# Patient Record
Sex: Female | Born: 1945 | Race: White | Hispanic: No | Marital: Married | Smoking: Current every day smoker
Health system: Southern US, Community
[De-identification: ages and names within clinical notes are randomized; demographics above are authoritative.]

## PROBLEM LIST (undated history)

## (undated) DIAGNOSIS — R188 Other ascites: Secondary | ICD-10-CM

## (undated) DIAGNOSIS — Z8579 Personal history of other malignant neoplasms of lymphoid, hematopoietic and related tissues: Secondary | ICD-10-CM

## (undated) DIAGNOSIS — M1990 Unspecified osteoarthritis, unspecified site: Secondary | ICD-10-CM

## (undated) DIAGNOSIS — J189 Pneumonia, unspecified organism: Secondary | ICD-10-CM

## (undated) DIAGNOSIS — K7460 Unspecified cirrhosis of liver: Secondary | ICD-10-CM

## (undated) DIAGNOSIS — T4145XA Adverse effect of unspecified anesthetic, initial encounter: Secondary | ICD-10-CM

## (undated) DIAGNOSIS — S32050K Wedge compression fracture of fifth lumbar vertebra, subsequent encounter for fracture with nonunion: Secondary | ICD-10-CM

## (undated) DIAGNOSIS — K8020 Calculus of gallbladder without cholecystitis without obstruction: Secondary | ICD-10-CM

## (undated) DIAGNOSIS — Z86010 Personal history of colonic polyps: Secondary | ICD-10-CM

## (undated) DIAGNOSIS — D369 Benign neoplasm, unspecified site: Secondary | ICD-10-CM

## (undated) DIAGNOSIS — S32000A Wedge compression fracture of unspecified lumbar vertebra, initial encounter for closed fracture: Secondary | ICD-10-CM

## (undated) DIAGNOSIS — D539 Nutritional anemia, unspecified: Secondary | ICD-10-CM

## (undated) DIAGNOSIS — I700 Atherosclerosis of aorta: Secondary | ICD-10-CM

## (undated) DIAGNOSIS — M810 Age-related osteoporosis without current pathological fracture: Secondary | ICD-10-CM

## (undated) DIAGNOSIS — Z720 Tobacco use: Secondary | ICD-10-CM

## (undated) DIAGNOSIS — M4850XA Collapsed vertebra, not elsewhere classified, site unspecified, initial encounter for fracture: Secondary | ICD-10-CM

## (undated) DIAGNOSIS — F1010 Alcohol abuse, uncomplicated: Secondary | ICD-10-CM

## (undated) DIAGNOSIS — T8859XA Other complications of anesthesia, initial encounter: Secondary | ICD-10-CM

## (undated) DIAGNOSIS — E871 Hypo-osmolality and hyponatremia: Secondary | ICD-10-CM

## (undated) DIAGNOSIS — J439 Emphysema, unspecified: Secondary | ICD-10-CM

## (undated) DIAGNOSIS — J982 Interstitial emphysema: Secondary | ICD-10-CM

## (undated) HISTORY — DX: Personal history of colonic polyps: Z86.010

## (undated) HISTORY — DX: Personal history of other malignant neoplasms of lymphoid, hematopoietic and related tissues: Z85.79

## (undated) HISTORY — PX: TONSILLECTOMY: SUR1361

## (undated) HISTORY — PX: MANDIBLE FRACTURE SURGERY: SHX706

## (undated) HISTORY — DX: Tobacco use: Z72.0

## (undated) HISTORY — DX: Hypo-osmolality and hyponatremia: E87.1

## (undated) HISTORY — DX: Wedge compression fracture of fifth lumbar vertebra, subsequent encounter for fracture with nonunion: S32.050K

## (undated) HISTORY — DX: Benign neoplasm, unspecified site: D36.9

## (undated) HISTORY — DX: Collapsed vertebra, not elsewhere classified, site unspecified, initial encounter for fracture: M48.50XA

## (undated) HISTORY — DX: Interstitial emphysema: J98.2

---

## 2000-01-16 ENCOUNTER — Encounter: Payer: Self-pay | Admitting: Internal Medicine

## 2000-01-16 ENCOUNTER — Ambulatory Visit (HOSPITAL_COMMUNITY): Admission: RE | Admit: 2000-01-16 | Discharge: 2000-01-16 | Payer: Self-pay | Admitting: Internal Medicine

## 2001-02-28 ENCOUNTER — Other Ambulatory Visit: Admission: RE | Admit: 2001-02-28 | Discharge: 2001-03-19 | Payer: Self-pay

## 2003-09-18 ENCOUNTER — Other Ambulatory Visit: Admission: RE | Admit: 2003-09-18 | Discharge: 2003-09-18 | Payer: Self-pay | Admitting: Obstetrics and Gynecology

## 2003-09-21 ENCOUNTER — Encounter: Admission: RE | Admit: 2003-09-21 | Discharge: 2003-09-21 | Payer: Self-pay | Admitting: Obstetrics and Gynecology

## 2003-10-18 ENCOUNTER — Other Ambulatory Visit: Admission: RE | Admit: 2003-10-18 | Discharge: 2003-10-18 | Payer: Self-pay | Admitting: Obstetrics and Gynecology

## 2004-09-23 ENCOUNTER — Other Ambulatory Visit: Admission: RE | Admit: 2004-09-23 | Discharge: 2004-09-23 | Payer: Self-pay | Admitting: Obstetrics and Gynecology

## 2004-10-02 ENCOUNTER — Encounter: Admission: RE | Admit: 2004-10-02 | Discharge: 2004-10-02 | Payer: Self-pay | Admitting: Obstetrics and Gynecology

## 2005-04-29 ENCOUNTER — Other Ambulatory Visit: Admission: RE | Admit: 2005-04-29 | Discharge: 2005-04-29 | Payer: Self-pay | Admitting: Obstetrics and Gynecology

## 2006-04-27 ENCOUNTER — Other Ambulatory Visit: Admission: RE | Admit: 2006-04-27 | Discharge: 2006-04-27 | Payer: Self-pay | Admitting: Obstetrics and Gynecology

## 2006-12-08 ENCOUNTER — Other Ambulatory Visit: Admission: RE | Admit: 2006-12-08 | Discharge: 2006-12-08 | Payer: Self-pay | Admitting: Obstetrics and Gynecology

## 2007-09-14 ENCOUNTER — Other Ambulatory Visit: Admission: RE | Admit: 2007-09-14 | Discharge: 2007-09-14 | Payer: Self-pay | Admitting: Obstetrics and Gynecology

## 2009-05-22 ENCOUNTER — Encounter: Admission: RE | Admit: 2009-05-22 | Discharge: 2009-05-22 | Payer: Self-pay | Admitting: Obstetrics and Gynecology

## 2014-04-26 DIAGNOSIS — J189 Pneumonia, unspecified organism: Secondary | ICD-10-CM

## 2014-04-26 HISTORY — DX: Pneumonia, unspecified organism: J18.9

## 2014-05-09 DIAGNOSIS — Z1329 Encounter for screening for other suspected endocrine disorder: Secondary | ICD-10-CM | POA: Diagnosis not present

## 2014-05-09 DIAGNOSIS — E559 Vitamin D deficiency, unspecified: Secondary | ICD-10-CM | POA: Diagnosis not present

## 2014-05-09 DIAGNOSIS — J209 Acute bronchitis, unspecified: Secondary | ICD-10-CM | POA: Diagnosis not present

## 2014-05-09 DIAGNOSIS — K7689 Other specified diseases of liver: Secondary | ICD-10-CM | POA: Diagnosis not present

## 2014-05-09 DIAGNOSIS — Z136 Encounter for screening for cardiovascular disorders: Secondary | ICD-10-CM | POA: Diagnosis not present

## 2014-05-09 DIAGNOSIS — Z780 Asymptomatic menopausal state: Secondary | ICD-10-CM | POA: Diagnosis not present

## 2014-05-09 DIAGNOSIS — Z0001 Encounter for general adult medical examination with abnormal findings: Secondary | ICD-10-CM | POA: Diagnosis not present

## 2014-05-09 DIAGNOSIS — Z23 Encounter for immunization: Secondary | ICD-10-CM | POA: Diagnosis not present

## 2014-05-14 ENCOUNTER — Inpatient Hospital Stay (HOSPITAL_COMMUNITY): Payer: Medicare Other

## 2014-05-14 ENCOUNTER — Other Ambulatory Visit: Payer: Self-pay | Admitting: *Deleted

## 2014-05-14 ENCOUNTER — Emergency Department (HOSPITAL_COMMUNITY): Payer: Medicare Other

## 2014-05-14 ENCOUNTER — Encounter (HOSPITAL_COMMUNITY): Payer: Self-pay | Admitting: Emergency Medicine

## 2014-05-14 ENCOUNTER — Inpatient Hospital Stay (HOSPITAL_COMMUNITY)
Admission: EM | Admit: 2014-05-14 | Discharge: 2014-05-25 | DRG: 987 | Disposition: A | Discharge status: Home / Self Care | Payer: Medicare Other | Attending: Internal Medicine | Admitting: Internal Medicine

## 2014-05-14 DIAGNOSIS — E43 Unspecified severe protein-calorie malnutrition: Secondary | ICD-10-CM | POA: Diagnosis not present

## 2014-05-14 DIAGNOSIS — K766 Portal hypertension: Secondary | ICD-10-CM | POA: Diagnosis present

## 2014-05-14 DIAGNOSIS — R933 Abnormal findings on diagnostic imaging of other parts of digestive tract: Secondary | ICD-10-CM | POA: Diagnosis not present

## 2014-05-14 DIAGNOSIS — F1010 Alcohol abuse, uncomplicated: Secondary | ICD-10-CM | POA: Diagnosis present

## 2014-05-14 DIAGNOSIS — J439 Emphysema, unspecified: Secondary | ICD-10-CM | POA: Diagnosis not present

## 2014-05-14 DIAGNOSIS — Z720 Tobacco use: Secondary | ICD-10-CM | POA: Diagnosis present

## 2014-05-14 DIAGNOSIS — K7011 Alcoholic hepatitis with ascites: Secondary | ICD-10-CM | POA: Diagnosis present

## 2014-05-14 DIAGNOSIS — Z780 Asymptomatic menopausal state: Secondary | ICD-10-CM

## 2014-05-14 DIAGNOSIS — R7989 Other specified abnormal findings of blood chemistry: Secondary | ICD-10-CM | POA: Diagnosis not present

## 2014-05-14 DIAGNOSIS — K6389 Other specified diseases of intestine: Secondary | ICD-10-CM | POA: Diagnosis not present

## 2014-05-14 DIAGNOSIS — K621 Rectal polyp: Secondary | ICD-10-CM | POA: Diagnosis not present

## 2014-05-14 DIAGNOSIS — J189 Pneumonia, unspecified organism: Principal | ICD-10-CM | POA: Diagnosis present

## 2014-05-14 DIAGNOSIS — E860 Dehydration: Secondary | ICD-10-CM | POA: Diagnosis present

## 2014-05-14 DIAGNOSIS — J449 Chronic obstructive pulmonary disease, unspecified: Secondary | ICD-10-CM | POA: Diagnosis present

## 2014-05-14 DIAGNOSIS — M810 Age-related osteoporosis without current pathological fracture: Secondary | ICD-10-CM | POA: Diagnosis present

## 2014-05-14 DIAGNOSIS — K5730 Diverticulosis of large intestine without perforation or abscess without bleeding: Secondary | ICD-10-CM | POA: Diagnosis present

## 2014-05-14 DIAGNOSIS — J438 Other emphysema: Secondary | ICD-10-CM

## 2014-05-14 DIAGNOSIS — K7031 Alcoholic cirrhosis of liver with ascites: Secondary | ICD-10-CM | POA: Diagnosis not present

## 2014-05-14 DIAGNOSIS — D500 Iron deficiency anemia secondary to blood loss (chronic): Secondary | ICD-10-CM | POA: Diagnosis present

## 2014-05-14 DIAGNOSIS — D638 Anemia in other chronic diseases classified elsewhere: Secondary | ICD-10-CM | POA: Diagnosis present

## 2014-05-14 DIAGNOSIS — K7030 Alcoholic cirrhosis of liver without ascites: Secondary | ICD-10-CM | POA: Diagnosis present

## 2014-05-14 DIAGNOSIS — I2510 Atherosclerotic heart disease of native coronary artery without angina pectoris: Secondary | ICD-10-CM | POA: Diagnosis not present

## 2014-05-14 DIAGNOSIS — R05 Cough: Secondary | ICD-10-CM | POA: Diagnosis not present

## 2014-05-14 DIAGNOSIS — J440 Chronic obstructive pulmonary disease with acute lower respiratory infection: Secondary | ICD-10-CM | POA: Diagnosis not present

## 2014-05-14 DIAGNOSIS — R0602 Shortness of breath: Secondary | ICD-10-CM | POA: Diagnosis not present

## 2014-05-14 DIAGNOSIS — E876 Hypokalemia: Secondary | ICD-10-CM | POA: Diagnosis present

## 2014-05-14 DIAGNOSIS — J441 Chronic obstructive pulmonary disease with (acute) exacerbation: Secondary | ICD-10-CM | POA: Diagnosis present

## 2014-05-14 DIAGNOSIS — J984 Other disorders of lung: Secondary | ICD-10-CM | POA: Diagnosis not present

## 2014-05-14 DIAGNOSIS — R601 Generalized edema: Secondary | ICD-10-CM | POA: Diagnosis present

## 2014-05-14 DIAGNOSIS — E8770 Fluid overload, unspecified: Secondary | ICD-10-CM | POA: Diagnosis present

## 2014-05-14 DIAGNOSIS — E871 Hypo-osmolality and hyponatremia: Secondary | ICD-10-CM | POA: Diagnosis present

## 2014-05-14 DIAGNOSIS — R188 Other ascites: Secondary | ICD-10-CM

## 2014-05-14 DIAGNOSIS — R748 Abnormal levels of other serum enzymes: Secondary | ICD-10-CM | POA: Diagnosis not present

## 2014-05-14 DIAGNOSIS — D128 Benign neoplasm of rectum: Secondary | ICD-10-CM | POA: Diagnosis not present

## 2014-05-14 DIAGNOSIS — D62 Acute posthemorrhagic anemia: Secondary | ICD-10-CM | POA: Diagnosis present

## 2014-05-14 DIAGNOSIS — N3289 Other specified disorders of bladder: Secondary | ICD-10-CM | POA: Diagnosis not present

## 2014-05-14 DIAGNOSIS — J90 Pleural effusion, not elsewhere classified: Secondary | ICD-10-CM | POA: Diagnosis present

## 2014-05-14 DIAGNOSIS — R609 Edema, unspecified: Secondary | ICD-10-CM | POA: Diagnosis not present

## 2014-05-14 DIAGNOSIS — I519 Heart disease, unspecified: Secondary | ICD-10-CM | POA: Diagnosis not present

## 2014-05-14 DIAGNOSIS — I8510 Secondary esophageal varices without bleeding: Secondary | ICD-10-CM | POA: Diagnosis present

## 2014-05-14 DIAGNOSIS — F17210 Nicotine dependence, cigarettes, uncomplicated: Secondary | ICD-10-CM | POA: Diagnosis present

## 2014-05-14 DIAGNOSIS — R945 Abnormal results of liver function studies: Secondary | ICD-10-CM

## 2014-05-14 DIAGNOSIS — Z452 Encounter for adjustment and management of vascular access device: Secondary | ICD-10-CM | POA: Diagnosis not present

## 2014-05-14 DIAGNOSIS — K8020 Calculus of gallbladder without cholecystitis without obstruction: Secondary | ICD-10-CM | POA: Diagnosis not present

## 2014-05-14 DIAGNOSIS — J9811 Atelectasis: Secondary | ICD-10-CM | POA: Diagnosis not present

## 2014-05-14 DIAGNOSIS — K5790 Diverticulosis of intestine, part unspecified, without perforation or abscess without bleeding: Secondary | ICD-10-CM | POA: Diagnosis not present

## 2014-05-14 DIAGNOSIS — K740 Hepatic fibrosis: Secondary | ICD-10-CM | POA: Diagnosis not present

## 2014-05-14 DIAGNOSIS — D125 Benign neoplasm of sigmoid colon: Secondary | ICD-10-CM | POA: Diagnosis present

## 2014-05-14 DIAGNOSIS — J9601 Acute respiratory failure with hypoxia: Secondary | ICD-10-CM | POA: Diagnosis not present

## 2014-05-14 DIAGNOSIS — D72829 Elevated white blood cell count, unspecified: Secondary | ICD-10-CM | POA: Diagnosis not present

## 2014-05-14 DIAGNOSIS — R062 Wheezing: Secondary | ICD-10-CM | POA: Diagnosis not present

## 2014-05-14 DIAGNOSIS — R404 Transient alteration of awareness: Secondary | ICD-10-CM | POA: Diagnosis not present

## 2014-05-14 HISTORY — DX: Emphysema, unspecified: J43.9

## 2014-05-14 HISTORY — DX: Calculus of gallbladder without cholecystitis without obstruction: K80.20

## 2014-05-14 HISTORY — DX: Other ascites: R18.8

## 2014-05-14 HISTORY — DX: Age-related osteoporosis without current pathological fracture: M81.0

## 2014-05-14 HISTORY — DX: Generalized edema: R60.1

## 2014-05-14 HISTORY — DX: Atherosclerosis of aorta: I70.0

## 2014-05-14 HISTORY — DX: Wedge compression fracture of unspecified lumbar vertebra, initial encounter for closed fracture: S32.000A

## 2014-05-14 HISTORY — DX: Alcohol abuse, uncomplicated: F10.10

## 2014-05-14 HISTORY — DX: Nutritional anemia, unspecified: D53.9

## 2014-05-14 HISTORY — DX: Unspecified cirrhosis of liver: K74.60

## 2014-05-14 LAB — BASIC METABOLIC PANEL
Anion gap: 8 (ref 5–15)
Anion gap: 9 (ref 5–15)
BUN: 7 mg/dL (ref 6–23)
BUN: 7 mg/dL (ref 6–23)
CALCIUM: 7.5 mg/dL — AB (ref 8.4–10.5)
CO2: 38 mEq/L — ABNORMAL HIGH (ref 19–32)
CO2: 37 mEq/L — ABNORMAL HIGH (ref 19–32)
CREATININE: 0.39 mg/dL — AB (ref 0.50–1.10)
CREATININE: 0.34 mg/dL — AB (ref 0.50–1.10)
Calcium: 7.7 mg/dL — ABNORMAL LOW (ref 8.4–10.5)
Chloride: 65 mEq/L — ABNORMAL LOW (ref 96–112)
Chloride: 65 mEq/L — ABNORMAL LOW (ref 96–112)
GFR calc Af Amer: >90 mL/min (ref >90)
GLUCOSE: 132 mg/dL — AB (ref 70–99)
Glucose, Bld: 119 mg/dL — ABNORMAL HIGH (ref 70–99)
Potassium: 2.5 mEq/L — CL (ref 3.7–5.3)
Potassium: 2.4 mEq/L — CL (ref 3.7–5.3)
Sodium: 111 mEq/L — CL (ref 137–147)
Sodium: 111 mEq/L — CL (ref 137–147)

## 2014-05-14 LAB — CBC WITH DIFFERENTIAL/PLATELET
Basophils Absolute: 0 10*3/uL (ref 0.0–0.1)
Basophils Relative: 0 % (ref 0–1)
EOS ABS: 0 10*3/uL (ref 0.0–0.7)
Eosinophils Relative: 0 % (ref 0–5)
HEMATOCRIT: 30.5 % — AB (ref 36.0–46.0)
HEMOGLOBIN: 11 g/dL — AB (ref 12.0–15.0)
LYMPHS ABS: 0.6 10*3/uL — AB (ref 0.7–4.0)
Lymphocytes Relative: 3 % — ABNORMAL LOW (ref 12–46)
MCH: 37 pg — AB (ref 26.0–34.0)
MCHC: 36.1 g/dL — AB (ref 30.0–36.0)
MCV: 102.7 fL — AB (ref 78.0–100.0)
MONO ABS: 0.7 10*3/uL (ref 0.1–1.0)
MONOS PCT: 3 % (ref 3–12)
NEUTROS PCT: 94 % — AB (ref 43–77)
Neutro Abs: 20.4 10*3/uL — ABNORMAL HIGH (ref 1.7–7.7)
Platelets: 452 10*3/uL — ABNORMAL HIGH (ref 150–400)
RBC: 2.97 MIL/uL — AB (ref 3.87–5.11)
RDW: 13 % (ref 11.5–15.5)
WBC: 21.7 10*3/uL — ABNORMAL HIGH (ref 4.0–10.5)

## 2014-05-14 LAB — URINALYSIS, ROUTINE W REFLEX MICROSCOPIC
BILIRUBIN URINE: NEGATIVE
Glucose, UA: NEGATIVE mg/dL
HGB URINE DIPSTICK: NEGATIVE
Ketones, ur: NEGATIVE mg/dL
Leukocytes, UA: NEGATIVE
Nitrite: NEGATIVE
PROTEIN: NEGATIVE mg/dL
Specific Gravity, Urine: 1.026 (ref 1.005–1.030)
Urobilinogen, UA: 4 mg/dL — ABNORMAL HIGH (ref 0.0–1.0)
pH: 8 (ref 5.0–8.0)

## 2014-05-14 LAB — PRO B NATRIURETIC PEPTIDE: Pro B Natriuretic peptide (BNP): 297.9 pg/mL — ABNORMAL HIGH (ref 0–125)

## 2014-05-14 LAB — HEPATIC FUNCTION PANEL
ALBUMIN: 1.7 g/dL — AB (ref 3.5–5.2)
ALT: 27 U/L (ref 0–35)
AST: 117 U/L — ABNORMAL HIGH (ref 0–37)
Alkaline Phosphatase: 225 U/L — ABNORMAL HIGH (ref 39–117)
BILIRUBIN TOTAL: 0.5 mg/dL (ref 0.3–1.2)
Bilirubin, Direct: 0.2 mg/dL (ref 0.0–0.3)
Indirect Bilirubin: 0.3 mg/dL (ref 0.3–0.9)
Total Protein: 5.9 g/dL — ABNORMAL LOW (ref 6.0–8.3)

## 2014-05-14 LAB — MRSA PCR SCREENING: MRSA by PCR: NEGATIVE

## 2014-05-14 LAB — I-STAT CG4 LACTIC ACID, ED: Lactic Acid, Venous: 2.05 mmol/L (ref 0.5–2.2)

## 2014-05-14 LAB — PROTIME-INR
INR: 1.12 (ref 0.00–1.49)
PROTHROMBIN TIME: 14.5 s (ref 11.6–15.2)

## 2014-05-14 MED ORDER — LORAZEPAM 2 MG/ML IJ SOLN: 1.0000 mg | Freq: Four times a day (QID) | INTRAMUSCULAR | Status: AC | PRN

## 2014-05-14 MED ORDER — ENOXAPARIN SODIUM 30 MG/0.3ML ~~LOC~~ SOLN
30.0000 mg | SUBCUTANEOUS | Status: DC
  Administered 2014-05-14 – 2014-05-15 (×2): 30 mg via SUBCUTANEOUS
  Filled 2014-05-14 (×2): qty 0.3

## 2014-05-14 MED ORDER — THIAMINE HCL 100 MG/ML IJ SOLN
100.0000 mg | Freq: Every day | INTRAMUSCULAR | Status: DC
  Filled 2014-05-14 (×2): qty 1
  Filled 2014-05-14: qty 2
  Filled 2014-05-14 (×4): qty 1

## 2014-05-14 MED ORDER — ACETAMINOPHEN 325 MG PO TABS
650.0000 mg | ORAL_TABLET | Freq: Four times a day (QID) | ORAL | Status: DC | PRN
  Administered 2014-05-15 – 2014-05-24 (×7): 650 mg via ORAL
  Filled 2014-05-14 (×7): qty 2

## 2014-05-14 MED ORDER — SODIUM CHLORIDE 0.9 % IJ SOLN: 3.0000 mL | INTRAMUSCULAR | Status: DC | PRN

## 2014-05-14 MED ORDER — ADULT MULTIVITAMIN W/MINERALS CH
1.0000 | ORAL_TABLET | Freq: Every day | ORAL | Status: DC
  Administered 2014-05-14 – 2014-05-25 (×12): 1 via ORAL
  Filled 2014-05-14 (×12): qty 1

## 2014-05-14 MED ORDER — ACETAMINOPHEN 650 MG RE SUPP: 650.0000 mg | Freq: Four times a day (QID) | RECTAL | Status: DC | PRN

## 2014-05-14 MED ORDER — DEXTROSE 5 % IV SOLN
500.0000 mg | Freq: Once | INTRAVENOUS | Status: DC
  Filled 2014-05-14: qty 500

## 2014-05-14 MED ORDER — FOLIC ACID 1 MG PO TABS
1.0000 mg | ORAL_TABLET | Freq: Every day | ORAL | Status: DC
  Administered 2014-05-14 – 2014-05-25 (×12): 1 mg via ORAL
  Filled 2014-05-14 (×12): qty 1

## 2014-05-14 MED ORDER — IPRATROPIUM-ALBUTEROL 0.5-2.5 (3) MG/3ML IN SOLN
3.0000 mL | Freq: Four times a day (QID) | RESPIRATORY_TRACT | Status: DC
  Administered 2014-05-14 – 2014-05-15 (×3): 3 mL via RESPIRATORY_TRACT
  Filled 2014-05-14 (×3): qty 3

## 2014-05-14 MED ORDER — POTASSIUM CHLORIDE 10 MEQ/100ML IV SOLN
10.0000 meq | INTRAVENOUS | Status: AC
  Administered 2014-05-14 – 2014-05-15 (×4): 10 meq via INTRAVENOUS
  Filled 2014-05-14: qty 100

## 2014-05-14 MED ORDER — DEXTROSE 5 % IV SOLN
1.0000 g | Freq: Once | INTRAVENOUS | Status: AC
  Administered 2014-05-14: 1 g via INTRAVENOUS
  Filled 2014-05-14: qty 10

## 2014-05-14 MED ORDER — FUROSEMIDE 10 MG/ML IJ SOLN
20.0000 mg | Freq: Three times a day (TID) | INTRAMUSCULAR | Status: DC
Start: 2014-05-14 — End: 2014-05-16
  Administered 2014-05-14 – 2014-05-16 (×5): 20 mg via INTRAVENOUS
  Filled 2014-05-14 (×5): qty 2

## 2014-05-14 MED ORDER — OXYCODONE HCL 5 MG PO TABS
5.0000 mg | ORAL_TABLET | ORAL | Status: DC | PRN
Start: 2014-05-14 — End: 2014-05-25

## 2014-05-14 MED ORDER — ONDANSETRON HCL 4 MG/2ML IJ SOLN: 4.0000 mg | Freq: Four times a day (QID) | INTRAMUSCULAR | Status: DC | PRN

## 2014-05-14 MED ORDER — SODIUM CHLORIDE 0.9 % IV SOLN: 250.0000 mL | INTRAVENOUS | Status: DC | PRN

## 2014-05-14 MED ORDER — IOHEXOL 300 MG/ML  SOLN
100.0000 mL | Freq: Once | INTRAMUSCULAR | Status: AC | PRN
  Administered 2014-05-14: 100 mL via INTRAVENOUS

## 2014-05-14 MED ORDER — SODIUM CHLORIDE 0.9 % IV SOLN
INTRAVENOUS | Status: DC
  Administered 2014-05-14: 18:00:00 via INTRAVENOUS

## 2014-05-14 MED ORDER — LORAZEPAM 1 MG PO TABS: 1.0000 mg | ORAL_TABLET | Freq: Four times a day (QID) | ORAL | Status: AC | PRN

## 2014-05-14 MED ORDER — GUAIFENESIN-DM 100-10 MG/5ML PO SYRP: 5.0000 mL | ORAL_SOLUTION | ORAL | Status: DC | PRN

## 2014-05-14 MED ORDER — ONDANSETRON HCL 4 MG PO TABS: 4.0000 mg | ORAL_TABLET | Freq: Four times a day (QID) | ORAL | Status: DC | PRN

## 2014-05-14 MED ORDER — VITAMIN B-1 100 MG PO TABS
100.0000 mg | ORAL_TABLET | Freq: Every day | ORAL | Status: DC
  Administered 2014-05-14 – 2014-05-25 (×12): 100 mg via ORAL
  Filled 2014-05-14 (×12): qty 1

## 2014-05-14 MED ORDER — SODIUM CHLORIDE 0.9 % IJ SOLN
3.0000 mL | Freq: Two times a day (BID) | INTRAMUSCULAR | Status: DC
  Administered 2014-05-14 – 2014-05-17 (×3): 3 mL via INTRAVENOUS

## 2014-05-14 MED ORDER — ALBUTEROL SULFATE (2.5 MG/3ML) 0.083% IN NEBU: 2.5000 mg | INHALATION_SOLUTION | RESPIRATORY_TRACT | Status: DC | PRN

## 2014-05-14 MED ORDER — MORPHINE SULFATE 2 MG/ML IJ SOLN: 1.0000 mg | INTRAMUSCULAR | Status: DC | PRN

## 2014-05-14 NOTE — ED Notes (Signed)
Pt placed on 2L of oxygen. Sat around 90% before O2 applied. After O2, 96%.

## 2014-05-14 NOTE — ED Notes (Signed)
Attempted to called report, RN unable to take report at this time.

## 2014-05-14 NOTE — H&P (Addendum)
PATIENT DETAILS Name: Cassidy Barrett Age: 68 y.o. Sex: female Date of Birth: [birth year] Admit Date: 05/14/2014 MWN:[id number], Luane School, NP   CHIEF COMPLAINT:  Sent from PCPs office for evaluation of possible pneumonia, electrolyte abnormalities   HPI: Cassidy Barrett is a 68 y.o. female with a Past Medical History of ongoing tobacco abuse, EtOH use who presents today with the above noted complaint. Approximately 3 weeks back, patient had cough/cold like symptoms, following which she had loose stool (but not diarrhea- as only once a day). Also started getting weak, with significantly decreased appetite. Patient has been trying to keep her appetite up with Gatorade, gingerale and water- approximate intake of around 2.5-3 L a day (plus beer).There is no history of nausea vomiting. She went to her primary care practitioner's office on 10/14, and was found to be wheezing, and was started on Zithromax and prednisone taper. A chest x-ray done on 10/14 also showed right-sided pleural effusion, and a questionable right-sided mass. Electrolytes done on 10/14 showed a sodium level of 125. She continued to have weakness, cough, she was referred to the emergency room for further evaluation and treatment. X-ray of the chest done on 10/19 shows right-sided pleural effusion, last seen on 10/19 in the emergency room showed a sodium of 111, potassium of 2.5. I was subsequently asked to admit this patient for further evaluation and treatment. Patient denies any history of fever at home. She does have chronic cough, but her current cough is worse than her usual cough. Family has noted, some mild leg edema, and some sacral edema/lower back edema as well. Per ED RN, patient has had approximately 200 cc of IV fluids, and a bag of IV Rocephin while in the emergency room.   ALLERGIES:  No Known Allergies  PAST MEDICAL HISTORY: Past Medical History  Diagnosis Date  . Osteoporosis     PAST SURGICAL  HISTORY: Past Surgical History  Procedure Laterality Date  . Tonsillectomy      MEDICATIONS AT HOME: Prior to Admission medications   Medication Sig Start Date End Date Taking? Authorizing Provider  albuterol (PROVENTIL HFA;VENTOLIN HFA) 108 (90 BASE) MCG/ACT inhaler Inhale 2 puffs into the lungs every 4 (four) hours as needed for wheezing or shortness of breath.   Yes Historical Provider, MD  ibuprofen (ADVIL,MOTRIN) 200 MG tablet Take 400 mg by mouth every 6 (six) hours as needed for headache or moderate pain.   Yes Historical Provider, MD  predniSONE (DELTASONE) 20 MG tablet Take 60 mg by mouth daily. For 6 days, 05-14-14 is day 5   Yes Historical Provider, MD  Vitamin D, Ergocalciferol, (DRISDOL) 50000 UNITS CAPS capsule Take 50,000 Units by mouth every 7 (seven) days. Takes on wednesdays   Yes Historical Provider, MD    FAMILY HISTORY: History reviewed. No pertinent family history.  SOCIAL HISTORY:  reports that she has been smoking.  She does not have any smokeless tobacco history on file. She reports that she drinks alcohol (normally approximately 5 bottles of beer/day, along with a small drink of liquor at night). She reports that she does not use illicit drugs.  REVIEW OF SYSTEMS:  Constitutional:   No  weight loss, night sweats,  Fevers, chills, fatigue.  HEENT:    No headaches, Difficulty swallowing,Tooth/dental problems.  Cardio-vascular: No chest pain,  Orthopnea, PND,  dizziness, palpitations  GI:  No heartburn, indigestion, abdominal pain, nausea, vomiting, diarrhea, change in  bowel habits.  Resp: No shortness  of breath with exertion or at rest.   No coughing up of blood.No chest wall deformity  Skin:  no rash or lesions.  GU:  no dysuria, change in color of urine, no urgency or frequency.  No flank pain.  Musculoskeletal: No joint pain or swelling.  No decreased range of motion.  No back pain.  Psych: No change in mood or affect. No depression or  anxiety.  No memory loss.   PHYSICAL EXAM: Blood pressure 137/76, pulse 90, temperature 98.3 F (36.8 C), temperature source Oral, resp. rate 18, SpO2 100.00%.  General appearance :Awake, alert, not in any distress. Speech Clear. Not toxic Looking. HEENT: Atraumatic and Normocephalic, pupils equally reactive to light and accomodation. Moist oral mucosa Neck: supple, no JVD. No cervical lymphadenopathy.  Chest:Good air entry bilaterally, scattered rhonchi all over CVS: S1 S2 regular, no murmurs.  Abdomen: Bowel sounds present, Non tender and not distended (but dullness in flanks) with no gaurding, rigidity or rebound. + Pitting sacral/lower back edema Extremities: B/L Lower Ext shows 1-2+ edema, both legs are warm to touch Neurology: Awake alert, and oriented X 3, CN II-XII intact, Non focal Skin:No Rash Wounds:N/A  LABS ON ADMISSION:   Recent Labs  05/14/14 1732  NA 111*  K 2.5*  CL 65*  CO2 38*  GLUCOSE 132*  BUN 7  CREATININE 0.39*  CALCIUM 7.7*   No results found for this basename: AST, ALT, ALKPHOS, BILITOT, PROT, ALBUMIN,  in the last 72 hours No results found for this basename: LIPASE, AMYLASE,  in the last 72 hours  Recent Labs  05/14/14 1732  WBC 21.7*  NEUTROABS 20.4*  HGB 11.0*  HCT 30.5*  MCV 102.7*  PLT 452*   No results found for this basename: CKTOTAL, CKMB, CKMBINDEX, TROPONINI,  in the last 72 hours No results found for this basename: DDIMER,  in the last 72 hours No components found with this basename: POCBNP,    RADIOLOGIC STUDIES ON ADMISSION: Dg Chest 2 View  05/14/2014   CLINICAL DATA:  Initial encounter for pneumonia and productive cough.  EXAM: CHEST  2 VIEW  COMPARISON:  No comparison studies available.  FINDINGS: Two views study shows hyperexpansion suggesting emphysema. There is a small right pleural effusion with associated tiny left pleural effusion. Minimal dependent atelectasis is noted bilaterally and airspace infection cannot be  excluded at the right base. The cardiopericardial silhouette is within normal limits for size. Bones are diffusely demineralized.  IMPRESSION: Emphysema with small, right greater than left pleural effusions.  Bibasilar airspace disease is likely atelectatic although superimposed infection at the right base would be difficult to exclude.   Electronically Signed   By: Misty Stanley M.D.   On: 05/14/2014 18:08    ASSESSMENT AND PLAN: Present on Admission:  . Hyponatremia: Suspected hypervolemic hyponatremia.Etiology not evident at this time, however given history of long-standing alcohol use, possible that she could have Cirrhoses. Given pleural effusion, and history of smoking-malignancy definitely in the differential as well. Suspect hyponatremia worsened by excessive fluid/beer intake. Will check LFTs, UA, and Echo. Serum/urine osmolality pending. Stop all IVF, and place on 1200 cc fluid restriction. Await further work up, including repeat BMET-will then start IV Lasix.Currently completely asymptomatic-is awake/alert X 4!  Addendum: Case d/w Renal-Dr Marcy Siren advised Lasix 20 mg IV Q8hrs, he will evaluate in am  . Right sided Pleural Effusion:?recent hx of PNA-could be parapneumonic. However with history of smoking, malignancy definitely possible (could explain hyponatremia). However could have pleural effusion  related to liver disease as well. Check CT chest/abdomen, await UA and echocardiogram. May need diagnostic thoracocentesis at some point. No fever, monitor off antibiotics.  . Hypokalemia: Replete and recheck. ?secondary to poor oral intake.  . Tobacco abuse:counseled  . Alcohol abuse: Normally drinks around 5 -12 oz bottles of beer/daily, along with liquor.For the past 2-3 weeks,she has cut down to just 2 bottles a day.No signs of withdrawal. Ativan per CIWA protocol.   Marland Kitchen COPD (chronic obstructive pulmonary disease) with mild exacerbation:just completed her steroid taper. Minimal  wheezing, place on scheduled meds for now.  Further plan will depend as patient's clinical course evolves and further radiologic and laboratory data become available. Patient will be monitored closely.  Above noted plan was discussed with patient/spouse, they were in agreement.   DVT Prophylaxis: Prophylactic Lovenox   Code Status: Full Code  Disposition Plan: Home when stable.   Total time spent for admission equals 45 minutes.  [facility] Pager [phone]  If 7PM-7AM, please contact night-coverage [email or web address] Password [email or web address] 05/14/2014, 7:56 PM

## 2014-05-14 NOTE — ED Notes (Signed)
Primary nurse Morey Hummingbird RN and Junie Panning EDPA notified re pt's critical Na+ of 111 and K+ of 2.5

## 2014-05-14 NOTE — ED Notes (Signed)
Hospitalist requested antibiotics be stopped and zithromax not be given.  Reports that patient does not have PNA and is going to get a CT.

## 2014-05-14 NOTE — Progress Notes (Signed)
Report received from ED nurse pt not ready to come will get CT scan prior to transfer

## 2014-05-14 NOTE — ED Notes (Signed)
Attempted to call report to the floor; RN unable to receive report due to shift change.

## 2014-05-14 NOTE — ED Provider Notes (Signed)
CSN: [id number]     Arrival date & time 05/14/14  1527 History   First MD Initiated Contact with Patient 05/14/14 [place]     Chief Complaint  Patient presents with  . Pneumonia     (Consider location/radiation/quality/duration/timing/severity/associated sxs/prior Treatment) Patient is a 68 y.o. female presenting with pneumonia. The history is provided by the patient and the spouse. No language interpreter was used.  Pneumonia This is a new problem. The current episode started 1 to 4 weeks ago. The problem occurs constantly. The problem has been unchanged. Associated symptoms include coughing, fatigue and nausea. Pertinent negatives include no abdominal pain, chest pain, chills, fever or vomiting. The symptoms are aggravated by coughing and exertion. She has tried rest (azithromycin and prednisone) for the symptoms. The treatment provided no relief.   Pt is a 68yo female sent to ED by PCP, Everardo Beals, NP for further evaluation and treatment of CAP.  Pt has been tx outpatient for CAP with azithromycin and prednisone, finished medications yesterday, however, pt found to have "abnomal labs" NP advised pt to be admitted for a PICC line as pt does not appear to be improving, required O2 upon arrival to ED, still has productive cough, SOB, and decreased appetite.  Pt also has had nausea and diarrhea. Last episode of diarrhea on Saturday, 10/17.  No hx of COPD or asthma.   Past Medical History  Diagnosis Date  . Osteoporosis    Past Surgical History  Procedure Laterality Date  . Tonsillectomy     History reviewed. No pertinent family history. History  Substance Use Topics  . Smoking status: Current Every Day Smoker  . Smokeless tobacco: Not on file  . Alcohol Use: Yes   OB History   Grav Para Term Preterm Abortions TAB SAB Ect Mult Living                 Review of Systems  Constitutional: Positive for appetite change and fatigue. Negative for fever and chills.  Respiratory:  Positive for cough. Negative for shortness of breath.   Cardiovascular: Negative for chest pain and palpitations.  Gastrointestinal: Positive for nausea and diarrhea. Negative for vomiting and abdominal pain.  All other systems reviewed and are negative.     Allergies  Review of patient's allergies indicates no known allergies.  Home Medications   Prior to Admission medications   Medication Sig Start Date End Date Taking? Authorizing Provider  albuterol (PROVENTIL HFA;VENTOLIN HFA) 108 (90 BASE) MCG/ACT inhaler Inhale 2 puffs into the lungs every 4 (four) hours as needed for wheezing or shortness of breath.   Yes Historical Provider, MD  ibuprofen (ADVIL,MOTRIN) 200 MG tablet Take 400 mg by mouth every 6 (six) hours as needed for headache or moderate pain.   Yes Historical Provider, MD  predniSONE (DELTASONE) 20 MG tablet Take 60 mg by mouth daily. For 6 days, 05-14-14 is day 5   Yes Historical Provider, MD  Vitamin D, Ergocalciferol, (DRISDOL) 50000 UNITS CAPS capsule Take 50,000 Units by mouth every 7 (seven) days. Takes on wednesdays   Yes Historical Provider, MD   BP 132/73  Pulse 86  Temp(Src) 97.6 F (36.4 C) (Oral)  Resp 18  SpO2 100% Physical Exam  Nursing note and vitals reviewed. Constitutional: She is oriented to person, place, and time. No distress.  Thin female lying in exam bed, NAD  HENT:  Head: Normocephalic and atraumatic.  Eyes: Conjunctivae are normal. No scleral icterus.  Neck: Normal range of motion.  Cardiovascular:  Normal rate, regular rhythm and normal heart sounds.   Pulmonary/Chest: Effort normal. No respiratory distress. She has decreased breath sounds in the right lower field. She has no wheezes. She has no rales. She exhibits no tenderness.  Abdominal: Soft. Bowel sounds are normal. She exhibits no distension and no mass. There is no tenderness. There is no rebound and no guarding.  Soft, non-distended, non-tender.  Musculoskeletal: Normal range of  motion.  Neurological: She is alert and oriented to person, place, and time.  Skin: Skin is warm and dry. She is not diaphoretic.    ED Course  Procedures (including critical care time) Labs Review Labs Reviewed  CBC WITH DIFFERENTIAL - Abnormal; Notable for the following:    WBC 21.7 (*)    RBC 2.97 (*)    Hemoglobin 11.0 (*)    HCT 30.5 (*)    MCV 102.7 (*)    MCH 37.0 (*)    MCHC 36.1 (*)    Platelets 452 (*)    Neutrophils Relative % 94 (*)    Neutro Abs 20.4 (*)    Lymphocytes Relative 3 (*)    Lymphs Abs 0.6 (*)    All other components within normal limits  BASIC METABOLIC PANEL - Abnormal; Notable for the following:    Sodium 111 (*)    Potassium 2.5 (*)    Chloride 65 (*)    CO2 38 (*)    Glucose, Bld 132 (*)    Creatinine, Ser 0.39 (*)    Calcium 7.7 (*)    All other components within normal limits  URINE CULTURE  CULTURE, BLOOD (ROUTINE X 2)  CULTURE, BLOOD (ROUTINE X 2)  URINALYSIS, ROUTINE W REFLEX MICROSCOPIC  BASIC METABOLIC PANEL  OSMOLALITY  OSMOLALITY, URINE  SODIUM, URINE, RANDOM  TSH  I-STAT CG4 LACTIC ACID, ED    Imaging Review Dg Chest 2 View  05/14/2014   CLINICAL DATA:  Initial encounter for pneumonia and productive cough.  EXAM: CHEST  2 VIEW  COMPARISON:  No comparison studies available.  FINDINGS: Two views study shows hyperexpansion suggesting emphysema. There is a small right pleural effusion with associated tiny left pleural effusion. Minimal dependent atelectasis is noted bilaterally and airspace infection cannot be excluded at the right base. The cardiopericardial silhouette is within normal limits for size. Bones are diffusely demineralized.  IMPRESSION: Emphysema with small, right greater than left pleural effusions.  Bibasilar airspace disease is likely atelectatic although superimposed infection at the right base would be difficult to exclude.   Electronically Signed   By: Misty Stanley M.D.   On: 05/14/2014 18:08     EKG  Interpretation None      MDM   Final diagnoses:  Community acquired pneumonia  Hyponatremia  Hypokalemia    pt sent to ED by Everardo Beals, NP, for admission for PICC line and pneumonia. Pt does not currently have PICC line, IV was able to be placed by RN in ED.  Pt placed on [place] 2L of O2, pt not normally on O2 at home.  Pt failed outpatient tx with azithromycin and prednisone.   Labs: CBC-leukocytosis-WBC:21.7, BMP- severe hyponatremia at 111, K+: 2.5, pt is alert and oriented but states she feels fatigued.  CXR: emphysema with small, right greater than left pleural effusions.  Bibasilar airspace disease is likely atelectatic although superimposed infection at right base would be difficult to exclude.   Discussed with with Dr. Mingo Amber who consulted with Dr. Sloan Leiter, agreed to admit pt to step-down unit  for further tx of hyponatremia, hypokalemia, and CAP.  6:46 PM pt is stable at this time.     Noland Fordyce, PA-C 05/14/14 [id number]

## 2014-05-14 NOTE — ED Notes (Signed)
Pt sent here from PCP for hospitalization for PICC line and pneumonia. Pt had to be place on oxygen upon arrival. No emesis, last episode diarrhea Saturday. Productive cough in triage.

## 2014-05-15 DIAGNOSIS — E871 Hypo-osmolality and hyponatremia: Secondary | ICD-10-CM

## 2014-05-15 DIAGNOSIS — J189 Pneumonia, unspecified organism: Principal | ICD-10-CM

## 2014-05-15 DIAGNOSIS — R933 Abnormal findings on diagnostic imaging of other parts of digestive tract: Secondary | ICD-10-CM

## 2014-05-15 DIAGNOSIS — I519 Heart disease, unspecified: Secondary | ICD-10-CM

## 2014-05-15 DIAGNOSIS — R062 Wheezing: Secondary | ICD-10-CM

## 2014-05-15 DIAGNOSIS — J439 Emphysema, unspecified: Secondary | ICD-10-CM

## 2014-05-15 LAB — HEPATITIS B SURFACE ANTIGEN: Hepatitis B Surface Ag: NEGATIVE

## 2014-05-15 LAB — BLOOD GAS, ARTERIAL
Acid-Base Excess: 12.5 mmol/L — ABNORMAL HIGH (ref 0.0–2.0)
BICARBONATE: 36.4 meq/L — AB (ref 20.0–24.0)
DRAWN BY: 276051
FIO2: 0.21 %
O2 Saturation: 93 %
PATIENT TEMPERATURE: 98.6
PH ART: 7.544 — AB (ref 7.350–7.450)
TCO2: 32.6 mmol/L (ref 0–100)
pCO2 arterial: 42.3 mmHg (ref 35.0–45.0)
pO2, Arterial: 60.5 mmHg — ABNORMAL LOW (ref 80.0–100.0)

## 2014-05-15 LAB — BASIC METABOLIC PANEL
ANION GAP: 11 (ref 5–15)
ANION GAP: 10 (ref 5–15)
Anion gap: 9 (ref 5–15)
Anion gap: 10 (ref 5–15)
BUN: 6 mg/dL (ref 6–23)
BUN: 5 mg/dL — AB (ref 6–23)
BUN: 5 mg/dL — ABNORMAL LOW (ref 6–23)
BUN: 5 mg/dL — ABNORMAL LOW (ref 6–23)
CALCIUM: 7.6 mg/dL — AB (ref 8.4–10.5)
CHLORIDE: 67 meq/L — AB (ref 96–112)
CHLORIDE: 68 meq/L — AB (ref 96–112)
CO2: 38 meq/L — AB (ref 19–32)
CO2: 36 mEq/L — ABNORMAL HIGH (ref 19–32)
CO2: 35 meq/L — AB (ref 19–32)
CO2: 35 mEq/L — ABNORMAL HIGH (ref 19–32)
CREATININE: 0.39 mg/dL — AB (ref 0.50–1.10)
CREATININE: 0.39 mg/dL — AB (ref 0.50–1.10)
Calcium: 7.7 mg/dL — ABNORMAL LOW (ref 8.4–10.5)
Calcium: 7.9 mg/dL — ABNORMAL LOW (ref 8.4–10.5)
Calcium: 7.7 mg/dL — ABNORMAL LOW (ref 8.4–10.5)
Chloride: 71 mEq/L — ABNORMAL LOW (ref 96–112)
Chloride: 71 mEq/L — ABNORMAL LOW (ref 96–112)
Creatinine, Ser: 0.42 mg/dL — ABNORMAL LOW (ref 0.50–1.10)
Creatinine, Ser: 0.48 mg/dL — ABNORMAL LOW (ref 0.50–1.10)
GFR calc Af Amer: >90 mL/min (ref >90)
GFR calc Af Amer: >90 mL/min (ref >90)
GFR calc Af Amer: >90 mL/min (ref >90)
GFR calc non Af Amer: >90 mL/min (ref >90)
GFR calc non Af Amer: >90 mL/min (ref >90)
GFR calc non Af Amer: >90 mL/min (ref >90)
GFR calc non Af Amer: >90 mL/min (ref >90)
GLUCOSE: 122 mg/dL — AB (ref 70–99)
Glucose, Bld: 73 mg/dL (ref 70–99)
Glucose, Bld: 117 mg/dL — ABNORMAL HIGH (ref 70–99)
Glucose, Bld: 109 mg/dL — ABNORMAL HIGH (ref 70–99)
POTASSIUM: 3.2 meq/L — AB (ref 3.7–5.3)
Potassium: 2.8 mEq/L — CL (ref 3.7–5.3)
Potassium: 3.3 mEq/L — ABNORMAL LOW (ref 3.7–5.3)
Potassium: 3.2 mEq/L — ABNORMAL LOW (ref 3.7–5.3)
SODIUM: 113 meq/L — AB (ref 137–147)
Sodium: 118 mEq/L — CL (ref 137–147)
Sodium: 117 mEq/L — CL (ref 137–147)
Sodium: 113 mEq/L — CL (ref 137–147)

## 2014-05-15 LAB — ETHANOL

## 2014-05-15 LAB — CBC
HCT: 28.7 % — ABNORMAL LOW (ref 36.0–46.0)
Hemoglobin: 10.4 g/dL — ABNORMAL LOW (ref 12.0–15.0)
MCH: 37 pg — ABNORMAL HIGH (ref 26.0–34.0)
MCHC: 36.2 g/dL — ABNORMAL HIGH (ref 30.0–36.0)
MCV: 102.1 fL — AB (ref 78.0–100.0)
Platelets: 356 10*3/uL (ref 150–400)
RBC: 2.81 MIL/uL — ABNORMAL LOW (ref 3.87–5.11)
RDW: 12.9 % (ref 11.5–15.5)
WBC: 20.7 10*3/uL — ABNORMAL HIGH (ref 4.0–10.5)

## 2014-05-15 LAB — CORTISOL: Cortisol, Plasma: 7.2 ug/dL

## 2014-05-15 LAB — TSH: TSH: 2.3 u[IU]/mL (ref 0.350–4.500)

## 2014-05-15 LAB — MAGNESIUM: MAGNESIUM: 1.5 mg/dL (ref 1.5–2.5)

## 2014-05-15 LAB — URINE CULTURE
Colony Count: NO GROWTH
Culture: NO GROWTH

## 2014-05-15 LAB — HEPATITIS C ANTIBODY: HCV AB: NEGATIVE

## 2014-05-15 LAB — OSMOLALITY, URINE: Osmolality, Ur: 269 mOsm/kg — ABNORMAL LOW (ref 390–1090)

## 2014-05-15 LAB — OSMOLALITY: Osmolality: 231 mOsm/kg — CL (ref 275–300)

## 2014-05-15 LAB — CREATININE, URINE, RANDOM: Creatinine, Urine: 44.7 mg/dL

## 2014-05-15 LAB — SODIUM, URINE, RANDOM: Sodium, Ur: 41 mEq/L

## 2014-05-15 MED ORDER — POTASSIUM CHLORIDE 20 MEQ/15ML (10%) PO LIQD: 40.0000 meq | Freq: Once | ORAL | Status: DC

## 2014-05-15 MED ORDER — POTASSIUM CHLORIDE CRYS ER 20 MEQ PO TBCR
40.0000 meq | EXTENDED_RELEASE_TABLET | Freq: Once | ORAL | Status: AC
  Administered 2014-05-15: 40 meq via ORAL
  Filled 2014-05-15: qty 2

## 2014-05-15 MED ORDER — POTASSIUM CHLORIDE 20 MEQ/15ML (10%) PO LIQD
40.0000 meq | Freq: Once | ORAL | Status: AC
  Administered 2014-05-15: 40 meq via ORAL
  Filled 2014-05-15: qty 30

## 2014-05-15 MED ORDER — POTASSIUM CHLORIDE 10 MEQ/100ML IV SOLN
10.0000 meq | INTRAVENOUS | Status: AC
  Administered 2014-05-15 – 2014-05-16 (×3): 10 meq via INTRAVENOUS
  Filled 2014-05-15 (×3): qty 100

## 2014-05-15 MED ORDER — IPRATROPIUM-ALBUTEROL 0.5-2.5 (3) MG/3ML IN SOLN
3.0000 mL | Freq: Three times a day (TID) | RESPIRATORY_TRACT | Status: DC
  Administered 2014-05-15 – 2014-05-17 (×5): 3 mL via RESPIRATORY_TRACT
  Filled 2014-05-15 (×5): qty 3

## 2014-05-15 MED ORDER — LEVOFLOXACIN IN D5W 750 MG/150ML IV SOLN
750.0000 mg | INTRAVENOUS | Status: AC
  Administered 2014-05-15 – 2014-05-21 (×7): 750 mg via INTRAVENOUS
  Filled 2014-05-15 (×7): qty 150

## 2014-05-15 NOTE — ED Provider Notes (Signed)
Medical screening examination/treatment/procedure(s) were conducted as a shared visit with non-physician practitioner(s) and myself.  I personally evaluated the patient during the encounter.   EKG Interpretation None      Patient here from PCP's office for pneumonia. Well-appearing, relaxing comfortably, no respiratory distress. R pleural effusion on CXR. Labs with critical sodium 111, unclear etiology for hyponatremia. Patient admitted.  Evelina Bucy, MD 05/15/14 [phone]

## 2014-05-15 NOTE — Progress Notes (Signed)
  Echocardiogram 2D Echocardiogram has been performed.  Cassidy Barrett 05/15/2014, 12:01 PM

## 2014-05-15 NOTE — Consult Note (Signed)
Referring Provider: Dr. Leisa Lenz Primary Care Physician:  Imelda Pillow, NP Primary Gastroenterologist:  None, unassigned  Reason for Consultation:  Rectal mass on CT scan; cirrhosis  HPI: Cassidy Barrett is a 68 y.o. female with a Past Medical History of ongoing tobacco abuse and ETOH use who presented to [facility] ED on 10/19 for possible PNA and electrolyte abnormalities.  Approximately 3 weeks ago patient had cough/cold like symptoms. Also started getting weak, with significantly decreased appetite. Patient has been trying to keep her appetite up with Gatorade, gingerale, and water- approximate intake of around 2.5-3 L a day (plus beer).There is no history of nausea or vomiting. She went to her primary care practitioner's office on 10/14, and was found to be wheezing, and was started on Zithromax and prednisone taper. A chest x-ray done on 10/14 also showed right-sided pleural effusion, and a questionable right-sided mass. Electrolytes done on 10/14 showed a sodium level of 125. She continued to have weakness, cough, she was referred to the emergency room for further evaluation and treatment. X-ray of the chest done on 10/19 shows right-sided pleural effusion, sodium of 111, potassium of 2.5, leukocytosis of 21.7.  Patient denies any history of fever at home. She does have chronic cough, but her current cough is worse than her usual cough. Family has noted some mild leg edema, and some sacral edema/lower back edema as well.   Patient denies abdominal pain and rectal bleeding.  Says that BM's are regular for the most part, no significant diarrhea or constipation.  Never had colonoscopy in the past.  Appetite has been poor, but denies any recent weight loss; had loss some weight last year after her sister passed away, but weight stabilized after that.  Drinks 2 beers per day for several years; sometimes has a shot of liquor as well but not every day.  Did drink more in the past, particularly last  year when her sister passed away.  CT scan of the abdomen and pelvis with contrast was performed and showed the following:  "IMPRESSION:  1. Moderate layering right pleural effusion with compression of the right lung, but no pneumonia. Small layering left pleural effusion.  2. Ascites, and nodular liver suggesting cirrhosis. No liver mass.  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 Mild porta hepatis lymphadenopathy likely reactive due to chronic liver disease.  3. Suggestion of a 3-4 cm soft tissue mass of the rectum partially narrowing the lumen about 6 cm upstream of the anal verge. Recommend colonoscopy. No surrounding lymphadenopathy. No distant metastatic disease.  4. Distended bladder.  5. Cholelithiasis.  6. Chronic appearing L1 and L2 compression fractures.  7. Calcified aortic atherosclerosis in the chest and abdomen."  Feels much better today compared to how she felt on admission.  Still has leukocytosis (is on Levaquin).  Sodium still very low at 117.  Potassium improved at 3.3.   Past Medical History  Diagnosis Date  . Osteoporosis     Past Surgical History  Procedure Laterality Date  . Tonsillectomy      Prior to Admission medications   Medication Sig Start Date End Date Taking? Authorizing Provider  albuterol (PROVENTIL HFA;VENTOLIN HFA) 108 (90 BASE) MCG/ACT inhaler Inhale 2 puffs into the lungs every 4 (four) hours as needed for wheezing or shortness of breath.   Yes Historical Provider, MD  ibuprofen (ADVIL,MOTRIN) 200 MG tablet Take 400 mg by mouth every 6 (six) hours as needed for headache or  moderate pain.   Yes Historical Provider, MD  predniSONE (DELTASONE) 20 MG tablet Take 60 mg by mouth daily. For 6 days, 05-14-14 is day 5   Yes Historical Provider, MD  Vitamin D, Ergocalciferol, (DRISDOL) 50000 UNITS CAPS capsule Take 50,000 Units by mouth every 7 (seven) days. Takes on wednesdays   Yes Historical Provider,  MD    Current Facility-Administered Medications  Medication Dose Route Frequency Provider Last Rate Last Dose  . 0.9 %  sodium chloride infusion  250 mL Intravenous PRN Jonetta Osgood, MD      . acetaminophen (TYLENOL) tablet 650 mg  650 mg Oral Q6H PRN Shanker Kristeen Mans, MD       Or  . acetaminophen (TYLENOL) suppository 650 mg  650 mg Rectal Q6H PRN Shanker Kristeen Mans, MD      . albuterol (PROVENTIL) (2.5 MG/3ML) 0.083% nebulizer solution 2.5 mg  2.5 mg Nebulization Q2H PRN Shanker Kristeen Mans, MD      . enoxaparin (LOVENOX) injection 30 mg  30 mg Subcutaneous Q24H Jonetta Osgood, MD   30 mg at 05/14/14 2248  . folic acid (FOLVITE) tablet 1 mg  1 mg Oral Daily Jonetta Osgood, MD   1 mg at 05/14/14 2248  . furosemide (LASIX) injection 20 mg  20 mg Intravenous Q8H Jonetta Osgood, MD   20 mg at 05/15/14 0603  . guaiFENesin-dextromethorphan (ROBITUSSIN DM) 100-10 MG/5ML syrup 5 mL  5 mL Oral Q4H PRN Shanker Kristeen Mans, MD      . ipratropium-albuterol (DUONEB) 0.5-2.5 (3) MG/3ML nebulizer solution 3 mL  3 mL Nebulization Q6H Shanker Kristeen Mans, MD   3 mL at 05/15/14 0230  . levofloxacin (LEVAQUIN) IVPB 750 mg  750 mg Intravenous Q24H Robbie Lis, MD   750 mg at 05/15/14 0603  . LORazepam (ATIVAN) tablet 1 mg  1 mg Oral Q6H PRN Jonetta Osgood, MD       Or  . LORazepam (ATIVAN) injection 1 mg  1 mg Intravenous Q6H PRN Shanker Kristeen Mans, MD      . morphine 2 MG/ML injection 1 mg  1 mg Intravenous Q4H PRN Jonetta Osgood, MD      . multivitamin with minerals tablet 1 tablet  1 tablet Oral Daily Jonetta Osgood, MD   1 tablet at 05/14/14 2248  . ondansetron (ZOFRAN) tablet 4 mg  4 mg Oral Q6H PRN Shanker Kristeen Mans, MD       Or  . ondansetron (ZOFRAN) injection 4 mg  4 mg Intravenous Q6H PRN Shanker Kristeen Mans, MD      . oxyCODONE (Oxy IR/ROXICODONE) immediate release tablet 5 mg  5 mg Oral Q4H PRN Shanker Kristeen Mans, MD      . potassium chloride 20 MEQ/15ML (10%) liquid 40 mEq  40 mEq  Oral Once Jeryl Columbia, NP      . potassium chloride SA (K-DUR,KLOR-CON) CR tablet 40 mEq  40 mEq Oral Once [employer], NP      . sodium chloride 0.9 % injection 3 mL  3 mL Intravenous Q12H Jonetta Osgood, MD   3 mL at 05/14/14 2253  . sodium chloride 0.9 % injection 3 mL  3 mL Intravenous PRN Jonetta Osgood, MD      . thiamine (VITAMIN B-1) tablet 100 mg  100 mg Oral Daily Jonetta Osgood, MD   100 mg at 05/14/14 2248   Or  . thiamine (B-1) injection 100 mg  100 mg Intravenous Daily Jonetta Osgood, MD        Allergies as of 05/14/2014  . (No Known Allergies)    History reviewed. No pertinent family history.  History   Social History  . Marital Status: Divorced    Spouse Name: N/A    Number of Children: N/A  . Years of Education: N/A   Occupational History  . Not on file.   Social History Main Topics  . Smoking status: Current Every Day Smoker  . Smokeless tobacco: Not on file  . Alcohol Use: Yes  . Drug Use: No  . Sexual Activity: Not on file   Other Topics Concern  . Not on file   Social History Narrative  . No narrative on file    Review of Systems: Ten point ROS is O/W negative except as mentioned in HPI.  Physical Exam: Vital signs in last 24 hours: Temp:  [97.6 F (36.4 C)-98.3 F (36.8 C)] 97.9 F (36.6 C) (10/20 0400) Pulse Rate:  [82-92] 92 (10/20 0405) Resp:  [14-18] 18 (10/20 0405) BP: (107-137)/(57-76) 107/57 mmHg (10/20 0405) SpO2:  [92 %-100 %] 100 % (10/20 0405) Weight:  [101 lb 10.1 oz (46.1 kg)-104 lb 4.4 oz (47.3 kg)] 104 lb 4.4 oz (47.3 kg) (10/20 0400)   General:  Alert, thin, pleasant and cooperative in NAD Head:  Normocephalic and atraumatic. Eyes:  Sclera clear, no icterus.  Conjunctiva pink. Ears:  Normal auditory acuity. Mouth:  No deformity or lesions.   Lungs:  Clear throughout to auscultation, but decreased BS on right. Heart:  Regular rate and rhythm; no murmurs, clicks, rubs, or gallops. Abdomen:   Soft, non-distended.  BS present.  Non-tender. Rectal:  Deferred  Msk:  Symmetrical without gross deformities. Pulses:  Normal pulses noted. Extremities:  Mild pre-tibial edema noted. Neurologic:  Alert and  oriented x4;  grossly normal neurologically. Skin:  Intact without significant lesions or rashes. Psych:  Alert and cooperative. Normal mood and affect.  Intake/Output from previous day: 10/19 0701 - 10/20 0700 In: 790 [P.O.:240; IV Piggyback:550] Out: 8250 [Urine:1625]  Lab Results:  Recent Labs  05/14/14 1732 05/15/14 0510  WBC 21.7* 20.7*  HGB 11.0* 10.4*  HCT 30.5* 28.7*  PLT 452* 356   BMET  Recent Labs  05/14/14 1732 05/14/14 2008 05/15/14 0510  NA 111* 111* 118*  K 2.5* 2.4* 2.8*  CL 65* 65* 71*  CO2 38* 37* 38*  GLUCOSE 132* 119* 122*  BUN 7 7 6   CREATININE 0.39* 0.34* 0.39*  CALCIUM 7.7* 7.5* 7.6*   LFT  Recent Labs  05/14/14 2009  PROT 5.9*  ALBUMIN 1.7*  AST 117*  ALT 27  ALKPHOS 225*  BILITOT 0.5  BILIDIR 0.2  IBILI 0.3   PT/INR  Recent Labs  05/14/14 2009  LABPROT 14.5  INR 1.12   Studies/Results: Dg Chest 2 View  05/14/2014   CLINICAL DATA:  Initial encounter for pneumonia and productive cough.  EXAM: CHEST  2 VIEW  COMPARISON:  No comparison studies available.  FINDINGS: Two views study shows hyperexpansion suggesting emphysema. There is a small right pleural effusion with associated tiny left pleural effusion. Minimal dependent atelectasis is noted bilaterally and airspace infection cannot be excluded at the right base. The cardiopericardial silhouette is within normal limits for size. Bones are diffusely demineralized.  IMPRESSION: Emphysema with small, right greater than left pleural effusions.  Bibasilar airspace disease is likely atelectatic although superimposed infection at the right base would be difficult  to exclude.   Electronically Signed   By: Misty Stanley M.D.   On: 05/14/2014 18:08   Ct Chest W  Contrast  05/14/2014   CLINICAL DATA:  68 year old female with productive cough, shortness of breath, nausea, diarrhea. Abnormal LFTs. Initial encounter.  EXAM: CT CHEST, ABDOMEN, AND PELVIS WITH CONTRAST  TECHNIQUE: Multidetector CT imaging of the chest, abdomen and pelvis was performed following the standard protocol during bolus administration of intravenous contrast.  CONTRAST:  132mL OMNIPAQUE IOHEXOL 300 MG/ML  SOLN  COMPARISON:  Chest radiographs 1752 hr today.  FINDINGS: CT CHEST FINDINGS  Moderate size layering right pleural effusion. Associated compressive atelectasis at the right lung base, affecting both the lower and middle lobes. No superimposed pulmonary consolidation. Small layering left pleural effusion with mild left lung compressive atelectasis. Major airways are patent. There is underlying centrilobular emphysema most apparent in the upper lobes.  No pericardial effusion. No mediastinal or hilar lymphadenopathy. Negative thoracic inlet. Major mediastinal vascular structures are normal except for atherosclerosis. No axillary lymphadenopathy.  Osteopenia.   No acute osseous abnormality identified.  CT ABDOMEN AND PELVIS FINDINGS  Chronic appearing moderate L1 and mild L2 compression fractures. Mild retrolisthesis at those levels. Other lumbar levels intact. No acute osseous abnormality identified.  Moderate volume of ascites. Superimposed distended bladder. Negative uterus and adnexa.  Rounded 38 mm diameter intermediate density soft tissue in the rectum about 6 cm proximal to the anal verge (series 6, image 104 and sagittal image 66). This appears E centric to the lumen. Upstream proximal rectum is decompressed. No mesenteric lymphadenopathy identified.  Decompressed sigmoid colon with diverticulosis. Decompressed left colon. Redundant splenic flexure and transverse colon containing gas. Negative right colon and appendix. Distal small bowel within normal limits. No dilated small bowel.  Decompressed stomach and duodenum.  Nodular liver contour (anterior right lobe series 6, image 57). Fluid in the gallbladder wall with Hyperenhancing gallbladder wall with dependent cholelithiasis (series 6, image 71), but the gallbladder is not distended. Occasional small calcified granulomas in the liver, no other discrete liver lesion. Portal venous system is patent. Negative spleen.  Pancreas, adrenal glands, and kidneys are within normal limits.  Major arterial structures in the abdomen and pelvis are patent with moderate Aortoiliac calcified atherosclerosis noted.  Porta hepatis lymph nodes up to 11 mm short axis. Small ventral mesenteric varices. Small distal paraesophageal varices. No other lymphadenopathy.  IMPRESSION: 1. Moderate layering right pleural effusion with compression of the right lung, but no pneumonia. Small layering left pleural effusion. 2. Ascites, and nodular liver suggesting cirrhosis. No liver mass. 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 Mild porta hepatis lymphadenopathy likely reactive due to chronic liver disease. 3. Suggestion of a 3-4 cm soft tissue mass of the rectum partially narrowing the lumen about 6 cm upstream of the anal verge. Recommend colonoscopy. No surrounding lymphadenopathy. No distant metastatic disease. 4. Distended bladder. 5. Cholelithiasis. 6. Chronic appearing L1 and L2 compression fractures. 7. Calcified aortic atherosclerosis in the chest and abdomen.   Electronically Signed   By: Lars Pinks M.D.   On: 05/14/2014 20:32   Ct Abdomen Pelvis W Contrast  05/14/2014   CLINICAL DATA:  68 year old female with productive cough, shortness of breath, nausea, diarrhea. Abnormal LFTs. Initial encounter.  EXAM: CT CHEST, ABDOMEN, AND PELVIS WITH CONTRAST  TECHNIQUE: Multidetector CT imaging of the chest, abdomen and pelvis was performed following the standard protocol during bolus administration of intravenous  contrast.  CONTRAST:  121mL OMNIPAQUE IOHEXOL 300 MG/ML  SOLN  COMPARISON:  Chest radiographs 1752 hr today.  FINDINGS: CT CHEST FINDINGS  Moderate size layering right pleural effusion. Associated compressive atelectasis at the right lung base, affecting both the lower and middle lobes. No superimposed pulmonary consolidation. Small layering left pleural effusion with mild left lung compressive atelectasis. Major airways are patent. There is underlying centrilobular emphysema most apparent in the upper lobes.  No pericardial effusion. No mediastinal or hilar lymphadenopathy. Negative thoracic inlet. Major mediastinal vascular structures are normal except for atherosclerosis. No axillary lymphadenopathy.  Osteopenia.   No acute osseous abnormality identified.  CT ABDOMEN AND PELVIS FINDINGS  Chronic appearing moderate L1 and mild L2 compression fractures. Mild retrolisthesis at those levels. Other lumbar levels intact. No acute osseous abnormality identified.  Moderate volume of ascites. Superimposed distended bladder. Negative uterus and adnexa.  Rounded 38 mm diameter intermediate density soft tissue in the rectum about 6 cm proximal to the anal verge (series 6, image 104 and sagittal image 66). This appears E centric to the lumen. Upstream proximal rectum is decompressed. No mesenteric lymphadenopathy identified.  Decompressed sigmoid colon with diverticulosis. Decompressed left colon. Redundant splenic flexure and transverse colon containing gas. Negative right colon and appendix. Distal small bowel within normal limits. No dilated small bowel. Decompressed stomach and duodenum.  Nodular liver contour (anterior right lobe series 6, image 57). Fluid in the gallbladder wall with Hyperenhancing gallbladder wall with dependent cholelithiasis (series 6, image 71), but the gallbladder is not distended. Occasional small calcified granulomas in the liver, no other discrete liver lesion. Portal venous system is patent.  Negative spleen.  Pancreas, adrenal glands, and kidneys are within normal limits.  Major arterial structures in the abdomen and pelvis are patent with moderate Aortoiliac calcified atherosclerosis noted.  Porta hepatis lymph nodes up to 11 mm short axis. Small ventral mesenteric varices. Small distal paraesophageal varices. No other lymphadenopathy.  IMPRESSION: 1. Moderate layering right pleural effusion with compression of the right lung, but no pneumonia. Small layering left pleural effusion. 2. Ascites, and nodular liver suggesting cirrhosis. No liver mass. 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 Mild porta hepatis lymphadenopathy likely reactive due to chronic liver disease. 3. Suggestion of a 3-4 cm soft tissue mass of the rectum partially narrowing the lumen about 6 cm upstream of the anal verge. Recommend colonoscopy. No surrounding lymphadenopathy. No distant metastatic disease. 4. Distended bladder. 5. Cholelithiasis. 6. Chronic appearing L1 and L2 compression fractures. 7. Calcified aortic atherosclerosis in the chest and abdomen.   Electronically Signed   By: Lars Pinks M.D.   On: 05/14/2014 20:32    IMPRESSION:  -Soft tissue mass (3-4 cm) of the rectum partially narrowing the lumen for which colonoscopy is recommended. -Cirrhosis:  LFT elevatation c/w ETOH hepatitis.  Hep C Ab and Hep BsAg negative.  Ascites and evidence of small distal paraesophageal and ventral mesenteric varices noted on CT scan. -Right sided pleural effusion:  ? Hepatic hydrothorax. -Hyponatremia/hypokalemia:  Renal consulted and recommended low dose IV lasix and fluid restriction.  -Leukocytosis:  Afebrile.  ? Of some PNA as well.  On levaquin.  Had been on prednisone as outpatient so this could be some reactive elevation from that as well.  PLAN: -Will need eventual colonoscopy (or at least flexible sigmoidoscopy).  Will discuss with Dr. Henrene Pastor regarding timing. -Will  need eventual EGD for variceal screening as well, which can be performed as an outpatient. -? Need for  thoracentesis to rule out infection in fluid. -ETOH abstinence.   ZEHR, JESSICA D.  05/15/2014, 9:18 AM  Pager number [phone]  GI ATTENDING  History, laboratories, x-rays reviewed. Patient personally seen and examined. Friend in room. Agree with H&P as outlined above. Very complicated case. Patient has multiple medical problems. First, probable pneumonia for which she is on antibiotics. Next, significant electrolyte disturbance with profound hyponatremia. Being corrected. 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 Finally, possible proximal rectal mass by CT. Mentating well. Not acutely ill. Physical exam does not reveal overwhelming ascites. Recommend correction of electrolytes, discontinuation of all alcohol, watch for alcohol withdrawal, treat pneumonia, diagnostic paracentesis (if enough fluid available for analysis) to rule out SBP. Finally, colonoscopy prior to discharge to clarify possible mass in the proximal rectum by CT. Discussed with patient in detail. We will follow  Docia Chuck. Geri Seminole., M.D. [facility] Division of Gastroenterology

## 2014-05-15 NOTE — Progress Notes (Signed)
ANTIBIOTIC CONSULT NOTE - INITIAL  Pharmacy Consult for levofloxacin Indication: pneumonia  No Known Allergies  Patient Measurements: Height: 5\' 3"  (160 cm) Weight: 104 lb 4.4 oz (47.3 kg) IBW/kg (Calculated) : 52.4 Adjusted Body Weight:   Vital Signs: Temp: 97.9 F (36.6 C) (10/20 0400) Temp Source: Oral (10/20 0400) BP: 107/57 mmHg (10/20 0405) Pulse Rate: 92 (10/20 0405) Intake/Output from previous day: 10/19 0701 - 10/20 0700 In: 640 [P.O.:240; IV Piggyback:400] Out: 1375 [Urine:1375] Intake/Output from this shift: Total I/O In: 640 [P.O.:240; IV Piggyback:400] Out: 1375 [Urine:1375]  Labs:  Recent Labs  05/14/14 1732 05/14/14 2008 05/15/14 0510  WBC 21.7*  --  20.7*  HGB 11.0*  --  10.4*  PLT 452*  --  356  CREATININE 0.39* 0.34*  --    Estimated Creatinine Clearance: 51.7 ml/min (by C-G formula based on Cr of 0.34). No results found for this basename: VANCOTROUGH, Corlis Leak, VANCORANDOM, GENTTROUGH, GENTPEAK, GENTRANDOM, TOBRATROUGH, TOBRAPEAK, TOBRARND, AMIKACINPEAK, AMIKACINTROU, AMIKACIN,  in the last 72 hours   Microbiology: Recent Results (from the past 720 hour(s))  MRSA PCR SCREENING     Status: None   Collection Time    05/14/14  8:54 PM      Result Value Ref Range Status   MRSA by PCR NEGATIVE  NEGATIVE Final   Comment:            The GeneXpert MRSA Assay (FDA     approved for NASAL specimens     only), is one component of a     comprehensive MRSA colonization     surveillance program. It is not     intended to diagnose MRSA     infection nor to guide or     monitor treatment for     MRSA infections.    Medical History: Past Medical History  Diagnosis Date  . Osteoporosis     Medications:  Anti-infectives   Start     Dose/Rate Route Frequency Ordered Stop   05/15/14 0600  levofloxacin (LEVAQUIN) IVPB 750 mg     750 mg 100 mL/hr over 90 Minutes Intravenous Every 24 hours 05/15/14 0552     05/14/14 1815  cefTRIAXone (ROCEPHIN) 1 g  in dextrose 5 % 50 mL IVPB     1 g 100 mL/hr over 30 Minutes Intravenous  Once 05/14/14 1811 05/14/14 1930   05/14/14 1815  azithromycin (ZITHROMAX) 500 mg in dextrose 5 % 250 mL IVPB  Status:  Discontinued     500 mg 250 mL/hr over 60 Minutes Intravenous  Once 05/14/14 1811 05/14/14 1931     Assessment: Patient with CAP.  Patient given rocephin in ED.   Goal of Therapy:  Levofloxacin dosed based on patient weight and renal function     Plan: Levofloxacin 750mg  iv q24hr Follow up culture results   Nani Skillern Crowford 05/15/2014,5:53 AM

## 2014-05-15 NOTE — Progress Notes (Addendum)
Patient ID: Cassidy Barrett, female   DOB: 05/08/1946, 68 y.o.   MRN: [id number] [facility] PROGRESS NOTE  Cassidy Barrett WPY:[id number] DOB: [birth year] DOA: 05/14/2014 PCP: Imelda Pillow, NP  Brief narrative: 68 y.o. female with a past medical history of ongoing tobacco abuse, EtOH abuse who presented to [facility] ED 05/14/2014 with what initially started as cold / flu like symptoms 3 weeks prior to this admission, decreased appetite, weakness. She was seen in primary care office and was given azithromycina and prednisone taper and CXR done 05/09/2014 apparently showed ride sided pleural effusion, questionable mass. She continue to experience weakness, cough (which is chronic but worse than usual). No fevers, chills. No chest pain. No complaints of shortness of breath. No abdominal pain, nausea or vomiting. On admission, vitals were stable with T max 98.3 F and oxygen saturation in low 90's with [place] oxygen support. CXR showed emphysema with small right greater than left pleural effusion, bibasilar airspace disease with possible superimposed infection at the right base. Patient received IV Rocephin in ED. Further imaging study included CT chest and abdomen which revealed moderate layering right pleural effusion with compression of the right lung, no pneumonia; ascites and possible liver cirrhosis, no liver mass, suggestion of a 3-4 cm soft tissue mass of the rectum partially narrowing the lumen, recommending colonoscopy. Blood work revealed leukocytosis of 21.7, hemoglobin of 11, sodium 111, potassium 2.5, chloride 65, CO2 38. Patient admitted to SDU for further management and evaluation.  Assessment/Plan:    Principal Problem: Acute respiratory failure with hypoxia / Community acquired pneumonia / COPD exacerbation / right pleural effusion / leukocytosis   Possible pneumonia considering patient had cold and flu like symptoms and continues to have cough and she has a history of emphysema / COPD.  Also, questionable / probable pneumonia seen on admission CXR in addition to leukocytosis. She did not have a fever on admission but please note that the patient has already received azithromycin outpatient. We will initiate antibiotic coverage with Levaquin for this admission.  Follow up blood culture results.  Moderate layering pleural effusion on the right see on CT chest. May need to have thoracentesis if respiratory status does not improve.  Patient already received prednisone outpatient. Her leukocytosis is probably reflective of it although cant completely exclude pneumonia for which reason we will treat empirically for pneumonia.  May continue duoneb every 6 hours scheduled and albuterol every every 2 hours as needed for wheezing and shortness of breath.  Oxygen support via [place] to keep O2 saturation above 90%.  Please note other blood owrk included BNP which was only mildly elevated at 297 Active Problems: Alcohol abuse / possible acute alcohol intoxication  Alcohol level not obtained at the time of the admission so will order this. Counseled on cessation especially in light of new findings of possible liver cirrhosis.  Patient is on multivitamin, folic acid and thiamine.   No reports of withdrawals.  Hyponatremia (hypervolemic hyponatremia)  Unclear etiology, alcohol abuse, prerenal / dehydration  Appreciate  renal consult and recommendations.   TSH is WNL. Urine osmolarity is 269, serum osmolality 231; urine creatinine and sodium are pending. Cortisol level is pending.  Patient was started on lasix 20 mg IV Q 8 hours  Soft tissue rectal mass  Noted on CT scan. Per radiology, may need colonoscopy for further evaluation.  Will ask for GI consult.  Hypokalemia  Likely from history of alcohol abuse, electrolyte depletion.   Obtain magnesium level.  Potassium repleted.   DVT Prophylaxis   Lovenox subQ  Code Status: Full.  Family Communication:  plan of care  discussed with the patient Disposition Plan: remains inpatient    IV Access:   Peripheral IV  Procedures and diagnostic studies:   Dg Chest 2 View 05/14/2014  Emphysema with small, right greater than left pleural effusions.  Bibasilar airspace disease is likely atelectatic although superimposed infection at the right base would be difficult to exclude.    Ct Chest W Contrast 05/14/2014   1. Moderate layering right pleural effusion with compression of the right lung, but no pneumonia. Small layering left pleural effusion. 2. Ascites, and nodular liver suggesting cirrhosis. No liver mass. 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 Mild porta hepatis lymphadenopathy likely reactive due to chronic liver disease. 3. Suggestion of a 3-4 cm soft tissue mass of the rectum partially narrowing the lumen about 6 cm upstream of the anal verge. Recommend colonoscopy. No surrounding lymphadenopathy. No distant metastatic disease. 4. Distended bladder. 5. Cholelithiasis. 6. Chronic appearing L1 and L2 compression fractures. 7. Calcified aortic atherosclerosis in the chest and abdomen.     Ct Abdomen Pelvis W Contrast 05/14/2014  1. Moderate layering right pleural effusion with compression of the right lung, but no pneumonia. Small layering left pleural effusion. 2. Ascites, and nodular liver suggesting cirrhosis. No liver mass. 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 Mild porta hepatis lymphadenopathy likely reactive due to chronic liver disease. 3. Suggestion of a 3-4 cm soft tissue mass of the rectum partially narrowing the lumen about 6 cm upstream of the anal verge. Recommend colonoscopy. No surrounding lymphadenopathy. No distant metastatic disease. 4. Distended bladder. 5. Cholelithiasis. 6. Chronic appearing L1 and L2 compression fractures. 7. Calcified aortic atherosclerosis in the chest and abdomen.     Medical Consultants:   Nephrology (Dr. Roney Jaffe) Gastroenterology   Other Consultants:   None     Cassidy Lenz, MD  [facility] Pager [phone]  If 7PM-7AM, please contact night-coverage [email or web address] Password [email or web address] 05/15/2014, 5:38 AM   LOS: 1 day    HPI/Subjective: No acute overnight events.  Objective: Filed Vitals:   05/15/14 0000 05/15/14 0231 05/15/14 0400 05/15/14 0405  BP:    107/57  Pulse:    92  Temp: 97.7 F (36.5 C)  97.9 F (36.6 C)   TempSrc: Oral  Oral   Resp:    18  Height:      Weight:   47.3 kg (104 lb 4.4 oz)   SpO2:  100%  100%    Intake/Output Summary (Last 24 hours) at 05/15/14 0538 Last data filed at 05/15/14 0247  Gross per 24 hour  Intake    640 ml  Output   1375 ml  Net   -735 ml    Exam:   General:  Pt is alert, not in acute distress  Cardiovascular: Regular rate and rhythm, S1/S2, no murmurs  Respiratory: no wheezing, no crackles, no rhonchi  Abdomen: Soft, non tender, non distended, bowel sounds present  Extremities: +1-2 LE pitting edema, pulses DP and PT palpable bilaterally  Neuro: Grossly nonfocal  Data Reviewed: Basic Metabolic Panel:  Recent Labs Lab 05/14/14 1732 05/14/14 2008  NA 111* 111*  K 2.5* 2.4*  CL 65* 65*  CO2 38* 37*  GLUCOSE 132* 119*  BUN 7 7  CREATININE 0.39* 0.34*  CALCIUM 7.7* 7.5*   Liver Function Tests:  Recent Labs  Lab 05/14/14 2009  AST 117*  ALT 27  ALKPHOS 225*  BILITOT 0.5  PROT 5.9*  ALBUMIN 1.7*   No results found for this basename: LIPASE, AMYLASE,  in the last 168 hours No results found for this basename: AMMONIA,  in the last 168 hours CBC:  Recent Labs Lab 05/14/14 1732 05/15/14 0510  WBC 21.7* 20.7*  NEUTROABS 20.4*  --   HGB 11.0* 10.4*  HCT 30.5* 28.7*  MCV 102.7* 102.1*  PLT 452* 356   Cardiac Enzymes: No results found for this basename: CKTOTAL, CKMB, CKMBINDEX, TROPONINI,  in the last 168 hours BNP: No components found with  this basename: POCBNP,  CBG: No results found for this basename: GLUCAP,  in the last 168 hours  MRSA PCR SCREENING     Status: None   Collection Time    05/14/14  8:54 PM      Result Value Ref Range Status   MRSA by PCR NEGATIVE  NEGATIVE Final     Scheduled Meds: . enoxaparin (LOVENOX) injection  30 mg Subcutaneous Q24H  . folic acid  1 mg Oral Daily  . furosemide  20 mg Intravenous Q8H  . ipratropium-albuterol  3 mL Nebulization Q6H  . multivitamin with minerals  1 tablet Oral Daily  . thiamine  100 mg Oral Daily

## 2014-05-15 NOTE — Consult Note (Signed)
Renal Service Consult Note [place] 05/15/2014 [place] Requesting Physician:  Dr Loren Racer  Reason for Consult:  Hyponatremia HPI: The patient is a 68 y.o. year-old with long hx of Etoh abuse presented with cough x 3 weeks, not responding to oral steroids and abx started 5 days ago.  Also pt developed swelling of abd and legs after starting those medications.   Denies prior hx of any chronic medical condition, does not take medication. No HTN/DM. She was told that if she didn't stop drinking that she would have liver damage, or cirrhosis.  LIves with daughter.   Reportedly the pt was having cough and flulike symptoms for about 3 weeks. They went to PCP last week 6 days ago and were given Rx for pred and oral AB.  Then she started "swelling" with fluid in her legs, flanks and abdomen.  She was "groggy " also per daughter and intermittently confused. She was admitted with serum Na 111 yesterday and started on IV lasix. Had good UOP overnight with dec'd edema and today is more alert and back to baseline MS acc to daughter.  Na is 118 today.  UNa was 41 and UCr 47, Uosm around 250.    CXR R effusion   ROS  etoh abuse is primary issue  Past Medical History  Past Medical History  Diagnosis Date  . Osteoporosis    Past Surgical History  Past Surgical History  Procedure Laterality Date  . Tonsillectomy     Family History History reviewed. No pertinent family history. Social History  reports that she has been smoking.  She does not have any smokeless tobacco history on file. She reports that she drinks alcohol. She reports that she does not use illicit drugs. Allergies No Known Allergies Home medications Prior to Admission medications   Medication Sig Start Date End Date Taking? Authorizing Provider  albuterol (PROVENTIL HFA;VENTOLIN HFA) 108 (90 BASE) MCG/ACT inhaler Inhale 2 puffs into the lungs every 4 (four) hours as needed for wheezing or  shortness of breath.   Yes Historical Provider, MD  ibuprofen (ADVIL,MOTRIN) 200 MG tablet Take 400 mg by mouth every 6 (six) hours as needed for headache or moderate pain.   Yes Historical Provider, MD  predniSONE (DELTASONE) 20 MG tablet Take 60 mg by mouth daily. For 6 days, 05-14-14 is day 5   Yes Historical Provider, MD  Vitamin D, Ergocalciferol, (DRISDOL) 50000 UNITS CAPS capsule Take 50,000 Units by mouth every 7 (seven) days. Takes on wednesdays   Yes Historical Provider, MD   Liver Function Tests  Recent Labs Lab 05/14/14 2009  AST 117*  ALT 27  ALKPHOS 225*  BILITOT 0.5  PROT 5.9*  ALBUMIN 1.7*   No results found for this basename: LIPASE, AMYLASE,  in the last 168 hours CBC  Recent Labs Lab 05/14/14 1732 05/15/14 0510  WBC 21.7* 20.7*  NEUTROABS 20.4*  --   HGB 11.0* 10.4*  HCT 30.5* 28.7*  MCV 102.7* 102.1*  PLT 452* 620   Basic Metabolic Panel  Recent Labs Lab 05/14/14 1732 05/14/14 2008 05/15/14 0510  NA 111* 111* 118*  K 2.5* 2.4* 2.8*  CL 65* 65* 71*  CO2 38* 37* 38*  GLUCOSE 132* 119* 122*  BUN 7 7 6   CREATININE 0.39* 0.34* 0.39*  CALCIUM 7.7* 7.5* 7.6*    Filed Vitals:   05/15/14 0000 05/15/14 0231 05/15/14 0400 05/15/14 0405  BP:    107/57  Pulse:  92  Temp: 97.7 F (36.5 C)  97.9 F (36.6 C)   TempSrc: Oral  Oral   Resp:    18  Height:      Weight:   47.3 kg (104 lb 4.4 oz)   SpO2:  100%  100%   Exam Thin, somewhat frail adult female No rash, cyanosis or gangrene Skin is dark tan color Sclera anicteric, throat clear No jvd Chest clear bilat RRR no MRG Abd soft, no ascites noted, no HSM 1+ bilat pretib and flank edema Neuro is alert, Ox 3, nf  UNa - 41 UCr - 44.7 UA - 1.026, negative  CXR moderate R effusion Chest/abd CT scan > + mod ascites, nodular liver, R effusion, emphysema Na 111 yest > 118 today  Assessment: 1 Hyponatremia , hypervolemic - in setting of underlying cirrhosis and etoh abuse , with Na retention  aggravated by steroid prescription. Agree with your management, continue low dose IV lasix and fluid restriction.   2 Etoh abuse / ascites / cirrhosis / hypoalbuminemia 3 Edema due to combination of cirrhosis, hypoalbuminemia, steroid Rx   Plan- continue IV lasix and fluid restriction  Kelly Splinter MD (pgr) [phone]    [phone] 05/15/2014, 8:36 AM

## 2014-05-15 NOTE — Progress Notes (Signed)
CARE MANAGEMENT NOTE 05/15/2014  Patient:  Cassidy Barrett, Cassidy Barrett   Account Number:  [id number]  Date Initiated:  05/15/2014  Documentation initiated by:  DAVIS,RHONDA  Subjective/Objective Assessment:   nausea and vomiting for several day with electrolyte imbalancesSinus rhythm  Probable left atrial enlargement  Anterior infarct, age indeterminat     Action/Plan:   home when stable   Anticipated DC Date:  05/18/2014   Anticipated DC Plan:  HOME/SELF CARE  In-house referral  NA      DC Planning Services  CM consult      PAC Choice  NA   Choice offered to / List presented to:  NA   DME arranged  NA      DME agency  NA     [place] arranged  NA      [place] agency  NA   Status of service:  In process, will continue to follow Medicare Important Message given?   (If response is "NO", the following Medicare IM given date fields will be blank) Date Medicare IM given:   Medicare IM given by:   Date Additional Medicare IM given:   Additional Medicare IM given by:    Discharge Disposition:    Per UR Regulation:  Reviewed for med. necessity/level of care/duration of stay  If discussed at [place] of Stay Meetings, dates discussed:    Comments:  05/15/2014/Rhonda L. Rosana Hoes, RN, BSN, CCM: Chart review for medical necessity and patient discharge needs. Case Manager will follow for patient condition changes.

## 2014-05-16 ENCOUNTER — Inpatient Hospital Stay (HOSPITAL_COMMUNITY): Payer: Medicare Other

## 2014-05-16 DIAGNOSIS — E43 Unspecified severe protein-calorie malnutrition: Secondary | ICD-10-CM | POA: Diagnosis present

## 2014-05-16 LAB — BODY FLUID CELL COUNT WITH DIFFERENTIAL
Lymphs, Fluid: 18 %
Monocyte-Macrophage-Serous Fluid: 78 % (ref 50–90)
Neutrophil Count, Fluid: 4 % (ref 0–25)
Total Nucleated Cell Count, Fluid: 143 cu mm (ref 0–1000)

## 2014-05-16 LAB — SODIUM, URINE, RANDOM: SODIUM UR: 63 meq/L

## 2014-05-16 LAB — BASIC METABOLIC PANEL
ANION GAP: 10 (ref 5–15)
ANION GAP: 9 (ref 5–15)
Anion gap: 8 (ref 5–15)
BUN: 5 mg/dL — AB (ref 6–23)
BUN: 5 mg/dL — ABNORMAL LOW (ref 6–23)
BUN: 4 mg/dL — ABNORMAL LOW (ref 6–23)
CALCIUM: 7.7 mg/dL — AB (ref 8.4–10.5)
CALCIUM: 7.4 mg/dL — AB (ref 8.4–10.5)
CHLORIDE: 66 meq/L — AB (ref 96–112)
CHLORIDE: 67 meq/L — AB (ref 96–112)
CO2: 35 meq/L — AB (ref 19–32)
CO2: 32 mEq/L (ref 19–32)
CO2: 31 mEq/L (ref 19–32)
CREATININE: 0.46 mg/dL — AB (ref 0.50–1.10)
Calcium: 7.7 mg/dL — ABNORMAL LOW (ref 8.4–10.5)
Chloride: 68 mEq/L — ABNORMAL LOW (ref 96–112)
Creatinine, Ser: 0.41 mg/dL — ABNORMAL LOW (ref 0.50–1.10)
Creatinine, Ser: 0.43 mg/dL — ABNORMAL LOW (ref 0.50–1.10)
GFR calc Af Amer: >90 mL/min (ref >90)
GFR calc Af Amer: >90 mL/min (ref >90)
GFR calc Af Amer: >90 mL/min (ref >90)
GFR calc non Af Amer: >90 mL/min (ref >90)
GFR calc non Af Amer: >90 mL/min (ref >90)
GFR calc non Af Amer: >90 mL/min (ref >90)
GLUCOSE: 94 mg/dL (ref 70–99)
GLUCOSE: 102 mg/dL — AB (ref 70–99)
Glucose, Bld: 108 mg/dL — ABNORMAL HIGH (ref 70–99)
POTASSIUM: 3.2 meq/L — AB (ref 3.7–5.3)
Potassium: 3.2 mEq/L — ABNORMAL LOW (ref 3.7–5.3)
Potassium: 3 mEq/L — ABNORMAL LOW (ref 3.7–5.3)
SODIUM: 109 meq/L — AB (ref 137–147)
SODIUM: 108 meq/L — AB (ref 137–147)
Sodium: 109 mEq/L — CL (ref 137–147)

## 2014-05-16 LAB — OSMOLALITY, URINE: Osmolality, Ur: 310 mOsm/kg — ABNORMAL LOW (ref 390–1090)

## 2014-05-16 LAB — ALBUMIN, FLUID (OTHER): ALBUMIN FL: 0.4 g/dL

## 2014-05-16 MED ORDER — POTASSIUM CHLORIDE 10 MEQ/100ML IV SOLN
10.0000 meq | INTRAVENOUS | Status: AC
  Administered 2014-05-16 – 2014-05-17 (×3): 10 meq via INTRAVENOUS
  Filled 2014-05-16: qty 100

## 2014-05-16 MED ORDER — POTASSIUM CHLORIDE 10 MEQ/100ML IV SOLN
10.0000 meq | INTRAVENOUS | Status: AC
  Administered 2014-05-16: 10 meq via INTRAVENOUS
  Filled 2014-05-16: qty 100

## 2014-05-16 MED ORDER — SODIUM CHLORIDE 0.9 % IJ SOLN: 10.0000 mL | INTRAMUSCULAR | Status: DC | PRN

## 2014-05-16 MED ORDER — ENSURE PUDDING PO PUDG
1.0000 | Freq: Two times a day (BID) | ORAL | Status: DC
  Administered 2014-05-16 – 2014-05-19 (×3): 1 via ORAL
  Filled 2014-05-16 (×10): qty 1

## 2014-05-16 MED ORDER — SODIUM CHLORIDE 3 % IV SOLN
INTRAVENOUS | Status: DC
  Administered 2014-05-16: 35 mL/h via INTRAVENOUS
  Filled 2014-05-16 (×2): qty 500

## 2014-05-16 MED ORDER — SODIUM CHLORIDE 0.9 % IJ SOLN
10.0000 mL | Freq: Two times a day (BID) | INTRAMUSCULAR | Status: DC
  Administered 2014-05-17: 10 mL

## 2014-05-16 MED ORDER — ENOXAPARIN SODIUM 40 MG/0.4ML ~~LOC~~ SOLN
40.0000 mg | SUBCUTANEOUS | Status: DC
  Administered 2014-05-16 – 2014-05-22 (×7): 40 mg via SUBCUTANEOUS
  Filled 2014-05-16 (×9): qty 0.4

## 2014-05-16 MED ORDER — POTASSIUM CHLORIDE 10 MEQ/100ML IV SOLN
INTRAVENOUS | Status: AC
  Administered 2014-05-16: 10 meq via INTRAVENOUS
  Filled 2014-05-16: qty 100

## 2014-05-16 MED ORDER — MAGNESIUM SULFATE 40 MG/ML IJ SOLN
2.0000 g | Freq: Once | INTRAMUSCULAR | Status: AC
  Administered 2014-05-16: 2 g via INTRAVENOUS
  Filled 2014-05-16: qty 50

## 2014-05-16 NOTE — Progress Notes (Signed)
[facility] Progress Note  Subjective:  Feels ok since she got some sleep last night.  No abdominal pain.  Some nausea after receiving meds, but no vomiting.  Objective:  Vital signs in last 24 hours: Temp:  [97.6 F (36.4 C)-98.7 F (37.1 C)] 97.6 F (36.4 C) (10/21 0800) Pulse Rate:  [87-97] 92 (10/21 0455) Resp:  [14-22] 16 (10/21 0455) BP: (113-120)/(60-69) 120/68 mmHg (10/21 0455) SpO2:  [96 %-100 %] 96 % (10/21 0455) Weight:  [104 lb 11.5 oz (47.5 kg)] 104 lb 11.5 oz (47.5 kg) (10/21 0400)   General:  Alert, thin and appears older than stated age, in NAD Heart:  Regular rate and rhythm; no murmurs Pulm:  CTAB, but decreased BS on the right. Abdomen:  Soft, non-distended. Normal bowel sounds.  Non-tender. Extremities:  Without edema. Neurologic:  Alert and  oriented x4;  grossly normal neurologically.  Intake/Output from previous day: 10/20 0701 - 10/21 0700 In: 339.7 [I.V.:39.7; IV Piggyback:300] Out: 2400 [Urine:2400]  Lab Results:  Recent Labs  05/14/14 1732 05/15/14 0510  WBC 21.7* 20.7*  HGB 11.0* 10.4*  HCT 30.5* 28.7*  PLT 452* 356   BMET  Recent Labs  05/15/14 1314 05/15/14 1700 05/16/14 0750  NA 113* 113* 109*  K 3.2* 3.2* 3.2*  CL 67* 68* 66*  CO2 35* 35* 35*  GLUCOSE 117* 109* 94  BUN 5* 5* 5*  CREATININE 0.42* 0.48* 0.46*  CALCIUM 7.9* 7.7* 7.7*   LFT  Recent Labs  05/14/14 2009  PROT 5.9*  ALBUMIN 1.7*  AST 117*  ALT 27  ALKPHOS 225*  BILITOT 0.5  BILIDIR 0.2  IBILI 0.3   PT/INR  Recent Labs  05/14/14 2009  LABPROT 14.5  INR 1.12   Hepatitis Panel  Recent Labs  05/15/14 0510  HEPBSAG NEGATIVE  HCVAB NEGATIVE    Dg Chest 2 View  05/14/2014   CLINICAL DATA:  Initial encounter for pneumonia and productive cough.  EXAM: CHEST  2 VIEW  COMPARISON:  No comparison studies available.  FINDINGS: Two views study shows hyperexpansion suggesting emphysema. There is a small right pleural effusion with  associated tiny left pleural effusion. Minimal dependent atelectasis is noted bilaterally and airspace infection cannot be excluded at the right base. The cardiopericardial silhouette is within normal limits for size. Bones are diffusely demineralized.  IMPRESSION: Emphysema with small, right greater than left pleural effusions.  Bibasilar airspace disease is likely atelectatic although superimposed infection at the right base would be difficult to exclude.   Electronically Signed   By: Misty Stanley M.D.   On: 05/14/2014 18:08   Ct Chest W Contrast  05/14/2014   CLINICAL DATA:  68 year old female with productive cough, shortness of breath, nausea, diarrhea. Abnormal LFTs. Initial encounter.  EXAM: CT CHEST, ABDOMEN, AND PELVIS WITH CONTRAST  TECHNIQUE: Multidetector CT imaging of the chest, abdomen and pelvis was performed following the standard protocol during bolus administration of intravenous contrast.  CONTRAST:  16mL OMNIPAQUE IOHEXOL 300 MG/ML  SOLN  COMPARISON:  Chest radiographs 1752 hr today.  FINDINGS: CT CHEST FINDINGS  Moderate size layering right pleural effusion. Associated compressive atelectasis at the right lung base, affecting both the lower and middle lobes. No superimposed pulmonary consolidation. Small layering left pleural effusion with mild left lung compressive atelectasis. Major airways are patent. There is underlying centrilobular emphysema most apparent in the upper lobes.  No pericardial effusion. No mediastinal or hilar lymphadenopathy. Negative thoracic inlet. Major mediastinal vascular structures are  normal except for atherosclerosis. No axillary lymphadenopathy.  Osteopenia.   No acute osseous abnormality identified.  CT ABDOMEN AND PELVIS FINDINGS  Chronic appearing moderate L1 and mild L2 compression fractures. Mild retrolisthesis at those levels. Other lumbar levels intact. No acute osseous abnormality identified.  Moderate volume of ascites. Superimposed distended bladder.  Negative uterus and adnexa.  Rounded 38 mm diameter intermediate density soft tissue in the rectum about 6 cm proximal to the anal verge (series 6, image 104 and sagittal image 66). This appears E centric to the lumen. Upstream proximal rectum is decompressed. No mesenteric lymphadenopathy identified.  Decompressed sigmoid colon with diverticulosis. Decompressed left colon. Redundant splenic flexure and transverse colon containing gas. Negative right colon and appendix. Distal small bowel within normal limits. No dilated small bowel. Decompressed stomach and duodenum.  Nodular liver contour (anterior right lobe series 6, image 57). Fluid in the gallbladder wall with Hyperenhancing gallbladder wall with dependent cholelithiasis (series 6, image 71), but the gallbladder is not distended. Occasional small calcified granulomas in the liver, no other discrete liver lesion. Portal venous system is patent. Negative spleen.  Pancreas, adrenal glands, and kidneys are within normal limits.  Major arterial structures in the abdomen and pelvis are patent with moderate Aortoiliac calcified atherosclerosis noted.  Porta hepatis lymph nodes up to 11 mm short axis. Small ventral mesenteric varices. Small distal paraesophageal varices. No other lymphadenopathy.  IMPRESSION: 1. Moderate layering right pleural effusion with compression of the right lung, but no pneumonia. Small layering left pleural effusion. 2. Ascites, and nodular liver suggesting cirrhosis. No liver mass. 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 Mild porta hepatis lymphadenopathy likely reactive due to chronic liver disease. 3. Suggestion of a 3-4 cm soft tissue mass of the rectum partially narrowing the lumen about 6 cm upstream of the anal verge. Recommend colonoscopy. No surrounding lymphadenopathy. No distant metastatic disease. 4. Distended bladder. 5. Cholelithiasis. 6. Chronic appearing L1 and L2  compression fractures. 7. Calcified aortic atherosclerosis in the chest and abdomen.   Electronically Signed   By: Lars Pinks M.D.   On: 05/14/2014 20:32   Ct Abdomen Pelvis W Contrast  05/14/2014   CLINICAL DATA:  68 year old female with productive cough, shortness of breath, nausea, diarrhea. Abnormal LFTs. Initial encounter.  EXAM: CT CHEST, ABDOMEN, AND PELVIS WITH CONTRAST  TECHNIQUE: Multidetector CT imaging of the chest, abdomen and pelvis was performed following the standard protocol during bolus administration of intravenous contrast.  CONTRAST:  187mL OMNIPAQUE IOHEXOL 300 MG/ML  SOLN  COMPARISON:  Chest radiographs 1752 hr today.  FINDINGS: CT CHEST FINDINGS  Moderate size layering right pleural effusion. Associated compressive atelectasis at the right lung base, affecting both the lower and middle lobes. No superimposed pulmonary consolidation. Small layering left pleural effusion with mild left lung compressive atelectasis. Major airways are patent. There is underlying centrilobular emphysema most apparent in the upper lobes.  No pericardial effusion. No mediastinal or hilar lymphadenopathy. Negative thoracic inlet. Major mediastinal vascular structures are normal except for atherosclerosis. No axillary lymphadenopathy.  Osteopenia.   No acute osseous abnormality identified.  CT ABDOMEN AND PELVIS FINDINGS  Chronic appearing moderate L1 and mild L2 compression fractures. Mild retrolisthesis at those levels. Other lumbar levels intact. No acute osseous abnormality identified.  Moderate volume of ascites. Superimposed distended bladder. Negative uterus and adnexa.  Rounded 38 mm diameter intermediate density soft tissue in the rectum about 6 cm proximal to the anal verge (series 6, image  104 and sagittal image 66). This appears E centric to the lumen. Upstream proximal rectum is decompressed. No mesenteric lymphadenopathy identified.  Decompressed sigmoid colon with diverticulosis. Decompressed left  colon. Redundant splenic flexure and transverse colon containing gas. Negative right colon and appendix. Distal small bowel within normal limits. No dilated small bowel. Decompressed stomach and duodenum.  Nodular liver contour (anterior right lobe series 6, image 57). Fluid in the gallbladder wall with Hyperenhancing gallbladder wall with dependent cholelithiasis (series 6, image 71), but the gallbladder is not distended. Occasional small calcified granulomas in the liver, no other discrete liver lesion. Portal venous system is patent. Negative spleen.  Pancreas, adrenal glands, and kidneys are within normal limits.  Major arterial structures in the abdomen and pelvis are patent with moderate Aortoiliac calcified atherosclerosis noted.  Porta hepatis lymph nodes up to 11 mm short axis. Small ventral mesenteric varices. Small distal paraesophageal varices. No other lymphadenopathy.  IMPRESSION: 1. Moderate layering right pleural effusion with compression of the right lung, but no pneumonia. Small layering left pleural effusion. 2. Ascites, and nodular liver suggesting cirrhosis. No liver mass. 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 Mild porta hepatis lymphadenopathy likely reactive due to chronic liver disease. 3. Suggestion of a 3-4 cm soft tissue mass of the rectum partially narrowing the lumen about 6 cm upstream of the anal verge. Recommend colonoscopy. No surrounding lymphadenopathy. No distant metastatic disease. 4. Distended bladder. 5. Cholelithiasis. 6. Chronic appearing L1 and L2 compression fractures. 7. Calcified aortic atherosclerosis in the chest and abdomen.   Electronically Signed   By: Lars Pinks M.D.   On: 05/14/2014 20:32   Assessment / Plan: -Soft tissue mass (3-4 cm) of the rectum partially narrowing the lumen for which colonoscopy is recommended.  -Cirrhosis: LFT elevatation c/w ETOH hepatitis. Hep C Ab and Hep BsAg negative. Ascites and  evidence of small distal paraesophageal and ventral mesenteric varices noted on CT scan.  -Right sided pleural effusion: ? Hepatic hydrothorax.  With some possible PNA as well.  -Hyponatremia/hypokalemia: Renal consulted and recommended low dose IV lasix and fluid restriction.  -Leukocytosis: Afebrile. ? Of some PNA as well. On levaquin. Had been on prednisone as outpatient so this could be some reactive elevation from that as well.  Will rule out SBP/infection in ascites fluid.  -Will need eventual colonoscopy (or at least flexible sigmoidoscopy). Will discuss with Dr. Henrene Pastor regarding timing, but will not be performed until electrolytes much improved.  -Correct electrolytes. -Will need eventual EGD for variceal screening as well, which can be performed as an outpatient (or concurrent with colonoscopy).  -Diagnostic paracentesis ordered with appropriate fluid studies to rule out SBP.  -ETOH abstinence.   LOS: 2 days   ZEHR, JESSICA D.  05/16/2014, 10:26 AM  Pager number [phone]  GI ATTENDING  Interval data and history reviewed. Patient seen and examined. No significant clinical change. Still with leukocytosis and worsening hyponatremia. Ultrasound today to see if fluid available for paracentesis to rule out SBP. Otherwise, continue multiple supportive measures. No immediate plans for colonoscopy. We'll follow with you  Docia Chuck. Geri [place]., M.D. [facility] Division of Gastroenterology

## 2014-05-16 NOTE — Progress Notes (Addendum)
Patient ID: Cassidy Barrett, female   DOB: 08/11/1945, 67 y.o.   MRN: [id number] [facility] PROGRESS NOTE  Cassidy Barrett WUJ:[id number] DOB: Oct 27, 1945 DOA: 05/14/2014 PCP: Imelda Pillow, NP  Brief narrative:  68 y.o. female with a past medical history of ongoing tobacco abuse, EtOH abuse who presented to [facility] ED 05/14/2014 with what initially started as cold / flu like symptoms 3 weeks prior to this admission, decreased appetite, weakness. She was seen in primary care office and was given azithromycina and prednisone taper and CXR done 05/09/2014 apparently showed ride sided pleural effusion, questionable mass. She continue to experience weakness, cough (which is chronic but worse than usual). No fevers, chills. No chest pain. No complaints of shortness of breath. No abdominal pain, nausea or vomiting.   On admission, vitals were stable with T max 98.3 F and oxygen saturation in low 90's with [place] oxygen support. CXR showed emphysema with small right greater than left pleural effusion, bibasilar airspace disease with possible superimposed infection at the right base. Patient received IV Rocephin in ED. Further imaging study included CT chest and abdomen which revealed moderate layering right pleural effusion with compression of the right lung, no pneumonia; ascites and possible liver cirrhosis, no liver mass, suggestion of a 3-4 cm soft tissue mass of the rectum partially narrowing the lumen, recommending colonoscopy. Blood work revealed leukocytosis of 21.7, hemoglobin of 11, sodium 111, potassium 2.5, chloride 65, CO2 38. Hospital course is complicated with ongoing worsening hyponatremia and per renal 3% NaCl infusion is planned.   Assessment/Plan:   Principal Problem:  Acute respiratory failure with hypoxia / Community acquired pneumonia / COPD exacerbation / right pleural effusion / leukocytosis  Possible pneumonia considering patient had cold and flu like symptoms and continues to have  cough and has a history of emphysema / COPD. Also, questionable / probable pneumonia seen on admission CXR in addition to leukocytosis. She did not have a fever on admission but please note that the patient has already received azithromycin outpatient. Continue Levaquin day #2 Follow up blood culture results.  Moderate layering pleural effusion on the right see on CT chest. May need to have thoracentesis if respiratory status does not improve. Currently clinically stable and saturations at target range.  Patient already received prednisone outpatient. Her leukocytosis is probably reflective of it. WBC is trending down. May continue duoneb every 6 hours scheduled and albuterol every every 2 hours as needed for wheezing and shortness of breath.  Oxygen support via [place] to keep O2 saturation above 90%.  Please note other blood work included BNP which was only mildly elevated at 297 Active Problems:  Alcohol abuse / possible acute alcohol intoxication  Alcohol level not obtained at the time of the admission but ordered the following day. It was WNL. Patient is on multivitamin, folic acid and thiamine.  No reports of withdrawals.  Abdominal ascites in th esetting of liver cirrhosis   Plan for diagnostic paracentesis today. Hyponatremia (hypervolemic hyponatremia)   In the setting of alcohol abuse and ascites.  Due to ongoing hyponatremia per renal lasix will be stopped and patient will receive 3% NaCl infusion.  Appreciate renal following.  TSH is WNL. Urine osmolarity is 269, serum osmolality 231; urine creatinine is 45 and sodium 41.  Weight remains stable at 104 lbs, continue to monitor  Soft tissue rectal mass  Noted on CT scan. Per radiology, may need colonoscopy for further evaluation.  Appreciate GI input, plan for colonoscopy on Friday  05/18/2014. Will continue to follow up on GI recommendations  Hypokalemia  Likely from history of alcohol abuse, electrolyte depletion.  Mg is 1.5 (low  end of normal, will supplement and repeat Mg level in AM Potassium is 3.3 this morning so we will supplemented today as well.  Acute on chronic blood loss anemia  Drop in Hg since admission: 11 --> 10.4  No signs of active bleeding, repeat CBC in AM Transaminitis  From alcohol use, alcoholic hepatitis. AST >> ALT  Repeat CMET in AM Severe protein calorie malnutrition  With underlying hypoalbuminemia in the setting of liver cirrhosis, alcohol abuse  Currently tolerating diet well, nutrition consultation requested DVT Prophylaxis  Lovenox subQ  Code Status: Full.  Family Communication: plan of care discussed with the patient and her family at the bedside  Disposition Plan: remains inpatient   IV Access:   Peripheral IV  Procedures and diagnostic studies:   Dg Chest 2 View 05/14/2014 Emphysema with small, right greater than left pleural effusions. Bibasilar airspace disease is likely atelectatic although superimposed infection at the right base would be difficult to exclude.  Ct Chest W Contrast 05/14/2014 1. Moderate layering right pleural effusion with compression of the right lung, but no pneumonia. Small layering left pleural effusion. 2. Ascites, and nodular liver suggesting cirrhosis. No liver mass. 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 Mild porta hepatis lymphadenopathy likely reactive due to chronic liver disease. 3. Suggestion of a 3-4 cm soft tissue mass of the rectum partially narrowing the lumen about 6 cm upstream of the anal verge. Recommend colonoscopy. No surrounding lymphadenopathy. No distant metastatic disease. 4. Distended bladder. 5. Cholelithiasis. 6. Chronic appearing L1 and L2 compression fractures. 7. Calcified aortic atherosclerosis in the chest and abdomen.  Ct Abdomen Pelvis W Contrast 05/14/2014 1. Moderate layering right pleural effusion with compression of the right lung, but no pneumonia. Small layering  left pleural effusion. 2. Ascites, and nodular liver suggesting cirrhosis. No liver mass. 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 Mild porta hepatis lymphadenopathy likely reactive due to chronic liver disease. 3. Suggestion of a 3-4 cm soft tissue mass of the rectum partially narrowing the lumen about 6 cm upstream of the anal verge. Recommend colonoscopy. No surrounding lymphadenopathy. No distant metastatic disease. 4. Distended bladder. 5. Cholelithiasis. 6. Chronic appearing L1 and L2 compression fractures. 7. Calcified aortic atherosclerosis in the chest and abdomen.   Medical Consultants:   Nephrology (Dr. Roney Jaffe)  Gastroenterology   Other Consultants:   None   Anti-Infectives:   Levaquin 10/20 -->  Leisa Lenz, MD  [facility] Pager [phone]  If 7PM-7AM, please contact night-coverage [email or web address] Password [email or web address] 05/16/2014, 8:26 AM   LOS: 2 days    HPI/Subjective: No acute overnight events.  Objective: Filed Vitals:   05/15/14 2000 05/16/14 0000 05/16/14 0400 05/16/14 0455  BP: 113/65 114/69  120/68  Pulse: 95 93  92  Temp: 98.4 F (36.9 C) 98.7 F (37.1 C) 98.3 F (36.8 C)   TempSrc: Oral Oral Oral   Resp: 15 14  16   Height:      Weight:   47.5 kg (104 lb 11.5 oz)   SpO2: 100% 96%  96%    Intake/Output Summary (Last 24 hours) at 05/16/14 0826 Last data filed at 05/16/14 0200  Gross per 24 hour  Intake 339.67 ml  Output   2000 ml  Net -1660.33 ml    Exam:  General:  Pt is alert, follows commands appropriately, not in acute distress  Cardiovascular: Regular rate and rhythm, S1/S2 appreciated   Respiratory: bilateral air entry, no wheezing, no crackles, no rhonchi  Abdomen: non tender, slightly distended, bowel sounds present  Extremities: +2 bilateral LE edema, pulses DP and PT palpable bilaterally  Neuro: Grossly nonfocal  Data Reviewed: Basic Metabolic Panel:  Recent  Labs Lab 05/14/14 2008 05/15/14 0510 05/15/14 0515 05/15/14 0843 05/15/14 1314 05/15/14 1700  NA 111* 118*  --  117* 113* 113*  K 2.4* 2.8*  --  3.3* 3.2* 3.2*  CL 65* 71*  --  71* 67* 68*  CO2 37* 38*  --  36* 35* 35*  GLUCOSE 119* 122*  --  73 117* 109*  BUN 7 6  --  5* 5* 5*  CREATININE 0.34* 0.39*  --  0.39* 0.42* 0.48*  CALCIUM 7.5* 7.6*  --  7.7* 7.9* 7.7*  MG  --   --  1.5  --   --   --    Liver Function Tests:  Recent Labs Lab 05/14/14 2009  AST 117*  ALT 27  ALKPHOS 225*  BILITOT 0.5  PROT 5.9*  ALBUMIN 1.7*   CBC:  Recent Labs Lab 05/14/14 1732 05/15/14 0510  WBC 21.7* 20.7*  NEUTROABS 20.4*  --   HGB 11.0* 10.4*  HCT 30.5* 28.7*  MCV 102.7* 102.1*  PLT 452* 356     MRSA PCR SCREENING     Status: None   Collection Time    05/14/14  8:54 PM      Result Value Ref Range Status   MRSA by PCR NEGATIVE  NEGATIVE Final  URINE CULTURE     Status: None   Collection Time    05/14/14  9:15 PM      Result Value Ref Range Status   Specimen Description URINE, CLEAN CATCH   Final   Value: NO GROWTH     Performed at [facility]   Report Status 05/15/2014 FINAL   Final     Scheduled Meds: . enoxaparin (LOVENOX) injection  30 mg Subcutaneous Q24H  . folic acid  1 mg Oral Daily  . furosemide  20 mg Intravenous Q8H  . ipratropium-albuterol  3 mL Nebulization TID  . levofloxacin (LEVAQUIN)   750 mg Intravenous Q24H  . multivitamin w  1 tablet Oral Daily  . potassium chloride  40 mEq Oral Once  . thiamine  100 mg Oral Daily   Or  . thiamine  100 mg Intravenous Daily

## 2014-05-16 NOTE — Progress Notes (Addendum)
INITIAL NUTRITION ASSESSMENT  DOCUMENTATION CODES Per approved criteria  -Severe malnutrition in the context of chronic illness -Underweight  Pt meets criteria for severe MALNUTRITION in the context of chronic illness as evidenced by PO intake < 75% for > one month, moderate fluid accumulation, and severe muscle wasting .   INTERVENTION: -Recommend Ensure Pudding po BID, each supplement provides 170 kcal and 4 grams of protein -Recommend MagicCup supplement BID w/meals, each supplement provides 290 kcal, 9 gram protein -Encouraged compliance with fluid restriction and diet to assist in normalizing labs -RD to continue to monitor  NUTRITION DIAGNOSIS: Inadequate oral intake related to ETOH abuse/decreased appetite/weakness as evidenced by PO intake < 75% for > one month.   Goal: Pt to meet >/= 90% of their estimated nutrition needs    Monitor:  Total protein/energy intake, labs, weights, supplement acceptance  Reason for Assessment: Consult/Underweight BMI  68 y.o. female  Admitting Dx: Hyponatremia  ASSESSMENT: Cassidy Barrett is a 68 y.o. female with a Past Medical History of ongoing tobacco abuse, EtOH use who presents today with the above noted complaint-possible pneumonia, electrolyte abnormalities   -Pt endorsed ongoing poor appetite for past 3 months, with significant decreased in past one month. Diet recall indicates  pt consuming yogurt for breakfast, half sandwich for lunch, and traditional [place] food for dinner (chicken and dumplings/roast beef/pot roast). Has been drinking gingerale and Gatorade to assist with improving appetite/maintaining hydration -MD noted pt with daily ETOH consumption of 5 beers daily w/one liquor drink at night -Denied unintentional wt loss; noted to have always been small framed; however current weight may be affected by RLE and LLE edema -Pt hyponatremic (Na 109)- currently on 1000 ml fluid restriction. MD also noted pt with ascites r/t  liver cirrohosis  -Current PO intake minimal, < 50% as pt does not enjoy HH diet restrictions; however understood rationale -Was willing to incorporate Ensure pudding and MagicCup supplements to assist in PO intake -Pt with evident muscle wasting in upper arm, acromion, and clavicle regions  Height: Ht Readings from Last 1 Encounters:  05/14/14 5\' 3"  (1.6 m)    Weight: Wt Readings from Last 1 Encounters:  05/16/14 104 lb 11.5 oz (47.5 kg)    Ideal Body Weight: 115 lb  % Ideal Body Weight: 90%  Wt Readings from Last 10 Encounters:  05/16/14 104 lb 11.5 oz (47.5 kg)    Usual Body Weight: pt unsure  % Usual Body Weight: pt unsure  BMI:  Body mass index is 18.55 kg/(m^2).  Estimated Nutritional Needs: Kcal: 1450-1650  Protein: 60-75 gram Fluid: 1000 ml per MD  Skin: nonpitting RLE and LLE edema  Diet Order: Cardiac  EDUCATION NEEDS: -No education needs identified at this time   Intake/Output Summary (Last 24 hours) at 05/16/14 1122 Last data filed at 05/16/14 1000  Gross per 24 hour  Intake 429.67 ml  Output   1700 ml  Net -1270.33 ml    Last BM: PTA   Labs:   Recent Labs Lab 05/15/14 0510 05/15/14 0515  05/15/14 1314 05/15/14 1700 05/16/14 0750  NA 118*  --   < > 113* 113* 109*  K 2.8*  --   < > 3.2* 3.2* 3.2*  CL 71*  --   < > 67* 68* 66*  CO2 38*  --   < > 35* 35* 35*  BUN 6  --   < > 5* 5* 5*  CREATININE 0.39*  --   < > 0.42* 0.48*  0.46*  CALCIUM 7.6*  --   < > 7.9* 7.7* 7.7*  MG  --  1.5  --   --   --   --   GLUCOSE 122*  --   < > 117* 109* 94  < > = values in this interval not displayed.  CBG (last 3)  No results found for this basename: GLUCAP,  in the last 72 hours  Scheduled Meds: . enoxaparin (LOVENOX) injection  40 mg Subcutaneous Q24H  . feeding supplement (ENSURE)  1 Container Oral BID BM  . folic acid  1 mg Oral Daily  . furosemide  20 mg Intravenous Q8H  . ipratropium-albuterol  3 mL Nebulization TID  . levofloxacin  (LEVAQUIN) IV  750 mg Intravenous Q24H  . multivitamin with minerals  1 tablet Oral Daily  . potassium chloride  40 mEq Oral Once  . sodium chloride  3 mL Intravenous Q12H  . thiamine  100 mg Oral Daily   Or  . thiamine  100 mg Intravenous Daily    Continuous Infusions:   Past Medical History  Diagnosis Date  . Osteoporosis     Past Surgical History  Procedure Laterality Date  . Tonsillectomy      [place] Clinical Dietitian [phone]

## 2014-05-16 NOTE — Progress Notes (Signed)
CRITICAL VALUE ALERT  Critical value received:  Sodium 109  Date of notification:  05/16/2014  Time of notification:  5:49 pm  Critical value read back:Yes.    Nurse who received alert:  Clydene Fake  MD notified (1st page):  Value same as previous critical value, awaiting PICC line placement to begin hypertonic solution as ordered. PICC line currently being placed.  Time of first page:    MD notified (2nd page):  Time of second page:  Responding MD:    Time MD responded:

## 2014-05-16 NOTE — Progress Notes (Addendum)
CRITICAL VALUE ALERT  Critical value received:  Na+ 109   Date of notification:  05/16/2014  Time of notification:  09:00  Critical value read back:Yes.    Nurse who received alert:  Clydene Fake  MD notified (1st page):  Charlies Silvers, A  Time of first page:  09:02  MD notified (2nd page): Charlies Silvers, A   Time of second page:10:51  Responding MD: Neil Crouch  Time MD responded: 10:55

## 2014-05-16 NOTE — Progress Notes (Signed)
  [facility] Progress Note   Subjective: feeling much better. Na levels dropping though, down to 113 and then 109 this am.  Has been under 1000 cc / day intake.  Repeat Uosm 310, UNa 63 on lasix. Lasix now stopped.   Filed Vitals:   05/16/14 1330 05/16/14 1400 05/16/14 1427 05/16/14 1452  BP: 116/62 113/65 113/65 110/60  Pulse: 97 99    Temp:      TempSrc:      Resp: 14 16    Height:      Weight:      SpO2: 99% 97%     Exam: Thin, somewhat frail adult female Skin is dark tan color No jvd  Chest clear bilat  RRR no MRG  Abd soft, no ascites noted, no HSM  1+ pretib edema bilat, better Neuro is alert, Ox 3, nf    Assessment:  1 Hyponatremia - in setting of underlying cirrhosis and etoh abuse. Improved with lasix initially now worsening, needs 3% Na now. Have stopped IV lasix. Continue fluid restriction. Vol excess is questionable now and may be simply 3rd spaced volume that is not accessible with lasix.   2 Etoh abuse / ascites / cirrhosis / hypoalbuminemia 3 Leukocytosis - w/u in progress, urine and blood cx's negative so far  Plan- starting hypertonic saline 3% at 35 cc/hr, q 4 hour Bmets, rate of serum Na change should be around 0.5 mEq/ L / hr    Kelly Splinter MD  pager [phone]    cell [phone]  05/16/2014, 4:51 PM     Recent Labs Lab 05/15/14 1314 05/15/14 1700 05/16/14 0750  NA 113* 113* 109*  K 3.2* 3.2* 3.2*  CL 67* 68* 66*  CO2 35* 35* 35*  GLUCOSE 117* 109* 94  BUN 5* 5* 5*  CREATININE 0.42* 0.48* 0.46*  CALCIUM 7.9* 7.7* 7.7*    Recent Labs Lab 05/14/14 2009  AST 117*  ALT 27  ALKPHOS 225*  BILITOT 0.5  PROT 5.9*  ALBUMIN 1.7*    Recent Labs Lab 05/14/14 1732 05/15/14 0510  WBC 21.7* 20.7*  NEUTROABS 20.4*  --   HGB 11.0* 10.4*  HCT 30.5* 28.7*  MCV 102.7* 102.1*  PLT 452* 356   . enoxaparin (LOVENOX) injection  40 mg Subcutaneous Q24H  . feeding supplement (ENSURE)  1 Container Oral BID BM  . folic acid  1 mg  Oral Daily  . ipratropium-albuterol  3 mL Nebulization TID  . levofloxacin (LEVAQUIN) IV  750 mg Intravenous Q24H  . multivitamin with minerals  1 tablet Oral Daily  . potassium chloride  10 mEq Intravenous Q1 Hr x 3  . sodium chloride  3 mL Intravenous Q12H  . thiamine  100 mg Oral Daily   Or  . thiamine  100 mg Intravenous Daily   . sodium chloride (hypertonic)     sodium chloride, acetaminophen, acetaminophen, albuterol, guaiFENesin-dextromethorphan, LORazepam, LORazepam, morphine injection, ondansetron (ZOFRAN) IV, ondansetron, oxyCODONE, sodium chloride

## 2014-05-16 NOTE — Progress Notes (Signed)
MD Charlies Silvers requesting PICC line urgent and needs done this evening. Spoke with IV team and they are aware of urgency.

## 2014-05-16 NOTE — Progress Notes (Addendum)
IV team called and confirmed received order for PICC line. Stated they will try to put it in later this evening if not in the morning. -- Text paged MD Charlies Silvers to make aware.

## 2014-05-16 NOTE — Progress Notes (Signed)
PICC IV Team RN at bedside

## 2014-05-16 NOTE — Progress Notes (Signed)
Patient noncompliant with patient safety plan in regards to bathroom privileges.  Patient assisted to bedside commode by family member at bedside.  Patient and family reeducated on safety plan and agreed to call either RN or CNA in future but stated that if assistance is not provided fast enough then patient would get out of bed on her own.  Patient needs reinforcement on safety measures.  Will continue to monitor and reinforce fall prevention plan.

## 2014-05-16 NOTE — Plan of Care (Signed)
Problem: Phase I Progression Outcomes Goal: OOB as tolerated unless otherwise ordered Outcome: Progressing Orders now to get out of bed with assistance. Does well with BSC and will progress activity throughout the day.  Problem: Phase II Progression Outcomes Goal: IV changed to normal saline lock Outcome: Completed/Met Date Met:  05/16/14 Running at [facility]

## 2014-05-16 NOTE — Progress Notes (Signed)
Peripherally Inserted Central Catheter/Midline Placement  The IV Nurse has discussed with the patient and/or persons authorized to consent for the patient, the purpose of this procedure and the potential benefits and risks involved with this procedure.  The benefits include less needle sticks, lab draws from the catheter and patient may be discharged home with the catheter.  Risks include, but not limited to, infection, bleeding, blood clot (thrombus formation), and puncture of an artery; nerve damage and irregular heat beat.  Alternatives to this procedure were also discussed.  PICC/Midline Placement Documentation        Darlyn Read 05/16/2014, 5:43 PM

## 2014-05-17 ENCOUNTER — Inpatient Hospital Stay (HOSPITAL_COMMUNITY): Payer: Medicare Other

## 2014-05-17 DIAGNOSIS — K7031 Alcoholic cirrhosis of liver with ascites: Secondary | ICD-10-CM

## 2014-05-17 DIAGNOSIS — R7989 Other specified abnormal findings of blood chemistry: Secondary | ICD-10-CM

## 2014-05-17 DIAGNOSIS — K7011 Alcoholic hepatitis with ascites: Secondary | ICD-10-CM

## 2014-05-17 LAB — MAGNESIUM: MAGNESIUM: 1.8 mg/dL (ref 1.5–2.5)

## 2014-05-17 LAB — CBC
HCT: 24.4 % — ABNORMAL LOW (ref 36.0–46.0)
HEMOGLOBIN: 9 g/dL — AB (ref 12.0–15.0)
MCH: 36.6 pg — AB (ref 26.0–34.0)
MCHC: 36.9 g/dL — ABNORMAL HIGH (ref 30.0–36.0)
MCV: 99.2 fL (ref 78.0–100.0)
Platelets: 295 10*3/uL (ref 150–400)
RBC: 2.46 MIL/uL — AB (ref 3.87–5.11)
RDW: 12.6 % (ref 11.5–15.5)
WBC: 11.4 10*3/uL — AB (ref 4.0–10.5)

## 2014-05-17 LAB — BASIC METABOLIC PANEL
ANION GAP: 9 (ref 5–15)
Anion gap: 7 (ref 5–15)
Anion gap: 9 (ref 5–15)
BUN: 5 mg/dL — ABNORMAL LOW (ref 6–23)
BUN: 4 mg/dL — ABNORMAL LOW (ref 6–23)
BUN: 4 mg/dL — ABNORMAL LOW (ref 6–23)
CALCIUM: 7.4 mg/dL — AB (ref 8.4–10.5)
CHLORIDE: 78 meq/L — AB (ref 96–112)
CHLORIDE: 82 meq/L — AB (ref 96–112)
CO2: 31 mEq/L (ref 19–32)
CO2: 31 meq/L (ref 19–32)
CO2: 29 meq/L (ref 19–32)
CREATININE: 0.35 mg/dL — AB (ref 0.50–1.10)
Calcium: 7.3 mg/dL — ABNORMAL LOW (ref 8.4–10.5)
Calcium: 7.6 mg/dL — ABNORMAL LOW (ref 8.4–10.5)
Chloride: 70 mEq/L — ABNORMAL LOW (ref 96–112)
Creatinine, Ser: 0.4 mg/dL — ABNORMAL LOW (ref 0.50–1.10)
Creatinine, Ser: 0.38 mg/dL — ABNORMAL LOW (ref 0.50–1.10)
GFR calc Af Amer: >90 mL/min (ref >90)
GFR calc Af Amer: >90 mL/min (ref >90)
GFR calc Af Amer: >90 mL/min (ref >90)
GFR calc non Af Amer: >90 mL/min (ref >90)
GFR calc non Af Amer: >90 mL/min (ref >90)
GLUCOSE: 117 mg/dL — AB (ref 70–99)
Glucose, Bld: 116 mg/dL — ABNORMAL HIGH (ref 70–99)
Glucose, Bld: 97 mg/dL (ref 70–99)
POTASSIUM: 3 meq/L — AB (ref 3.7–5.3)
Potassium: 3.1 mEq/L — ABNORMAL LOW (ref 3.7–5.3)
Potassium: 3.9 mEq/L (ref 3.7–5.3)
Sodium: 110 mEq/L — CL (ref 137–147)
Sodium: 116 mEq/L — CL (ref 137–147)
Sodium: 120 mEq/L — CL (ref 137–147)

## 2014-05-17 LAB — COMPREHENSIVE METABOLIC PANEL
ALT: 21 U/L (ref 0–35)
AST: 84 U/L — AB (ref 0–37)
Albumin: 1.5 g/dL — ABNORMAL LOW (ref 3.5–5.2)
Alkaline Phosphatase: 175 U/L — ABNORMAL HIGH (ref 39–117)
Anion gap: 8 (ref 5–15)
BUN: 4 mg/dL — ABNORMAL LOW (ref 6–23)
CHLORIDE: 77 meq/L — AB (ref 96–112)
CO2: 32 meq/L (ref 19–32)
CREATININE: 0.39 mg/dL — AB (ref 0.50–1.10)
Calcium: 7.3 mg/dL — ABNORMAL LOW (ref 8.4–10.5)
GLUCOSE: 97 mg/dL (ref 70–99)
Potassium: 3.1 mEq/L — ABNORMAL LOW (ref 3.7–5.3)
Sodium: 117 mEq/L — CL (ref 137–147)
Total Bilirubin: 0.8 mg/dL (ref 0.3–1.2)
Total Protein: 5.1 g/dL — ABNORMAL LOW (ref 6.0–8.3)

## 2014-05-17 MED ORDER — POTASSIUM CHLORIDE 10 MEQ/100ML IV SOLN
INTRAVENOUS | Status: AC
  Filled 2014-05-17: qty 100

## 2014-05-17 MED ORDER — IPRATROPIUM-ALBUTEROL 0.5-2.5 (3) MG/3ML IN SOLN
3.0000 mL | RESPIRATORY_TRACT | Status: DC | PRN
  Administered 2014-05-18 – 2014-05-24 (×6): 3 mL via RESPIRATORY_TRACT
  Filled 2014-05-17 (×6): qty 3

## 2014-05-17 MED ORDER — POTASSIUM CHLORIDE 10 MEQ/100ML IV SOLN
10.0000 meq | INTRAVENOUS | Status: AC
  Administered 2014-05-17 (×3): 10 meq via INTRAVENOUS
  Filled 2014-05-17 (×3): qty 100

## 2014-05-17 MED ORDER — SODIUM CHLORIDE 3 % IV SOLN
INTRAVENOUS | Status: DC
  Administered 2014-05-17: 20 mL/h via INTRAVENOUS

## 2014-05-17 NOTE — Plan of Care (Signed)
Problem: Phase I Progression Outcomes Goal: OOB as tolerated unless otherwise ordered Outcome: Completed/Met Date Met:  05/17/14 BSC

## 2014-05-17 NOTE — Progress Notes (Signed)
CRITICAL VALUE ALERT  Critical value received:  Na+ 110  Date of notification:  05/17/2014  Time of notification:  0108  Critical value read back:Yes.    Nurse who received alert:  Charmayne Sheer  MD notified (1st page):  Justin Mend  Time of first page:  0110  MD notified (2nd page):  Time of second page:  Responding MD:  Justin Mend  Time MD responded:  [phone]

## 2014-05-17 NOTE — Progress Notes (Addendum)
Patient ID: Cassidy Barrett, female   DOB: 1946/04/14, 68 y.o.   MRN: [id number] [facility] PROGRESS NOTE  Cassidy Barrett [id number] DOB: 1945/12/03 DOA: 05/14/2014 PCP: Imelda Pillow, NP  Brief narrative: 68 y.o. female with a past medical history of ongoing tobacco abuse, EtOH abuse who presented to [facility] ED 05/14/2014 with what initially started as cold / flu like symptoms 3 weeks prior to this admission, decreased appetite, weakness. She was seen in primary care office and was given azithromycina and prednisone taper and CXR done 05/09/2014 apparently showed ride sided pleural effusion, questionable mass. She continued to experience weakness, cough (which is chronic but worse than usual). No fevers, chills. On admission, vitals were stable. CXR showed emphysema with small right greater than left pleural effusion, bibasilar airspace disease with possible superimposed infection at the right base. Patient received IV Rocephin in ED and we started Levaquin for treatment of possible pneumonia. Further imaging study included CT chest and abdomen which revealed moderate layering right pleural effusion with compression of the right lung, no pneumonia; ascites and possible liver cirrhosis, no liver mass, suggestion of a 3-4 cm soft tissue mass of the rectum partially narrowing the lumen, recommending colonoscopy. Blood work was significant for leukocytosis of 21.7 and sodium of 111, potassium 2.5, chloride 65. Hospital course iss complicated with ongoing hyponatremia and need for 3% NaCl infusion. Renal is following.    Assessment/Plan:   Principal Problem:  Acute respiratory failure with hypoxia / Community acquired pneumonia / COPD exacerbation / right pleural effusion / leukocytosis   Respiratory failure with hypoxia likely due to combination of pneumonia, COPD and right pleural effusion. Possible pneumonia seen on CXR but not on CT chest. Patient however did have cold/flu like symptoms prior  to admission, cough. She has history of COPD, emphysema and her WBC count was 21.7 on admission so treatment for possible pneumonia was started with Levaquin. Outpatient, patient has received Azithromycin.   Continue Levaquin day #3. Blood culture so far show no growth.  Moderate layering pleural effusion on the right see on CT chest. May need to have thoracentesis if respiratory status worsens but as of now respiratory status is stable. Will repeat CXR today to evaluate if pleural effusion is resolving.    Patient already received prednisone outpatient. Her leukocytosis is probably reflective of prednisone and possible pneumonia. WBC count is however trending down.  May continue duoneb TID scheduled and albuterol every every 2 hours as needed for wheezing and / or shortness of breath.   Oxygen support via [place] to keep O2 saturation above 90%.   Please note other blood work included BNP which was only mildly elevated at 297  Active Problems:  Alcohol abuse / possible acute alcohol intoxication   Alcohol level not obtained at the time of the admission but ordered the following day. It was WNL.   Patient is on multivitamin, folic acid and thiamine.   No reports of withdrawals.  Abdominal ascites in th esetting of liver cirrhosis   Patient is status post paracentesis 05/16/2014 with 140 cc fluid drained and sent for analysis. Will follow up on results. . Hyponatremia (hypervolemic hyponatremia)   In the setting of alcohol abuse and ascites. Patient was initially on lasix 20 mg IV every 8 hours but due to ongoing hyponatremia lasix was stopped 05/16/2014 and 3% NaCl infusion started. Patient needed to have PICC line placed for this infusion.   Appreciate renal following.  TSH is WNL. Urine osmolarity is  269, serum osmolality 231; urine creatinine is 45 and sodium 41.  Soft tissue rectal mass   Noted on CT scan. Appreciate GI input, plan for colonoscopy early next week. Initial plan was  for Friday but due to ongoing hyponatremia colonoscopy planned for next week.. Hypokalemia   Likely from history of alcohol abuse, electrolyte depletion.   Potassium being supplemented.  Mg was 1.5 (low end of normal); supplemented and this morning WNL.  Continue to monitor BMP. Acute on chronic blood loss anemia   Drop in Hg since admission: 11 --> 10.4 --> 9.0  No signs of active bleeding. As mentioned above, plan is for colonoscopy on Friday.  Transaminitis   From alcohol use, alcoholic hepatitis. AST >> ALT.  AST trended down from 117 to 84; ALT is WNL. ALP trended down from 225 to 175. Severe protein calorie malnutrition   With underlying hypoalbuminemia in the setting of liver cirrhosis, alcohol abuse   Currently tolerating diet well, nutrition consultation requested DVT Prophylaxis   Lovenox subQ. Platelet count is WNL.   Code Status: Full.  Family Communication: plan of care discussed with the patient and her family at the bedside  Disposition Plan: remains inpatient    IV Access:   PICC line placed 05/16/2014  Procedures and diagnostic studies:   Dg Chest 2 View 05/14/2014 Emphysema with small, right greater than left pleural effusions. Bibasilar airspace disease is likely atelectatic although superimposed infection at the right base would be difficult to exclude.  Ct Chest W Contrast 05/14/2014 1. Moderate layering right pleural effusion with compression of the right lung, but no pneumonia. Small layering left pleural effusion. 2. Ascites, and nodular liver suggesting cirrhosis. No liver mass. 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 Mild porta hepatis lymphadenopathy likely reactive due to chronic liver disease. 3. Suggestion of a 3-4 cm soft tissue mass of the rectum partially narrowing the lumen about 6 cm upstream of the anal verge. Recommend colonoscopy. No surrounding lymphadenopathy. No distant metastatic  disease. 4. Distended bladder. 5. Cholelithiasis. 6. Chronic appearing L1 and L2 compression fractures. 7. Calcified aortic atherosclerosis in the chest and abdomen.  Ct Abdomen Pelvis W Contrast 05/14/2014 1. Moderate layering right pleural effusion with compression of the right lung, but no pneumonia. Small layering left pleural effusion. 2. Ascites, and nodular liver suggesting cirrhosis. No liver mass. 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 Mild porta hepatis lymphadenopathy likely reactive due to chronic liver disease. 3. Suggestion of a 3-4 cm soft tissue mass of the rectum partially narrowing the lumen about 6 cm upstream of the anal verge. Recommend colonoscopy. No surrounding lymphadenopathy. No distant metastatic disease. 4. Distended bladder. 5. Cholelithiasis. 6. Chronic appearing L1 and L2 compression fractures. 7. Calcified aortic atherosclerosis in the chest and abdomen.  US Paracentesis 05/16/2014   Successful ultrasound guided paracentesis yielding 140 mL of ascites.   Medical Consultants:   Nephrology (Dr. Roney Jaffe)  Gastroenterology (Dr. Scarlette Shorts)  Other Consultants:   None   Anti-Infectives:   Levaquin 10/20 -->   Leisa Lenz, MD  [facility] Pager [phone]  If 7PM-7AM, please contact night-coverage [email or web address] Password [email or web address] 05/17/2014, 7:00 AM   LOS: 3 days    HPI/Subjective: No acute overnight events.   Objective: Filed Vitals:   05/17/14 0300 05/17/14 0400 05/17/14 0500 05/17/14 0600  BP:  97/45    Pulse: 86 86 82 82  Temp:  98.4 F (36.9 C)  TempSrc:  Oral    Resp: 14 15 14 12   Height:      Weight:  48 kg (105 lb 13.1 oz)    SpO2: 96% 95% 97% 95%    Intake/Output Summary (Last 24 hours) at 05/17/14 0700 Last data filed at 05/17/14 0600  Gross per 24 hour  Intake 1414.75 ml  Output   1050 ml  Net 364.75 ml    Exam:   General:  Pt is alert, follows commands  appropriately, not in acute distress  Cardiovascular: Regular rate and rhythm, S1/S2, no murmurs  Respiratory: bilateral air entry, no wheezing   Abdomen: Soft, non tender, non distended, bowel sounds present  Extremities: +1 LE pitting edema, pulses DP and PT palpable bilaterally  Neuro: Grossly nonfocal  Data Reviewed: Basic Metabolic Panel:  Recent Labs Lab 05/15/14 0510 05/15/14 0515  05/16/14 0750 05/16/14 1611 05/16/14 2020 05/17/14 05/17/14 0400  NA 118*  --   < > 109* 109* 108* 110* 117*  K 2.8*  --   < > 3.2* 3.2* 3.0* 3.0* 3.1*  CL 71*  --   < > 66* 67* 68* 70* 77*  CO2 38*  --   < > 35* 32 31 31 32  GLUCOSE 122*  --   < > 94 102* 108* 117* 97  BUN 6  --   < > 5* 5* 4* 5* 4*  CREATININE 0.39*  --   < > 0.46* 0.41* 0.43* 0.40* 0.39*  CALCIUM 7.6*  --   < > 7.7* 7.7* 7.4* 7.4* 7.3*  MG  --  1.5  --   --   --   --   --  1.8  < > = values in this interval not displayed. Liver Function Tests:  Recent Labs Lab 05/14/14 2009 05/17/14 0400  AST 117* 84*  ALT 27 21  ALKPHOS 225* 175*  BILITOT 0.5 0.8  PROT 5.9* 5.1*  ALBUMIN 1.7* 1.5*   No results found for this basename: LIPASE, AMYLASE,  in the last 168 hours No results found for this basename: AMMONIA,  in the last 168 hours CBC:  Recent Labs Lab 05/14/14 1732 05/15/14 0510 05/17/14 0400  WBC 21.7* 20.7* 11.4*  NEUTROABS 20.4*  --   --   HGB 11.0* 10.4* 9.0*  HCT 30.5* 28.7* 24.4*  MCV 102.7* 102.1* 99.2  PLT 452* 356 295   Cardiac Enzymes: No results found for this basename: CKTOTAL, CKMB, CKMBINDEX, TROPONINI,  in the last 168 hours BNP: No components found with this basename: POCBNP,  CBG: No results found for this basename: GLUCAP,  in the last 168 hours  Recent Results (from the past 240 hour(s))  CULTURE, BLOOD (ROUTINE X 2)     Status: None   Collection Time    05/14/14  6:35 PM      Result Value Ref Range Status   Specimen Description BLOOD RIGHT ANTECUBITAL   Final   Special  Requests BOTTLES DRAWN AEROBIC AND ANAEROBIC 3ML   Final   Culture  Setup Time     Final   Value: 05/15/2014 01:18     Performed at [facility]   Culture     Final   Value:        BLOOD CULTURE RECEIVED NO GROWTH TO DATE CULTURE WILL BE HELD FOR 5 DAYS BEFORE ISSUING A FINAL NEGATIVE REPORT     Performed at [facility]   Report Status PENDING   Incomplete  CULTURE, BLOOD (  ROUTINE X 2)     Status: None   Collection Time    05/14/14  6:49 PM      Result Value Ref Range Status   Specimen Description BLOOD RIGHT HAND   Final   Special Requests BOTTLES DRAWN AEROBIC ONLY 3ML   Final   Culture  Setup Time     Final   Value: 05/15/2014 01:18     Performed at [facility]   Culture     Final   Value:        BLOOD CULTURE RECEIVED NO GROWTH TO DATE CULTURE WILL BE HELD FOR 5 DAYS BEFORE ISSUING A FINAL NEGATIVE REPORT     Performed at [facility]   Report Status PENDING   Incomplete  MRSA PCR SCREENING     Status: None   Collection Time    05/14/14  8:54 PM      Result Value Ref Range Status   MRSA by PCR NEGATIVE  NEGATIVE Final   Comment:            The GeneXpert MRSA Assay (FDA     approved for NASAL specimens     only), is one component of a     comprehensive MRSA colonization     surveillance program. It is not     intended to diagnose MRSA     infection nor to guide or     monitor treatment for     MRSA infections.  URINE CULTURE     Status: None   Collection Time    05/14/14  9:15 PM      Result Value Ref Range Status   Specimen Description URINE, CLEAN CATCH   Final   Special Requests NONE   Final   Culture  Setup Time     Final   Value: 05/15/2014 01:52     Performed at [place]     Final   Value: NO GROWTH     Performed at [facility]   Culture     Final   Value: NO GROWTH     Performed at [facility]   Report Status 05/15/2014 FINAL   Final     Scheduled Meds: . enoxaparin  (LOVENOX)   40 mg Subcutaneous Q24H  . feeding supplement (ENSURE)  1 Container Oral BID BM  . folic acid  1 mg Oral Daily  . ipratropium-albuterol  3 mL Nebulization TID  . levofloxacin (LEVAQUIN)   750 mg Intravenous Q24H  . multivitamin   1 tablet Oral Daily  . potassium chloride  10 mEq Intravenous Q1 Hr x 4  . thiamine  100 mg Oral Daily

## 2014-05-17 NOTE — Progress Notes (Addendum)
CRITICAL VALUE ALERT  Critical value received:  Sodium 116  Date of notification:  05/17/2014  Time of notification:  09:15  Critical value read back:Yes.    Nurse who received alert:  Clydene Fake  MD notified (1st page):  Charlies Silvers, A  Time of first page:  09:17  MD notified (2nd page): Charlies Silvers, A  Time of second page: 10:40  Responding MD:  Neil Crouch  Time MD responded:  10:44

## 2014-05-17 NOTE — Progress Notes (Signed)
Progress Note   Subjective  feels fine. Having some loose stools   Objective   Vital signs in last 24 hours: Temp:  [97.4 F (36.3 C)-98.5 F (36.9 C)] 98.4 F (36.9 C) (10/22 0400) Pulse Rate:  [82-107] 82 (10/22 0600) Resp:  [12-20] 12 (10/22 0600) BP: (97-116)/(45-79) 97/45 mmHg (10/22 0400) SpO2:  [94 %-100 %] 98 % (10/22 0911) Weight:  [105 lb 13.1 oz (48 kg)] 105 lb 13.1 oz (48 kg) (10/22 0400) Last BM Date: 05/16/14 General:    white female in NAD Heart:  Regular rate and rhythm Lungs: Respirations even and unlabored, Breath sounds diminished bilaterally Abdomen:  Soft, nontender and nondistended. Normal bowel sounds. Extremities:  No significant edema. Neurologic:  Alert and oriented,  grossly normal neurologically. Psych:  Cooperative. Normal mood and affect.  Lab Results:  Recent Labs  05/14/14 1732 05/15/14 0510 05/17/14 0400  WBC 21.7* 20.7* 11.4*  HGB 11.0* 10.4* 9.0*  HCT 30.5* 28.7* 24.4*  PLT 452* 356 295   BMET  Recent Labs  05/17/14 05/17/14 0400 05/17/14 0745  NA 110* 117* 116*  K 3.0* 3.1* 3.1*  CL 70* 77* 78*  CO2 31 32 31  GLUCOSE 117* 97 116*  BUN 5* 4* 4*  CREATININE 0.40* 0.39* 0.38*  CALCIUM 7.4* 7.3* 7.3*   LFT  Recent Labs  05/14/14 2009 05/17/14 0400  PROT 5.9* 5.1*  ALBUMIN 1.7* 1.5*  AST 117* 84*  ALT 27 21  ALKPHOS 225* 175*  BILITOT 0.5 0.8  BILIDIR 0.2  --   IBILI 0.3  --     Studies/Results: US Paracentesis  05/16/2014   CLINICAL DATA:  Abdominal pain, leukocytosis, ascites. Request diagnostic paracentesis.  EXAM: ULTRASOUND GUIDED PARACENTESIS  COMPARISON:  CT scan of the abdomen from 05/14/2014.  PROCEDURE: An ultrasound guided paracentesis was thoroughly discussed with the patient and questions answered. The benefits, risks, alternatives and complications were also discussed. The patient understands and wishes to proceed with the procedure. Written consent was obtained.  Small volume ascites over the  posterior lateral aspect of the liver. Small volume ascites in the left lower quadrant. The volume of ascites has overall significantly reduced in the interim from her CT scan done 10/21.  Ultrasound was performed to localize and mark an adequate pocket of fluid in the left lower quadrant of the abdomen. The area was then prepped and draped in the normal sterile fashion. 1% Lidocaine was used for local anesthesia. Under ultrasound guidance a 19 gauge Yueh catheter was introduced. Paracentesis was performed. The catheter was removed and a dressing applied.  COMPLICATIONS: None immediate  FINDINGS: A total of approximately 140 mL of clear yellow fluid was removed. A fluid sample was sent for laboratory analysis.  IMPRESSION: Successful ultrasound guided paracentesis yielding 140 mL of ascites.  Read by: Ascencion Dike PA-C   Electronically Signed   By: Markus Daft M.D.   On: 05/16/2014 15:20     Assessment / Plan:   7. 68 year old female with rectal mass. Will need sigmoidoscopy when electrolytes improve. No bleeding or bowel changes at home  2. 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 She has a right pleural effusion which could be a hepatic hydrothorax but unusual in absence of ascites. Minimal ascites on ultrasound, 140cc obtained. No SBP. SAAG not calculated (serum albumin not done on same date). No evidence for [facility] on imaging. Coags normal. Platelets normal.  3.  Severe hyponatremia, Na 116. Getting hypertonic IVF.  Hypokalemia, K+ 3.1, repletion in progress. Low Mg+, repletion in progress.  4. Acute respiratory failure / CAP/ COPD exac / right pleural effusion.   5. Leukocytosis, no SBP.   She was apparently on outpatient steroids which be cause. Pulmonary related? On Levaquin. WBC normalizing.  6. Macrocytic anemia, hgb down 2 grams since admission (11 >>>9.0.  No overt bleeding. Anemia probably multifactorial (BM suppression from  ETOH, hemodilution, malnutrition, possibly some previously unrecognized bleeding from rectal mass). Monitor hgb for now.    LOS: 3 days   Tye Savoy  05/17/2014, 9:25 AM   GI ATTENDING  Interval history and data reviewed. Patient seen and examined. Currently stable. No evidence for SBP on paracentesis. Still markedly hyponatremic. She will need colonoscopy prior to discharge. However, do not anticipate this will occur sooner then early next week, do to her hyponatremia (would not be advisable to prep in that circumstance).  Docia Chuck. Geri Seminole., M.D. [facility] Division of Gastroenterology

## 2014-05-17 NOTE — Progress Notes (Signed)
  [facility] Progress Note   Subjective: feeling good.  Serum Na up to 117 with 3% Na then stopped this am due to rapid rise. Repeat was 116.  Pt no complaints  Filed Vitals:   05/17/14 0600 05/17/14 0800 05/17/14 0911 05/17/14 1200  BP:  110/61  104/56  Pulse: 82 81  88  Temp:  97 F (36.1 C)  98.4 F (36.9 C)  TempSrc:  Oral  Oral  Resp: 12 17  15   Height:      Weight:      SpO2: 95% 98% 98% 100%   Exam: Thin, somewhat frail adult female Skin is dark tan color No jvd  Chest clear bilat  RRR no MRG  Abd soft, no ascites noted, no HSM  1+ pretib edema bilat, better Neuro is alert, Ox 3, nf    Assessment:  1 Hyponatremia - due to cirrhosis, vs low solute from etoh (beer "potomania" though not sure if she is a beer drinker or not).  Hypertonic saline is helping. Will resume this afternoon at 1/2 the rate, goal serum Na 130 with rate of correction of approx 0.5 mEq/L/min.  2 Etoh abuse / ascites / cirrhosis / hypoalbuminemia 3 Leukocytosis - better. WBC coming down.   Plan- resume 3% saline at 20 mL/hour    Kelly Splinter MD  pager [phone]    cell [phone]  05/17/2014, 4:10 PM     Recent Labs Lab 05/17/14 05/17/14 0400 05/17/14 0745  NA 110* 117* 116*  K 3.0* 3.1* 3.1*  CL 70* 77* 78*  CO2 31 32 31  GLUCOSE 117* 97 116*  BUN 5* 4* 4*  CREATININE 0.40* 0.39* 0.38*  CALCIUM 7.4* 7.3* 7.3*    Recent Labs Lab 05/14/14 2009 05/17/14 0400  AST 117* 84*  ALT 27 21  ALKPHOS 225* 175*  BILITOT 0.5 0.8  PROT 5.9* 5.1*  ALBUMIN 1.7* 1.5*    Recent Labs Lab 05/14/14 1732 05/15/14 0510 05/17/14 0400  WBC 21.7* 20.7* 11.4*  NEUTROABS 20.4*  --   --   HGB 11.0* 10.4* 9.0*  HCT 30.5* 28.7* 24.4*  MCV 102.7* 102.1* 99.2  PLT 452* 356 295   . enoxaparin (LOVENOX) injection  40 mg Subcutaneous Q24H  . feeding supplement (ENSURE)  1 Container Oral BID BM  . folic acid  1 mg Oral Daily  . levofloxacin (LEVAQUIN) IV  750 mg Intravenous Q24H   . multivitamin with minerals  1 tablet Oral Daily  . sodium chloride  10-40 mL Intracatheter Q12H  . sodium chloride  3 mL Intravenous Q12H  . thiamine  100 mg Oral Daily   Or  . thiamine  100 mg Intravenous Daily   . sodium chloride (hypertonic) Stopped (05/17/14 0657)   sodium chloride, acetaminophen, acetaminophen, guaiFENesin-dextromethorphan, ipratropium-albuterol, LORazepam, LORazepam, morphine injection, ondansetron (ZOFRAN) IV, ondansetron, oxyCODONE, sodium chloride, sodium chloride

## 2014-05-17 NOTE — Plan of Care (Signed)
Problem: Phase II Progression Outcomes Goal: Progress activity as tolerated unless otherwise ordered Outcome: Progressing Moves to [facility] well with assistance

## 2014-05-17 NOTE — Progress Notes (Signed)
CRITICAL VALUE ALERT  Critical value received:  Na+ 108  Date of notification:  05/16/2014  Time of notification:  2130  Critical value read back:Yes.    Nurse who received alert:  A. Tamala Julian  MD notified (1st page):  Justin Mend  Time of first page:  [phone]  MD notified (2nd page):  Time of second page:  Responding MD:  Justin Mend  Time MD responded:  2135

## 2014-05-18 ENCOUNTER — Encounter (HOSPITAL_COMMUNITY): Payer: Self-pay | Admitting: Internal Medicine

## 2014-05-18 DIAGNOSIS — J440 Chronic obstructive pulmonary disease with acute lower respiratory infection: Secondary | ICD-10-CM

## 2014-05-18 DIAGNOSIS — R188 Other ascites: Secondary | ICD-10-CM

## 2014-05-18 DIAGNOSIS — E43 Unspecified severe protein-calorie malnutrition: Secondary | ICD-10-CM

## 2014-05-18 LAB — BASIC METABOLIC PANEL
ANION GAP: 10 (ref 5–15)
BUN: 4 mg/dL — AB (ref 6–23)
CHLORIDE: 81 meq/L — AB (ref 96–112)
CO2: 28 mEq/L (ref 19–32)
Calcium: 7.7 mg/dL — ABNORMAL LOW (ref 8.4–10.5)
Creatinine, Ser: 0.35 mg/dL — ABNORMAL LOW (ref 0.50–1.10)
Glucose, Bld: 107 mg/dL — ABNORMAL HIGH (ref 70–99)
POTASSIUM: 3.6 meq/L — AB (ref 3.7–5.3)
Sodium: 119 mEq/L — CL (ref 137–147)

## 2014-05-18 LAB — CLOSTRIDIUM DIFFICILE BY PCR: Toxigenic C. Difficile by PCR: NEGATIVE

## 2014-05-18 LAB — CBC
HCT: 25.4 % — ABNORMAL LOW (ref 36.0–46.0)
Hemoglobin: 9 g/dL — ABNORMAL LOW (ref 12.0–15.0)
MCH: 36.6 pg — ABNORMAL HIGH (ref 26.0–34.0)
MCHC: 35.4 g/dL (ref 30.0–36.0)
MCV: 103.3 fL — ABNORMAL HIGH (ref 78.0–100.0)
Platelets: 261 10*3/uL (ref 150–400)
RBC: 2.46 MIL/uL — AB (ref 3.87–5.11)
RDW: 12.9 % (ref 11.5–15.5)
WBC: 12.9 10*3/uL — ABNORMAL HIGH (ref 4.0–10.5)

## 2014-05-18 LAB — SODIUM
SODIUM: 119 meq/L — AB (ref 137–147)
Sodium: 125 mEq/L — ABNORMAL LOW (ref 137–147)

## 2014-05-18 LAB — MAGNESIUM: MAGNESIUM: 1.7 mg/dL (ref 1.5–2.5)

## 2014-05-18 MED ORDER — POTASSIUM CHLORIDE CRYS ER 20 MEQ PO TBCR
20.0000 meq | EXTENDED_RELEASE_TABLET | Freq: Two times a day (BID) | ORAL | Status: DC
  Administered 2014-05-18 – 2014-05-25 (×15): 20 meq via ORAL
  Filled 2014-05-18 (×16): qty 1

## 2014-05-18 MED ORDER — SODIUM CHLORIDE 3 % IV SOLN
INTRAVENOUS | Status: DC
  Filled 2014-05-18: qty 500

## 2014-05-18 MED ORDER — SODIUM CHLORIDE 1 G PO TABS
2.0000 g | ORAL_TABLET | Freq: Three times a day (TID) | ORAL | Status: DC
  Administered 2014-05-18 – 2014-05-22 (×13): 2 g via ORAL
  Filled 2014-05-18 (×20): qty 2

## 2014-05-18 NOTE — Progress Notes (Signed)
ANTIBIOTIC CONSULT NOTE - FOLLOW UP  Pharmacy Consult for Levaquin Indication: probable pneumonia  No Known Allergies  Patient Measurements: Height: 5\' 3"  (160 cm) Weight: 105 lb 13.1 oz (48 kg) IBW/kg (Calculated) : 52.4   Vital Signs: Temp: 97.8 F (36.6 C) (10/23 0800) Temp Source: Oral (10/23 0800) BP: 103/57 mmHg (10/23 0800) Pulse Rate: 76 (10/23 0800) Intake/Output from previous day: 10/22 0701 - 10/23 0700 In: 1086.7 [P.O.:240; I.V.:116.7; IV Piggyback:450] Out: 800 [Urine:800] Intake/Output from this shift:    Labs:  Recent Labs  05/17/14 0400 05/17/14 0745 05/17/14 1618 05/18/14 0550  WBC 11.4*  --   --  12.9*  HGB 9.0*  --   --  9.0*  PLT 295  --   --  261  CREATININE 0.39* 0.38* 0.35* 0.35*   Estimated Creatinine Clearance: 52.4 ml/min (by C-G formula based on Cr of 0.35). No results found for this basename: VANCOTROUGH, Corlis Leak, VANCORANDOM, GENTTROUGH, GENTPEAK, GENTRANDOM, TOBRATROUGH, TOBRAPEAK, TOBRARND, AMIKACINPEAK, AMIKACINTROU, AMIKACIN,  in the last 72 hours   Microbiology: Recent Results (from the past 720 hour(s))  CULTURE, BLOOD (ROUTINE X 2)     Status: None   Collection Time    05/14/14  6:35 PM      Result Value Ref Range Status   Specimen Description BLOOD RIGHT ANTECUBITAL   Final   Special Requests BOTTLES DRAWN AEROBIC AND ANAEROBIC 3ML   Final   Culture  Setup Time     Final   Value: 05/15/2014 01:18     Performed at [facility]   Culture     Final   Value:        BLOOD CULTURE RECEIVED NO GROWTH TO DATE CULTURE WILL BE HELD FOR 5 DAYS BEFORE ISSUING A FINAL NEGATIVE REPORT     Performed at [facility]   Report Status PENDING   Incomplete  CULTURE, BLOOD (ROUTINE X 2)     Status: None   Collection Time    05/14/14  6:49 PM      Result Value Ref Range Status   Specimen Description BLOOD RIGHT HAND   Final   Special Requests BOTTLES DRAWN AEROBIC ONLY 3ML   Final   Culture  Setup Time     Final   Value:  05/15/2014 01:18     Performed at [facility]   Culture     Final   Value:        BLOOD CULTURE RECEIVED NO GROWTH TO DATE CULTURE WILL BE HELD FOR 5 DAYS BEFORE ISSUING A FINAL NEGATIVE REPORT     Performed at [facility]   Report Status PENDING   Incomplete  MRSA PCR SCREENING     Status: None   Collection Time    05/14/14  8:54 PM      Result Value Ref Range Status   MRSA by PCR NEGATIVE  NEGATIVE Final   Comment:            The GeneXpert MRSA Assay (FDA     approved for NASAL specimens     only), is one component of a     comprehensive MRSA colonization     surveillance program. It is not     intended to diagnose MRSA     infection nor to guide or     monitor treatment for     MRSA infections.  URINE CULTURE     Status: None   Collection Time    05/14/14  9:15  PM      Result Value Ref Range Status   Specimen Description URINE, CLEAN CATCH   Final   Special Requests NONE   Final   Culture  Setup Time     Final   Value: 05/15/2014 01:52     Performed at [place]     Final   Value: NO GROWTH     Performed at [facility]   Culture     Final   Value: NO GROWTH     Performed at [facility]   Report Status 05/15/2014 FINAL   Final  BODY FLUID CULTURE     Status: None   Collection Time    05/16/14  2:53 PM      Result Value Ref Range Status   Specimen Description PLEURAL   Final   Special Requests Normal   Final   Gram Stain     Final   Value: RARE WBC PRESENT, PREDOMINANTLY MONONUCLEAR     NO ORGANISMS SEEN     Performed at [facility]   Culture     Final   Value: NO GROWTH     Performed at [facility]   Report Status PENDING   Incomplete  CLOSTRIDIUM DIFFICILE BY PCR     Status: None   Collection Time    05/17/14  9:17 PM      Result Value Ref Range Status   C difficile by pcr NEGATIVE  NEGATIVE Final   Comment: Performed at [place]   Start      Dose/Rate Route Frequency Ordered Stop   05/15/14 0600  levofloxacin (LEVAQUIN) IVPB 750 mg     750 mg 100 mL/hr over 90 Minutes Intravenous Every 24 hours 05/15/14 0552     05/14/14 1815  cefTRIAXone (ROCEPHIN) 1 g in dextrose 5 % 50 mL IVPB     1 g 100 mL/hr over 30 Minutes Intravenous  Once 05/14/14 1811 05/14/14 1930   05/14/14 1815  azithromycin (ZITHROMAX) 500 mg in dextrose 5 % 250 mL IVPB  Status:  Discontinued     500 mg 250 mL/hr over 60 Minutes Intravenous  Once 05/14/14 1811 05/14/14 1931      Assessment: 68 y/o F presented to ED 10/19 with worsening cough and weakness despite outpatient azithromycin and prednisone taper, CXR showed emphysema with pleural effusions and possible pneumonia.  Empiric treatment was started with IV azithromycin and ceftriaxone x 1 dose, then IV levofloxacin with pharmacy dosing assistance requested.  Today 10/23: D#4/7 Levaquin 750 gm IV q24h Afebrile WBC improved from admission value Blood cultures negative to date Pleural fluid culture from 10/21 pending SCr low, stable  Goal of Therapy:  Appropriate antibiotic dosing for renal function; eradication of infection.   Plan:  Continue Levaquin 750 mg IV q24h Follow cultures, renal function, clinical course  Clayburn Pert, PharmD, BCPS Pager: [phone] 05/18/2014  9:23 AM

## 2014-05-18 NOTE — Progress Notes (Signed)
CRITICAL VALUE ALERT  Critical value received:  Na+ 119  Date of notification:  05/18/2014  Time of notification:  0640  Critical value read back:Yes.    Nurse who received alert:  Charmayne Sheer  MD notified (1st page):  Mercy Moore  Time of first page:  [phone]  MD notified (2nd page):  Time of second page:  Responding MD:  Mercy Moore  Time MD responded:  [phone]

## 2014-05-18 NOTE — Progress Notes (Signed)
Progress Note   Cassidy Barrett PPJ:[id number] DOB: 1945-12-24 DOA: 05/14/2014 PCP: Imelda Pillow, NP   Brief Narrative:   Cassidy Barrett is an 68 y.o. female with a PMH of ongoing tobacco abuse, EtOH abuse who presented to [facility] ED 05/14/2014 with what initially started as cold / flu like symptoms 3 weeks prior to admission, decreased appetite, weakness. She was seen in primary care office and was given azithromycina and prednisone taper and CXR done 05/09/2014 apparently showed ride sided pleural effusion, questionable mass. She continued to experience weakness, cough (which is chronic but worse than usual). No fevers, chills. On admission, vitals were stable. CXR showed emphysema with small right greater than left pleural effusion, bibasilar airspace disease with possible superimposed infection at the right base. Patient received IV Rocephin in ED and we started Levaquin for treatment of possible pneumonia. Further imaging study included CT chest and abdomen which revealed moderate layering right pleural effusion with compression of the right lung, no pneumonia; ascites and possible liver cirrhosis, no liver mass, suggestion of a 3-4 cm soft tissue mass of the rectum partially narrowing the lumen, recommending colonoscopy. Blood work was significant for leukocytosis of 21.7 and sodium of 111, potassium 2.5, chloride 65. Hospital course is complicated by ongoing hyponatremia and need for 3% NaCl infusion. Renal is following.    Assessment/Plan:   Principal Problem:  Acute respiratory failure with hypoxia / Community acquired pneumonia / COPD exacerbation / right pleural effusion / leukocytosis   Respiratory failure with hypoxia likely due to combination of pneumonia, COPD and right pleural effusion. Possible pneumonia seen on CXR but not on CT chest. Patient however did have cold/flu like symptoms prior to admission, cough. She has history of COPD, emphysema and her WBC count was 21.7 on  admission so treatment for possible pneumonia was started with Levaquin.  Non-toxic appearing with stable respiratory status now. Continue Levaquin for a 7 day course.  Blood cultures so far show no growth.   Moderate layering pleural effusion on the right seen on CT chest, persistent on F/U CXR.   Patient already received prednisone outpatient. Her leukocytosis is probably reflective of prednisone and possible pneumonia. WBC count is however trending down.   Continue duoneb TID scheduled and albuterol every every 2 hours as needed for wheezing and / or shortness of breath.   Oxygen support via [place] to keep O2 saturation above 90%.   Please note other blood work included BNP which was only mildly elevated at 297  Active Problems:  Alcohol abuse / possible acute alcohol intoxication   Alcohol level not obtained at the time of the admission but ordered the following day. It was WNL.   Continue multivitamin, folic acid and thiamine.   No reports of withdrawals.   Abdominal ascites in th esetting of liver cirrhosis  Patient is status post paracentesis 05/16/2014 with 140 cc fluid drained and sent for analysis. Culture pending.   Hyponatremia (hypervolemic hyponatremia)   Secondary to cirrhosis physiology. Patient was initially on lasix 20 mg IV every 8 hours but due to ongoing hyponatremia lasix was stopped 05/16/2014 and 3% NaCl infusion started. Patient needed to have PICC line placed for this infusion. 3% saline and subsequently held secondary to a rapid rise in his serum sodium. Goal rise in sodium 6 mEq/24 hours to a goal of a serum sodium of 130.  Soft tissue rectal mass   Noted on CT scan. Appreciate GI input, plan for colonoscopy early  next week. Initial plan was for Friday but due to ongoing hyponatremia colonoscopy planned for next week..  Hypokalemia/hypomagnesemia   Likely from history of alcohol abuse, electrolyte depletion.   Monitor and replace electrolytes as  needed.  Acute on chronic blood loss anemia   Hemoglobin steadily dropping since admission values.   No signs of active bleeding. As mentioned above, plan is for colonoscopy on Friday.   Transaminitis   From alcohol use, alcoholic hepatitis. AST >> ALT.   Severe protein calorie malnutrition   With underlying hypoalbuminemia in the setting of liver cirrhosis, alcohol abuse   Currently tolerating diet well, nutrition consultation requested  DVT Prophylaxis   Lovenox subQ. Platelet count is WNL.  Code Status: Full.  Family Communication: Plan of care discussed with the patient and her family at the bedside. Disposition Plan: Home when stable.   IV Access:    Peripheral IV   Procedures and diagnostic studies:   Dg Chest 2 View 05/14/2014 Emphysema with small, right greater than left pleural effusions. Bibasilar airspace disease is likely atelectatic although superimposed infection at the right base would be difficult to exclude.   Ct Chest W Contrast 05/14/2014 1. Moderate layering right pleural effusion with compression of the right lung, but no pneumonia. Small layering left pleural effusion. 2. Ascites, and nodular liver suggesting cirrhosis. No liver mass. 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 Mild porta hepatis lymphadenopathy likely reactive due to chronic liver disease. 3. Suggestion of a 3-4 cm soft tissue mass of the rectum partially narrowing the lumen about 6 cm upstream of the anal verge. Recommend colonoscopy. No surrounding lymphadenopathy. No distant metastatic disease. 4. Distended bladder. 5. Cholelithiasis. 6. Chronic appearing L1 and L2 compression fractures. 7. Calcified aortic atherosclerosis in the chest and abdomen.   Ct Abdomen Pelvis W Contrast 05/14/2014 1. Moderate layering right pleural effusion with compression of the right lung, but no pneumonia. Small layering left pleural effusion. 2. Ascites,  and nodular liver suggesting cirrhosis. No liver mass. 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 Mild porta hepatis lymphadenopathy likely reactive due to chronic liver disease. 3. Suggestion of a 3-4 cm soft tissue mass of the rectum partially narrowing the lumen about 6 cm upstream of the anal verge. Recommend colonoscopy. No surrounding lymphadenopathy. No distant metastatic disease. 4. Distended bladder. 5. Cholelithiasis. 6. Chronic appearing L1 and L2 compression fractures. 7. Calcified aortic atherosclerosis in the chest and abdomen.   US Paracentesis 05/16/2014 Successful ultrasound guided paracentesis yielding 140 mL of ascites.   Dg Chest Port 1 View 05/17/2014: Left arm PICC tip in the lower SVC. Persistent bilateral pleural effusions with bibasilar atelectasis.      Medical Consultants:    Nephrology (Dr. Roney Jaffe)   Gastroenterology (Dr. Scarlette Shorts)  Anti-Infectives:    Levaquin 10/20 -->   Subjective:   Cassidy Barrett feels well with no complaints of dyspnea or cough.  She denies pain or withdrawal symptoms.  She is having some loose stools but no melena or hematochezia.  Objective:    Filed Vitals:   05/18/14 0500 05/18/14 0510 05/18/14 0545 05/18/14 0600  BP:  87/46 95/52   Pulse: 88 87 86 82  Temp:      TempSrc:      Resp: 23 17 19 17   Height:      Weight:      SpO2: 95% 96% 95% 95%    Intake/Output Summary (Last 24  hours) at 05/18/14 0711 Last data filed at 05/18/14 0600  Gross per 24 hour  Intake 1086.66 ml  Output    800 ml  Net 286.66 ml    Exam: Gen:  NAD Cardiovascular:  RRR, No M/R/G Respiratory:  Lungs diminished in the bases. Gastrointestinal:  Abdomen soft, NT/ND, + BS Extremities:  Trace to 1+ BLE edema   Data Reviewed:    Labs: Basic Metabolic Panel:  Recent Labs Lab 05/15/14 0510 05/15/14 0515  05/17/14 05/17/14 0400 05/17/14 0745 05/17/14 1618 05/18/14 0550  NA  118*  --   < > 110* 117* 116* 120* 119*  K 2.8*  --   < > 3.0* 3.1* 3.1* 3.9 3.6*  CL 71*  --   < > 70* 77* 78* 82* 81*  CO2 38*  --   < > 31 32 31 29 28   GLUCOSE 122*  --   < > 117* 97 116* 97 107*  BUN 6  --   < > 5* 4* 4* 4* 4*  CREATININE 0.39*  --   < > 0.40* 0.39* 0.38* 0.35* 0.35*  CALCIUM 7.6*  --   < > 7.4* 7.3* 7.3* 7.6* 7.7*  MG  --  1.5  --   --  1.8  --   --  1.7  < > = values in this interval not displayed. GFR Estimated Creatinine Clearance: 52.4 ml/min (by C-G formula based on Cr of 0.35). Liver Function Tests:  Recent Labs Lab 05/14/14 2009 05/17/14 0400  AST 117* 84*  ALT 27 21  ALKPHOS 225* 175*  BILITOT 0.5 0.8  PROT 5.9* 5.1*  ALBUMIN 1.7* 1.5*   Coagulation profile  Recent Labs Lab 05/14/14 2009  INR 1.12    CBC:  Recent Labs Lab 05/14/14 1732 05/15/14 0510 05/17/14 0400 05/18/14 0550  WBC 21.7* 20.7* 11.4* 12.9*  NEUTROABS 20.4*  --   --   --   HGB 11.0* 10.4* 9.0* 9.0*  HCT 30.5* 28.7* 24.4* 25.4*  MCV 102.7* 102.1* 99.2 103.3*  PLT 452* 356 295 261   BNP (last 3 results)  Recent Labs  05/14/14 1732  PROBNP 297.9*   Sepsis Labs:  Recent Labs Lab 05/14/14 1732 05/14/14 1746 05/15/14 0510 05/17/14 0400 05/18/14 0550  WBC 21.7*  --  20.7* 11.4* 12.9*  LATICACIDVEN  --  2.05  --   --   --    Microbiology Recent Results (from the past 240 hour(s))  CULTURE, BLOOD (ROUTINE X 2)     Status: None   Collection Time    05/14/14  6:35 PM      Result Value Ref Range Status   Specimen Description BLOOD RIGHT ANTECUBITAL   Final   Special Requests BOTTLES DRAWN AEROBIC AND ANAEROBIC 3ML   Final   Culture  Setup Time     Final   Value: 05/15/2014 01:18     Performed at [facility]   Culture     Final   Value:        BLOOD CULTURE RECEIVED NO GROWTH TO DATE CULTURE WILL BE HELD FOR 5 DAYS BEFORE ISSUING A FINAL NEGATIVE REPORT     Performed at [facility]   Report Status PENDING   Incomplete  CULTURE, BLOOD  (ROUTINE X 2)     Status: None   Collection Time    05/14/14  6:49 PM      Result Value Ref Range Status   Specimen Description BLOOD RIGHT HAND  Final   Special Requests BOTTLES DRAWN AEROBIC ONLY 3ML   Final   Culture  Setup Time     Final   Value: 05/15/2014 01:18     Performed at [facility]   Culture     Final   Value:        BLOOD CULTURE RECEIVED NO GROWTH TO DATE CULTURE WILL BE HELD FOR 5 DAYS BEFORE ISSUING A FINAL NEGATIVE REPORT     Performed at [facility]   Report Status PENDING   Incomplete  MRSA PCR SCREENING     Status: None   Collection Time    05/14/14  8:54 PM      Result Value Ref Range Status   MRSA by PCR NEGATIVE  NEGATIVE Final   Comment:            The GeneXpert MRSA Assay (FDA     approved for NASAL specimens     only), is one component of a     comprehensive MRSA colonization     surveillance program. It is not     intended to diagnose MRSA     infection nor to guide or     monitor treatment for     MRSA infections.  URINE CULTURE     Status: None   Collection Time    05/14/14  9:15 PM      Result Value Ref Range Status   Specimen Description URINE, CLEAN CATCH   Final   Special Requests NONE   Final   Culture  Setup Time     Final   Value: 05/15/2014 01:52     Performed at [place]     Final   Value: NO GROWTH     Performed at [facility]   Culture     Final   Value: NO GROWTH     Performed at [facility]   Report Status 05/15/2014 FINAL   Final  BODY FLUID CULTURE     Status: None   Collection Time    05/16/14  2:53 PM      Result Value Ref Range Status   Specimen Description PLEURAL   Final   Special Requests Normal   Final   Gram Stain     Final   Value: RARE WBC PRESENT, PREDOMINANTLY MONONUCLEAR     NO ORGANISMS SEEN     Performed at [facility]   Culture     Final   Value: NO GROWTH     Performed at [facility]   Report Status PENDING    Incomplete     Medications:   . enoxaparin (LOVENOX) injection  40 mg Subcutaneous Q24H  . feeding supplement (ENSURE)  1 Container Oral BID BM  . folic acid  1 mg Oral Daily  . levofloxacin (LEVAQUIN) IV  750 mg Intravenous Q24H  . multivitamin with minerals  1 tablet Oral Daily  . thiamine  100 mg Oral Daily   Or  . thiamine  100 mg Intravenous Daily   Continuous Infusions:   Time spent: 35 minutes with > 50% of time discussing current diagnostic test results, clinical impression and plan of care.    LOS: 4 days   [facility] Pager [phone]. If unable to reach me by pager, please call my cell phone at [phone].  *Please refer to [email or web address], password [email or web address] to get updated schedule on who will round on this  patient, as hospitalists switch teams weekly. If 7PM-7AM, please contact night-coverage at [email or web address], password [email or web address] for any overnight needs.  05/18/2014, 7:11 AM

## 2014-05-18 NOTE — Progress Notes (Signed)
PHARMACY BRIEF NOTE -- Pharmacist-Physician Communication  I called Dr. Florene Glen, the nephrologist on call, to report a sodium level of 125 at 16:00 today.  In his note, Dr. Jonnie Finner had indicated a plan to transition to Sodium Chloride tablets when the sodium level was above 125.  Dr. Florene Glen gave orders to discontinue the 3% saline infusion and begin Sodium Chloride tablets 2 grams po 3 times daily.  NichollsPh. 05/18/2014 6:53 PM

## 2014-05-18 NOTE — Progress Notes (Signed)
CRITICAL VALUE ALERT  Critical value received:  Na+ 117  Date of notification:  05/17/2014  Time of notification:  0615  Critical value read back:Yes.    Nurse who received alert:  Charmayne Sheer  MD notified (1st page):  Justin Mend  Time of first page:  0645  MD notified (2nd page):  Time of second page:  Responding MD:  Justin Mend  Time MD responded:  [id number]

## 2014-05-18 NOTE — Progress Notes (Signed)
   [facility] Progress Note   Subjective: feeling good.  Eating better. Na 119 today  Filed Vitals:   05/18/14 0510 05/18/14 0545 05/18/14 0600 05/18/14 0800  BP: 87/46 95/52  103/57  Pulse: 87 86 82 76  Temp:    97.8 F (36.6 C)  TempSrc:    Oral  Resp: 17 19 17 15   Height:      Weight:      SpO2: 96% 95% 95% 98%   Exam: Thin, somewhat frail adult female Skin is dark tan color No jvd  Chest clear bilat  RRR no MRG  Abd soft, no ascites noted, no HSM  1+ pretib edema bilat, better Neuro is alert, Ox 3, nf    Assessment:  1 Hyponatremia - hx cirrhosis, but low solute is likely primary issue. Resuming 3% saline until Na > 125 then will transition to NaCl tablets. Cont fluid restriction. 2 Etoh abuse / ascites / cirrhosis / hypoalbuminemia 3 Leukocytosis - better. WBC coming down.   Plan- resumed 3% saline at 20 mL/hour    Kelly Splinter MD  pager [phone]    cell [phone]  05/18/2014, 8:56 AM     Recent Labs Lab 05/17/14 0745 05/17/14 1618 05/18/14 0550  NA 116* 120* 119*  K 3.1* 3.9 3.6*  CL 78* 82* 81*  CO2 31 29 28   GLUCOSE 116* 97 107*  BUN 4* 4* 4*  CREATININE 0.38* 0.35* 0.35*  CALCIUM 7.3* 7.6* 7.7*    Recent Labs Lab 05/14/14 2009 05/17/14 0400  AST 117* 84*  ALT 27 21  ALKPHOS 225* 175*  BILITOT 0.5 0.8  PROT 5.9* 5.1*  ALBUMIN 1.7* 1.5*    Recent Labs Lab 05/14/14 1732 05/15/14 0510 05/17/14 0400 05/18/14 0550  WBC 21.7* 20.7* 11.4* 12.9*  NEUTROABS 20.4*  --   --   --   HGB 11.0* 10.4* 9.0* 9.0*  HCT 30.5* 28.7* 24.4* 25.4*  MCV 102.7* 102.1* 99.2 103.3*  PLT 452* 356 295 261   . enoxaparin (LOVENOX) injection  40 mg Subcutaneous Q24H  . feeding supplement (ENSURE)  1 Container Oral BID BM  . folic acid  1 mg Oral Daily  . levofloxacin (LEVAQUIN) IV  750 mg Intravenous Q24H  . multivitamin with minerals  1 tablet Oral Daily  . thiamine  100 mg Oral Daily   Or  . thiamine  100 mg Intravenous Daily   .  sodium chloride (hypertonic)     acetaminophen, acetaminophen, guaiFENesin-dextromethorphan, ipratropium-albuterol, morphine injection, ondansetron (ZOFRAN) IV, ondansetron, oxyCODONE

## 2014-05-18 NOTE — Progress Notes (Signed)
CARE MANAGEMENT NOTE 05/18/2014  Patient:  Cassidy Barrett, Cassidy Barrett   Account Number:  [id number]  Date Initiated:  05/15/2014  Documentation initiated by:  Jemma Rasp  Subjective/Objective Assessment:   nausea and vomiting for several day with electrolyte imbalancesSinus rhythm  Probable left atrial enlargement  Anterior infarct, age indeterminat     Action/Plan:   home when stable   Anticipated DC Date:  05/21/2014   Anticipated DC Plan:  HOME/SELF CARE  In-house referral  NA      DC Planning Services  CM consult      PAC Choice  NA   Choice offered to / List presented to:  NA   DME arranged  NA      DME agency  NA     [place] arranged  NA      [place] agency  NA   Status of service:  In process, will continue to follow Medicare Important Message given?   (If response is "NO", the following Medicare IM given date fields will be blank) Date Medicare IM given:   Medicare IM given by:   Date Additional Medicare IM given:   Additional Medicare IM given by:    Discharge Disposition:    Per UR Regulation:  Reviewed for med. necessity/level of care/duration of stay  If discussed at [place] of Stay Meetings, dates discussed:    Comments:  [id number]/Everest Brod l.Brindley Madarang,RN,BSN,CCM: na remains low at 119/iv 3% na infusion continues/ patient to be transferred out of sdu [id number]/  05/15/2014/[place] Athanas L. Rosana Hoes, RN, BSN, CCM: Chart review for medical necessity and patient discharge needs. Case Manager will follow for patient condition changes.

## 2014-05-18 NOTE — Progress Notes (Signed)
    Sodium still too low (119)  to prep for colonoscopy for evaluation of rectal mass. I spoke with patient and told her it would likely be early next week. She feels okay today. Stool for c-diff negative.   Tye Savoy, NP  GI ATTENDING  As above. GI will check back Monday  Docia Chuck. Geri [place]., M.D. [facility] Division of Gastroenterology

## 2014-05-19 LAB — BASIC METABOLIC PANEL
ANION GAP: 8 (ref 5–15)
BUN: 5 mg/dL — ABNORMAL LOW (ref 6–23)
CALCIUM: 7.5 mg/dL — AB (ref 8.4–10.5)
CO2: 27 meq/L (ref 19–32)
CREATININE: 0.4 mg/dL — AB (ref 0.50–1.10)
Chloride: 90 mEq/L — ABNORMAL LOW (ref 96–112)
GFR calc Af Amer: >90 mL/min (ref >90)
Glucose, Bld: 84 mg/dL (ref 70–99)
Potassium: 4.2 mEq/L (ref 3.7–5.3)
Sodium: 125 mEq/L — ABNORMAL LOW (ref 137–147)

## 2014-05-19 LAB — SODIUM
SODIUM: 124 meq/L — AB (ref 137–147)
Sodium: 123 mEq/L — ABNORMAL LOW (ref 137–147)

## 2014-05-19 LAB — CBC
HEMATOCRIT: 23.6 % — AB (ref 36.0–46.0)
Hemoglobin: 8.4 g/dL — ABNORMAL LOW (ref 12.0–15.0)
MCH: 36.4 pg — ABNORMAL HIGH (ref 26.0–34.0)
MCHC: 35.6 g/dL (ref 30.0–36.0)
MCV: 102.2 fL — ABNORMAL HIGH (ref 78.0–100.0)
PLATELETS: 239 10*3/uL (ref 150–400)
RBC: 2.31 MIL/uL — ABNORMAL LOW (ref 3.87–5.11)
RDW: 13 % (ref 11.5–15.5)
WBC: 11.8 10*3/uL — AB (ref 4.0–10.5)

## 2014-05-19 NOTE — Progress Notes (Signed)
Progress Note   Cassidy Barrett TFT:[id number] DOB: May 08, 1946 DOA: 05/14/2014 PCP: Imelda Pillow, NP   Brief Narrative:   Cassidy Barrett is an 68 y.o. female with a PMH of ongoing tobacco abuse, EtOH abuse who presented to [facility] ED 05/14/2014 with what initially started as cold / flu like symptoms 3 weeks prior to admission, decreased appetite, weakness. She was seen in primary care office and was given azithromycina and prednisone taper and CXR done 05/09/2014 apparently showed ride sided pleural effusion, questionable mass. She continued to experience weakness, cough (which is chronic but worse than usual). No fevers, chills. On admission, vitals were stable. CXR showed emphysema with small right greater than left pleural effusion, bibasilar airspace disease with possible superimposed infection at the right base. Patient received IV Rocephin in ED and we started Levaquin for treatment of possible pneumonia. Further imaging study included CT chest and abdomen which revealed moderate layering right pleural effusion with compression of the right lung, no pneumonia; ascites and possible liver cirrhosis, no liver mass, suggestion of a 3-4 cm soft tissue mass of the rectum partially narrowing the lumen, recommending colonoscopy. Blood work was significant for leukocytosis of 21.7 and sodium of 111, potassium 2.5, chloride 65. Hospital course is complicated by ongoing hyponatremia and need for 3% NaCl infusion. Renal is following.    Assessment/Plan:   Principal Problem:  Acute respiratory failure with hypoxia / Community acquired pneumonia / COPD exacerbation / right pleural effusion / leukocytosis   Respiratory failure with hypoxia likely due to combination of pneumonia, COPD and right pleural effusion. Possible pneumonia seen on CXR but not on CT chest. Patient however did have cold/flu like symptoms prior to admission, cough. She has history of COPD, emphysema and her WBC count was 21.7 on  admission so treatment for possible pneumonia was started with Levaquin.  Non-toxic appearing with stable respiratory status now. Continue Levaquin for a 7 day course.  Blood cultures so far show no growth.   Moderate layering pleural effusion on the right seen on CT chest, persistent on F/U CXR.   Patient already received prednisone outpatient. Her leukocytosis is probably reflective of prednisone and possible pneumonia. WBC count is however trending down.   Continue duoneb PRN.   Weaned off oxygen.  Oxygen saturations stable.  Please note other blood work included BNP which was only mildly elevated at 297  Active Problems:  Alcohol abuse / possible acute alcohol intoxication   Alcohol level not obtained at the time of the admission but ordered the following day. It was WNL.   Continue multivitamin, folic acid and thiamine.   No reports of withdrawals.   Abdominal ascites in th esetting of liver cirrhosis  Patient is status post paracentesis 05/16/2014 with 140 cc fluid drained and sent for analysis. Culture pending.   Hyponatremia (hypervolemic hyponatremia)   Secondary to cirrhosis physiology. Currently on salt tablets after sodium treated with 3% saline.  Soft tissue rectal mass   Noted on CT scan. Appreciate GI input, plan for colonoscopy early next week. Initial plan was for Friday but due to ongoing hyponatremia colonoscopy planned for next week..  Hypokalemia/hypomagnesemia   Likely from history of alcohol abuse, electrolyte depletion.   Monitor and replace electrolytes as needed.  Acute on chronic blood loss anemia   Hemoglobin steadily dropping since admission values. Continue to monitor.  No black/bloody stools reported.  No signs of active bleeding. As mentioned above, plan is for colonoscopy on Friday.  Transaminitis   From alcohol use, alcoholic hepatitis. AST >> ALT.   Severe protein calorie malnutrition   With underlying hypoalbuminemia in the  setting of liver cirrhosis, alcohol abuse   Currently tolerating diet well, nutrition consultation requested.  DVT Prophylaxis   Lovenox subQ. Platelet count is WNL.  Code Status: Full.  Family Communication: Plan of care discussed with the patient and her family at the bedside. Disposition Plan: Home when stable.   IV Access:    PICC line placed 05/16/14.   Procedures and diagnostic studies:   Dg Chest 2 View 05/14/2014 Emphysema with small, right greater than left pleural effusions. Bibasilar airspace disease is likely atelectatic although superimposed infection at the right base would be difficult to exclude.   Ct Chest W Contrast 05/14/2014 1. Moderate layering right pleural effusion with compression of the right lung, but no pneumonia. Small layering left pleural effusion. 2. Ascites, and nodular liver suggesting cirrhosis. No liver mass. 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 Mild porta hepatis lymphadenopathy likely reactive due to chronic liver disease. 3. Suggestion of a 3-4 cm soft tissue mass of the rectum partially narrowing the lumen about 6 cm upstream of the anal verge. Recommend colonoscopy. No surrounding lymphadenopathy. No distant metastatic disease. 4. Distended bladder. 5. Cholelithiasis. 6. Chronic appearing L1 and L2 compression fractures. 7. Calcified aortic atherosclerosis in the chest and abdomen.   Ct Abdomen Pelvis W Contrast 05/14/2014 1. Moderate layering right pleural effusion with compression of the right lung, but no pneumonia. Small layering left pleural effusion. 2. Ascites, and nodular liver suggesting cirrhosis. No liver mass. 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 Mild porta hepatis lymphadenopathy likely reactive due to chronic liver disease. 3. Suggestion of a 3-4 cm soft tissue mass of the rectum partially narrowing the lumen about 6 cm upstream  of the anal verge. Recommend colonoscopy. No surrounding lymphadenopathy. No distant metastatic disease. 4. Distended bladder. 5. Cholelithiasis. 6. Chronic appearing L1 and L2 compression fractures. 7. Calcified aortic atherosclerosis in the chest and abdomen.   US Paracentesis 05/16/2014 Successful ultrasound guided paracentesis yielding 140 mL of ascites.   Dg Chest Port 1 View 05/17/2014: Left arm PICC tip in the lower SVC. Persistent bilateral pleural effusions with bibasilar atelectasis.      Medical Consultants:    Nephrology (Dr. Roney Jaffe)   Gastroenterology (Dr. Scarlette Shorts)  Anti-Infectives:    Levaquin 10/20 -->   Subjective:   Pamelia Hoit reports bilateral arm swelling.  No dyspnea, occasional cough.  2-3 loose stools, no melena/hematochezia.  Appetite improving.  Objective:    Filed Vitals:   05/18/14 1136 05/18/14 1200 05/18/14 1440 05/19/14 0431  BP: 118/55  100/52 94/64  Pulse:   89 101  Temp:  98 F (36.7 C) 98.3 F (36.8 C) 97.5 F (36.4 C)  TempSrc:  Oral Oral Oral  Resp: 16  16 16   Height:   5\' 2"  (1.575 m)   Weight:   47.219 kg (104 lb 1.6 oz)   SpO2:   100% 98%    Intake/Output Summary (Last 24 hours) at 05/19/14 0756 Last data filed at 05/18/14 1440  Gross per 24 hour  Intake    300 ml  Output    650 ml  Net   -350 ml    Exam: Gen:  NAD Cardiovascular:  RRR, No M/R/G Respiratory:  Lungs diminished in the bases. Gastrointestinal:  Abdomen soft, NT/ND, + BS  Extremities:  Trace to 1+ BLE edema, UE edema noted   Data Reviewed:    Labs: Basic Metabolic Panel:  Recent Labs Lab 05/15/14 0510 05/15/14 0515  05/17/14 0400 05/17/14 0745 05/17/14 1618 05/18/14 0550 05/18/14 1230 05/18/14 1600 05/19/14 05/19/14 0400  NA 118*  --   < > 117* 116* 120* 119* 119* 125* 124* 125*  K 2.8*  --   < > 3.1* 3.1* 3.9 3.6*  --   --   --  4.2  CL 71*  --   < > 77* 78* 82* 81*  --   --   --  90*  CO2 38*  --   < > 32 31 29 28   --   --    --  27  GLUCOSE 122*  --   < > 97 116* 97 107*  --   --   --  84  BUN 6  --   < > 4* 4* 4* 4*  --   --   --  5*  CREATININE 0.39*  --   < > 0.39* 0.38* 0.35* 0.35*  --   --   --  0.40*  CALCIUM 7.6*  --   < > 7.3* 7.3* 7.6* 7.7*  --   --   --  7.5*  MG  --  1.5  --  1.8  --   --  1.7  --   --   --   --   < > = values in this interval not displayed. GFR Estimated Creatinine Clearance: 51.5 ml/min (by C-G formula based on Cr of 0.4). Liver Function Tests:  Recent Labs Lab 05/14/14 2009 05/17/14 0400  AST 117* 84*  ALT 27 21  ALKPHOS 225* 175*  BILITOT 0.5 0.8  PROT 5.9* 5.1*  ALBUMIN 1.7* 1.5*   Coagulation profile  Recent Labs Lab 05/14/14 2009  INR 1.12    CBC:  Recent Labs Lab 05/14/14 1732 05/15/14 0510 05/17/14 0400 05/18/14 0550 05/19/14 0400  WBC 21.7* 20.7* 11.4* 12.9* 11.8*  NEUTROABS 20.4*  --   --   --   --   HGB 11.0* 10.4* 9.0* 9.0* 8.4*  HCT 30.5* 28.7* 24.4* 25.4* 23.6*  MCV 102.7* 102.1* 99.2 103.3* 102.2*  PLT 452* 356 295 261 239   BNP (last 3 results)  Recent Labs  05/14/14 1732  PROBNP 297.9*   Sepsis Labs:  Recent Labs Lab 05/14/14 1732 05/14/14 1746 05/15/14 0510 05/17/14 0400 05/18/14 0550 05/19/14 0400  WBC 21.7*  --  20.7* 11.4* 12.9* 11.8*  LATICACIDVEN  --  2.05  --   --   --   --    Microbiology Recent Results (from the past 240 hour(s))  CULTURE, BLOOD (ROUTINE X 2)     Status: None   Collection Time    05/14/14  6:35 PM      Result Value Ref Range Status   Specimen Description BLOOD RIGHT ANTECUBITAL   Final   Special Requests BOTTLES DRAWN AEROBIC AND ANAEROBIC 3ML   Final   Culture  Setup Time     Final   Value: 05/15/2014 01:18     Performed at [facility]   Culture     Final   Value:        BLOOD CULTURE RECEIVED NO GROWTH TO DATE CULTURE WILL BE HELD FOR 5 DAYS BEFORE ISSUING A FINAL NEGATIVE REPORT     Performed at [facility]   Report Status PENDING  Incomplete  CULTURE, BLOOD  (ROUTINE X 2)     Status: None   Collection Time    05/14/14  6:49 PM      Result Value Ref Range Status   Specimen Description BLOOD RIGHT HAND   Final   Special Requests BOTTLES DRAWN AEROBIC ONLY 3ML   Final   Culture  Setup Time     Final   Value: 05/15/2014 01:18     Performed at [facility]   Culture     Final   Value:        BLOOD CULTURE RECEIVED NO GROWTH TO DATE CULTURE WILL BE HELD FOR 5 DAYS BEFORE ISSUING A FINAL NEGATIVE REPORT     Performed at [facility]   Report Status PENDING   Incomplete  MRSA PCR SCREENING     Status: None   Collection Time    05/14/14  8:54 PM      Result Value Ref Range Status   MRSA by PCR NEGATIVE  NEGATIVE Final   Comment:            The GeneXpert MRSA Assay (FDA     approved for NASAL specimens     only), is one component of a     comprehensive MRSA colonization     surveillance program. It is not     intended to diagnose MRSA     infection nor to guide or     monitor treatment for     MRSA infections.  URINE CULTURE     Status: None   Collection Time    05/14/14  9:15 PM      Result Value Ref Range Status   Specimen Description URINE, CLEAN CATCH   Final   Special Requests NONE   Final   Culture  Setup Time     Final   Value: 05/15/2014 01:52     Performed at [facility]     Final   Value: NO GROWTH     Performed at [facility]   Culture     Final   Value: NO GROWTH     Performed at [facility]   Report Status 05/15/2014 FINAL   Final  BODY FLUID CULTURE     Status: None   Collection Time    05/16/14  2:53 PM      Result Value Ref Range Status   Specimen Description PLEURAL   Final   Special Requests Normal   Final   Gram Stain     Final   Value: RARE WBC PRESENT, PREDOMINANTLY MONONUCLEAR     NO ORGANISMS SEEN     Performed at [facility]   Culture     Final   Value: NO GROWTH 1 DAY     Performed at [facility]   Report Status PENDING    Incomplete  CLOSTRIDIUM DIFFICILE BY PCR     Status: None   Collection Time    05/17/14  9:17 PM      Result Value Ref Range Status   C difficile by pcr NEGATIVE  NEGATIVE Final   Comment: Performed at [facility]     Medications:   . enoxaparin (LOVENOX) injection  40 mg Subcutaneous Q24H  . feeding supplement (ENSURE)  1 Container Oral BID BM  . folic acid  1 mg Oral Daily  . levofloxacin (LEVAQUIN) IV  750 mg Intravenous Q24H  . multivitamin with  minerals  1 tablet Oral Daily  . potassium chloride  20 mEq Oral BID  . sodium chloride  2 g Oral TID WC  . thiamine  100 mg Oral Daily   Or  . thiamine  100 mg Intravenous Daily   Continuous Infusions:   Time spent: 35 minutes with > 50% of time discussing current diagnostic test results, clinical impression and plan of care.    LOS: 5 days   [facility] Pager [phone]. If unable to reach me by pager, please call my cell phone at [phone].  *Please refer to [email or web address], password [email or web address] to get updated schedule on who will round on this patient, as hospitalists switch teams weekly. If 7PM-7AM, please contact night-coverage at [email or web address], password [email or web address] for any overnight needs.  05/19/2014, 7:56 AM

## 2014-05-19 NOTE — Progress Notes (Signed)
  [facility] Progress Note   Subjective: feeling good.  Eating better. Na 125 this am. Taking salt tabs , has had 3 doses of 2gm each  Filed Vitals:   05/18/14 1200 05/18/14 1440 05/19/14 0431 05/19/14 1425  BP:  100/52 94/64 100/61  Pulse:  89 101 90  Temp: 98 F (36.7 C) 98.3 F (36.8 C) 97.5 F (36.4 C) 97.9 F (36.6 C)  TempSrc: Oral Oral Oral Oral  Resp:  16 16 16   Height:  5\' 2"  (1.575 m)    Weight:  47.219 kg (104 lb 1.6 oz)    SpO2:  100% 98% 99%   Exam: Thin, frail adult female Skin is dark tan color No jvd  Chest clear bilat  RRR no MRG  Abd soft, no ascites noted, no HSM  1+ pretib edema bilat, better Neuro is alert, Ox 3, nf    Assessment:  1 Hyponatremia - due to cirrhosis and/or low solute. S/P hypertonic saline, now stopped. Na better 125 2 Etoh abuse / ascites / cirrhosis / hypoalbuminemia 3 Leukocytosis - better. WBC coming down.   Plan- continue salt tabs (max typically 3 gm tid) and fluid restriction.  If serum Na goes over 135 would reduce/stop salt tabs.  No further suggestions, will sign off.     Kelly Splinter MD  pager [phone]    cell [phone]  05/19/2014, 6:13 PM     Recent Labs Lab 05/17/14 1618 05/18/14 0550  05/19/14 05/19/14 0400 05/19/14 0845  NA 120* 119*  < > 124* 125* 123*  K 3.9 3.6*  --   --  4.2  --   CL 82* 81*  --   --  90*  --   CO2 29 28  --   --  27  --   GLUCOSE 97 107*  --   --  84  --   BUN 4* 4*  --   --  5*  --   CREATININE 0.35* 0.35*  --   --  0.40*  --   CALCIUM 7.6* 7.7*  --   --  7.5*  --   < > = values in this interval not displayed.  Recent Labs Lab 05/14/14 2009 05/17/14 0400  AST 117* 84*  ALT 27 21  ALKPHOS 225* 175*  BILITOT 0.5 0.8  PROT 5.9* 5.1*  ALBUMIN 1.7* 1.5*    Recent Labs Lab 05/14/14 1732  05/17/14 0400 05/18/14 0550 05/19/14 0400  WBC 21.7*  < > 11.4* 12.9* 11.8*  NEUTROABS 20.4*  --   --   --   --   HGB 11.0*  < > 9.0* 9.0* 8.4*  HCT 30.5*  < >  24.4* 25.4* 23.6*  MCV 102.7*  < > 99.2 103.3* 102.2*  PLT 452*  < > 295 261 239  < > = values in this interval not displayed. . enoxaparin (LOVENOX) injection  40 mg Subcutaneous Q24H  . feeding supplement (ENSURE)  1 Container Oral BID BM  . folic acid  1 mg Oral Daily  . levofloxacin (LEVAQUIN) IV  750 mg Intravenous Q24H  . multivitamin with minerals  1 tablet Oral Daily  . potassium chloride  20 mEq Oral BID  . sodium chloride  2 g Oral TID WC  . thiamine  100 mg Oral Daily   Or  . thiamine  100 mg Intravenous Daily     acetaminophen, acetaminophen, guaiFENesin-dextromethorphan, ipratropium-albuterol, ondansetron (ZOFRAN) IV, ondansetron, oxyCODONE

## 2014-05-20 LAB — BODY FLUID CULTURE
CULTURE: NO GROWTH
Special Requests: NORMAL

## 2014-05-20 LAB — BASIC METABOLIC PANEL
Anion gap: 5 (ref 5–15)
BUN: 5 mg/dL — ABNORMAL LOW (ref 6–23)
CALCIUM: 7.3 mg/dL — AB (ref 8.4–10.5)
CO2: 26 mEq/L (ref 19–32)
Chloride: 94 mEq/L — ABNORMAL LOW (ref 96–112)
Creatinine, Ser: 0.37 mg/dL — ABNORMAL LOW (ref 0.50–1.10)
GLUCOSE: 86 mg/dL (ref 70–99)
POTASSIUM: 4.7 meq/L (ref 3.7–5.3)
Sodium: 125 mEq/L — ABNORMAL LOW (ref 137–147)

## 2014-05-20 LAB — CBC
HCT: 23.4 % — ABNORMAL LOW (ref 36.0–46.0)
Hemoglobin: 8.3 g/dL — ABNORMAL LOW (ref 12.0–15.0)
MCH: 36.4 pg — ABNORMAL HIGH (ref 26.0–34.0)
MCHC: 35.5 g/dL (ref 30.0–36.0)
MCV: 102.6 fL — AB (ref 78.0–100.0)
PLATELETS: 242 10*3/uL (ref 150–400)
RBC: 2.28 MIL/uL — ABNORMAL LOW (ref 3.87–5.11)
RDW: 13.1 % (ref 11.5–15.5)
WBC: 12.5 10*3/uL — AB (ref 4.0–10.5)

## 2014-05-20 MED ORDER — BOOST / RESOURCE BREEZE PO LIQD
1.0000 | Freq: Two times a day (BID) | ORAL | Status: DC
  Administered 2014-05-20 – 2014-05-23 (×5): 1 via ORAL

## 2014-05-20 NOTE — Progress Notes (Addendum)
Progress Note   Cassidy Barrett NOM:[id number] DOB: [birth year] DOA: 05/14/2014 PCP: Imelda Pillow, NP   Brief Narrative:   Cassidy Barrett is an 68 y.o. female with a PMH of ongoing tobacco abuse, EtOH abuse who presented to [facility] ED 05/14/2014 with what initially started as cold / flu like symptoms 3 weeks prior to admission, decreased appetite, weakness. She was seen in primary care office and was given azithromycina and prednisone taper and CXR done 05/09/2014 apparently showed ride sided pleural effusion, questionable mass. She continued to experience weakness, cough (which is chronic but worse than usual). No fevers, chills. On admission, vitals were stable. CXR showed emphysema with small right greater than left pleural effusion, bibasilar airspace disease with possible superimposed infection at the right base. Patient received IV Rocephin in ED and we started Levaquin for treatment of possible pneumonia. Further imaging study included CT chest and abdomen which revealed moderate layering right pleural effusion with compression of the right lung, no pneumonia; ascites and possible liver cirrhosis, no liver mass, suggestion of a 3-4 cm soft tissue mass of the rectum partially narrowing the lumen, recommending colonoscopy. Blood work was significant for leukocytosis of 21.7 and sodium of 111, potassium 2.5, chloride 65. Hospital course is complicated by ongoing hyponatremia and need for 3% NaCl infusion. Renal is following.    Assessment/Plan:   Principal Problem:  Acute respiratory failure with hypoxia / Community acquired pneumonia / COPD exacerbation / right pleural effusion / leukocytosis   Non-toxic appearing with stable respiratory status now. Continue Levaquin for a 7 day course. S/P course of prednisone as an outpatient.  Blood cultures so far show no growth.   Moderate layering pleural effusion on the right seen on CT chest, persistent on F/U CXR. Consider  thoracentesis.  Continue duoneb PRN.   Weaned off oxygen.  Oxygen saturations stable.  Active Problems:  Alcohol abuse / possible acute alcohol intoxication   Alcohol level not obtained at the time of the admission but ordered the following day. It was WNL.   Continue multivitamin, folic acid and thiamine.   No reports of withdrawal.   Abdominal ascites in th esetting of liver cirrhosis  Patient is status post paracentesis 05/16/2014 with 140 cc fluid drained and sent for analysis. Culture negative to date.   Hyponatremia (hypervolemic hyponatremia)   Secondary to cirrhosis physiology. Currently on salt tablets after being treated with 3% saline.  Continue fluid restriction.  Nephrologist following with recommendations to D/C salt tabs if/when sodium > 135.  Soft tissue rectal mass   Noted on CT scan. For colonoscopy in next 1-2 days.  Hypokalemia/hypomagnesemia   Likely from history of alcohol abuse, electrolyte depletion.   Monitor and replace electrolytes as needed.  Acute on chronic blood loss anemia   Hemoglobin steadily dropping since admission values. Continue to monitor.  No black/bloody stools reported.  No signs of active bleeding. As mentioned above, plan is for colonoscopy on Friday.   Transaminitis   From alcohol use, alcoholic hepatitis. AST >> ALT.   Severe protein calorie malnutrition   With underlying hypoalbuminemia in the setting of liver cirrhosis, alcohol abuse   Currently tolerating diet well, nutrition consultation requested.  DVT Prophylaxis   Lovenox subQ. Platelet count is WNL.  Code Status: Full.  Family Communication: Plan of care discussed with the patient and her family at the bedside 05/19/14. Disposition Plan: Home when stable.   IV Access:    PICC line placed 05/16/14.  Procedures and diagnostic studies:   Dg Chest 2 View 05/14/2014 Emphysema with small, right greater than left pleural effusions. Bibasilar  airspace disease is likely atelectatic although superimposed infection at the right base would be difficult to exclude.   Ct Chest W Contrast 05/14/2014 1. Moderate layering right pleural effusion with compression of the right lung, but no pneumonia. Small layering left pleural effusion. 2. Ascites, and nodular liver suggesting cirrhosis. No liver mass. 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 Mild porta hepatis lymphadenopathy likely reactive due to chronic liver disease. 3. Suggestion of a 3-4 cm soft tissue mass of the rectum partially narrowing the lumen about 6 cm upstream of the anal verge. Recommend colonoscopy. No surrounding lymphadenopathy. No distant metastatic disease. 4. Distended bladder. 5. Cholelithiasis. 6. Chronic appearing L1 and L2 compression fractures. 7. Calcified aortic atherosclerosis in the chest and abdomen.   Ct Abdomen Pelvis W Contrast 05/14/2014 1. Moderate layering right pleural effusion with compression of the right lung, but no pneumonia. Small layering left pleural effusion. 2. Ascites, and nodular liver suggesting cirrhosis. No liver mass. 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 Mild porta hepatis lymphadenopathy likely reactive due to chronic liver disease. 3. Suggestion of a 3-4 cm soft tissue mass of the rectum partially narrowing the lumen about 6 cm upstream of the anal verge. Recommend colonoscopy. No surrounding lymphadenopathy. No distant metastatic disease. 4. Distended bladder. 5. Cholelithiasis. 6. Chronic appearing L1 and L2 compression fractures. 7. Calcified aortic atherosclerosis in the chest and abdomen.   US Paracentesis 05/16/2014 Successful ultrasound guided paracentesis yielding 140 mL of ascites.   Dg Chest Port 1 View 05/17/2014: Left arm PICC tip in the lower SVC. Persistent bilateral pleural effusions with bibasilar atelectasis.      Medical  Consultants:    Nephrology (Dr. Roney Jaffe)   Gastroenterology (Dr. Scarlette Shorts)  Anti-Infectives:    Levaquin 10/20 -->   Subjective:   Cassidy Barrett feels well other than her soft tissue swelling.  No dyspnea.  No cough.  Bowels moved yesterday (x5).  No N/V.  Objective:    Filed Vitals:   05/19/14 2115 05/19/14 2203 05/20/14 0000 05/20/14 0517  BP: 105/56   104/62  Pulse: 100  86 88  Temp: 98.5 F (36.9 C)   98.2 F (36.8 C)  TempSrc: Oral   Oral  Resp: 18   15  Height:      Weight:      SpO2: 99% 100%  100%    Intake/Output Summary (Last 24 hours) at 05/20/14 0752 Last data filed at 05/19/14 1828  Gross per 24 hour  Intake    320 ml  Output    550 ml  Net   -230 ml    Exam: Gen:  NAD Cardiovascular:  RRR, No M/R/G Respiratory:  Lungs diminished in the bases. Gastrointestinal:  Abdomen soft, NT/ND, + BS Extremities:  Trace to 1+ BLE edema, UE edema noted   Data Reviewed:    Labs: Basic Metabolic Panel:  Recent Labs Lab 05/15/14 0510 05/15/14 0515  05/17/14 0400 05/17/14 0745 05/17/14 1618 05/18/14 0550  05/18/14 1600 05/19/14 05/19/14 0400 05/19/14 0845 05/20/14 0500  NA 118*  --   < > 117* 116* 120* 119*  < > 125* 124* 125* 123* 125*  K 2.8*  --   < > 3.1* 3.1* 3.9 3.6*  --   --   --  4.2  --  4.7  CL 71*  --   < > 77* 78* 82* 81*  --   --   --  90*  --  94*  CO2 38*  --   < > 32 31 29 28   --   --   --  27  --  26  GLUCOSE 122*  --   < > 97 116* 97 107*  --   --   --  84  --  86  BUN 6  --   < > 4* 4* 4* 4*  --   --   --  5*  --  5*  CREATININE 0.39*  --   < > 0.39* 0.38* 0.35* 0.35*  --   --   --  0.40*  --  0.37*  CALCIUM 7.6*  --   < > 7.3* 7.3* 7.6* 7.7*  --   --   --  7.5*  --  7.3*  MG  --  1.5  --  1.8  --   --  1.7  --   --   --   --   --   --   < > = values in this interval not displayed. GFR Estimated Creatinine Clearance: 51.5 ml/min (by C-G formula based on Cr of 0.37). Liver Function Tests:  Recent Labs Lab  05/14/14 2009 05/17/14 0400  AST 117* 84*  ALT 27 21  ALKPHOS 225* 175*  BILITOT 0.5 0.8  PROT 5.9* 5.1*  ALBUMIN 1.7* 1.5*   Coagulation profile  Recent Labs Lab 05/14/14 2009  INR 1.12    CBC:  Recent Labs Lab 05/14/14 1732 05/15/14 0510 05/17/14 0400 05/18/14 0550 05/19/14 0400 05/20/14 0500  WBC 21.7* 20.7* 11.4* 12.9* 11.8* 12.5*  NEUTROABS 20.4*  --   --   --   --   --   HGB 11.0* 10.4* 9.0* 9.0* 8.4* 8.3*  HCT 30.5* 28.7* 24.4* 25.4* 23.6* 23.4*  MCV 102.7* 102.1* 99.2 103.3* 102.2* 102.6*  PLT 452* 356 295 261 239 242   BNP (last 3 results)  Recent Labs  05/14/14 1732  PROBNP 297.9*   Sepsis Labs:  Recent Labs Lab 05/14/14 1732 05/14/14 1746  05/17/14 0400 05/18/14 0550 05/19/14 0400 05/20/14 0500  WBC 21.7*  --   < > 11.4* 12.9* 11.8* 12.5*  LATICACIDVEN  --  2.05  --   --   --   --   --   < > = values in this interval not displayed. Microbiology Recent Results (from the past 240 hour(s))  CULTURE, BLOOD (ROUTINE X 2)     Status: None   Collection Time    05/14/14  6:35 PM      Result Value Ref Range Status   Specimen Description BLOOD RIGHT ANTECUBITAL   Final   Special Requests BOTTLES DRAWN AEROBIC AND ANAEROBIC 3ML   Final   Culture  Setup Time     Final   Value: 05/15/2014 01:18     Performed at [facility]   Culture     Final   Value:        BLOOD CULTURE RECEIVED NO GROWTH TO DATE CULTURE WILL BE HELD FOR 5 DAYS BEFORE ISSUING A FINAL NEGATIVE REPORT     Performed at [facility]   Report Status PENDING   Incomplete  CULTURE, BLOOD (ROUTINE X 2)     Status: None   Collection Time    05/14/14  6:49 PM  Result Value Ref Range Status   Specimen Description BLOOD RIGHT HAND   Final   Special Requests BOTTLES DRAWN AEROBIC ONLY 3ML   Final   Culture  Setup Time     Final   Value: 05/15/2014 01:18     Performed at [facility]   Culture     Final   Value:        BLOOD CULTURE RECEIVED NO GROWTH TO  DATE CULTURE WILL BE HELD FOR 5 DAYS BEFORE ISSUING A FINAL NEGATIVE REPORT     Performed at [facility]   Report Status PENDING   Incomplete  MRSA PCR SCREENING     Status: None   Collection Time    05/14/14  8:54 PM      Result Value Ref Range Status   MRSA by PCR NEGATIVE  NEGATIVE Final   Comment:            The GeneXpert MRSA Assay (FDA     approved for NASAL specimens     only), is one component of a     comprehensive MRSA colonization     surveillance program. It is not     intended to diagnose MRSA     infection nor to guide or     monitor treatment for     MRSA infections.  URINE CULTURE     Status: None   Collection Time    05/14/14  9:15 PM      Result Value Ref Range Status   Specimen Description URINE, CLEAN CATCH   Final   Special Requests NONE   Final   Culture  Setup Time     Final   Value: 05/15/2014 01:52     Performed at [place]     Final   Value: NO GROWTH     Performed at [facility]   Culture     Final   Value: NO GROWTH     Performed at [facility]   Report Status 05/15/2014 FINAL   Final  BODY FLUID CULTURE     Status: None   Collection Time    05/16/14  2:53 PM      Result Value Ref Range Status   Specimen Description PLEURAL   Final   Special Requests Normal   Final   Gram Stain     Final   Value: RARE WBC PRESENT, PREDOMINANTLY MONONUCLEAR     NO ORGANISMS SEEN     Performed at [facility]   Culture     Final   Value: NO GROWTH 2 DAYS     Performed at [facility]   Report Status PENDING   Incomplete  CLOSTRIDIUM DIFFICILE BY PCR     Status: None   Collection Time    05/17/14  9:17 PM      Result Value Ref Range Status   C difficile by pcr NEGATIVE  NEGATIVE Final   Comment: Performed at [facility]     Medications:   . enoxaparin (LOVENOX) injection  40 mg Subcutaneous Q24H  . feeding supplement (ENSURE)  1 Container Oral BID BM  . folic acid  1 mg  Oral Daily  . levofloxacin (LEVAQUIN) IV  750 mg Intravenous Q24H  . multivitamin with minerals  1 tablet Oral Daily  . potassium chloride  20 mEq Oral BID  . sodium chloride  2 g Oral TID WC  . thiamine  100 mg Oral Daily   Or  . thiamine  100 mg Intravenous Daily   Continuous Infusions:   Time spent: 25 minutes.    LOS: 6 days   [facility] Pager [phone]. If unable to reach me by pager, please call my cell phone at [phone].  *Please refer to [email or web address], password [email or web address] to get updated schedule on who will round on this patient, as hospitalists switch teams weekly. If 7PM-7AM, please contact night-coverage at [email or web address], password [email or web address] for any overnight needs.  05/20/2014, 7:52 AM

## 2014-05-20 NOTE — Progress Notes (Signed)
NUTRITION FOLLOW UP  Intervention:   -D/C Ensure Pudding per patient request.  -Resource Breeze BID, each provides 250 kcal, 9 g protein -Will provide yogurt as a snack BID -Continue to encourage adequate PO intake of meals  Nutrition Dx:   Inadequate oral intake related to ETOH abuse/decreased appetite/weakness as evidenced by PO intake < 75% for > one month, ongoing, but improved  Goal:   Pt to meet >/= 90% of their estimated nutrition needs, not met  Monitor:   Protein/energy intake, labs, weights, supplement acceptance  Assessment:   Cassidy Barrett is a 68 y.o. female with a Past Medical History of ongoing tobacco abuse, EtOH use who presents today with the above noted complaint-possible pneumonia, electrolyte abnormalities   -Patient reports an improved appetite within the last few days, with PO intakes ~30% of meals. She has tried the Ensure pudding, but does not like it. She is willing to try Resource Breeze supplements. She also states that she likes yogurt.  -Sodium levels improved, but still low at 125.  -Weight is stable.  Height: Ht Readings from Last 1 Encounters:  05/18/14 $RemoveB'5\' 2"'[id number]$  (1.575 m)    Weight Status:   Wt Readings from Last 1 Encounters:  05/18/14 104 lb 1.6 oz (47.219 kg)    Re-estimated needs:  Kcal: 1450-1650  Protein: 60-75 gram  Fluid: 1000 ml per MD   Skin: nonpittng RLE and LLE edema  Diet Order: General   Intake/Output Summary (Last 24 hours) at 05/20/14 1209 Last data filed at 05/19/14 1828  Gross per 24 hour  Intake    120 ml  Output    350 ml  Net   -230 ml    Last BM: 10/24   Labs:   Recent Labs Lab 05/15/14 0515  05/17/14 0400  05/18/14 0550  05/19/14 0400 05/19/14 0845 05/20/14 0500  NA  --   < > 117*  < > 119*  < > 125* 123* 125*  K  --   < > 3.1*  < > 3.6*  --  4.2  --  4.7  CL  --   < > 77*  < > 81*  --  90*  --  94*  CO2  --   < > 32  < > 28  --  27  --  26  BUN  --   < > 4*  < > 4*  --  5*  --  5*   CREATININE  --   < > 0.39*  < > 0.35*  --  0.40*  --  0.37*  CALCIUM  --   < > 7.3*  < > 7.7*  --  7.5*  --  7.3*  MG 1.5  --  1.8  --  1.7  --   --   --   --   GLUCOSE  --   < > 97  < > 107*  --  84  --  86  < > = values in this interval not displayed.  CBG (last 3)  No results found for this basename: GLUCAP,  in the last 72 hours  Scheduled Meds: . enoxaparin (LOVENOX) injection  40 mg Subcutaneous Q24H  . feeding supplement (ENSURE)  1 Container Oral BID BM  . folic acid  1 mg Oral Daily  . levofloxacin (LEVAQUIN) IV  750 mg Intravenous Q24H  . multivitamin with minerals  1 tablet Oral Daily  . potassium chloride  20 mEq Oral BID  . sodium  chloride  2 g Oral TID WC  . thiamine  100 mg Oral Daily   Or  . thiamine  100 mg Intravenous Daily    Continuous Infusions:   Larey Seat, RD, LDN Pager #: [phone] After-Hours Pager #: [phone]

## 2014-05-20 NOTE — Evaluation (Signed)
Physical Therapy Evaluation Patient Details Name: BRYLIE SNEATH MRN: [id number] DOB: September 25, 1945 Today's Date: 05/20/2014   History of Present Illness  68 yo female admitted with hyponatremia, weakness. Hx of ETOH abuse.   Clinical Impression  On eval, pt required Min assist for mobility-able to ambulate ~150 feet with 1 HHA. Pt demonstrates general weaknes, decreased activity tolerance, and impaired gait and balance. Discussed d/c plan-plan is for home with HHPT. Pt is alone during the day. Recommend rollator/RW use for ambulation. Encouraged pt to discuss need for right knee xray with MD (if MD feel it is warranted to order). Pt was able to weightbear fairly and ROM was minimally painful.          Follow Up Recommendations Home health PT;Supervision - Intermittent    Equipment Recommendations   (4 wheeled walker/rollator)    Recommendations for Other Services       Precautions / Restrictions Precautions Precautions: Fall Restrictions Weight Bearing Restrictions: No      Mobility  Bed Mobility Overal bed mobility: Needs Assistance Bed Mobility: Supine to Sit;Sit to Supine     Supine to sit: Supervision Sit to supine: Supervision      Transfers Overall transfer level: Needs assistance Equipment used: 1 person hand held assist Transfers: Sit to/from Stand Sit to Stand: Min assist         General transfer comment: assist to rise, stabilize, control descent. vcs safety, technique, hand placement  Ambulation/Gait Ambulation/Gait assistance: Min assist Ambulation Distance (Feet): 150 Feet Assistive device: 1 person hand held assist Gait Pattern/deviations: Step-through pattern;Decreased stride length;Antalgic     General Gait Details: assist to stabilize throughout ambulation distance. Pt reported increased pain as distance increased. 1 standing rest break needed.   Stairs            Wheelchair Mobility    Modified Rankin (Stroke Patients Only)       Balance                                             Pertinent Vitals/Pain Pain Assessment: Faces Faces Pain Scale: Hurts little more Pain Location: r knee with ambulation Pain Descriptors / Indicators: Sore Pain Intervention(s): Limited activity within patient's tolerance;Monitored during session;Ice applied    Home Living Family/patient expects to be discharged to:: Private residence Living Arrangements: Spouse/significant other   Type of Home: House Home Access: Stairs to enter   Technical brewer of Steps: 1 Home Layout: One level Home Equipment: None      Prior Function Level of Independence: Independent               Hand Dominance        Extremity/Trunk Assessment   Upper Extremity Assessment: Generalized weakness           Lower Extremity Assessment: Generalized weakness;RLE deficits/detail RLE Deficits / Details: pt reports anterior knee pain since fall 1-2 weeks ago. Minimal swelling noted. ROM at least 90 degrees at the knee-pain free. Strength at least 3+/5 throughout. Pt able to weightbear.     Cervical / Trunk Assessment: Normal  Communication   Communication: No difficulties  Cognition Arousal/Alertness: Awake/alert Behavior During Therapy: WFL for tasks assessed/performed Overall Cognitive Status: Within Functional Limits for tasks assessed                      General Comments  Exercises        Assessment/Plan    PT Assessment Patient needs continued PT services  PT Diagnosis Difficulty walking;Generalized weakness;Acute pain   PT Problem List Decreased strength;Decreased range of motion;Decreased activity tolerance;Decreased balance;Decreased mobility;Decreased knowledge of use of DME;Pain  PT Treatment Interventions DME instruction;Gait training;Functional mobility training;Therapeutic activities;Therapeutic exercise;Patient/family education;Balance training   PT Goals (Current goals can  be found in the Care Plan section) Acute Rehab PT Goals Patient Stated Goal: home. to get stronger, steadier PT Goal Formulation: With patient/family Time For Goal Achievement: 06/03/14 Potential to Achieve Goals: Good    Frequency Min 3X/week   Barriers to discharge        Co-evaluation               End of Session Equipment Utilized During Treatment: Gait belt Activity Tolerance: Patient limited by pain;Patient limited by fatigue Patient left: in bed;with call bell/phone within reach;with family/visitor present           Time: 1453-1520 PT Time Calculation (min): 27 min   Charges:   PT Evaluation $Initial PT Evaluation Tier I: 1 Procedure PT Treatments $Gait Training: 8-22 mins   PT G Codes:          Weston Anna, MPT Pager: [phone]

## 2014-05-21 ENCOUNTER — Encounter (HOSPITAL_COMMUNITY): Payer: Self-pay | Admitting: *Deleted

## 2014-05-21 ENCOUNTER — Encounter (HOSPITAL_COMMUNITY)
Admission: EM | Disposition: A | Discharge status: Home / Self Care | Payer: Self-pay | Source: Home / Self Care | Attending: Internal Medicine

## 2014-05-21 DIAGNOSIS — K6389 Other specified diseases of intestine: Secondary | ICD-10-CM | POA: Diagnosis present

## 2014-05-21 DIAGNOSIS — K7030 Alcoholic cirrhosis of liver without ascites: Secondary | ICD-10-CM | POA: Diagnosis present

## 2014-05-21 DIAGNOSIS — Z86010 Personal history of colonic polyps: Secondary | ICD-10-CM

## 2014-05-21 DIAGNOSIS — K621 Rectal polyp: Secondary | ICD-10-CM | POA: Diagnosis present

## 2014-05-21 DIAGNOSIS — Z860101 Personal history of adenomatous and serrated colon polyps: Secondary | ICD-10-CM

## 2014-05-21 DIAGNOSIS — D128 Benign neoplasm of rectum: Secondary | ICD-10-CM

## 2014-05-21 HISTORY — DX: Personal history of adenomatous and serrated colon polyps: Z86.0101

## 2014-05-21 HISTORY — DX: Personal history of colonic polyps: Z86.010

## 2014-05-21 HISTORY — PX: FLEXIBLE SIGMOIDOSCOPY: SHX5431

## 2014-05-21 LAB — CULTURE, BLOOD (ROUTINE X 2)
Culture: NO GROWTH
Culture: NO GROWTH

## 2014-05-21 LAB — BASIC METABOLIC PANEL
ANION GAP: 7 (ref 5–15)
BUN: 5 mg/dL — ABNORMAL LOW (ref 6–23)
CALCIUM: 7.6 mg/dL — AB (ref 8.4–10.5)
CO2: 24 mEq/L (ref 19–32)
Chloride: 96 mEq/L (ref 96–112)
Creatinine, Ser: 0.4 mg/dL — ABNORMAL LOW (ref 0.50–1.10)
Glucose, Bld: 97 mg/dL (ref 70–99)
Potassium: 4.1 mEq/L (ref 3.7–5.3)
SODIUM: 127 meq/L — AB (ref 137–147)

## 2014-05-21 SURGERY — SIGMOIDOSCOPY, FLEXIBLE
Anesthesia: Moderate Sedation

## 2014-05-21 MED ORDER — DIPHENHYDRAMINE HCL 50 MG/ML IJ SOLN
INTRAMUSCULAR | Status: AC
  Filled 2014-05-21: qty 1

## 2014-05-21 MED ORDER — FENTANYL CITRATE 0.05 MG/ML IJ SOLN
INTRAMUSCULAR | Status: AC
  Filled 2014-05-21: qty 2

## 2014-05-21 MED ORDER — SODIUM CHLORIDE 0.9 % IV SOLN
INTRAVENOUS | Status: DC
  Administered 2014-05-21: 500 mL via INTRAVENOUS

## 2014-05-21 MED ORDER — MIDAZOLAM HCL 10 MG/2ML IJ SOLN
INTRAMUSCULAR | Status: DC | PRN
  Administered 2014-05-21: 1 mg via INTRAVENOUS
  Administered 2014-05-21: 2 mg via INTRAVENOUS

## 2014-05-21 MED ORDER — FENTANYL CITRATE 0.05 MG/ML IJ SOLN
INTRAMUSCULAR | Status: DC | PRN
  Administered 2014-05-21: 25 ug via INTRAVENOUS

## 2014-05-21 MED ORDER — MIDAZOLAM HCL 10 MG/2ML IJ SOLN
INTRAMUSCULAR | Status: AC
  Filled 2014-05-21: qty 4

## 2014-05-21 NOTE — Progress Notes (Signed)
     [facility] Progress Note  Subjective:  Ate well for breakfast this AM.  Has been having loose stools; Cdiff negative.  Feels much better compared to last week.   Objective:  Vital signs in last 24 hours: Temp:  [98 F (36.7 C)-98.7 F (37.1 C)] 98 F (36.7 C) (10/26 0511) Pulse Rate:  [80-105] 80 (10/26 0511) Resp:  [16] 16 (10/26 0511) BP: (99-106)/(60-66) 100/60 mmHg (10/26 0511) SpO2:  [97 %-100 %] 99 % (10/26 0511) Last BM Date: 05/20/14 General:  Alert, thin, in NAD Heart:  Regular rate and rhythm; no murmurs Pulm:  Decreased BS B/L. Abdomen:  Soft, non-distended. Normal bowel sounds.  Non-tender. Extremities:  1+ pitting edema noted in B/L LE's Neurologic:  Alert and  oriented x4;  grossly normal neurologically. Psych:  Alert and cooperative. Normal mood and affect.  Intake/Output from previous day: 10/25 0701 - 10/26 0700 In: 380 [P.O.:380] Out: -   Lab Results:  Recent Labs  05/19/14 0400 05/20/14 0500  WBC 11.8* 12.5*  HGB 8.4* 8.3*  HCT 23.6* 23.4*  PLT 239 242   BMET  Recent Labs  05/19/14 0400 05/19/14 0845 05/20/14 0500 05/21/14 0529  NA 125* 123* 125* 127*  K 4.2  --  4.7 4.1  CL 90*  --  94* 96  CO2 27  --  26 24  GLUCOSE 84  --  86 97  BUN 5*  --  5* 5*  CREATININE 0.40*  --  0.37* 0.40*  CALCIUM 7.5*  --  7.3* 7.6*   Assessment / Plan: 54. 68 year old female with rectal mass. Will perform sigmoidoscopy later today since sodium has been improved for the past two days.  Will give tap water enemas prior to procedure. 2. 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 She has a right pleural effusion which could be a hepatic hydrothorax.  No SBP by diagnostic paracentesis on 10/21.  No evidence for [facility] on imaging. Coags normal. Platelets normal.  3. Severe hyponatremia, Na 109 at one point, but has been improved for the past 2 days.  PO sodium chloride TID  and oral fluid restriction. 4. Acute respiratory failure/CAP/COPD exac/right pleural effusion  5. Leukocytosis, no SBP. She was apparently on outpatient steroids which be cause. Pulmonary related? On Levaquin. WBC normalizing.  6. Macrocytic anemia, hgb down 2 grams since admission but now stable (11 grams >>>9.0 grams). No overt bleeding. Anemia probably multifactorial (bone marrow suppression from ETOH, hemodilution, malnutrition, possibly some previously unrecognized bleeding from rectal mass). Monitor Hgb for now.    LOS: 7 days   ZEHR, JESSICA D.  05/21/2014, 8:51 AM  Pager number [phone]   [facility] Attending  I have also seen and assessed the patient and agree with the above note. Proceed with sigmoidoscopy today. The risks and benefits as well as alternatives of endoscopic procedure(s) have been discussed and reviewed. All questions answered. The patient agrees to proceed.  Gatha Mayer, MD, [facility]

## 2014-05-21 NOTE — Care Management Note (Signed)
CARE MANAGEMENT NOTE 05/21/2014  Patient:  Cassidy Barrett, Cassidy Barrett   Account Number:  [id number]  Date Initiated:  05/15/2014  Documentation initiated by:  DAVIS,RHONDA  Subjective/Objective Assessment:   nausea and vomiting for several day with electrolyte imbalancesSinus rhythm  Probable left atrial enlargement  Anterior infarct, age indeterminat     Action/Plan:   home when stable   Anticipated DC Date:  05/22/2014   Anticipated DC Plan:  [place]  In-house referral  NA      DC Planning Services  CM consult      [facility] Choice  HOME HEALTH   Choice offered to / List presented to:  C-1 Patient   DME arranged  Vassie Moselle      DME agency  [place] arranged  HH-1 RN  [place] agency  [place]   Status of service:  In process, will continue to follow Medicare Important Message given?  YES (If response is "NO", the following Medicare IM given date fields will be blank) Date Medicare IM given:  05/21/2014 Medicare IM given by:  Marney Doctor Date Additional Medicare IM given:   Additional Medicare IM given by:    Discharge Disposition:    Per UR Regulation:  Reviewed for med. necessity/level of care/duration of stay  If discussed at [place] of Stay Meetings, dates discussed:    Comments:  05/21/14 Marney Doctor RN,BSN,NCM PT recommending HHPT and rolling walker with 4 wheels.  Pt given choice and would like to use [facility].  Mary from [employer] called to give referral and [facility] dme rep called for walker.  MD asked for orders.  CM will continue to follow for DC needs.  [id number] l.Davis,RN,BSN,CCM: na remains low at 119/iv 3% na infusion continues/ patient to be transferred out of sdu [id number]/  05/15/2014/Rhonda L. Rosana Hoes, RN, BSN, CCM: Chart review for medical necessity and patient discharge needs. Case Manager will follow for patient condition changes.

## 2014-05-21 NOTE — Op Note (Addendum)
[facility] [place], [address]   FLEXIBLE SIGMOIDOSCOPY PROCEDURE REPORT  PATIENT: Keegan, Bensch  MR#: [id number] BIRTHDATE: 1945/08/02 , 78  yrs. old GENDER: female ENDOSCOPIST: Gatha Mayer, MD, [facility] PROCEDURE DATE:  05/21/2014 PROCEDURE:   Sigmoidoscopy with biopsy ASA CLASS:   Class III INDICATIONS:an abnormal CT. MEDICATIONS: Fentanyl 25 mcg IV and Versed 3 mg IV  DESCRIPTION OF PROCEDURE:   After the risks benefits and alternatives of the procedure were thoroughly explained, informed consent was obtained.  Digital exam revealed a palpable rectal mass. The     endoscope was introduced through the anus  and advanced to the sigmoid colon , The exam was Without limitations. The quality of the prep was The overall prep quality was good. . The instrument was then slowly withdrawn as the mucosa was fully examined.         COLON FINDINGS: A pedunculated polyp measuring 35 mm in size was found in the rectum.  Multiple biopsies were performed.  Sample was obtained and sent to histology.   The colon mucosa was otherwise normal.    Retroflexion was not performed.    The scope was then withdrawn from the patient and the procedure terminated.  COMPLICATIONS: There were no immediate complications.  ENDOSCOPIC IMPRESSION: 1.   Pedunculated polyp was found in the rectum; multiple biopsies were performed 2.   The colon mucosa was otherwise normal  RECOMMENDATIONS: 1.  Await biopsy results 2.  May be a candidate for endoscopic polypectomy - I think this is a massive pedunculated polyp and may not be a cancer. 3.  Would need a full colon prep so Na still needs to improve or at least stabilize.    eSigned:  Gatha Mayer, MD, [facility] 05/21/2014 2:28 PM Revised: 05/21/2014 2:28 PM

## 2014-05-21 NOTE — Progress Notes (Signed)
PT Cancellation Note  Patient Details Name: HILARI WETHINGTON MRN: [id number] DOB: [birth year]   Cancelled Treatment:    Reason Eval/Treat Not Completed: Patient at procedure or test/unavailable-will check back another day.    Weston Anna, MPT Pager: [phone]

## 2014-05-21 NOTE — Progress Notes (Signed)
Tap water enema administered x2 per order.  Patient tolerated procedure well, preped and ready for flex sig.

## 2014-05-21 NOTE — Progress Notes (Addendum)
Progress Note   Cassidy Barrett NTI:[id number] DOB: 1945-12-17 DOA: 05/14/2014 PCP: Imelda Pillow, NP   Brief Narrative:   Cassidy Barrett is an 68 y.o. female with a PMH of ongoing tobacco abuse, EtOH abuse who presented to [facility] ED 05/14/2014 with what initially started as cold / flu like symptoms 3 weeks prior to admission, decreased appetite, weakness. She was seen in primary care office and was given azithromycina and prednisone taper and CXR done 05/09/2014 apparently showed ride sided pleural effusion, questionable mass. She continued to experience weakness, cough (which is chronic but worse than usual). No fevers, chills. On admission, vitals were stable. CXR showed emphysema with small right greater than left pleural effusion, bibasilar airspace disease with possible superimposed infection at the right base. Patient received IV Rocephin in ED and we started Levaquin for treatment of possible pneumonia. Further imaging study included CT chest and abdomen which revealed moderate layering right pleural effusion with compression of the right lung, no pneumonia; ascites and possible liver cirrhosis, no liver mass, suggestion of a 3-4 cm soft tissue mass of the rectum partially narrowing the lumen, recommending colonoscopy. Blood work was significant for leukocytosis of 21.7 and sodium of 111, potassium 2.5, chloride 65. Hospital course is complicated by ongoing hyponatremia and need for 3% NaCl infusion. Renal is following.  For sigmoidoscopy today to evaluate rectal mass.   Assessment/Plan:   Principal Problem:  Acute respiratory failure with hypoxia / Community acquired pneumonia / COPD exacerbation / right pleural effusion / leukocytosis   Non-toxic appearing with stable respiratory status now. Continue Levaquin for a 7 day course. S/P course of prednisone as an outpatient.  Blood cultures so far show no growth.   Moderate layering pleural effusion on the right seen on CT chest,  persistent on F/U CXR. Consider thoracentesis.  Continue duoneb PRN.   Weaned off oxygen.  Oxygen saturations stable.  Active Problems:  Alcohol abuse / possible acute alcohol intoxication   No withdrawal.   Continue multivitamin, folic acid and thiamine.   Abdominal ascites in the setting of liver cirrhosis  Patient is status post paracentesis 05/16/2014 with 140 cc fluid drained and sent for analysis. Culture negative to date.   Hyponatremia (hypervolemic hyponatremia)   Secondary to cirrhosis physiology. Currently on salt tablets after being treated with 3% saline.  Continue fluid restriction.  Nephrologist following with recommendations to D/C salt tabs if/when sodium > 135. Sodium slowly trending up.  127 today.  Soft tissue rectal mass   Noted on CT scan. For sigmoidoscopy today.  Hypokalemia/hypomagnesemia   Likely from history of alcohol abuse, electrolyte depletion.   Monitor and replace electrolytes as needed.  Acute on chronic blood loss anemia   Hemoglobin steadily dropping since admission values. Continue to monitor.  No black/bloody stools reported.  No signs of active bleeding. Sigmoidoscopy today.   Transaminitis   From alcohol use, alcoholic hepatitis. AST >> ALT.   Severe protein calorie malnutrition   With underlying hypoalbuminemia in the setting of liver cirrhosis, alcohol abuse   Currently tolerating diet well, nutrition consultation requested.  DVT Prophylaxis   Lovenox subQ. Platelet count is WNL.  Code Status: Full.  Family Communication: Plan of care discussed with the patient and her family at the bedside 05/19/14. Disposition Plan: Home when stable.   IV Access:    PICC line placed 05/16/14.   Procedures and diagnostic studies:   Dg Chest 2 View 05/14/2014 Emphysema with small, right greater than  left pleural effusions. Bibasilar airspace disease is likely atelectatic although superimposed infection at the right base  would be difficult to exclude.   Ct Chest W Contrast 05/14/2014 1. Moderate layering right pleural effusion with compression of the right lung, but no pneumonia. Small layering left pleural effusion. 2. Ascites, and nodular liver suggesting cirrhosis. No liver mass. 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 Mild porta hepatis lymphadenopathy likely reactive due to chronic liver disease. 3. Suggestion of a 3-4 cm soft tissue mass of the rectum partially narrowing the lumen about 6 cm upstream of the anal verge. Recommend colonoscopy. No surrounding lymphadenopathy. No distant metastatic disease. 4. Distended bladder. 5. Cholelithiasis. 6. Chronic appearing L1 and L2 compression fractures. 7. Calcified aortic atherosclerosis in the chest and abdomen.   Ct Abdomen Pelvis W Contrast 05/14/2014 1. Moderate layering right pleural effusion with compression of the right lung, but no pneumonia. Small layering left pleural effusion. 2. Ascites, and nodular liver suggesting cirrhosis. No liver mass. 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 Mild porta hepatis lymphadenopathy likely reactive due to chronic liver disease. 3. Suggestion of a 3-4 cm soft tissue mass of the rectum partially narrowing the lumen about 6 cm upstream of the anal verge. Recommend colonoscopy. No surrounding lymphadenopathy. No distant metastatic disease. 4. Distended bladder. 5. Cholelithiasis. 6. Chronic appearing L1 and L2 compression fractures. 7. Calcified aortic atherosclerosis in the chest and abdomen.   US Paracentesis 05/16/2014 Successful ultrasound guided paracentesis yielding 140 mL of ascites.   Dg Chest Port 1 View 05/17/2014: Left arm PICC tip in the lower SVC. Persistent bilateral pleural effusions with bibasilar atelectasis.      Medical Consultants:    Nephrology (Dr. Roney Jaffe)   Gastroenterology (Dr. Scarlette Shorts)  Anti-Infectives:    Levaquin 10/20 -->05/21/14   Subjective:   Cassidy Barrett feels well.  Appetite good.  Working with PT, has some left knee pain with ambulation.  No dyspnea or cough.  No N/V.  Objective:    Filed Vitals:   05/20/14 1400 05/20/14 2148 05/20/14 2158 05/21/14 0511  BP: 106/60  99/66 100/60  Pulse: 92  105 80  Temp: 98.1 F (36.7 C)  98.7 F (37.1 C) 98 F (36.7 C)  TempSrc: Oral  Oral Oral  Resp: 16  16 16   Height:      Weight:      SpO2: 100% 97% 100% 99%    Intake/Output Summary (Last 24 hours) at 05/21/14 0757 Last data filed at 05/20/14 1820  Gross per 24 hour  Intake    380 ml  Output      0 ml  Net    380 ml    Exam: Gen:  NAD Cardiovascular:  RRR, No M/R/G Respiratory:  Lungs diminished in the bases. Gastrointestinal:  Abdomen soft, NT/ND, + BS Extremities:  1+ BLE edema, 1+ BUE edema noted   Data Reviewed:    Labs: Basic Metabolic Panel:  Recent Labs Lab 05/15/14 0510 05/15/14 0515  05/17/14 0400  05/17/14 1618 05/18/14 0550  05/19/14 05/19/14 0400 05/19/14 0845 05/20/14 0500 05/21/14 0529  NA 118*  --   < > 117*  < > 120* 119*  < > 124* 125* 123* 125* 127*  K 2.8*  --   < > 3.1*  < > 3.9 3.6*  --   --  4.2  --  4.7 4.1  CL 71*  --   < >  77*  < > 82* 81*  --   --  90*  --  94* 96  CO2 38*  --   < > 32  < > 29 28  --   --  27  --  26 24  GLUCOSE 122*  --   < > 97  < > 97 107*  --   --  84  --  86 97  BUN 6  --   < > 4*  < > 4* 4*  --   --  5*  --  5* 5*  CREATININE 0.39*  --   < > 0.39*  < > 0.35* 0.35*  --   --  0.40*  --  0.37* 0.40*  CALCIUM 7.6*  --   < > 7.3*  < > 7.6* 7.7*  --   --  7.5*  --  7.3* 7.6*  MG  --  1.5  --  1.8  --   --  1.7  --   --   --   --   --   --   < > = values in this interval not displayed. GFR Estimated Creatinine Clearance: 51.5 ml/min (by C-G formula based on Cr of 0.4). Liver Function Tests:  Recent Labs Lab 05/14/14 2009 05/17/14 0400  AST 117* 84*  ALT 27 21  ALKPHOS  225* 175*  BILITOT 0.5 0.8  PROT 5.9* 5.1*  ALBUMIN 1.7* 1.5*   Coagulation profile  Recent Labs Lab 05/14/14 2009  INR 1.12    CBC:  Recent Labs Lab 05/14/14 1732 05/15/14 0510 05/17/14 0400 05/18/14 0550 05/19/14 0400 05/20/14 0500  WBC 21.7* 20.7* 11.4* 12.9* 11.8* 12.5*  NEUTROABS 20.4*  --   --   --   --   --   HGB 11.0* 10.4* 9.0* 9.0* 8.4* 8.3*  HCT 30.5* 28.7* 24.4* 25.4* 23.6* 23.4*  MCV 102.7* 102.1* 99.2 103.3* 102.2* 102.6*  PLT 452* 356 295 261 239 242   BNP (last 3 results)  Recent Labs  05/14/14 1732  PROBNP 297.9*   Sepsis Labs:  Recent Labs Lab 05/14/14 1732 05/14/14 1746  05/17/14 0400 05/18/14 0550 05/19/14 0400 05/20/14 0500  WBC 21.7*  --   < > 11.4* 12.9* 11.8* 12.5*  LATICACIDVEN  --  2.05  --   --   --   --   --   < > = values in this interval not displayed. Microbiology Recent Results (from the past 240 hour(s))  CULTURE, BLOOD (ROUTINE X 2)     Status: None   Collection Time    05/14/14  6:35 PM      Result Value Ref Range Status   Specimen Description BLOOD RIGHT ANTECUBITAL   Final   Special Requests BOTTLES DRAWN AEROBIC AND ANAEROBIC 3ML   Final   Culture  Setup Time     Final   Value: 05/15/2014 01:18     Performed at [facility]   Culture     Final   Value:        BLOOD CULTURE RECEIVED NO GROWTH TO DATE CULTURE WILL BE HELD FOR 5 DAYS BEFORE ISSUING A FINAL NEGATIVE REPORT     Performed at [facility]   Report Status PENDING   Incomplete  CULTURE, BLOOD (ROUTINE X 2)     Status: None   Collection Time    05/14/14  6:49 PM      Result Value Ref Range Status   Specimen Description  BLOOD RIGHT HAND   Final   Special Requests BOTTLES DRAWN AEROBIC ONLY 3ML   Final   Culture  Setup Time     Final   Value: 05/15/2014 01:18     Performed at [facility]   Culture     Final   Value:        BLOOD CULTURE RECEIVED NO GROWTH TO DATE CULTURE WILL BE HELD FOR 5 DAYS BEFORE ISSUING A FINAL NEGATIVE  REPORT     Performed at [facility]   Report Status PENDING   Incomplete  MRSA PCR SCREENING     Status: None   Collection Time    05/14/14  8:54 PM      Result Value Ref Range Status   MRSA by PCR NEGATIVE  NEGATIVE Final   Comment:            The GeneXpert MRSA Assay (FDA     approved for NASAL specimens     only), is one component of a     comprehensive MRSA colonization     surveillance program. It is not     intended to diagnose MRSA     infection nor to guide or     monitor treatment for     MRSA infections.  URINE CULTURE     Status: None   Collection Time    05/14/14  9:15 PM      Result Value Ref Range Status   Specimen Description URINE, CLEAN CATCH   Final   Special Requests NONE   Final   Culture  Setup Time     Final   Value: 05/15/2014 01:52     Performed at [place]     Final   Value: NO GROWTH     Performed at [facility]   Culture     Final   Value: NO GROWTH     Performed at [facility]   Report Status 05/15/2014 FINAL   Final  BODY FLUID CULTURE     Status: None   Collection Time    05/16/14  2:53 PM      Result Value Ref Range Status   Specimen Description PLEURAL   Final   Special Requests Normal   Final   Gram Stain     Final   Value: RARE WBC PRESENT, PREDOMINANTLY MONONUCLEAR     NO ORGANISMS SEEN     Performed at [facility]   Culture     Final   Value: NO GROWTH 3 DAYS     Performed at [facility]   Report Status 05/20/2014 FINAL   Final  CLOSTRIDIUM DIFFICILE BY PCR     Status: None   Collection Time    05/17/14  9:17 PM      Result Value Ref Range Status   C difficile by pcr NEGATIVE  NEGATIVE Final   Comment: Performed at [facility]     Medications:   . enoxaparin (LOVENOX) injection  40 mg Subcutaneous Q24H  . feeding supplement (RESOURCE BREEZE)  1 Container Oral BID BM  . folic acid  1 mg Oral Daily  . multivitamin with minerals  1 tablet Oral  Daily  . potassium chloride  20 mEq Oral BID  . sodium chloride  2 g Oral TID WC  . thiamine  100 mg Oral Daily   Or  . thiamine  100 mg Intravenous Daily  Continuous Infusions:   Time spent: 25 minutes.    LOS: 7 days   [facility] Pager [phone]. If unable to reach me by pager, please call my cell phone at [phone].  *Please refer to [email or web address], password [email or web address] to get updated schedule on who will round on this patient, as hospitalists switch teams weekly. If 7PM-7AM, please contact night-coverage at [email or web address], password [email or web address] for any overnight needs.  05/21/2014, 7:57 AM

## 2014-05-22 ENCOUNTER — Encounter (HOSPITAL_COMMUNITY): Payer: Self-pay | Admitting: Internal Medicine

## 2014-05-22 DIAGNOSIS — K621 Rectal polyp: Secondary | ICD-10-CM

## 2014-05-22 LAB — COMPREHENSIVE METABOLIC PANEL
ALK PHOS: 155 U/L — AB (ref 39–117)
ALT: 15 U/L (ref 0–35)
ANION GAP: 8 (ref 5–15)
AST: 44 U/L — ABNORMAL HIGH (ref 0–37)
Albumin: 1.6 g/dL — ABNORMAL LOW (ref 3.5–5.2)
BILIRUBIN TOTAL: 0.6 mg/dL (ref 0.3–1.2)
BUN: 5 mg/dL — ABNORMAL LOW (ref 6–23)
CO2: 23 mEq/L (ref 19–32)
Calcium: 7.8 mg/dL — ABNORMAL LOW (ref 8.4–10.5)
Chloride: 99 mEq/L (ref 96–112)
Creatinine, Ser: 0.38 mg/dL — ABNORMAL LOW (ref 0.50–1.10)
GFR calc non Af Amer: >90 mL/min (ref >90)
GLUCOSE: 76 mg/dL (ref 70–99)
POTASSIUM: 4.7 meq/L (ref 3.7–5.3)
SODIUM: 130 meq/L — AB (ref 137–147)
TOTAL PROTEIN: 5.3 g/dL — AB (ref 6.0–8.3)

## 2014-05-22 LAB — CBC
HEMATOCRIT: 24.3 % — AB (ref 36.0–46.0)
Hemoglobin: 8.4 g/dL — ABNORMAL LOW (ref 12.0–15.0)
MCH: 36.4 pg — AB (ref 26.0–34.0)
MCHC: 34.6 g/dL (ref 30.0–36.0)
MCV: 105.2 fL — ABNORMAL HIGH (ref 78.0–100.0)
Platelets: 229 10*3/uL (ref 150–400)
RBC: 2.31 MIL/uL — ABNORMAL LOW (ref 3.87–5.11)
RDW: 13.5 % (ref 11.5–15.5)
WBC: 14 10*3/uL — ABNORMAL HIGH (ref 4.0–10.5)

## 2014-05-22 MED ORDER — SODIUM CHLORIDE 0.9 % IJ SOLN
10.0000 mL | INTRAMUSCULAR | Status: DC | PRN
  Administered 2014-05-22 (×2): 10 mL

## 2014-05-22 NOTE — Progress Notes (Signed)
Physical Therapy Treatment Patient Details Name: Cassidy Barrett MRN: [id number] DOB: 27-May-1946 Today's Date: 05/22/2014    History of Present Illness 68 yo female admitted with hyponatremia, weakness. Hx of ETOH abuse.     PT Comments    Pt report feeling fatigue, weaker on today. Mod encouragement for participation. Discussed d/c plan again-pt still plans to d/c home. States she will have assist during the day, if needed.   Follow Up Recommendations  Home health PT;Supervision - Intermittent     Equipment Recommendations  None recommended by PT    Recommendations for Other Services       Precautions / Restrictions Precautions Precautions: Fall Restrictions Weight Bearing Restrictions: No    Mobility  Bed Mobility Overal bed mobility: Needs Assistance Bed Mobility: Supine to Sit;Sit to Supine     Supine to sit: Min guard Sit to supine: Min guard   General bed mobility comments: close guard for safety. Increased time. Encouragement for pt to do as much as she could.   Transfers Overall transfer level: Needs assistance Equipment used: 4-wheeled walker Transfers: Sit to/from Stand Sit to Stand: Min assist         General transfer comment: assist to rise, stabilize, control descent. vcs safety, technique, hand placement,  proper use of rollator  Ambulation/Gait Ambulation/Gait assistance: Min guard Ambulation Distance (Feet): 75 Feet (x2) Assistive device: 4-wheeled walker Gait Pattern/deviations: Step-through pattern;Decreased stride length;Trunk flexed     General Gait Details: close guard for safety. 1 seated rest break taken due to fatigue.    Stairs            Wheelchair Mobility    Modified Rankin (Stroke Patients Only)       Balance                                    Cognition Arousal/Alertness: Awake/alert Behavior During Therapy: WFL for tasks assessed/performed Overall Cognitive Status: Within Functional Limits for  tasks assessed                      Exercises      General Comments        Pertinent Vitals/Pain Pain Assessment: No/denies pain    Home Living                      Prior Function            PT Goals (current goals can now be found in the care plan section) Progress towards PT goals: Progressing toward goals    Frequency  Min 3X/week    PT Plan Current plan remains appropriate    Co-evaluation             End of Session Equipment Utilized During Treatment: Gait belt Activity Tolerance: Patient tolerated treatment well Patient left: in bed;with call bell/phone within reach     Time: 9741-6384 PT Time Calculation (min): 20 min  Charges:  $Gait Training: 8-22 mins                    G Codes:      Weston Anna, MPT Pager: [phone]

## 2014-05-22 NOTE — Progress Notes (Signed)
Patient had flex sig on 10/26 with large pedunculated polyp found in the rectum; multiple biopsies were performed.  Currently awaiting pathology/biopsy results.  Patient may be a candidate for endoscopic resection of the polyp and would need to be prepped for full colonoscopy.  Her sodium remains improved and is 130 today.  WBC count is up some again today to 14; she is no longer on antibiotics.  Diagnostic paracentesis negative for SBP on 10/21.

## 2014-05-22 NOTE — Progress Notes (Signed)
Agree with Ms. Alphia Kava management. Hopefully path out tomorrow and we can determine next step.  Gatha Mayer, MD, [facility]

## 2014-05-22 NOTE — Progress Notes (Signed)
   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 Will prep for colonoscopy tomorrow and do colonoscopy Thurs. Given hyponatremia, etc and co-morbidities overall better to keep her until then. If all goes well then dc Thurs vs. Fri.  Have ordered the colonoscopy but not prep. Start clears tomorrow (ordered) Will see her tomorrow also  Gatha Mayer, MD, [facility] [phone] (pager) 05/22/2014 7:32 PM

## 2014-05-22 NOTE — Plan of Care (Signed)
Problem: Phase III Progression Outcomes Goal: Voiding independently Outcome: Progressing Patient requires one assist with transfers and with toileting related to generalized weakness.

## 2014-05-22 NOTE — Progress Notes (Signed)
Patient ID: Cassidy Barrett, female   DOB: 1945-08-26, 68 y.o.   MRN: [id number] [facility] PROGRESS NOTE  Cassidy Barrett [id number]:[id number] DOB: 11/11/1945 DOA: 05/14/2014 PCP: Imelda Pillow, NP  Brief narrative: 68 y.o. female with a PMH of ongoing tobacco abuse, EtOH abuse who presented to [facility] ED 05/14/2014 with what initially started as cold / flu like symptoms 3 weeks prior to admission, decreased appetite, weakness. She was seen in primary care office and was given azithromycina and prednisone taper and CXR done 05/09/2014 apparently showed ride sided pleural effusion, questionable mass. She continued to experience weakness, cough (which is chronic but worse than usual). No fevers, chills. On admission, vitals were stable. CXR showed emphysema with small right greater than left pleural effusion, bibasilar airspace disease with possible superimposed infection at the right base. Patient received IV Rocephin in ED and we started Levaquin for treatment of possible pneumonia. Further imaging study included CT chest and abdomen which revealed moderate layering right pleural effusion with compression of the right lung, no pneumonia; ascites and possible liver cirrhosis, no liver mass, suggestion of a 3-4 cm soft tissue mass of the rectum partially narrowing the lumen, recommending colonoscopy. Blood work was significant for leukocytosis of 21.7 and sodium of 111, potassium 2.5, chloride 65. Hospital course is complicated by ongoing hyponatremia and need for 3% NaCl infusion. Renal is following. Status post sigmoidoscopy 05/21/2014, biopsy results pending.  Assessment/Plan:   Principal Problem:  Acute respiratory failure with hypoxia / Community acquired pneumonia / COPD exacerbation / right pleural effusion / leukocytosis  Respiratory status remains stable. Patient completed 7 days of Levaquin. S/P course of prednisone as an outpatient.  Moderate layering pleural effusion on the right seen on CT  chest, persistent on F/U CXR 05/17/2014. Consider thoracentesis if respiratory status worsens.  Blood cultures so far show no growth.  Continue nebulizer treatment as needed for shortness of breath or wheezing.  Oxygen support via [place] to keep O2 sats above 90%. Active Problems:  Alcohol abuse / possible acute alcohol intoxication  No reports of withdrawals during this hospital stay.  Continue multivitamin, folic acid and thiamine.  Abdominal ascites in the setting of liver cirrhosis  Patient is status post paracentesis 05/16/2014 with 140 cc fluid drained and sent for analysis. Culture negative to date. Cytology showed reactive mesothelial cells. Hyponatremia (hypervolemic hyponatremia)  Secondary to cirrhosis physiology. Currently on salt tablets after being treated with 3% saline.  Continue fluid restriction.  Nephrologist following with recommendations to D/C salt tabs if/when sodium > 135. Sodium slowly trending up, 130 this morning. Soft tissue rectal mass  Noted on CT scan. Status post sigmoidoscopy on 05/21/2014, biopsy results pending.  Hypokalemia/hypomagnesemia  Likely from history of alcohol abuse, electrolyte depletion.  Potassium WNL. Acute on chronic blood loss anemia / anemia of chronic disease Hemoglobin steadily dropping since admission values. Anemia also from chronic disease, liver cirrhosis. No signs of active bleed. No current indications for transfusion.  Transaminitis  From alcohol use, alcoholic hepatitis. AST >> ALT.  Severe protein calorie malnutrition  With underlying hypoalbuminemia in the setting of liver cirrhosis, alcohol abuse  Currently tolerating diet well, nutrition consultation requested. DVT Prophylaxis  Lovenox subQ. Platelet count is WNL.   Code Status: Full.  Family Communication: Plan of care discussed with the patient and her family at the bedside 05/19/14.  Disposition Plan: remains inpatient.   IV Access:   PICC line placed  05/16/14. Procedures and diagnostic studies:   Dg Chest  2 View 05/14/2014 Emphysema with small, right greater than left pleural effusions. Bibasilar airspace disease is likely atelectatic although superimposed infection at the right base would be difficult to exclude.  Ct Chest W Contrast 05/14/2014 1. Moderate layering right pleural effusion with compression of the right lung, but no pneumonia. Small layering left pleural effusion. 2. Ascites, and nodular liver suggesting cirrhosis. No liver mass. 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 Mild porta hepatis lymphadenopathy likely reactive due to chronic liver disease. 3. Suggestion of a 3-4 cm soft tissue mass of the rectum partially narrowing the lumen about 6 cm upstream of the anal verge. Recommend colonoscopy. No surrounding lymphadenopathy. No distant metastatic disease. 4. Distended bladder. 5. Cholelithiasis. 6. Chronic appearing L1 and L2 compression fractures. 7. Calcified aortic atherosclerosis in the chest and abdomen.  Ct Abdomen Pelvis W Contrast 05/14/2014 1. Moderate layering right pleural effusion with compression of the right lung, but no pneumonia. Small layering left pleural effusion. 2. Ascites, and nodular liver suggesting cirrhosis. No liver mass. 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 Mild porta hepatis lymphadenopathy likely reactive due to chronic liver disease. 3. Suggestion of a 3-4 cm soft tissue mass of the rectum partially narrowing the lumen about 6 cm upstream of the anal verge. Recommend colonoscopy. No surrounding lymphadenopathy. No distant metastatic disease. 4. Distended bladder. 5. Cholelithiasis. 6. Chronic appearing L1 and L2 compression fractures. 7. Calcified aortic atherosclerosis in the chest and abdomen.  US Paracentesis 05/16/2014 Successful ultrasound guided paracentesis yielding 140 mL of ascites.  Dg Chest  Port 1 View 05/17/2014: Left arm PICC tip in the lower SVC. Persistent bilateral pleural effusions with bibasilar atelectasis.  Medical Consultants:   Nephrology (Dr. Roney Jaffe)  Gastroenterology (Dr. Scarlette Shorts) Anti-Infectives:   Levaquin 10/20 -->05/21/14    Leisa Lenz, MD  [facility] Pager [phone]  If 7PM-7AM, please contact night-coverage [email or web address] Password [email or web address] 05/22/2014, 3:35 PM   LOS: 8 days    HPI/Subjective: No acute overnight events.  Objective: Filed Vitals:   05/21/14 2154 05/22/14 0522 05/22/14 0937 05/22/14 1420  BP: 105/71 106/65  95/49  Pulse: 102 100  106  Temp: 98 F (36.7 C) 97.3 F (36.3 C)  98.5 F (36.9 C)  TempSrc: Oral Oral  Oral  Resp: 22 20  20   Height:      Weight:      SpO2: 100% 100% 99% 99%    Intake/Output Summary (Last 24 hours) at 05/22/14 1535 Last data filed at 05/22/14 1420  Gross per 24 hour  Intake    120 ml  Output      0 ml  Net    120 ml    Exam:   General:  Pt is alert, follows commands appropriately, not in acute distress  Cardiovascular: tachycardic, S1/S2, no murmurs  Respiratory: no wheezing, no crackles, no rhonchi  Abdomen: Soft, non tender, non distended, bowel sounds present  Extremities: +1 LE pitting edema, pulses DP and PT palpable bilaterally  Neuro: Grossly nonfocal  Data Reviewed: Basic Metabolic Panel:  Recent Labs Lab 05/17/14 0400  05/18/14 0550  05/19/14 0400 05/19/14 0845 05/20/14 0500 05/21/14 0529 05/22/14 0530  NA 117*  < > 119*  < > 125* 123* 125* 127* 130*  K 3.1*  < > 3.6*  --  4.2  --  4.7 4.1 4.7  CL 77*  < > 81*  --  90*  --  94* 96 99  CO2 32  < > 28  --  27  --  26 24 23   GLUCOSE 97  < > 107*  --  84  --  86 97 76  BUN 4*  < > 4*  --  5*  --  5* 5* 5*  CREATININE 0.39*  < > 0.35*  --  0.40*  --  0.37* 0.40* 0.38*  CALCIUM 7.3*  < > 7.7*  --  7.5*  --  7.3* 7.6* 7.8*  MG 1.8  --  1.7  --   --   --   --   --   --   < > = values in this  interval not displayed. Liver Function Tests:  Recent Labs Lab 05/17/14 0400 05/22/14 0530  AST 84* 44*  ALT 21 15  ALKPHOS 175* 155*  BILITOT 0.8 0.6  PROT 5.1* 5.3*  ALBUMIN 1.5* 1.6*   No results found for this basename: LIPASE, AMYLASE,  in the last 168 hours No results found for this basename: AMMONIA,  in the last 168 hours CBC:  Recent Labs Lab 05/17/14 0400 05/18/14 0550 05/19/14 0400 05/20/14 0500 05/22/14 0530  WBC 11.4* 12.9* 11.8* 12.5* 14.0*  HGB 9.0* 9.0* 8.4* 8.3* 8.4*  HCT 24.4* 25.4* 23.6* 23.4* 24.3*  MCV 99.2 103.3* 102.2* 102.6* 105.2*  PLT 295 261 239 242 229   Cardiac Enzymes: No results found for this basename: CKTOTAL, CKMB, CKMBINDEX, TROPONINI,  in the last 168 hours BNP: No components found with this basename: POCBNP,  CBG: No results found for this basename: GLUCAP,  in the last 168 hours  Recent Results (from the past 240 hour(s))  CULTURE, BLOOD (ROUTINE X 2)     Status: None   Collection Time    05/14/14  6:35 PM      Result Value Ref Range Status   Specimen Description BLOOD RIGHT ANTECUBITAL   Final   Special Requests BOTTLES DRAWN AEROBIC AND ANAEROBIC 3ML   Final   Culture  Setup Time     Final   Value: 05/15/2014 01:18     Performed at [place]     Final   Value: NO GROWTH 5 DAYS     Performed at [facility]   Report Status 05/21/2014 FINAL   Final  CULTURE, BLOOD (ROUTINE X 2)     Status: None   Collection Time    05/14/14  6:49 PM      Result Value Ref Range Status   Specimen Description BLOOD RIGHT HAND   Final   Special Requests BOTTLES DRAWN AEROBIC ONLY 3ML   Final   Culture  Setup Time     Final   Value: 05/15/2014 01:18     Performed at [place]     Final   Value: NO GROWTH 5 DAYS     Performed at [facility]   Report Status 05/21/2014 FINAL   Final  MRSA PCR SCREENING     Status: None   Collection Time    05/14/14  8:54 PM      Result Value Ref  Range Status   MRSA by PCR NEGATIVE  NEGATIVE Final   Comment:            The GeneXpert MRSA Assay (FDA     approved for NASAL specimens     only), is one component of a     comprehensive MRSA colonization  surveillance program. It is not     intended to diagnose MRSA     infection nor to guide or     monitor treatment for     MRSA infections.  URINE CULTURE     Status: None   Collection Time    05/14/14  9:15 PM      Result Value Ref Range Status   Specimen Description URINE, CLEAN CATCH   Final   Special Requests NONE   Final   Culture  Setup Time     Final   Value: 05/15/2014 01:52     Performed at [place]     Final   Value: NO GROWTH     Performed at [facility]   Culture     Final   Value: NO GROWTH     Performed at [facility]   Report Status 05/15/2014 FINAL   Final  BODY FLUID CULTURE     Status: None   Collection Time    05/16/14  2:53 PM      Result Value Ref Range Status   Specimen Description PLEURAL   Final   Special Requests Normal   Final   Gram Stain     Final   Value: RARE WBC PRESENT, PREDOMINANTLY MONONUCLEAR     NO ORGANISMS SEEN     Performed at [facility]   Culture     Final   Value: NO GROWTH 3 DAYS     Performed at [facility]   Report Status 05/20/2014 FINAL   Final  CLOSTRIDIUM DIFFICILE BY PCR     Status: None   Collection Time    05/17/14  9:17 PM      Result Value Ref Range Status   C difficile by pcr NEGATIVE  NEGATIVE Final   Comment: Performed at [facility]     Scheduled Meds: . enoxaparin (LOVENOX) injection  40 mg Subcutaneous Q24H  . feeding supplement (RESOURCE BREEZE)  1 Container Oral BID BM  . folic acid  1 mg Oral Daily  . multivitamin with minerals  1 tablet Oral Daily  . potassium chloride  20 mEq Oral BID  . thiamine  100 mg Oral Daily   Or  . thiamine  100 mg Intravenous Daily

## 2014-05-23 LAB — BASIC METABOLIC PANEL
Anion gap: 9 (ref 5–15)
BUN: 5 mg/dL — AB (ref 6–23)
CALCIUM: 8.1 mg/dL — AB (ref 8.4–10.5)
CHLORIDE: 99 meq/L (ref 96–112)
CO2: 24 mEq/L (ref 19–32)
CREATININE: 0.37 mg/dL — AB (ref 0.50–1.10)
GFR calc non Af Amer: >90 mL/min (ref >90)
Glucose, Bld: 88 mg/dL (ref 70–99)
Potassium: 4.3 mEq/L (ref 3.7–5.3)
Sodium: 132 mEq/L — ABNORMAL LOW (ref 137–147)

## 2014-05-23 LAB — CBC
HCT: 25.2 % — ABNORMAL LOW (ref 36.0–46.0)
HEMOGLOBIN: 8.6 g/dL — AB (ref 12.0–15.0)
MCH: 35.5 pg — AB (ref 26.0–34.0)
MCHC: 34.1 g/dL (ref 30.0–36.0)
MCV: 104.1 fL — ABNORMAL HIGH (ref 78.0–100.0)
Platelets: 250 10*3/uL (ref 150–400)
RBC: 2.42 MIL/uL — ABNORMAL LOW (ref 3.87–5.11)
RDW: 13.4 % (ref 11.5–15.5)
WBC: 13 10*3/uL — ABNORMAL HIGH (ref 4.0–10.5)

## 2014-05-23 MED ORDER — PEG-KCL-NACL-NASULF-NA ASC-C 100 G PO SOLR: 1.0000 | Freq: Once | ORAL | Status: DC

## 2014-05-23 MED ORDER — PEG-KCL-NACL-NASULF-NA ASC-C 100 G PO SOLR
0.5000 | Freq: Once | ORAL | Status: AC
  Administered 2014-05-23: 100 g via ORAL
  Filled 2014-05-23: qty 1

## 2014-05-23 MED ORDER — PEG-KCL-NACL-NASULF-NA ASC-C 100 G PO SOLR
0.5000 | Freq: Once | ORAL | Status: AC
  Administered 2014-05-23: 100 g via ORAL

## 2014-05-23 NOTE — Progress Notes (Addendum)
Patient ID: Cassidy Barrett, female   DOB: 1945/09/09, 68 y.o.   MRN: [id number] [facility] PROGRESS NOTE  Cassidy Barrett SVX:[id number] DOB: Mar 03, 1946 DOA: 05/14/2014 PCP: Imelda Pillow, NP  Brief narrative: 68 y.o. female with a PMH of ongoing tobacco abuse, EtOH abuse who presented to [facility] ED 05/14/2014 with what initially started as cold / flu like symptoms 3 weeks prior to admission, decreased appetite, weakness. She was seen in primary care office and was given azithromycina and prednisone taper and CXR done 05/09/2014 apparently showed ride sided pleural effusion, questionable mass. She continued to experience weakness, cough (which is chronic but worse than usual). No fevers, chills. On admission, vitals were stable. CXR showed emphysema with small right greater than left pleural effusion, bibasilar airspace disease with possible superimposed infection at the right base. Patient received IV Rocephin in ED and we started Levaquin for treatment of possible pneumonia. Further imaging study included CT chest and abdomen which revealed moderate layering right pleural effusion with compression of the right lung, no pneumonia; ascites and possible liver cirrhosis, no liver mass, suggestion of a 3-4 cm soft tissue mass of the rectum partially narrowing the lumen, recommending colonoscopy. Blood work was significant for leukocytosis of 21.7 and sodium of 111, potassium 2.5, chloride 65. Hospital course is complicated by ongoing hyponatremia and need for 3% NaCl infusion. Renal is following. Status post sigmoidoscopy 05/21/2014, biopsy results consistent with tubovillous adenoma. Plan for colonoscopy in am.  Assessment/Plan:   Principal Problem:  Acute respiratory failure with hypoxia / Community acquired pneumonia / COPD exacerbation / right pleural effusion / leukocytosis  Respiratory status stable. Patient completed 7 days of Levaquin. S/P course of prednisone as an outpatient.  Moderate  layering pleural effusion on the right seen on CT chest, persistent on F/U CXR 05/17/2014. No need for thoracentesis at this time. Good O2 sats and no respiratory distress. Blood cultures so far show no growth.  Continue nebulizer treatment as needed for shortness of breath or wheezing.   Active Problems:  Alcohol abuse / possible acute alcohol intoxication  No reports of withdrawals during this hospital stay.  Continue multivitamin, folic acid and thiamine.  Abdominal ascites in the setting of liver cirrhosis  Patient is status post paracentesis 05/16/2014 with 140 cc fluid drained and sent for analysis. Culture negative to date. Cytology showed reactive mesothelial cells. Hyponatremia (hypervolemic hyponatremia)  Secondary to cirrhosis physiology. Currently on salt tablets after being treated with 3% saline.  Continue fluid restriction.  Nephrologist following with recommendations to D/C salt tabs if/when sodium > 135. Sodium trending up, 132 this morning. Soft tissue rectal mass  Noted on CT scan. Status post sigmoidoscopy on 05/21/2014, biopsy results consistent with tubovillous adenoma. Plan for colonoscopy in am. Hypokalemia/hypomagnesemia  Likely from history of alcohol abuse, electrolyte depletion.  Potassium WNL. Acute on chronic blood loss anemia / anemia of chronic disease  Hemoglobin steadily dropping since admission values. Anemia also from chronic disease, liver cirrhosis.  Hemoglobin 8.4. No current indications for transfusion.  Transaminitis  From alcohol use, alcoholic hepatitis. AST >> ALT.  Severe protein calorie malnutrition  With underlying hypoalbuminemia in the setting of liver cirrhosis, alcohol abuse  PO intake improving. DVT Prophylaxis  Lovenox subQ. Platelet count is WNL.  Code Status: Full.  Family Communication: Plan of care discussed with the patient and her family at the bedside 05/23/14.  Disposition Plan: remains inpatient.    IV Access:   PICC  line placed 05/16/14. Procedures and  diagnostic studies:   Dg Chest 2 View 05/14/2014 Emphysema with small, right greater than left pleural effusions. Bibasilar airspace disease is likely atelectatic although superimposed infection at the right base would be difficult to exclude.  Ct Chest W Contrast 05/14/2014 1. Moderate layering right pleural effusion with compression of the right lung, but no pneumonia. Small layering left pleural effusion. 2. Ascites, and nodular liver suggesting cirrhosis. No liver mass. 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 Mild porta hepatis lymphadenopathy likely reactive due to chronic liver disease. 3. Suggestion of a 3-4 cm soft tissue mass of the rectum partially narrowing the lumen about 6 cm upstream of the anal verge. Recommend colonoscopy. No surrounding lymphadenopathy. No distant metastatic disease. 4. Distended bladder. 5. Cholelithiasis. 6. Chronic appearing L1 and L2 compression fractures. 7. Calcified aortic atherosclerosis in the chest and abdomen.  Ct Abdomen Pelvis W Contrast 05/14/2014 1. Moderate layering right pleural effusion with compression of the right lung, but no pneumonia. Small layering left pleural effusion. 2. Ascites, and nodular liver suggesting cirrhosis. No liver mass. 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 Mild porta hepatis lymphadenopathy likely reactive due to chronic liver disease. 3. Suggestion of a 3-4 cm soft tissue mass of the rectum partially narrowing the lumen about 6 cm upstream of the anal verge. Recommend colonoscopy. No surrounding lymphadenopathy. No distant metastatic disease. 4. Distended bladder. 5. Cholelithiasis. 6. Chronic appearing L1 and L2 compression fractures. 7. Calcified aortic atherosclerosis in the chest and abdomen.  US Paracentesis 05/16/2014 Successful ultrasound guided paracentesis yielding 140 mL of ascites.   Dg Chest Port 1 View 05/17/2014: Left arm PICC tip in the lower SVC. Persistent bilateral pleural effusions with bibasilar atelectasis.  Medical Consultants:   Nephrology (Dr. Roney Jaffe)  Gastroenterology (Dr. Scarlette Shorts) Anti-Infectives:   Levaquin 10/20 -->05/21/14   Leisa Lenz, MD  [facility] Pager [phone]  If 7PM-7AM, please contact night-coverage [email or web address] Password [email or web address] 05/23/2014, 3:42 PM   LOS: 9 days    HPI/Subjective: No acute overnight events.  Objective: Filed Vitals:   05/22/14 2048 05/23/14 0545 05/23/14 1050 05/23/14 1325  BP: 98/63 122/67  107/60  Pulse: 101 107  107  Temp: 99.5 F (37.5 C) 98 F (36.7 C)  98.7 F (37.1 C)  TempSrc: Oral Oral  Oral  Resp: _0 Height:      Weight:      SpO2: 98% 99% 95% 99%    Intake/Output Summary (Last 24 hours) at 05/23/14 1542 Last data filed at 05/23/14 0552  Gross per 24 hour  Intake    100 ml  Output      0 ml  Net    100 ml    Exam:   General:  Pt is alert, follows commands appropriately, not in acute distress  Cardiovascular: Regular rate and rhythm, S1/S2, no murmurs  Respiratory: Clear to auscultation bilaterally, no wheezing, no crackles, no rhonchi  Abdomen: Soft, non tender, non distended, bowel sounds present  Extremities: No edema, pulses DP and PT palpable bilaterally  Neuro: Grossly nonfocal  Data Reviewed: Basic Metabolic Panel:  Recent Labs Lab 05/17/14 0400  05/18/14 0550  05/19/14 0400 05/19/14 0845 05/20/14 0500 05/21/14 0529 05/22/14 0530 05/23/14 0830  NA 117*  < > 119*  < > 125* 123* 125* 127* 130* 132*  K 3.1*  < > 3.6*  --  4.2  --  4.7 4.1 4.7 4.3  CL 77*  < >  81*  --  90*  --  94* 96 99 99  CO2 32  < > 28  --  27  --  _0 GLUCOSE 97  < > 107*  --  84  --  86 97 76 88  BUN 4*  < > 4*  --  5*  --  5* 5* 5* 5*  CREATININE 0.39*  < > 0.35*  --  0.40*  --  0.37* 0.40* 0.38* 0.37*  CALCIUM 7.3*  < > 7.7*  --  7.5*  --  7.3*  7.6* 7.8* 8.1*  MG 1.8  --  1.7  --   --   --   --   --   --   --   < > = values in this interval not displayed. Liver Function Tests:  Recent Labs Lab 05/17/14 0400 05/22/14 0530  AST 84* 44*  ALT 21 15  ALKPHOS 175* 155*  BILITOT 0.8 0.6  PROT 5.1* 5.3*  ALBUMIN 1.5* 1.6*   No results found for this basename: LIPASE, AMYLASE,  in the last 168 hours No results found for this basename: AMMONIA,  in the last 168 hours CBC:  Recent Labs Lab 05/18/14 0550 05/19/14 0400 05/20/14 0500 05/22/14 0530 05/23/14 1445  WBC 12.9* 11.8* 12.5* 14.0* 13.0*  HGB 9.0* 8.4* 8.3* 8.4* 8.6*  HCT 25.4* 23.6* 23.4* 24.3* 25.2*  MCV 103.3* 102.2* 102.6* 105.2* 104.1*  PLT 261 239 242 229 250   Cardiac Enzymes: No results found for this basename: CKTOTAL, CKMB, CKMBINDEX, TROPONINI,  in the last 168 hours BNP: No components found with this basename: POCBNP,  CBG: No results found for this basename: GLUCAP,  in the last 168 hours  Recent Results (from the past 240 hour(s))  CULTURE, BLOOD (ROUTINE X 2)     Status: None   Collection Time    05/14/14  6:35 PM      Result Value Ref Range Status   Specimen Description BLOOD RIGHT ANTECUBITAL   Final   Special Requests BOTTLES DRAWN AEROBIC AND ANAEROBIC 3ML   Final   Culture  Setup Time     Final   Value: 05/15/2014 01:18     Performed at [place]     Final   Value: NO GROWTH 5 DAYS     Performed at [facility]   Report Status 05/21/2014 FINAL   Final  CULTURE, BLOOD (ROUTINE X 2)     Status: None   Collection Time    05/14/14  6:49 PM      Result Value Ref Range Status   Specimen Description BLOOD RIGHT HAND   Final   Special Requests BOTTLES DRAWN AEROBIC ONLY 3ML   Final   Culture  Setup Time     Final   Value: 05/15/2014 01:18     Performed at [place]     Final   Value: NO GROWTH 5 DAYS     Performed at [facility]   Report Status 05/21/2014 FINAL   Final  MRSA  PCR SCREENING     Status: None   Collection Time    05/14/14  8:54 PM      Result Value Ref Range Status   MRSA by PCR NEGATIVE  NEGATIVE Final   Comment:            The GeneXpert MRSA Assay (FDA     approved for NASAL  specimens     only), is one component of a     comprehensive MRSA colonization     surveillance program. It is not     intended to diagnose MRSA     infection nor to guide or     monitor treatment for     MRSA infections.  URINE CULTURE     Status: None   Collection Time    05/14/14  9:15 PM      Result Value Ref Range Status   Specimen Description URINE, CLEAN CATCH   Final   Special Requests NONE   Final   Culture  Setup Time     Final   Value: 05/15/2014 01:52     Performed at [place]     Final   Value: NO GROWTH     Performed at [facility]   Culture     Final   Value: NO GROWTH     Performed at [facility]   Report Status 05/15/2014 FINAL   Final  BODY FLUID CULTURE     Status: None   Collection Time    05/16/14  2:53 PM      Result Value Ref Range Status   Specimen Description PLEURAL   Final   Special Requests Normal   Final   Gram Stain     Final   Value: RARE WBC PRESENT, PREDOMINANTLY MONONUCLEAR     NO ORGANISMS SEEN     Performed at [facility]   Culture     Final   Value: NO GROWTH 3 DAYS     Performed at [facility]   Report Status 05/20/2014 FINAL   Final  CLOSTRIDIUM DIFFICILE BY PCR     Status: None   Collection Time    05/17/14  9:17 PM      Result Value Ref Range Status   C difficile by pcr NEGATIVE  NEGATIVE Final   Comment: Performed at [facility]     Scheduled Meds: . feeding supplement (RESOURCE BREEZE)  1 Container Oral BID BM  . folic acid  1 mg Oral Daily  . multivitamin with minerals  1 tablet Oral Daily  . peg 3350 powder  0.5 kit Oral Once   And  . peg 3350 powder  0.5 kit Oral Once  . potassium chloride  20 mEq Oral BID  . sodium chloride   2 g Oral TID WC  . thiamine  100 mg Oral Daily   Or  . thiamine  100 mg Intravenous Daily   Continuous Infusions:

## 2014-05-23 NOTE — Progress Notes (Signed)
Patient ID: FEMALE IAFRATE, female   DOB: 10-30-1945, 68 y.o.   MRN: [id number]  [facility] Progress Note  Subjective: Doing  Well, no complaints.   Objective:  Vital signs in last 24 hours: Temp:  [98 F (36.7 C)-99.5 F (37.5 C)] 98 F (36.7 C) (10/28 0545) Pulse Rate:  [101-107] 107 (10/28 0545) Resp:  [16-20] 16 (10/28 0545) BP: (95-122)/(49-67) 122/67 mmHg (10/28 0545) SpO2:  [98 %-99 %] 99 % (10/28 0545) Last BM Date: 05/22/14 General:   Alert,  Well-developed, thin WF   in NAD Heart:  Regular rate and rhythm; no murmurs Pulm;clear Abdomen:  Soft, nontender and nondistended. Normal bowel sounds, without guarding, and without rebound.   Extremities:  Without edema. Neurologic:  Alert and  oriented x4;  grossly normal neurologically. Psych:  Alert and cooperative. Normal mood and affect.  Lab Results: BMET  Recent Labs  05/21/14 0529 05/22/14 0530 05/23/14 0830  NA 127* 130* 132*  K 4.1 4.7 4.3  CL 96 99 99  CO2 24 23 24   GLUCOSE 97 76 88  BUN 5* 5* 5*  CREATININE 0.40* 0.38* 0.37*  CALCIUM 7.6* 7.8* 8.1*   LFT  Assessment / Plan: #1  68 yo female  With  Large rectal tubovillous adenoma- plan is for colonoscopy tomorrow, and removal of polyp-will prep later today . Stop Lovenox #2 hyponatremia- much improved #3 ETOH abuse/cirrhosis #4 COPD     LOS: 9 days   Amy Esterwood  05/23/2014, 10:00 AM    [facility] Attending  I have also seen and assessed the patient and agree with the above note. For colonoscopy and polypectomy tomorrow. The risks and benefits as well as alternatives of endoscopic procedure(s) have been discussed and reviewed. All questions answered. The patient agrees to proceed.  Gatha Mayer, MD, [facility] [phone] (pager) 05/23/2014 5:59 PM

## 2014-05-23 NOTE — Anesthesia Preprocedure Evaluation (Addendum)
Anesthesia Evaluation  Patient identified by MRN, date of birth, ID band Patient awake    Reviewed: Allergy & Precautions, H&P , NPO status , [name] Chart, lab work & pertinent test results  History of Anesthesia Complications Negative for: history of anesthetic complications  Airway Mallampati: III  TM Distance: >3 FB Neck ROM: Full  Mouth opening: Limited Mouth Opening  Dental  (+) Poor Dentition, Caps, Missing, Dental Advisory Given, Chipped,    Pulmonary COPD COPD inhaler, Current Smoker,    Pulmonary exam normal + decreased breath sounds      Cardiovascular Exercise Tolerance: Good + Peripheral Vascular Disease Rhythm:Regular Rate:Normal  Reports that she can do heavy yard work without chest pain or shortness of breath, denies DOE, denies need for oxygen at home    Neuro/Psych negative neurological ROS  negative psych ROS   GI/Hepatic negative GI ROS, (+) Cirrhosis -    substance abuse  alcohol use,   Endo/Other  negative endocrine ROS  Renal/GU negative Renal ROS  negative genitourinary   Musculoskeletal negative musculoskeletal ROS (+)   Abdominal   Peds negative pediatric ROS (+)  Hematology negative hematology ROS (+)   Anesthesia Other Findings   Reproductive/Obstetrics negative OB ROS                            Anesthesia Physical Anesthesia Plan  ASA: III  Anesthesia Plan: MAC   Post-op Pain Management:    Induction: Intravenous  Airway Management Planned: Nasal Cannula  Additional Equipment:   Intra-op Plan:   Post-operative Plan: Extubation in OR  Informed Consent: I have reviewed the patients History and Physical, chart, labs and discussed the procedure including the risks, benefits and alternatives for the proposed anesthesia with the patient or authorized representative who has indicated his/her understanding and acceptance.   Dental advisory  given  Plan Discussed with: CRNA  Anesthesia Plan Comments:         Anesthesia Quick Evaluation

## 2014-05-23 NOTE — Plan of Care (Signed)
Problem: Phase III Progression Outcomes Goal: Other Phase III Outcomes/Goals Outcome: Completed/Met Date Met:  05/23/14 Patient tolerates diet,no n/v noted.

## 2014-05-24 ENCOUNTER — Encounter (HOSPITAL_COMMUNITY)
Admission: EM | Disposition: A | Discharge status: Home / Self Care | Payer: Self-pay | Source: Home / Self Care | Attending: Internal Medicine

## 2014-05-24 ENCOUNTER — Encounter (HOSPITAL_COMMUNITY): Payer: Medicare Other | Admitting: Anesthesiology

## 2014-05-24 ENCOUNTER — Inpatient Hospital Stay (HOSPITAL_COMMUNITY): Payer: Medicare Other | Admitting: Anesthesiology

## 2014-05-24 ENCOUNTER — Encounter (HOSPITAL_COMMUNITY): Payer: Self-pay | Admitting: *Deleted

## 2014-05-24 DIAGNOSIS — K6389 Other specified diseases of intestine: Secondary | ICD-10-CM

## 2014-05-24 HISTORY — PX: COLONOSCOPY: SHX5424

## 2014-05-24 LAB — BASIC METABOLIC PANEL
Anion gap: 8 (ref 5–15)
BUN: 5 mg/dL — ABNORMAL LOW (ref 6–23)
CO2: 24 mEq/L (ref 19–32)
Calcium: 8.1 mg/dL — ABNORMAL LOW (ref 8.4–10.5)
Chloride: 104 mEq/L (ref 96–112)
Creatinine, Ser: 0.37 mg/dL — ABNORMAL LOW (ref 0.50–1.10)
GFR calc Af Amer: >90 mL/min (ref >90)
GFR calc non Af Amer: >90 mL/min (ref >90)
GLUCOSE: 87 mg/dL (ref 70–99)
Potassium: 4.2 mEq/L (ref 3.7–5.3)
SODIUM: 136 meq/L — AB (ref 137–147)

## 2014-05-24 SURGERY — COLONOSCOPY
Anesthesia: Monitor Anesthesia Care

## 2014-05-24 MED ORDER — PROPOFOL 10 MG/ML IV BOLUS
INTRAVENOUS | Status: AC
  Filled 2014-05-24: qty 20

## 2014-05-24 MED ORDER — PROPOFOL 10 MG/ML IV BOLUS
INTRAVENOUS | Status: DC | PRN
  Administered 2014-05-24: 50 mg via INTRAVENOUS
  Administered 2014-05-24: 20 mg via INTRAVENOUS
  Administered 2014-05-24: 100 mg via INTRAVENOUS
  Administered 2014-05-24 (×2): 50 mg via INTRAVENOUS
  Administered 2014-05-24: 10 mg via INTRAVENOUS
  Administered 2014-05-24 (×2): 50 mg via INTRAVENOUS
  Administered 2014-05-24: 20 mg via INTRAVENOUS
  Administered 2014-05-24 (×3): 50 mg via INTRAVENOUS
  Administered 2014-05-24 (×3): 20 mg via INTRAVENOUS
  Administered 2014-05-24: 50 mg via INTRAVENOUS
  Administered 2014-05-24 (×2): 20 mg via INTRAVENOUS
  Administered 2014-05-24: 50 mg via INTRAVENOUS

## 2014-05-24 MED ORDER — EPINEPHRINE HCL 0.1 MG/ML IJ SOSY
PREFILLED_SYRINGE | INTRAMUSCULAR | Status: AC
  Filled 2014-05-24: qty 10

## 2014-05-24 MED ORDER — SODIUM CHLORIDE 0.9 % IJ SOLN
PREFILLED_SYRINGE | INTRAMUSCULAR | Status: DC | PRN
  Administered 2014-05-24: 12:00:00

## 2014-05-24 MED ORDER — LACTATED RINGERS IV SOLN
INTRAVENOUS | Status: DC | PRN
  Administered 2014-05-24: 10:00:00 via INTRAVENOUS

## 2014-05-24 NOTE — Progress Notes (Signed)
PT Cancellation Note  Patient Details Name: Cassidy Barrett MRN: [id number] DOB: [birth year]   Cancelled Treatment:    Reason Eval/Treat Not Completed: Patient at procedure or test/unavailable (endoscopy) Will check back as schedule permits.   Shakyla Nolley,KATHrine E 05/24/2014, 10:20 AM Carmelia Bake, PT, DPT 05/24/2014 Pager: [phone]

## 2014-05-24 NOTE — Transfer of Care (Signed)
Immediate Anesthesia Transfer of Care Note  Patient: Cassidy Barrett  Procedure(s) Performed: Procedure(s) (LRB): COLONOSCOPY (N/A)  Patient Location: PACU  Anesthesia Type: MAC  Level of Consciousness: sedated, patient cooperative and responds to stimulation  Airway & Oxygen Therapy: Patient Spontanous Breathing and Patient connected to face mask oxgen  Post-op Assessment: Report given to PACU RN and Post -op Vital signs reviewed and stable  Post vital signs: Reviewed and stable  Complications: No apparent anesthesia complications

## 2014-05-24 NOTE — Progress Notes (Signed)
Patient ID: Cassidy Barrett, female   DOB: 19-Jul-1946, 68 y.o.   MRN: [id number] [facility] PROGRESS NOTE  Cassidy Barrett ZYS:[id number] DOB: 01/16/1946 DOA: 05/14/2014 PCP: Imelda Pillow, NP  Brief narrative: 68 y.o. female with a PMH of ongoing tobacco abuse, EtOH abuse who presented to [facility] ED 05/14/2014 with what initially started as cold / flu like symptoms 3 weeks prior to admission, decreased appetite, weakness. She was seen in primary care office and was given azithromycina and prednisone taper and CXR done 05/09/2014 apparently showed ride sided pleural effusion, questionable mass. She continued to experience weakness, cough (which is chronic but worse than usual). No fevers, chills. On admission, vitals were stable. CXR showed emphysema with small right greater than left pleural effusion, bibasilar airspace disease with possible superimposed infection at the right base. Patient received IV Rocephin in ED and we started Levaquin for treatment of possible pneumonia. Further imaging study included CT chest and abdomen which revealed moderate layering right pleural effusion with compression of the right lung, no pneumonia; ascites and possible liver cirrhosis, no liver mass, suggestion of a 3-4 cm soft tissue mass of the rectum partially narrowing the lumen, recommending colonoscopy. Blood work was significant for leukocytosis of 21.7 and sodium of 111, potassium 2.5, chloride 65. Hospital course is complicated by ongoing hyponatremia and need for 3% NaCl infusion. Renal is following. Status post sigmoidoscopy 05/21/2014, biopsy results consistent with tubovillous adenoma. Plan for colonoscopy today to remove the polyp.  Assessment/Plan:   Principal Problem:  Acute respiratory failure with hypoxia / Community acquired pneumonia / COPD exacerbation / right pleural effusion / leukocytosis  Respiratory status remains stable. Patient completed 7 days of Levaquin. S/P course of prednisone as an  outpatient.  Moderate layering pleural effusion on the right seen on CT chest, persistent on F/U CXR 05/17/2014. No need for thoracentesis at this time. Good O2 sats and no respiratory distress.  Blood cultures so far show no growth.   Active Problems:  Alcohol abuse / possible acute alcohol intoxication  Continue multivitamin, folic acid and thiamine. No withdrawals. Abdominal ascites in the setting of liver cirrhosis  Patient is status post paracentesis 05/16/2014 with 140 cc fluid drained and sent for analysis. Culture negative to date. Cytology showed reactive mesothelial cells. Hyponatremia (hypervolemic hyponatremia)  Secondary to cirrhosis physiology. Currently on salt tablets after being treated with 3% saline.  Continue fluid restriction.  Nephrologist following with recommendations to D/C salt tabs if/when sodium > 135. This am sodium is 136 so will stop sodium tables. Soft tissue rectal mass  Noted on CT scan. Status post sigmoidoscopy on 05/21/2014, biopsy results consistent with tubovillous adenoma. Plan for colonoscopy today to remove the polyp. Hypokalemia/hypomagnesemia  Likely from history of alcohol abuse, electrolyte depletion.  Potassium WNL. Acute on chronic blood loss anemia / anemia of chronic disease  Hemoglobin steadily dropping since admission values. Anemia also from chronic disease, liver cirrhosis.  Hemoglobin 8.6.  No current indications for transfusion.  Transaminitis  From alcohol use, alcoholic hepatitis. AST >> ALT.  Severe protein calorie malnutrition  With underlying hypoalbuminemia in the setting of liver cirrhosis, alcohol abuse  PO intake improving. DVT Prophylaxis  Lovenox subQ. Platelet count is WNL.  Code Status: Full.  Family Communication: Plan of care discussed with the patient and her family at the bedside 05/23/14.  Disposition Plan: home in next 24 hours.  IV Access:   PICC line placed 05/16/14. Procedures and diagnostic studies:    Dg Chest  2 View 05/14/2014 Emphysema with small, right greater than left pleural effusions. Bibasilar airspace disease is likely atelectatic although superimposed infection at the right base would be difficult to exclude.  Ct Chest W Contrast 05/14/2014 1. Moderate layering right pleural effusion with compression of the right lung, but no pneumonia. Small layering left pleural effusion. 2. Ascites, and nodular liver suggesting cirrhosis. No liver mass. 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 Mild porta hepatis lymphadenopathy likely reactive due to chronic liver disease. 3. Suggestion of a 3-4 cm soft tissue mass of the rectum partially narrowing the lumen about 6 cm upstream of the anal verge. Recommend colonoscopy. No surrounding lymphadenopathy. No distant metastatic disease. 4. Distended bladder. 5. Cholelithiasis. 6. Chronic appearing L1 and L2 compression fractures. 7. Calcified aortic atherosclerosis in the chest and abdomen.  Ct Abdomen Pelvis W Contrast 05/14/2014 1. Moderate layering right pleural effusion with compression of the right lung, but no pneumonia. Small layering left pleural effusion. 2. Ascites, and nodular liver suggesting cirrhosis. No liver mass. 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 Mild porta hepatis lymphadenopathy likely reactive due to chronic liver disease. 3. Suggestion of a 3-4 cm soft tissue mass of the rectum partially narrowing the lumen about 6 cm upstream of the anal verge. Recommend colonoscopy. No surrounding lymphadenopathy. No distant metastatic disease. 4. Distended bladder. 5. Cholelithiasis. 6. Chronic appearing L1 and L2 compression fractures. 7. Calcified aortic atherosclerosis in the chest and abdomen.  US Paracentesis 05/16/2014 Successful ultrasound guided paracentesis yielding 140 mL of ascites.  Dg Chest Port 1 View 05/17/2014: Left arm PICC tip in the  lower SVC. Persistent bilateral pleural effusions with bibasilar atelectasis.  Medical Consultants:   Nephrology (Dr. Roney Jaffe)  Gastroenterology (Dr. Scarlette Shorts) Anti-Infectives:   Levaquin 10/20 -->05/21/14   Leisa Lenz, MD  [facility] Pager [phone]  If 7PM-7AM, please contact night-coverage [email or web address] Password [email or web address] 05/24/2014, 5:18 PM   LOS: 10 days    HPI/Subjective: No acute overnight events.  Objective: Filed Vitals:   05/24/14 1140 05/24/14 1141 05/24/14 1145 05/24/14 1150  BP: 105/83     Pulse:    91  Temp:      TempSrc:      Resp: 26 21 24 19   Height:      Weight:      SpO2:    94%    Intake/Output Summary (Last 24 hours) at 05/24/14 1718 Last data filed at 05/24/14 1129  Gross per 24 hour  Intake    920 ml  Output      0 ml  Net    920 ml    Exam:   General:  Pt is alert, follows commands appropriately, not in acute distress  Cardiovascular: Regular rate and rhythm, S1/S2, no murmurs  Respiratory: Clear to auscultation bilaterally, no wheezing   Data Reviewed: Basic Metabolic Panel:  Recent Labs Lab 05/18/14 0550  05/20/14 0500 05/21/14 0529 05/22/14 0530 05/23/14 0830 05/24/14 0500  NA 119*  < > 125* 127* 130* 132* 136*  K 3.6*  < > 4.7 4.1 4.7 4.3 4.2  CL 81*  < > 94* 96 99 99 104  CO2 28  < > 26 24 23 24 24   GLUCOSE 107*  < > 86 97 76 88 87  BUN 4*  < > 5* 5* 5* 5* 5*  CREATININE 0.35*  < > 0.37* 0.40* 0.38* 0.37* 0.37*  CALCIUM 7.7*  < > 7.3*  7.6* 7.8* 8.1* 8.1*  MG 1.7  --   --   --   --   --   --   < > = values in this interval not displayed. Liver Function Tests:  Recent Labs Lab 05/22/14 0530  AST 44*  ALT 15  ALKPHOS 155*  BILITOT 0.6  PROT 5.3*  ALBUMIN 1.6*   No results found for this basename: LIPASE, AMYLASE,  in the last 168 hours No results found for this basename: AMMONIA,  in the last 168 hours CBC:  Recent Labs Lab 05/18/14 0550 05/19/14 0400 05/20/14 0500 05/22/14 0530  05/23/14 1445  WBC 12.9* 11.8* 12.5* 14.0* 13.0*  HGB 9.0* 8.4* 8.3* 8.4* 8.6*  HCT 25.4* 23.6* 23.4* 24.3* 25.2*  MCV 103.3* 102.2* 102.6* 105.2* 104.1*  PLT 261 239 242 229 250   Cardiac Enzymes: No results found for this basename: CKTOTAL, CKMB, CKMBINDEX, TROPONINI,  in the last 168 hours BNP: No components found with this basename: POCBNP,  CBG: No results found for this basename: GLUCAP,  in the last 168 hours  Recent Results (from the past 240 hour(s))  CULTURE, BLOOD (ROUTINE X 2)     Status: None   Collection Time    05/14/14  6:35 PM      Result Value Ref Range Status   Specimen Description BLOOD RIGHT ANTECUBITAL   Final   Special Requests BOTTLES DRAWN AEROBIC AND ANAEROBIC 3ML   Final   Culture  Setup Time     Final   Value: 05/15/2014 01:18     Performed at [facility]   Culture     Final   Value: NO GROWTH 5 DAYS     Performed at [facility]   Report Status 05/21/2014 FINAL   Final  CULTURE, BLOOD (ROUTINE X 2)     Status: None   Collection Time    05/14/14  6:49 PM      Result Value Ref Range Status   Specimen Description BLOOD RIGHT HAND   Final   Special Requests BOTTLES DRAWN AEROBIC ONLY 3ML   Final   Culture  Setup Time     Final   Value: 05/15/2014 01:18     Performed at [facility]   Culture     Final   Value: NO GROWTH 5 DAYS     Performed at [facility]   Report Status 05/21/2014 FINAL   Final  MRSA PCR SCREENING     Status: None   Collection Time    05/14/14  8:54 PM      Result Value Ref Range Status   MRSA by PCR NEGATIVE  NEGATIVE Final   Comment:            The GeneXpert MRSA Assay (FDA     approved for NASAL specimens     only), is one component of a     comprehensive MRSA colonization     surveillance program. It is not     intended to diagnose MRSA     infection nor to guide or     monitor treatment for     MRSA infections.  URINE CULTURE     Status: None   Collection Time    05/14/14  9:15  PM      Result Value Ref Range Status   Specimen Description URINE, CLEAN CATCH   Final   Special Requests NONE   Final   Culture  Setup Time  Final   Value: 05/15/2014 01:52     Performed at [place]     Final   Value: NO GROWTH     Performed at [facility]   Culture     Final   Value: NO GROWTH     Performed at [facility]   Report Status 05/15/2014 FINAL   Final  BODY FLUID CULTURE     Status: None   Collection Time    05/16/14  2:53 PM      Result Value Ref Range Status   Specimen Description PLEURAL   Final   Special Requests Normal   Final   Gram Stain     Final   Value: RARE WBC PRESENT, PREDOMINANTLY MONONUCLEAR     NO ORGANISMS SEEN     Performed at [facility]   Culture     Final   Value: NO GROWTH 3 DAYS     Performed at [facility]   Report Status 05/20/2014 FINAL   Final  CLOSTRIDIUM DIFFICILE BY PCR     Status: None   Collection Time    05/17/14  9:17 PM      Result Value Ref Range Status   C difficile by pcr NEGATIVE  NEGATIVE Final   Comment: Performed at [facility]     Scheduled Meds: . feeding supplement (RESOURCE BREEZE)  1 Container Oral BID BM  . folic acid  1 mg Oral Daily  . multivitamin with minerals  1 tablet Oral Daily  . potassium chloride  20 mEq Oral BID  . sodium chloride  2 g Oral TID WC  . thiamine  100 mg Oral Daily   Continuous Infusions:

## 2014-05-24 NOTE — Anesthesia Postprocedure Evaluation (Signed)
  Anesthesia Post-op Note  Patient: Cassidy Barrett  Procedure(s) Performed: Procedure(s) (LRB): COLONOSCOPY (N/A)  Patient Location: PACU  Anesthesia Type: MAC  Level of Consciousness: awake and alert   Airway and Oxygen Therapy: Patient Spontanous Breathing  Post-op Pain: mild  Post-op Assessment: Post-op Vital signs reviewed, [name] Cardiovascular Status Stable, Respiratory Function Stable, Patent Airway and No signs of Nausea or vomiting  Last Vitals:  Filed Vitals:   05/24/14 1150  BP:   Pulse: 91  Temp:   Resp: 19    Post-op Vital Signs: stable   Complications: No apparent anesthesia complications

## 2014-05-24 NOTE — Op Note (Signed)
[facility] [place], [id number]   COLONOSCOPY PROCEDURE REPORT  PATIENT: Cassidy, Barrett  MR#: [id number] BIRTHDATE: Jul 24, 1946 , 86  yrs. old GENDER: female ENDOSCOPIST: Gatha Mayer, MD, [facility] PROCEDURE DATE:  05/24/2014 PROCEDURE:   Colonoscopy with snare polypectomy, Submucosal injection, any substance, and Colonoscopy with ablation First Screening Colonoscopy - Avg.  risk and is 50 yrs.  old or older - No.  Prior Negative Screening - Now for repeat screening. N/A  History of Adenoma - Now for follow-up colonoscopy & has been > or = to 3 yrs.  N/A  Polyps Removed Today? Yes. ASA CLASS:   Class III INDICATIONS:therapy of advanced neoplasm of colon.   Large rectal polyp MEDICATIONS: Per Anesthesia and Monitored anesthesia care  DESCRIPTION OF PROCEDURE:   After the risks benefits and alternatives of the procedure were thoroughly explained, informed consent was obtained.  The digital rectal exam revealed no abnormalities of the rectum.   The Pentax Slim Colonicsope [id number])  endoscope was introduced through the anus and advanced to the cecum, which was identified by both the appendix and ileocecal valve. No adverse events experienced.   The quality of the prep was good, using MoviPrep  The instrument was then slowly withdrawn as the colon was fully examined.      COLON FINDINGS: A polypoid shaped pedunculated polyp measuring 45 mm in size was found in the rectum. Epinephrine 1:10K x 4 cc injected in to stalk and polyp prior to snaring to reduce bleeding risk. A polypectomy was performed in a piecemeal fashion using snare cautery.  The resection was complete, the polyp tissue was completely retrieved and sent to histology.  Destruction of tissue via ablation was performed at polyp edge.   There was severe diverticulosis noted in the sigmoid colon with associated luminal narrowing and angulation.   The examination was otherwise  normal. Right colon retroflexion included.  Retroflexion was not performed. It was done at flex sig 3 d ago. The time to cecum=11 minutes 0 seconds.  Withdrawal time=65 minutes 0 seconds.  The scope was withdrawn and the procedure completed. COMPLICATIONS: There were no immediate complications.  ENDOSCOPIC IMPRESSION: 1.  Very large 45 mm pedunculated polyp was found in the rectum; polypectomy was performed in a piecemeal fashion using snare cautery; Destruction of tissue via ablation was attempted 2.   There was severe diverticulosis noted in the sigmoid colon 3.   The examination was otherwise normal - ultraslim colonoscopy necessary to navigate sigmoid 4.   Right colon retroflexion included  RECOMMENDATIONS: 1.  Hold Aspirin and all other NSAIDS for 2 weeks. 2.  Will contact patient re: results and f/u OK to dc home today - no driving today after sedation  eSigned:  Gatha Mayer, MD, [facility] 05/24/2014 11:41 AM   cc: The Patient, Scarlette Shorts, MD and Barbee Shropshire, NP   PATIENT NAME:  Cassidy, Barrett MR#: [id number]

## 2014-05-25 ENCOUNTER — Encounter (HOSPITAL_COMMUNITY): Payer: Self-pay | Admitting: Internal Medicine

## 2014-05-25 LAB — BASIC METABOLIC PANEL
Anion gap: 8 (ref 5–15)
BUN: 4 mg/dL — ABNORMAL LOW (ref 6–23)
CALCIUM: 7.1 mg/dL — AB (ref 8.4–10.5)
CO2: 22 mEq/L (ref 19–32)
CREATININE: 0.43 mg/dL — AB (ref 0.50–1.10)
Chloride: 107 mEq/L (ref 96–112)
GFR calc non Af Amer: >90 mL/min (ref >90)
Glucose, Bld: 74 mg/dL (ref 70–99)
POTASSIUM: 3.9 meq/L (ref 3.7–5.3)
Sodium: 137 mEq/L (ref 137–147)

## 2014-05-25 MED ORDER — BOOST / RESOURCE BREEZE PO LIQD: 1.0000 | Freq: Two times a day (BID) | ORAL | Status: DC

## 2014-05-25 MED ORDER — POTASSIUM CHLORIDE CRYS ER 20 MEQ PO TBCR: 20.0000 meq | EXTENDED_RELEASE_TABLET | Freq: Every day | ORAL | Status: DC

## 2014-05-25 MED ORDER — ALBUTEROL SULFATE HFA 108 (90 BASE) MCG/ACT IN AERS: 2.0000 | INHALATION_SPRAY | RESPIRATORY_TRACT | Status: DC | PRN

## 2014-05-25 MED ORDER — GUAIFENESIN-DM 100-10 MG/5ML PO SYRP: 5.0000 mL | ORAL_SOLUTION | ORAL | Status: DC | PRN

## 2014-05-25 MED ORDER — FOLIC ACID 1 MG PO TABS: 1.0000 mg | ORAL_TABLET | Freq: Every day | ORAL | Status: DC

## 2014-05-25 MED ORDER — OXYCODONE HCL 5 MG PO TABS: 5.0000 mg | ORAL_TABLET | ORAL | Status: DC | PRN

## 2014-05-25 MED ORDER — THIAMINE HCL 100 MG PO TABS: 100.0000 mg | ORAL_TABLET | Freq: Every day | ORAL | Status: DC

## 2014-05-25 MED ORDER — FUROSEMIDE 20 MG PO TABS: 20.0000 mg | ORAL_TABLET | Freq: Every day | ORAL | Status: DC

## 2014-05-25 MED ORDER — ONDANSETRON HCL 4 MG PO TABS: 4.0000 mg | ORAL_TABLET | Freq: Four times a day (QID) | ORAL | Status: DC | PRN

## 2014-05-25 MED ORDER — ADULT MULTIVITAMIN W/MINERALS CH: 1.0000 | ORAL_TABLET | Freq: Every day | ORAL | Status: DC

## 2014-05-25 MED ORDER — LIP MEDEX EX OINT
TOPICAL_OINTMENT | CUTANEOUS | Status: AC
  Filled 2014-05-25: qty 7

## 2014-05-25 NOTE — Progress Notes (Signed)
PT Cancellation Note  Patient Details Name: JALEIGHA DEANE MRN: [id number] DOB: 01/09/1946   Cancelled Treatment:    Reason Eval/Treat Not Completed: Patient declined, no reason specified (pt stated she feels "loopy" from the anesthesia yesterday and doesn't want to walk right now. Will follow. )   Philomena Doheny 05/25/2014, 9:53 AM [phone]

## 2014-05-25 NOTE — Discharge Summary (Signed)
Physician Discharge Summary  Cassidy Barrett ACZ:[id number] DOB: Jun 22, 1946 DOA: 05/14/2014  PCP: Imelda Pillow, NP  Admit date: 05/14/2014 Discharge date: 05/25/2014  Recommendations for Outpatient Follow-up:  1. Take Lasix 20 mg once a day for 5 days. Follow-up with [facility] for scheduled  Appointment. 2. Your sodium was within normal limits at the time of the discharge. No need for sodium tablets per nephrology recommendations.  Discharge Diagnoses:  Principal Problem:   Hyponatremia Active Problems:   Edema   Tobacco abuse   Alcohol abuse   Hypokalemia   COPD (chronic obstructive pulmonary disease)   Protein-calorie malnutrition, severe   Rectal mass   Alcoholic cirrhosis   Rectal polyp    Discharge Condition: stable   Diet recommendation: as tolerated   History of present illness:  68 y.o. female with a PMH of ongoing tobacco abuse, EtOH abuse who presented to [facility] ED 05/14/2014 with what initially started as cold / flu like symptoms 3 weeks prior to admission, decreased appetite, weakness. She was seen in primary care office and was given azithromycina and prednisone taper and CXR done 05/09/2014 apparently showed ride sided pleural effusion, questionable mass. She continued to experience weakness, cough (which is chronic but worse than usual). No fevers, chills. On admission, vitals were stable. CXR showed emphysema with small right greater than left pleural effusion, bibasilar airspace disease with possible superimposed infection at the right base. Patient received IV Rocephin in ED and we started Levaquin for treatment of possible pneumonia. Further imaging study included CT chest and abdomen which revealed moderate layering right pleural effusion with compression of the right lung, no pneumonia; ascites and possible liver cirrhosis, no liver mass, suggestion of a 3-4 cm soft tissue mass of the rectum partially narrowing the lumen, recommending colonoscopy.  Blood work was significant for leukocytosis of 21.7 and sodium of 111, potassium 2.5, chloride 65. Hospital course is complicated by ongoing hyponatremia and need for 3% NaCl infusion. Renal is following. Status post sigmoidoscopy 05/21/2014, biopsy results consistent with tubovillous adenoma. Plan for colonoscopy today to remove the polyp.  Assessment/Plan:   Principal Problem:  Acute respiratory failure with hypoxia / Community acquired pneumonia / COPD exacerbation / right pleural effusion / leukocytosis   Respiratory status remains stable. Patient completed 7 days of Levaquin. S/P course of prednisone as an outpatient.   Moderate layering pleural effusion on the right seen on CT chest, persistent on F/U CXR 05/17/2014. No need for thoracentesis at this time. Good O2 sats and no respiratory distress.   Blood cultures so far show no growth.  Active Problems:  Alcohol abuse / possible acute alcohol intoxication   Continue multivitamin, folic acid and thiamine. No withdrawals. Abdominal ascites in the setting of liver cirrhosis   Patient is status post paracentesis 05/16/2014 with 140 cc fluid drained and sent for analysis. Culture negative to date. Cytology showed reactive mesothelial cells. Hyponatremia (hypervolemic hyponatremia)   Secondary to cirrhosis physiology.Received salt tablets after being treated with 3% saline.   Nephrologist following with recommendations to D/C salt tabs if/when sodium > 135. This am sodium is 136 so stopped sodium tables. Soft tissue rectal mass   Noted on CT scan. Status post sigmoidoscopy on 05/21/2014, biopsy results consistent with tubovillous adenoma. Had follow up colonoscopy to remove polyp, 05/24/2014. Hypokalemia/hypomagnesemia   Likely from history of alcohol abuse, electrolyte depletion.   Potassium WNL. Acute on chronic blood loss anemia / anemia of chronic disease   Hemoglobin steadily dropping since  admission values. Anemia  also from chronic disease, liver cirrhosis.   Hemoglobin 8.6.   No current indications for transfusion. Transaminitis   From alcohol use, alcoholic hepatitis. AST >> ALT. Severe protein calorie malnutrition   With underlying hypoalbuminemia in the setting of liver cirrhosis, alcohol abuse   PO intake improving. DVT Prophylaxis   Lovenox subQ. Platelet count is WNL.  Code Status: Full.  Family Communication: Plan of care discussed with the patient and her family at the bedside 05/23/14.    IV Access:    PICC line placed 05/16/14. Procedures and diagnostic studies:   Dg Chest 2 View 05/14/2014 Emphysema with small, right greater than left pleural effusions. Bibasilar airspace disease is likely atelectatic although superimposed infection at the right base would be difficult to exclude.  Ct Chest W Contrast 05/14/2014 1. Moderate layering right pleural effusion with compression of the right lung, but no pneumonia. Small layering left pleural effusion. 2. Ascites, and nodular liver suggesting cirrhosis. No liver mass. 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 Mild porta hepatis lymphadenopathy likely reactive due to chronic liver disease. 3. Suggestion of a 3-4 cm soft tissue mass of the rectum partially narrowing the lumen about 6 cm upstream of the anal verge. Recommend colonoscopy. No surrounding lymphadenopathy. No distant metastatic disease. 4. Distended bladder. 5. Cholelithiasis. 6. Chronic appearing L1 and L2 compression fractures. 7. Calcified aortic atherosclerosis in the chest and abdomen.  Ct Abdomen Pelvis W Contrast 05/14/2014 1. Moderate layering right pleural effusion with compression of the right lung, but no pneumonia. Small layering left pleural effusion. 2. Ascites, and nodular liver suggesting cirrhosis. No liver mass. 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  Mild porta hepatis lymphadenopathy likely reactive due to chronic liver disease. 3. Suggestion of a 3-4 cm soft tissue mass of the rectum partially narrowing the lumen about 6 cm upstream of the anal verge. Recommend colonoscopy. No surrounding lymphadenopathy. No distant metastatic disease. 4. Distended bladder. 5. Cholelithiasis. 6. Chronic appearing L1 and L2 compression fractures. 7. Calcified aortic atherosclerosis in the chest and abdomen.  US Paracentesis 05/16/2014 Successful ultrasound guided paracentesis yielding 140 mL of ascites.  Dg Chest Port 1 View 05/17/2014: Left arm PICC tip in the lower SVC. Persistent bilateral pleural effusions with bibasilar atelectasis.  Medical Consultants:    Nephrology (Dr. Roney Jaffe)   Gastroenterology (Dr. Scarlette Shorts) Anti-Infectives:    Levaquin 10/20 -->05/21/14  Signed:  Leisa Lenz, MD  [facility] 05/25/2014, 10:28 AM  Pager #: [phone]   Discharge Exam: Filed Vitals:   05/25/14 0600  BP: 101/70  Pulse: 104  Temp: 98.9 F (37.2 C)  Resp: 18   Filed Vitals:   05/24/14 1150 05/24/14 1500 05/24/14 2038 05/25/14 0600  BP:  104/62 91/65 101/70  Pulse: 91 110 110 104  Temp:  99.2 F (37.3 C) 98.7 F (37.1 C) 98.9 F (37.2 C)  TempSrc:  Oral Oral Oral  Resp: 19 20 16 18   Height:      Weight:      SpO2: 94% 97% 98% 99%    General: Pt is alert, follows commands appropriately, not in acute distress Cardiovascular: Regular rate and rhythm, S1/S2 +, no murmurs Respiratory: Clear to auscultation bilaterally, no wheezing, no crackles, no rhonchi Abdominal: Soft, non tender, non distended, bowel sounds +, no guarding Extremities: +2 LE edema, no cyanosis, pulses palpable bilaterally DP and PT Neuro: Grossly nonfocal  Discharge Instructions  Discharge  Instructions   Call MD for:  difficulty breathing, headache or visual disturbances    Complete by:  As directed      Call MD for:  persistant dizziness or  light-headedness    Complete by:  As directed      Call MD for:  persistant nausea and vomiting    Complete by:  As directed      Call MD for:  severe uncontrolled pain    Complete by:  As directed      Diet - low sodium heart healthy    Complete by:  As directed      Discharge instructions    Complete by:  As directed   1. Take Lasix 20 mg once a day for 5 days. Follow-up with [facility] for scheduled  Appointment. 2. Your sodium was within normal limits at the time of the discharge. No need for sodium tablets per nephrology recommendations.     Increase activity slowly    Complete by:  As directed             Medication List    STOP taking these medications       ibuprofen 200 MG tablet  Commonly known as:  ADVIL,MOTRIN     predniSONE 20 MG tablet  Commonly known as:  DELTASONE      TAKE these medications       albuterol 108 (90 BASE) MCG/ACT inhaler  Commonly known as:  PROVENTIL HFA;VENTOLIN HFA  Inhale 2 puffs into the lungs every 4 (four) hours as needed for wheezing or shortness of breath.     feeding supplement (RESOURCE BREEZE) Liqd  Take 1 Container by mouth 2 (two) times daily between meals.     folic acid 1 MG tablet  Commonly known as:  FOLVITE  Take 1 tablet (1 mg total) by mouth daily.     furosemide 20 MG tablet  Commonly known as:  LASIX  Take 1 tablet (20 mg total) by mouth daily.     guaiFENesin-dextromethorphan 100-10 MG/5ML syrup  Commonly known as:  ROBITUSSIN DM  Take 5 mLs by mouth every 4 (four) hours as needed for cough.     multivitamin with minerals Tabs tablet  Take 1 tablet by mouth daily.     ondansetron 4 MG tablet  Commonly known as:  ZOFRAN  Take 1 tablet (4 mg total) by mouth every 6 (six) hours as needed for nausea.     oxyCODONE 5 MG immediate release tablet  Commonly known as:  Oxy IR/ROXICODONE  Take 1 tablet (5 mg total) by mouth every 4 (four) hours as needed for moderate pain.     potassium chloride SA  20 MEQ tablet  Commonly known as:  K-DUR,KLOR-CON  Take 1 tablet (20 mEq total) by mouth daily.     thiamine 100 MG tablet  Take 1 tablet (100 mg total) by mouth daily.     Vitamin D (Ergocalciferol) 50000 UNITS Caps capsule  Commonly known as:  DRISDOL  Take 50,000 Units by mouth every 7 (seven) days. Takes on wednesdays           Follow-up Information   Follow up with [facility] On 06/05/2014. (Follow up appt after recent hospitalization; at 2:15 pm )    Contact information:   [place] [address] [phone]       Follow up with Silvano Rusk, MD On 05/31/2014. (at 8:30 am for follow up after colonoscopy )  Specialty:  Gastroenterology   Contact information:   [address] [place] [address] [phone]       Follow up with [facility] On 06/15/2014. (phone number [phone];  at 9:45 am)        The results of significant diagnostics from this hospitalization (including imaging, microbiology, ancillary and laboratory) are listed below for reference.    Significant Diagnostic Studies: Dg Chest 2 View  05/14/2014   CLINICAL DATA:  Initial encounter for pneumonia and productive cough.  EXAM: CHEST  2 VIEW  COMPARISON:  No comparison studies available.  FINDINGS: Two views study shows hyperexpansion suggesting emphysema. There is a small right pleural effusion with associated tiny left pleural effusion. Minimal dependent atelectasis is noted bilaterally and airspace infection cannot be excluded at the right base. The cardiopericardial silhouette is within normal limits for size. Bones are diffusely demineralized.  IMPRESSION: Emphysema with small, right greater than left pleural effusions.  Bibasilar airspace disease is likely atelectatic although superimposed infection at the right base would be difficult to exclude.   Electronically Signed   By: Misty Stanley M.D.   On: 05/14/2014 18:08   Ct Chest W Contrast  05/14/2014   CLINICAL DATA:   68 year old female with productive cough, shortness of breath, nausea, diarrhea. Abnormal LFTs. Initial encounter.  EXAM: CT CHEST, ABDOMEN, AND PELVIS WITH CONTRAST  TECHNIQUE: Multidetector CT imaging of the chest, abdomen and pelvis was performed following the standard protocol during bolus administration of intravenous contrast.  CONTRAST:  153mL OMNIPAQUE IOHEXOL 300 MG/ML  SOLN  COMPARISON:  Chest radiographs 1752 hr today.  FINDINGS: CT CHEST FINDINGS  Moderate size layering right pleural effusion. Associated compressive atelectasis at the right lung base, affecting both the lower and middle lobes. No superimposed pulmonary consolidation. Small layering left pleural effusion with mild left lung compressive atelectasis. Major airways are patent. There is underlying centrilobular emphysema most apparent in the upper lobes.  No pericardial effusion. No mediastinal or hilar lymphadenopathy. Negative thoracic inlet. Major mediastinal vascular structures are normal except for atherosclerosis. No axillary lymphadenopathy.  Osteopenia.   No acute osseous abnormality identified.  CT ABDOMEN AND PELVIS FINDINGS  Chronic appearing moderate L1 and mild L2 compression fractures. Mild retrolisthesis at those levels. Other lumbar levels intact. No acute osseous abnormality identified.  Moderate volume of ascites. Superimposed distended bladder. Negative uterus and adnexa.  Rounded 38 mm diameter intermediate density soft tissue in the rectum about 6 cm proximal to the anal verge (series 6, image 104 and sagittal image 66). This appears E centric to the lumen. Upstream proximal rectum is decompressed. No mesenteric lymphadenopathy identified.  Decompressed sigmoid colon with diverticulosis. Decompressed left colon. Redundant splenic flexure and transverse colon containing gas. Negative right colon and appendix. Distal small bowel within normal limits. No dilated small bowel. Decompressed stomach and duodenum.  Nodular liver  contour (anterior right lobe series 6, image 57). Fluid in the gallbladder wall with Hyperenhancing gallbladder wall with dependent cholelithiasis (series 6, image 71), but the gallbladder is not distended. Occasional small calcified granulomas in the liver, no other discrete liver lesion. Portal venous system is patent. Negative spleen.  Pancreas, adrenal glands, and kidneys are within normal limits.  Major arterial structures in the abdomen and pelvis are patent with moderate Aortoiliac calcified atherosclerosis noted.  Porta hepatis lymph nodes up to 11 mm short axis. Small ventral mesenteric varices. Small distal paraesophageal varices. No other lymphadenopathy.  IMPRESSION: 1. Moderate layering right pleural effusion with compression of  the right lung, but no pneumonia. Small layering left pleural effusion. 2. Ascites, and nodular liver suggesting cirrhosis. No liver mass. 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 Mild porta hepatis lymphadenopathy likely reactive due to chronic liver disease. 3. Suggestion of a 3-4 cm soft tissue mass of the rectum partially narrowing the lumen about 6 cm upstream of the anal verge. Recommend colonoscopy. No surrounding lymphadenopathy. No distant metastatic disease. 4. Distended bladder. 5. Cholelithiasis. 6. Chronic appearing L1 and L2 compression fractures. 7. Calcified aortic atherosclerosis in the chest and abdomen.   Electronically Signed   By: Lars Pinks M.D.   On: 05/14/2014 20:32   Ct Abdomen Pelvis W Contrast  05/14/2014   CLINICAL DATA:  68 year old female with productive cough, shortness of breath, nausea, diarrhea. Abnormal LFTs. Initial encounter.  EXAM: CT CHEST, ABDOMEN, AND PELVIS WITH CONTRAST  TECHNIQUE: Multidetector CT imaging of the chest, abdomen and pelvis was performed following the standard protocol during bolus administration of intravenous contrast.  CONTRAST:  116mL OMNIPAQUE IOHEXOL 300 MG/ML   SOLN  COMPARISON:  Chest radiographs 1752 hr today.  FINDINGS: CT CHEST FINDINGS  Moderate size layering right pleural effusion. Associated compressive atelectasis at the right lung base, affecting both the lower and middle lobes. No superimposed pulmonary consolidation. Small layering left pleural effusion with mild left lung compressive atelectasis. Major airways are patent. There is underlying centrilobular emphysema most apparent in the upper lobes.  No pericardial effusion. No mediastinal or hilar lymphadenopathy. Negative thoracic inlet. Major mediastinal vascular structures are normal except for atherosclerosis. No axillary lymphadenopathy.  Osteopenia.   No acute osseous abnormality identified.  CT ABDOMEN AND PELVIS FINDINGS  Chronic appearing moderate L1 and mild L2 compression fractures. Mild retrolisthesis at those levels. Other lumbar levels intact. No acute osseous abnormality identified.  Moderate volume of ascites. Superimposed distended bladder. Negative uterus and adnexa.  Rounded 38 mm diameter intermediate density soft tissue in the rectum about 6 cm proximal to the anal verge (series 6, image 104 and sagittal image 66). This appears E centric to the lumen. Upstream proximal rectum is decompressed. No mesenteric lymphadenopathy identified.  Decompressed sigmoid colon with diverticulosis. Decompressed left colon. Redundant splenic flexure and transverse colon containing gas. Negative right colon and appendix. Distal small bowel within normal limits. No dilated small bowel. Decompressed stomach and duodenum.  Nodular liver contour (anterior right lobe series 6, image 57). Fluid in the gallbladder wall with Hyperenhancing gallbladder wall with dependent cholelithiasis (series 6, image 71), but the gallbladder is not distended. Occasional small calcified granulomas in the liver, no other discrete liver lesion. Portal venous system is patent. Negative spleen.  Pancreas, adrenal glands, and kidneys are  within normal limits.  Major arterial structures in the abdomen and pelvis are patent with moderate Aortoiliac calcified atherosclerosis noted.  Porta hepatis lymph nodes up to 11 mm short axis. Small ventral mesenteric varices. Small distal paraesophageal varices. No other lymphadenopathy.  IMPRESSION: 1. Moderate layering right pleural effusion with compression of the right lung, but no pneumonia. Small layering left pleural effusion. 2. Ascites, and nodular liver suggesting cirrhosis. No liver mass. 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 Mild porta hepatis lymphadenopathy likely reactive due to chronic liver disease. 3. Suggestion of a 3-4 cm soft tissue mass of the rectum partially narrowing the lumen about 6 cm upstream of the anal verge. Recommend colonoscopy. No surrounding lymphadenopathy. No distant metastatic disease. 4. Distended bladder. 5.  Cholelithiasis. 6. Chronic appearing L1 and L2 compression fractures. 7. Calcified aortic atherosclerosis in the chest and abdomen.   Electronically Signed   By: Lars Pinks M.D.   On: 05/14/2014 20:32   US Paracentesis  05/16/2014   CLINICAL DATA:  Abdominal pain, leukocytosis, ascites. Request diagnostic paracentesis.  EXAM: ULTRASOUND GUIDED PARACENTESIS  COMPARISON:  CT scan of the abdomen from 05/14/2014.  PROCEDURE: An ultrasound guided paracentesis was thoroughly discussed with the patient and questions answered. The benefits, risks, alternatives and complications were also discussed. The patient understands and wishes to proceed with the procedure. Written consent was obtained.  Small volume ascites over the posterior lateral aspect of the liver. Small volume ascites in the left lower quadrant. The volume of ascites has overall significantly reduced in the interim from her CT scan done 10/21.  Ultrasound was performed to localize and mark an adequate pocket of fluid in the left lower quadrant of the  abdomen. The area was then prepped and draped in the normal sterile fashion. 1% Lidocaine was used for local anesthesia. Under ultrasound guidance a 19 gauge Yueh catheter was introduced. Paracentesis was performed. The catheter was removed and a dressing applied.  COMPLICATIONS: None immediate  FINDINGS: A total of approximately 140 mL of clear yellow fluid was removed. A fluid sample was sent for laboratory analysis.  IMPRESSION: Successful ultrasound guided paracentesis yielding 140 mL of ascites.  Read by: Ascencion Dike PA-C   Electronically Signed   By: Markus Daft M.D.   On: 05/16/2014 15:20   Dg Chest Port 1 View  05/17/2014   CLINICAL DATA:  Shortness of breath. Follow-up pleural effusions. Subsequent encounter.  EXAM: PORTABLE CHEST - 1 VIEW  COMPARISON:  Chest CT and radiographs 05/14/2014.  FINDINGS: 1620 hr. Left arm PICC extends to the lower SVC. There are persistent right-greater-than-left pleural effusions. The right pleural effusion appears slightly smaller. There is associated bibasilar atelectasis. No pneumothorax, confluent airspace opacity or pulmonary edema is demonstrated. The heart size and mediastinal contours are stable.  IMPRESSION: Left arm PICC tip in the lower SVC. Persistent bilateral pleural effusions with bibasilar atelectasis.   Electronically Signed   By: Camie Patience M.D.   On: 05/17/2014 20:00    Microbiology: Recent Results (from the past 240 hour(s))  BODY FLUID CULTURE     Status: None   Collection Time    05/16/14  2:53 PM      Result Value Ref Range Status   Specimen Description PLEURAL   Final   Special Requests Normal   Final   Gram Stain     Final   Value: RARE WBC PRESENT, PREDOMINANTLY MONONUCLEAR     NO ORGANISMS SEEN     Performed at [facility]   Culture     Final   Value: NO GROWTH 3 DAYS     Performed at [facility]   Report Status 05/20/2014 FINAL   Final  CLOSTRIDIUM DIFFICILE BY PCR     Status: None   Collection Time     05/17/14  9:17 PM      Result Value Ref Range Status   C difficile by pcr NEGATIVE  NEGATIVE Final   Comment: Performed at [place]: Basic Metabolic Panel:  Recent Labs Lab 05/21/14 0529 05/22/14 0530 05/23/14 0830 05/24/14 0500 05/25/14 0609  NA 127* 130* 132* 136* 137  K 4.1 4.7 4.3 4.2 3.9  CL 96 99 99 104 107  CO2 24 23 24 24 22   GLUCOSE 97 76 88 87 74  BUN 5* 5* 5* 5* 4*  CREATININE 0.40* 0.38* 0.37* 0.37* 0.43*  CALCIUM 7.6* 7.8* 8.1* 8.1* 7.1*   Liver Function Tests:  Recent Labs Lab 05/22/14 0530  AST 44*  ALT 15  ALKPHOS 155*  BILITOT 0.6  PROT 5.3*  ALBUMIN 1.6*   No results found for this basename: LIPASE, AMYLASE,  in the last 168 hours No results found for this basename: AMMONIA,  in the last 168 hours CBC:  Recent Labs Lab 05/19/14 0400 05/20/14 0500 05/22/14 0530 05/23/14 1445  WBC 11.8* 12.5* 14.0* 13.0*  HGB 8.4* 8.3* 8.4* 8.6*  HCT 23.6* 23.4* 24.3* 25.2*  MCV 102.2* 102.6* 105.2* 104.1*  PLT 239 242 229 250   Cardiac Enzymes: No results found for this basename: CKTOTAL, CKMB, CKMBINDEX, TROPONINI,  in the last 168 hours BNP: BNP (last 3 results)  Recent Labs  05/14/14 1732  PROBNP 297.9*   CBG: No results found for this basename: GLUCAP,  in the last 168 hours  Time coordinating discharge: Over 30 minutes

## 2014-05-25 NOTE — Discharge Instructions (Signed)
Pneumonia Pneumonia is an infection of the lungs.  CAUSES Pneumonia may be caused by bacteria or a virus. Usually, these infections are caused by breathing infectious particles into the lungs (respiratory tract). SIGNS AND SYMPTOMS   Cough.  Fever.  Chest pain.  Increased rate of breathing.  Wheezing.  Mucus production. DIAGNOSIS  If you have the common symptoms of pneumonia, your health care provider will typically confirm the diagnosis with a chest X-ray. The X-ray will show an abnormality in the lung (pulmonary infiltrate) if you have pneumonia. Other tests of your blood, urine, or sputum may be done to find the specific cause of your pneumonia. Your health care provider may also do tests (blood gases or pulse oximetry) to see how well your lungs are working. TREATMENT  Some forms of pneumonia may be spread to other people when you cough or sneeze. You may be asked to wear a mask before and during your exam. Pneumonia that is caused by bacteria is treated with antibiotic medicine. Pneumonia that is caused by the influenza virus may be treated with an antiviral medicine. Most other viral infections must run their course. These infections will not respond to antibiotics.  HOME CARE INSTRUCTIONS   Cough suppressants may be used if you are losing too much rest. However, coughing protects you by clearing your lungs. You should avoid using cough suppressants if you can.  Your health care provider may have prescribed medicine if he or she thinks your pneumonia is caused by bacteria or influenza. Finish your medicine even if you start to feel better.  Your health care provider may also prescribe an expectorant. This loosens the mucus to be coughed up.  Take medicines only as directed by your health care provider.  Do not smoke. Smoking is a common cause of bronchitis and can contribute to pneumonia. If you are a smoker and continue to smoke, your cough may last several weeks after your  pneumonia has cleared.  A cold steam vaporizer or humidifier in your room or home may help loosen mucus.  Coughing is often worse at night. Sleeping in a semi-upright position in a recliner or using a couple pillows under your head will help with this.  Get rest as you feel it is needed. Your body will usually let you know when you need to rest. PREVENTION A pneumococcal shot (vaccine) is available to prevent a common bacterial cause of pneumonia. This is usually suggested for:  People over 65 years old.  Patients on chemotherapy.  People with chronic lung problems, such as bronchitis or emphysema.  People with immune system problems. If you are over 65 or have a high risk condition, you may receive the pneumococcal vaccine if you have not received it before. In some countries, a routine influenza vaccine is also recommended. This vaccine can help prevent some cases of pneumonia.You may be offered the influenza vaccine as part of your care. If you smoke, it is time to quit. You may receive instructions on how to stop smoking. Your health care provider can provide medicines and counseling to help you quit. SEEK MEDICAL CARE IF: You have a fever. SEEK IMMEDIATE MEDICAL CARE IF:   Your illness becomes worse. This is especially true if you are elderly or weakened from any other disease.  You cannot control your cough with suppressants and are losing sleep.  You begin coughing up blood.  You develop pain which is getting worse or is uncontrolled with medicines.  Any of the symptoms   which initially brought you in for treatment are getting worse rather than better. °· You develop shortness of breath or chest pain. °MAKE SURE YOU:  °· Understand these instructions. °· Will watch your condition. °· Will get help right away if you are not doing well or get worse. °Document Released: 07/13/2005 Document Revised: 11/27/2013 Document Reviewed: 10/02/2010 °ExitCare® Patient Information ©2015  [employer]. This information is not intended to replace advice given to you by your health care provider. Make sure you discuss any questions you have with your health care provider. ° ° °Hyponatremia  °Hyponatremia is when the amount of salt (sodium) in your blood is too low. When sodium levels are low, your cells will absorb extra water and swell. The swelling happens throughout the body, but it mostly affects the brain. Severe brain swelling (cerebral edema), seizures, or coma can happen.  °CAUSES  °· Heart, kidney, or liver problems. °· Thyroid problems. °· Adrenal gland problems. °· Severe vomiting and diarrhea. °· Certain medicines or illegal drugs. °· Dehydration. °· Drinking too much water. °· Low-sodium diet. °SYMPTOMS  °· Nausea and vomiting. °· Confusion. °· Lethargy. °· Agitation. °· Headache. °· Twitching or shaking (seizures). °· Unconsciousness. °· Appetite loss. °· Muscle weakness and cramping. °DIAGNOSIS  °Hyponatremia is identified by a simple blood test. Your caregiver will perform a history and physical exam to try to find the cause and type of hyponatremia. Other tests may be needed to measure the amount of sodium in your blood and urine. °TREATMENT  °Treatment will depend on the cause.  °· Fluids may be given through the vein (IV). °· Medicines may be used to correct the sodium imbalance. If medicines are causing the problem, they will need to be adjusted. °· Water or fluid intake may be restricted to restore proper balance. °The speed of correcting the sodium problem is very important. If the problem is corrected too fast, nerve damage (sometimes unchangeable) can happen. °HOME CARE INSTRUCTIONS  °· Only take medicines as directed by your caregiver. Many medicines can make hyponatremia worse. Discuss all your medicines with your caregiver. °· Carefully follow any recommended diet, including any fluid restrictions. °· You may be asked to repeat lab tests. Follow these directions. °· Avoid  alcohol and recreational drugs. °SEEK MEDICAL CARE IF:  °· You develop worsening nausea, fatigue, headache, confusion, or weakness. °· Your original hyponatremia symptoms return. °· You have problems following the recommended diet. °SEEK IMMEDIATE MEDICAL CARE IF:  °· You have a seizure. °· You faint. °· You have ongoing diarrhea or vomiting. °MAKE SURE YOU:  °· Understand these instructions. °· Will watch your condition. °· Will get help right away if you are not doing well or get worse. °Document Released: 07/03/2002 Document Revised: 10/05/2011 Document Reviewed: 12/28/2010 °ExitCare® Patient Information ©2015 [employer]. This information is not intended to replace advice given to you by your health care provider. Make sure you discuss any questions you have with your health care provider. ° °

## 2014-05-26 NOTE — Progress Notes (Signed)
Quick Note:  Let her know polyp is completely benign. Do not recommend a recall colon given co-morbidities at this time and polyp was pedunculated and removed to stalk/mucosal interface She does not need 11/5 visit with me re: polyp - please cancel that Dr. Henrene Pastor is primary GI MD and can decide if/when she needs other f/u re: liver problems ______

## 2014-05-30 DIAGNOSIS — E871 Hypo-osmolality and hyponatremia: Secondary | ICD-10-CM | POA: Diagnosis not present

## 2014-05-30 DIAGNOSIS — S8000XA Contusion of unspecified knee, initial encounter: Secondary | ICD-10-CM | POA: Diagnosis not present

## 2014-05-30 DIAGNOSIS — M79671 Pain in right foot: Secondary | ICD-10-CM | POA: Diagnosis not present

## 2014-05-30 DIAGNOSIS — D649 Anemia, unspecified: Secondary | ICD-10-CM | POA: Diagnosis not present

## 2014-05-31 ENCOUNTER — Ambulatory Visit: Payer: Medicare Other | Admitting: Internal Medicine

## 2014-06-05 DIAGNOSIS — E871 Hypo-osmolality and hyponatremia: Secondary | ICD-10-CM | POA: Diagnosis not present

## 2014-06-12 DIAGNOSIS — K7030 Alcoholic cirrhosis of liver without ascites: Secondary | ICD-10-CM | POA: Diagnosis not present

## 2014-06-15 ENCOUNTER — Inpatient Hospital Stay: Payer: Medicare Other | Admitting: Adult Health

## 2014-06-25 ENCOUNTER — Encounter: Payer: Self-pay | Admitting: Adult Health

## 2014-06-25 ENCOUNTER — Ambulatory Visit (INDEPENDENT_AMBULATORY_CARE_PROVIDER_SITE_OTHER): Payer: Medicare Other | Admitting: Adult Health

## 2014-06-25 ENCOUNTER — Ambulatory Visit (INDEPENDENT_AMBULATORY_CARE_PROVIDER_SITE_OTHER)
Admission: RE | Admit: 2014-06-25 | Discharge: 2014-06-25 | Disposition: A | Discharge status: Home / Self Care | Payer: Medicare Other | Source: Ambulatory Visit | Attending: Adult Health | Admitting: Adult Health

## 2014-06-25 ENCOUNTER — Encounter (INDEPENDENT_AMBULATORY_CARE_PROVIDER_SITE_OTHER): Payer: Self-pay

## 2014-06-25 VITALS — BP 126/78 | HR 101 | Temp 97.1°F | Ht 62.0 in | Wt 84.0 lb

## 2014-06-25 DIAGNOSIS — J439 Emphysema, unspecified: Secondary | ICD-10-CM | POA: Diagnosis not present

## 2014-06-25 DIAGNOSIS — J90 Pleural effusion, not elsewhere classified: Secondary | ICD-10-CM

## 2014-06-25 DIAGNOSIS — J438 Other emphysema: Secondary | ICD-10-CM | POA: Diagnosis not present

## 2014-06-25 DIAGNOSIS — Z23 Encounter for immunization: Secondary | ICD-10-CM | POA: Diagnosis not present

## 2014-06-25 DIAGNOSIS — J189 Pneumonia, unspecified organism: Secondary | ICD-10-CM | POA: Diagnosis not present

## 2014-06-25 DIAGNOSIS — F17200 Nicotine dependence, unspecified, uncomplicated: Secondary | ICD-10-CM | POA: Diagnosis not present

## 2014-06-25 NOTE — Patient Instructions (Signed)
Great job on not smoking -keep up good work.  Follow up Dr. Gwenette Greet in 4-6 weeks with PFT

## 2014-06-25 NOTE — Assessment & Plan Note (Signed)
Clinically improved. Chest x-ray shows no sign of acute process

## 2014-06-25 NOTE — Progress Notes (Signed)
Subjective:    Patient ID: Cassidy Barrett, female    DOB: 08/19/1945, 68 y.o.   MRN: [id number]  HPI 68 yo former smoker (04/2014) referred by [facility] after hospitalization for pleural effusion, CAP and COPD exacerbation . Patient has alcoholic cirrhosis, ascites, hyponatremia   06/25/2014 Pulmonary Consult/Post Hospital follow up  Patient presents for an initial pulmonary consult. Patient was admitted October 19 through October 30 for COPD exacerbation, acute hypoxic respiratory failure with community-acquired pneumonia and a right pleural effusion She was treated with IV antibiotics. CT chest showed a moderate layering right pleural effusion with compression of the right lung. CT abdomen showed possible liver cirrhosis with ascites and a 3-4 cm soft tissue mass in the rectum. Labs were remarkable for elevated LFTs and low sodium She underwent a paracentesis with 140 cc removed. She was seen by nephrology, and was treated with hypertonic IV fluids and salt tablets with improvement of her sodium Sodium returned to normal prior to discharge. Patient was seen by gastroenterology and underwent colonoscopy with polyp removal , path showed a tubovillous adenoma.  No formal dx of COPD in past. Has quit smoking since discharge. Denies any alcohol intake since discharge Since discharge, she is feeling improved. She has decreased shortness of breath or cough. Chest x-ray today is improved with resolution of pleural effusion. She denies any chest pain, orthopnea, PND or leg swelling She has home health and physical therapy at home Feels that her strength is improving.     Past Medical History  Diagnosis Date  . Osteoporosis   . Alcohol abuse   . Cirrhosis   . Ascites   . Emphysema of lung   . Cholelithiasis   . Compression fracture of lumbar vertebra      L1 and L2  . Atherosclerosis of aorta   . Macrocytic anemia    Current Outpatient Prescriptions on File Prior to Visit  Medication  Sig Dispense Refill  . albuterol (PROVENTIL HFA;VENTOLIN HFA) 108 (90 BASE) MCG/ACT inhaler Inhale 2 puffs into the lungs every 4 (four) hours as needed for wheezing or shortness of breath. 1 Inhaler 0  . folic acid (FOLVITE) 1 MG tablet Take 1 tablet (1 mg total) by mouth daily. 30 tablet 0  . Multiple Vitamin (MULTIVITAMIN WITH MINERALS) TABS tablet Take 1 tablet by mouth daily. 30 tablet 0  . ondansetron (ZOFRAN) 4 MG tablet Take 1 tablet (4 mg total) by mouth every 6 (six) hours as needed for nausea. 20 tablet 0  . potassium chloride SA (K-DUR,KLOR-CON) 20 MEQ tablet Take 1 tablet (20 mEq total) by mouth daily. 30 tablet 0  . thiamine 100 MG tablet Take 1 tablet (100 mg total) by mouth daily. 30 tablet 0  . Vitamin D, Ergocalciferol, (DRISDOL) 50000 UNITS CAPS capsule Take 50,000 Units by mouth every 7 (seven) days. Takes on wednesdays    . guaiFENesin-dextromethorphan (ROBITUSSIN DM) 100-10 MG/5ML syrup Take 5 mLs by mouth every 4 (four) hours as needed for cough. (Patient not taking: Reported on 06/25/2014) 118 mL 0   No current facility-administered medications on file prior to visit.    SH:  Smoked ~1 PPD 50 yr (quit 04/2014)  Worked office work.  No extensive  travel  No unusual hobbies or pets Married /Wife. (spouse works for [employer]) ETOH daily ~4-6 beers/ and glass of wine -none since 04/2014.      Review of Systems Constitutional:   No  weight loss, night sweats,  Fevers, chills,  +fatigue,  or  lassitude.  HEENT:   No headaches,  Difficulty swallowing,  Tooth/dental problems, or  Sore throat,                No sneezing, itching, ear ache, nasal congestion, post nasal drip,   CV:  No chest pain,  Orthopnea, PND, swelling in lower extremities, anasarca, dizziness, palpitations, syncope.   GI  No heartburn, indigestion, abdominal pain, nausea, vomiting, diarrhea, change in bowel habits, loss of appetite, bloody stools.   Resp:   No excess mucus, no productive cough,  No  non-productive cough,  No coughing up of blood.  No change in color of mucus.  No wheezing.  No chest wall deformity  Skin: no rash or lesions.  GU: no dysuria, change in color of urine, no urgency or frequency.  No flank pain, no hematuria   MS:  No joint pain or swelling.  No decreased range of motion.  No back pain.  Psych:  No change in mood or affect. No depression or anxiety.  No memory loss.         Objective:   Physical Exam GEN: A/Ox3; pleasant , NAD, thin, cachexic   HEENT:  [place]/AT,  EACs-clear, TMs-wnl, NOSE-clear, THROAT-clear, no lesions, no postnasal drip or exudate noted.   NECK:  Supple w/ fair ROM; no JVD; normal carotid impulses w/o bruits; no thyromegaly or nodules palpated; no lymphadenopathy.  RESP  Decreased BS in bases   w/o, wheezes/ rales/ or rhonchi.no accessory muscle use, no dullness to percussion  CARD:  RRR, no m/r/g  , no peripheral edema, pulses intact, no cyanosis or clubbing.  GI:   Soft & nt; nml bowel sounds; no organomegaly or masses detected.  Musco: Warm bil, no deformities or joint swelling noted.   Neuro: alert, no focal deficits noted.    Skin: Warm, no lesions or rashes   CXR 06/25/2014  Emphysematous changes consistent with COPD.  No acute abnormalities.  Bibasilar effusions and atelectasis seen on previous exam resolved.      Assessment & Plan:

## 2014-06-25 NOTE — Assessment & Plan Note (Addendum)
Recent exacerbation in the setting of ongoing smoking Needs PFTs Congratulated on smoking cessation   Plan  Continue on current regimen Return in 4-6 weeks with PFTs

## 2014-06-25 NOTE — Assessment & Plan Note (Addendum)
Right-sided pleural effusion w/ ?parapneumonic although did not show acute consolidation on CT vs  Complication of alcoholic cirrhosis with ascites/fluid overload , hyponatremia  Resolved on cxr today  Encouraged on smoking and etoh cessation  Case reviewed in detail with Dr. Melvyn Novas

## 2014-06-25 NOTE — Progress Notes (Signed)
hosp notes and present ov reviewed/ agree with a/p as outlined

## 2014-06-27 DIAGNOSIS — K7030 Alcoholic cirrhosis of liver without ascites: Secondary | ICD-10-CM | POA: Diagnosis not present

## 2014-07-13 ENCOUNTER — Encounter: Payer: Self-pay | Admitting: Pulmonary Disease

## 2014-07-16 ENCOUNTER — Encounter: Payer: Self-pay | Admitting: Pulmonary Disease

## 2014-07-18 DIAGNOSIS — K7030 Alcoholic cirrhosis of liver without ascites: Secondary | ICD-10-CM | POA: Diagnosis not present

## 2014-07-24 ENCOUNTER — Institutional Professional Consult (permissible substitution): Payer: Medicare Other | Admitting: Pulmonary Disease

## 2014-08-23 ENCOUNTER — Ambulatory Visit: Payer: Medicare Other | Admitting: Pulmonary Disease

## 2014-09-12 ENCOUNTER — Ambulatory Visit: Payer: Medicare Other | Admitting: Pulmonary Disease

## 2014-10-05 ENCOUNTER — Ambulatory Visit: Payer: Medicare Other | Admitting: Pulmonary Disease

## 2014-10-08 DIAGNOSIS — D649 Anemia, unspecified: Secondary | ICD-10-CM | POA: Diagnosis not present

## 2014-10-08 DIAGNOSIS — M549 Dorsalgia, unspecified: Secondary | ICD-10-CM | POA: Diagnosis not present

## 2014-10-08 DIAGNOSIS — Q675 Congenital deformity of spine: Secondary | ICD-10-CM | POA: Diagnosis not present

## 2014-10-08 DIAGNOSIS — I10 Essential (primary) hypertension: Secondary | ICD-10-CM | POA: Diagnosis not present

## 2014-10-08 DIAGNOSIS — E871 Hypo-osmolality and hyponatremia: Secondary | ICD-10-CM | POA: Diagnosis not present

## 2014-10-08 DIAGNOSIS — M810 Age-related osteoporosis without current pathological fracture: Secondary | ICD-10-CM | POA: Diagnosis not present

## 2014-10-31 ENCOUNTER — Ambulatory Visit: Payer: Medicare Other | Admitting: Pulmonary Disease

## 2014-11-06 DIAGNOSIS — M4854XA Collapsed vertebra, not elsewhere classified, thoracic region, initial encounter for fracture: Secondary | ICD-10-CM | POA: Diagnosis not present

## 2014-11-06 DIAGNOSIS — M4856XA Collapsed vertebra, not elsewhere classified, lumbar region, initial encounter for fracture: Secondary | ICD-10-CM | POA: Diagnosis not present

## 2014-11-08 ENCOUNTER — Other Ambulatory Visit: Payer: Self-pay | Admitting: Orthopedic Surgery

## 2014-11-08 DIAGNOSIS — S32000A Wedge compression fracture of unspecified lumbar vertebra, initial encounter for closed fracture: Secondary | ICD-10-CM

## 2014-11-08 DIAGNOSIS — S22000A Wedge compression fracture of unspecified thoracic vertebra, initial encounter for closed fracture: Secondary | ICD-10-CM

## 2014-11-29 ENCOUNTER — Ambulatory Visit
Admission: RE | Admit: 2014-11-29 | Discharge: 2014-11-29 | Disposition: A | Discharge status: Home / Self Care | Payer: Medicare Other | Source: Ambulatory Visit | Attending: Orthopedic Surgery | Admitting: Orthopedic Surgery

## 2014-11-29 DIAGNOSIS — M5127 Other intervertebral disc displacement, lumbosacral region: Secondary | ICD-10-CM | POA: Diagnosis not present

## 2014-11-29 DIAGNOSIS — M5134 Other intervertebral disc degeneration, thoracic region: Secondary | ICD-10-CM | POA: Diagnosis not present

## 2014-11-29 DIAGNOSIS — M5136 Other intervertebral disc degeneration, lumbar region: Secondary | ICD-10-CM | POA: Diagnosis not present

## 2014-11-29 DIAGNOSIS — S22000A Wedge compression fracture of unspecified thoracic vertebra, initial encounter for closed fracture: Secondary | ICD-10-CM

## 2014-11-29 DIAGNOSIS — M4316 Spondylolisthesis, lumbar region: Secondary | ICD-10-CM | POA: Diagnosis not present

## 2014-11-29 DIAGNOSIS — M4854XA Collapsed vertebra, not elsewhere classified, thoracic region, initial encounter for fracture: Secondary | ICD-10-CM | POA: Diagnosis not present

## 2014-11-29 DIAGNOSIS — S32000A Wedge compression fracture of unspecified lumbar vertebra, initial encounter for closed fracture: Secondary | ICD-10-CM

## 2014-11-29 DIAGNOSIS — M5124 Other intervertebral disc displacement, thoracic region: Secondary | ICD-10-CM | POA: Diagnosis not present

## 2014-12-03 ENCOUNTER — Other Ambulatory Visit: Payer: Self-pay | Admitting: Orthopedic Surgery

## 2014-12-03 DIAGNOSIS — M4851XA Collapsed vertebra, not elsewhere classified, occipito-atlanto-axial region, initial encounter for fracture: Secondary | ICD-10-CM | POA: Diagnosis not present

## 2014-12-10 NOTE — Pre-Procedure Instructions (Signed)
Cassidy Barrett  12/10/2014   Your procedure is scheduled on:  Wednesday Dec 12, 2014 at 11:30 AM.  Report to [facility] Admitting at 8:30 AM.  Call this number if you have problems the morning of surgery: [phone]   Remember:   Do not eat food or drink liquids after midnight.   Take these medicines the morning of surgery with A SIP OF WATER: Acetaminophen (Tylenol) if needed   Please stop taking any vitamins, herbal medications, Advil, Ibuprofen, Motrin, Alleve, etc   Do not wear jewelry, make-up or nail polish.  Do not wear lotions, powders, or perfumes.  Do not shave 48 hours prior to surgery.   Do not bring valuables to the hospital.  [facility] is not responsible or any belongings or valuables.               Contacts, dentures or bridgework may not be worn into surgery.  Leave suitcase in the car. After surgery it may be brought to your room.  For patients admitted to the hospital, discharge time is determined by your treatment team.               Patients discharged the day of surgery will not be allowed to drive home.  Name and phone number of your driver:   Special Instructions: Shower using CHG soap the night before and the morning of your surgery   Please read over the following fact sheets that you were given: Pain Booklet, Coughing and Deep Breathing, MRSA Information and Surgical Site Infection Prevention

## 2014-12-11 ENCOUNTER — Encounter (HOSPITAL_COMMUNITY)
Admission: RE | Admit: 2014-12-11 | Discharge: 2014-12-11 | Disposition: A | Discharge status: Home / Self Care | Payer: Medicare Other | Source: Ambulatory Visit | Attending: Orthopedic Surgery | Admitting: Orthopedic Surgery

## 2014-12-11 ENCOUNTER — Encounter (HOSPITAL_COMMUNITY): Payer: Self-pay

## 2014-12-11 DIAGNOSIS — J449 Chronic obstructive pulmonary disease, unspecified: Secondary | ICD-10-CM | POA: Diagnosis not present

## 2014-12-11 DIAGNOSIS — Z01818 Encounter for other preprocedural examination: Secondary | ICD-10-CM

## 2014-12-11 HISTORY — DX: Other complications of anesthesia, initial encounter: T88.59XA

## 2014-12-11 HISTORY — DX: Unspecified osteoarthritis, unspecified site: M19.90

## 2014-12-11 HISTORY — DX: Pneumonia, unspecified organism: J18.9

## 2014-12-11 HISTORY — DX: Adverse effect of unspecified anesthetic, initial encounter: T41.45XA

## 2014-12-11 LAB — CBC WITH DIFFERENTIAL/PLATELET
Basophils Absolute: 0.1 10*3/uL (ref 0.0–0.1)
Basophils Relative: 1 % (ref 0–1)
Eosinophils Absolute: 0.2 10*3/uL (ref 0.0–0.7)
Eosinophils Relative: 2 % (ref 0–5)
HCT: 38.6 % (ref 36.0–46.0)
Hemoglobin: 13.2 g/dL (ref 12.0–15.0)
Lymphocytes Relative: 30 % (ref 12–46)
Lymphs Abs: 2.3 10*3/uL (ref 0.7–4.0)
MCH: 32 pg (ref 26.0–34.0)
MCHC: 34.2 g/dL (ref 30.0–36.0)
MCV: 93.7 fL (ref 78.0–100.0)
MONOS PCT: 10 % (ref 3–12)
Monocytes Absolute: 0.8 10*3/uL (ref 0.1–1.0)
NEUTROS ABS: 4.3 10*3/uL (ref 1.7–7.7)
NEUTROS PCT: 57 % (ref 43–77)
Platelets: 205 10*3/uL (ref 150–400)
RBC: 4.12 MIL/uL (ref 3.87–5.11)
RDW: 14.9 % (ref 11.5–15.5)
WBC: 7.6 10*3/uL (ref 4.0–10.5)

## 2014-12-11 LAB — URINALYSIS, ROUTINE W REFLEX MICROSCOPIC
GLUCOSE, UA: NEGATIVE mg/dL
Hgb urine dipstick: NEGATIVE
KETONES UR: 15 mg/dL — AB
NITRITE: NEGATIVE
Protein, ur: NEGATIVE mg/dL
Specific Gravity, Urine: 1.023 (ref 1.005–1.030)
Urobilinogen, UA: 1 mg/dL (ref 0.0–1.0)
pH: 5 (ref 5.0–8.0)

## 2014-12-11 LAB — PROTIME-INR
INR: 1.01 (ref 0.00–1.49)
PROTHROMBIN TIME: 13.4 s (ref 11.6–15.2)

## 2014-12-11 LAB — COMPREHENSIVE METABOLIC PANEL
ALT: 26 U/L (ref 14–54)
AST: 44 U/L — AB (ref 15–41)
Albumin: 3.4 g/dL — ABNORMAL LOW (ref 3.5–5.0)
Alkaline Phosphatase: 346 U/L — ABNORMAL HIGH (ref 38–126)
Anion gap: 10 (ref 5–15)
BUN: 8 mg/dL (ref 6–20)
CALCIUM: 9.7 mg/dL (ref 8.9–10.3)
CO2: 26 mmol/L (ref 22–32)
Chloride: 97 mmol/L — ABNORMAL LOW (ref 101–111)
Creatinine, Ser: 0.56 mg/dL (ref 0.44–1.00)
GFR calc non Af Amer: >60 mL/min (ref >60)
GLUCOSE: 105 mg/dL — AB (ref 65–99)
POTASSIUM: 4.1 mmol/L (ref 3.5–5.1)
Sodium: 133 mmol/L — ABNORMAL LOW (ref 135–145)
Total Bilirubin: 0.5 mg/dL (ref 0.3–1.2)
Total Protein: 8.6 g/dL — ABNORMAL HIGH (ref 6.5–8.1)

## 2014-12-11 LAB — URINE MICROSCOPIC-ADD ON

## 2014-12-11 LAB — APTT: aPTT: 28 seconds (ref 24–37)

## 2014-12-11 LAB — SURGICAL PCR SCREEN
MRSA, PCR: NEGATIVE
Staphylococcus aureus: NEGATIVE

## 2014-12-11 MED ORDER — CEFAZOLIN SODIUM-DEXTROSE 2-3 GM-% IV SOLR
2.0000 g | INTRAVENOUS | Status: AC
  Administered 2014-12-12: 2 g via INTRAVENOUS
  Filled 2014-12-11: qty 50

## 2014-12-11 NOTE — Progress Notes (Addendum)
Anesthesia Chart Review:  Pt is 69 year old female scheduled for kyphoplasty for T12 compression fracture on 12/12/2014 with Dr. Lynann Bologna.   PMH includes: alcohol abuse, cirrhosis, ascites, macrocytic anemia, emphysema, atherosclerosis of aorta. Current smoker. BMI 18.   Pt hospitalized 10/19-10/30/2015 for acute respiratory failure with hypoxia, CAP, pleural effusion, COPD exacerbation.  Preoperative labs reviewed.  CMET, surgical PCR screen and urinalysis still pending.   CXR pending.   EKG 05/14/2014: Sinus rhythm. Probable left atrial enlargement. Anterior infarct, age indeterminate. Baseline wander in lead(s) V3. No old tracing to compare.   Echo 05/15/2014: - Left ventricle: The cavity size was normal. Wall thickness was normal. Systolic function was normal. The estimated ejection fraction was in the range of 55% to 60%. Wall motion was normal; there were no regional wall motion abnormalities. Dopplerparameters are consistent with abnormal left ventricular relaxation (grade 1 diastolic dysfunction).  If labs and CXR acceptable, I anticipate pt can proceed as scheduled.   Willeen Cass, FNP-BC [facility]/Anesthesiology Phone: [phone] 12/11/2014 4:36 PM  Addendum:  12/11/14 CXR: IMPRESSION: 1. No acute abnormality. 2. Stable changes of COPD. 3. Previously described T12 and L1 compression deformities with acute kyphosis.  CMET noted. ALK PH elevated at 346, up from 155 on 05/22/14. AST/ALT minimally elevated.  PLT count and PT/PTT WNL. UA is still in process.   [facility]/Anesthesiology Phone [phone] 12/11/2014 4:54 PM

## 2014-12-11 NOTE — Progress Notes (Signed)
Nurse had Levada Dy, NP review EKG.

## 2014-12-11 NOTE — Progress Notes (Signed)
PCP is Everardo Beals, NP and patient saw Rexene Edison, NP at [facility]. Patient denied having any acute cardiac or pulmonary issues. Patients wife Gerda Diss at chair side during PAT visit.

## 2014-12-12 ENCOUNTER — Inpatient Hospital Stay (HOSPITAL_COMMUNITY): Payer: Medicare Other | Admitting: Emergency Medicine

## 2014-12-12 ENCOUNTER — Inpatient Hospital Stay (HOSPITAL_COMMUNITY): Payer: Medicare Other

## 2014-12-12 ENCOUNTER — Encounter (HOSPITAL_COMMUNITY)
Admission: RE | Disposition: A | Discharge status: Home / Self Care | Payer: Self-pay | Source: Ambulatory Visit | Attending: Orthopedic Surgery

## 2014-12-12 ENCOUNTER — Inpatient Hospital Stay (HOSPITAL_COMMUNITY)
Admission: RE | Admit: 2014-12-12 | Discharge: 2014-12-13 | DRG: 517 | Disposition: A | Discharge status: Home / Self Care | Payer: Medicare Other | Source: Ambulatory Visit | Attending: Orthopedic Surgery | Admitting: Orthopedic Surgery

## 2014-12-12 ENCOUNTER — Encounter (HOSPITAL_COMMUNITY): Payer: Self-pay | Admitting: *Deleted

## 2014-12-12 ENCOUNTER — Inpatient Hospital Stay (HOSPITAL_COMMUNITY): Payer: Medicare Other | Admitting: Anesthesiology

## 2014-12-12 DIAGNOSIS — S32010A Wedge compression fracture of first lumbar vertebra, initial encounter for closed fracture: Secondary | ICD-10-CM | POA: Diagnosis not present

## 2014-12-12 DIAGNOSIS — Z888 Allergy status to other drugs, medicaments and biological substances status: Secondary | ICD-10-CM | POA: Diagnosis not present

## 2014-12-12 DIAGNOSIS — Z419 Encounter for procedure for purposes other than remedying health state, unspecified: Secondary | ICD-10-CM

## 2014-12-12 DIAGNOSIS — J449 Chronic obstructive pulmonary disease, unspecified: Secondary | ICD-10-CM | POA: Diagnosis present

## 2014-12-12 DIAGNOSIS — D539 Nutritional anemia, unspecified: Secondary | ICD-10-CM | POA: Diagnosis present

## 2014-12-12 DIAGNOSIS — F1010 Alcohol abuse, uncomplicated: Secondary | ICD-10-CM | POA: Diagnosis present

## 2014-12-12 DIAGNOSIS — X58XXXA Exposure to other specified factors, initial encounter: Secondary | ICD-10-CM | POA: Diagnosis not present

## 2014-12-12 DIAGNOSIS — F17210 Nicotine dependence, cigarettes, uncomplicated: Secondary | ICD-10-CM | POA: Diagnosis present

## 2014-12-12 DIAGNOSIS — Z91048 Other nonmedicinal substance allergy status: Secondary | ICD-10-CM

## 2014-12-12 DIAGNOSIS — S22080A Wedge compression fracture of T11-T12 vertebra, initial encounter for closed fracture: Secondary | ICD-10-CM | POA: Diagnosis not present

## 2014-12-12 DIAGNOSIS — K7030 Alcoholic cirrhosis of liver without ascites: Secondary | ICD-10-CM | POA: Diagnosis present

## 2014-12-12 DIAGNOSIS — I700 Atherosclerosis of aorta: Secondary | ICD-10-CM | POA: Diagnosis present

## 2014-12-12 DIAGNOSIS — IMO0002 Reserved for concepts with insufficient information to code with codable children: Secondary | ICD-10-CM

## 2014-12-12 DIAGNOSIS — Z79899 Other long term (current) drug therapy: Secondary | ICD-10-CM | POA: Diagnosis not present

## 2014-12-12 DIAGNOSIS — Z9889 Other specified postprocedural states: Secondary | ICD-10-CM

## 2014-12-12 DIAGNOSIS — S51812A Laceration without foreign body of left forearm, initial encounter: Secondary | ICD-10-CM | POA: Diagnosis not present

## 2014-12-12 DIAGNOSIS — M1990 Unspecified osteoarthritis, unspecified site: Secondary | ICD-10-CM | POA: Diagnosis present

## 2014-12-12 DIAGNOSIS — M4854XA Collapsed vertebra, not elsewhere classified, thoracic region, initial encounter for fracture: Principal | ICD-10-CM | POA: Diagnosis present

## 2014-12-12 DIAGNOSIS — Z4889 Encounter for other specified surgical aftercare: Secondary | ICD-10-CM | POA: Diagnosis not present

## 2014-12-12 HISTORY — PX: KYPHOPLASTY: SHX5884

## 2014-12-12 SURGERY — KYPHOPLASTY
Anesthesia: General | Site: Back

## 2014-12-12 MED ORDER — OXYCODONE HCL 5 MG PO TABS: 5.0000 mg | ORAL_TABLET | Freq: Once | ORAL | Status: DC | PRN

## 2014-12-12 MED ORDER — MORPHINE SULFATE 2 MG/ML IJ SOLN
1.0000 mg | INTRAMUSCULAR | Status: DC | PRN
Start: 2014-12-12 — End: 2014-12-13

## 2014-12-12 MED ORDER — FOLIC ACID 1 MG PO TABS
1.0000 mg | ORAL_TABLET | Freq: Every day | ORAL | Status: DC
  Filled 2014-12-12: qty 1

## 2014-12-12 MED ORDER — GLYCOPYRROLATE 0.2 MG/ML IJ SOLN
INTRAMUSCULAR | Status: AC
  Filled 2014-12-12: qty 1

## 2014-12-12 MED ORDER — DOCUSATE SODIUM 100 MG PO CAPS
100.0000 mg | ORAL_CAPSULE | Freq: Two times a day (BID) | ORAL | Status: DC
  Administered 2014-12-12 (×2): 100 mg via ORAL
  Filled 2014-12-12 (×2): qty 1

## 2014-12-12 MED ORDER — ARTIFICIAL TEARS OP OINT
TOPICAL_OINTMENT | OPHTHALMIC | Status: DC | PRN
Start: 2014-12-12 — End: 2014-12-12
  Administered 2014-12-12: 1 via OPHTHALMIC

## 2014-12-12 MED ORDER — ACETAMINOPHEN 650 MG RE SUPP: 650.0000 mg | RECTAL | Status: DC | PRN

## 2014-12-12 MED ORDER — SODIUM CHLORIDE 0.9 % IJ SOLN
3.0000 mL | Freq: Two times a day (BID) | INTRAMUSCULAR | Status: DC
  Administered 2014-12-12: 3 mL via INTRAVENOUS

## 2014-12-12 MED ORDER — CYCLOBENZAPRINE HCL 10 MG PO TABS
10.0000 mg | ORAL_TABLET | Freq: Three times a day (TID) | ORAL | Status: DC | PRN
  Administered 2014-12-12 – 2014-12-13 (×2): 10 mg via ORAL
  Filled 2014-12-12 (×2): qty 1

## 2014-12-12 MED ORDER — SODIUM CHLORIDE 0.9 % IV SOLN: 250.0000 mL | INTRAVENOUS | Status: DC

## 2014-12-12 MED ORDER — PHENYLEPHRINE 40 MCG/ML (10ML) SYRINGE FOR IV PUSH (FOR BLOOD PRESSURE SUPPORT)
PREFILLED_SYRINGE | INTRAVENOUS | Status: AC
  Filled 2014-12-12: qty 10

## 2014-12-12 MED ORDER — LIDOCAINE HCL (CARDIAC) 20 MG/ML IV SOLN
INTRAVENOUS | Status: DC | PRN
  Administered 2014-12-12: 80 mg via INTRAVENOUS

## 2014-12-12 MED ORDER — POVIDONE-IODINE 7.5 % EX SOLN
Freq: Once | CUTANEOUS | Status: DC
  Filled 2014-12-12: qty 118

## 2014-12-12 MED ORDER — LACTATED RINGERS IV SOLN
INTRAVENOUS | Status: DC
  Administered 2014-12-12: 10:00:00 via INTRAVENOUS

## 2014-12-12 MED ORDER — BUPIVACAINE-EPINEPHRINE 0.25% -1:200000 IJ SOLN: INTRAMUSCULAR | Status: DC | PRN

## 2014-12-12 MED ORDER — LACTATED RINGERS IV SOLN
INTRAVENOUS | Status: DC | PRN
  Administered 2014-12-12 (×2): via INTRAVENOUS

## 2014-12-12 MED ORDER — PHENOL 1.4 % MT LIQD: 1.0000 | OROMUCOSAL | Status: DC | PRN

## 2014-12-12 MED ORDER — BACITRACIN ZINC 500 UNIT/GM EX OINT
TOPICAL_OINTMENT | CUTANEOUS | Status: AC
  Filled 2014-12-12: qty 28.35

## 2014-12-12 MED ORDER — FENTANYL CITRATE (PF) 100 MCG/2ML IJ SOLN: 25.0000 ug | INTRAMUSCULAR | Status: DC | PRN

## 2014-12-12 MED ORDER — BUPIVACAINE HCL (PF) 0.25 % IJ SOLN
INTRAMUSCULAR | Status: AC
  Filled 2014-12-12: qty 30

## 2014-12-12 MED ORDER — LIDOCAINE HCL (CARDIAC) 20 MG/ML IV SOLN
INTRAVENOUS | Status: AC
  Filled 2014-12-12: qty 5

## 2014-12-12 MED ORDER — SENNOSIDES-DOCUSATE SODIUM 8.6-50 MG PO TABS
1.0000 | ORAL_TABLET | Freq: Every evening | ORAL | Status: DC | PRN
Start: 2014-12-12 — End: 2014-12-13
  Filled 2014-12-12: qty 1

## 2014-12-12 MED ORDER — NEOSTIGMINE METHYLSULFATE 10 MG/10ML IV SOLN
INTRAVENOUS | Status: DC | PRN
  Administered 2014-12-12: 2 mg via INTRAVENOUS

## 2014-12-12 MED ORDER — FENTANYL CITRATE (PF) 250 MCG/5ML IJ SOLN
INTRAMUSCULAR | Status: AC
  Filled 2014-12-12: qty 5

## 2014-12-12 MED ORDER — ONDANSETRON HCL 4 MG/2ML IJ SOLN: 4.0000 mg | INTRAMUSCULAR | Status: DC | PRN

## 2014-12-12 MED ORDER — GUAIFENESIN-DM 100-10 MG/5ML PO SYRP
5.0000 mL | ORAL_SOLUTION | ORAL | Status: DC | PRN
  Filled 2014-12-12: qty 5

## 2014-12-12 MED ORDER — OXYCODONE HCL 5 MG/5ML PO SOLN: 5.0000 mg | Freq: Once | ORAL | Status: DC | PRN

## 2014-12-12 MED ORDER — ALUM & MAG HYDROXIDE-SIMETH 200-200-20 MG/5ML PO SUSP
30.0000 mL | Freq: Four times a day (QID) | ORAL | Status: DC | PRN
Start: 2014-12-12 — End: 2014-12-13

## 2014-12-12 MED ORDER — OXYCODONE-ACETAMINOPHEN 5-325 MG PO TABS
1.0000 | ORAL_TABLET | ORAL | Status: DC | PRN
  Administered 2014-12-13: 1 via ORAL
  Filled 2014-12-12 (×2): qty 1

## 2014-12-12 MED ORDER — SODIUM CHLORIDE 0.9 % IJ SOLN: 3.0000 mL | INTRAMUSCULAR | Status: DC | PRN

## 2014-12-12 MED ORDER — PHENYLEPHRINE HCL 10 MG/ML IJ SOLN
INTRAMUSCULAR | Status: DC | PRN
  Administered 2014-12-12: 40 ug via INTRAVENOUS

## 2014-12-12 MED ORDER — GLYCOPYRROLATE 0.2 MG/ML IJ SOLN
INTRAMUSCULAR | Status: DC | PRN
  Administered 2014-12-12: 0.2 mg via INTRAVENOUS

## 2014-12-12 MED ORDER — ONDANSETRON HCL 4 MG/2ML IJ SOLN
INTRAMUSCULAR | Status: DC | PRN
  Administered 2014-12-12: 4 mg via INTRAVENOUS

## 2014-12-12 MED ORDER — ONDANSETRON HCL 4 MG/2ML IJ SOLN
INTRAMUSCULAR | Status: AC
  Filled 2014-12-12: qty 2

## 2014-12-12 MED ORDER — MENTHOL 3 MG MT LOZG: 1.0000 | LOZENGE | OROMUCOSAL | Status: DC | PRN

## 2014-12-12 MED ORDER — FENTANYL CITRATE (PF) 100 MCG/2ML IJ SOLN
INTRAMUSCULAR | Status: DC | PRN
  Administered 2014-12-12: 25 ug via INTRAVENOUS
  Administered 2014-12-12 (×2): 75 ug via INTRAVENOUS

## 2014-12-12 MED ORDER — ACETAMINOPHEN 325 MG PO TABS
650.0000 mg | ORAL_TABLET | ORAL | Status: DC | PRN
  Administered 2014-12-12: 650 mg via ORAL
  Filled 2014-12-12: qty 2

## 2014-12-12 MED ORDER — FLEET ENEMA 7-19 GM/118ML RE ENEM
1.0000 | ENEMA | Freq: Once | RECTAL | Status: AC | PRN
  Filled 2014-12-12: qty 1

## 2014-12-12 MED ORDER — ONDANSETRON HCL 4 MG/2ML IJ SOLN: 4.0000 mg | Freq: Once | INTRAMUSCULAR | Status: DC | PRN

## 2014-12-12 MED ORDER — BISACODYL 5 MG PO TBEC
5.0000 mg | DELAYED_RELEASE_TABLET | Freq: Every day | ORAL | Status: DC | PRN
  Filled 2014-12-12: qty 1

## 2014-12-12 MED ORDER — BUPIVACAINE-EPINEPHRINE (PF) 0.25% -1:200000 IJ SOLN
INTRAMUSCULAR | Status: AC
  Filled 2014-12-12: qty 30

## 2014-12-12 MED ORDER — CEFAZOLIN SODIUM 1-5 GM-% IV SOLN
1.0000 g | Freq: Three times a day (TID) | INTRAVENOUS | Status: AC
Start: 2014-12-12 — End: 2014-12-13
  Administered 2014-12-12 – 2014-12-13 (×2): 1 g via INTRAVENOUS
  Filled 2014-12-12 (×2): qty 50

## 2014-12-12 MED ORDER — IOHEXOL 300 MG/ML  SOLN
INTRAMUSCULAR | Status: DC | PRN
  Administered 2014-12-12: 50 mL via INTRAVENOUS

## 2014-12-12 MED ORDER — PROPOFOL 10 MG/ML IV BOLUS
INTRAVENOUS | Status: AC
  Filled 2014-12-12: qty 20

## 2014-12-12 MED ORDER — VITAMIN D (ERGOCALCIFEROL) 1.25 MG (50000 UNIT) PO CAPS
50000.0000 [IU] | ORAL_CAPSULE | ORAL | Status: DC
  Filled 2014-12-12: qty 1

## 2014-12-12 MED ORDER — ROCURONIUM BROMIDE 100 MG/10ML IV SOLN
INTRAVENOUS | Status: DC | PRN
  Administered 2014-12-12: 25 mg via INTRAVENOUS

## 2014-12-12 MED ORDER — ARTIFICIAL TEARS OP OINT
TOPICAL_OINTMENT | OPHTHALMIC | Status: AC
  Filled 2014-12-12: qty 3.5

## 2014-12-12 MED ORDER — PNEUMOCOCCAL VAC POLYVALENT 25 MCG/0.5ML IJ INJ
0.5000 mL | INJECTION | INTRAMUSCULAR | Status: DC
  Filled 2014-12-12: qty 0.5

## 2014-12-12 MED ORDER — PROPOFOL 10 MG/ML IV BOLUS
INTRAVENOUS | Status: DC | PRN
  Administered 2014-12-12: 110 mg via INTRAVENOUS

## 2014-12-12 MED ORDER — ZOLPIDEM TARTRATE 5 MG PO TABS: 5.0000 mg | ORAL_TABLET | Freq: Every evening | ORAL | Status: DC | PRN

## 2014-12-12 MED ORDER — NEOSTIGMINE METHYLSULFATE 10 MG/10ML IV SOLN
INTRAVENOUS | Status: AC
  Filled 2014-12-12: qty 3

## 2014-12-12 SURGICAL SUPPLY — 49 items
ADH SKN CLS APL DERMABOND .7 (GAUZE/BANDAGES/DRESSINGS) ×1
BANDAGE ADH SHEER 1  50/CT (GAUZE/BANDAGES/DRESSINGS) ×6 IMPLANT
BLADE SURG 15 STRL LF DISP TIS (BLADE) ×1 IMPLANT
BLADE SURG 15 STRL SS (BLADE) ×3
BNDG ADH 5X2 AIR PERM ELC (GAUZE/BANDAGES/DRESSINGS) ×1
BNDG COHESIVE 2X5 WHT NS (GAUZE/BANDAGES/DRESSINGS) ×2 IMPLANT
CEMENT BONE KYPHX HV R (Orthopedic Implant) ×2 IMPLANT
CEMENT KYPHON C01A KIT/MIXER (Cement) ×2 IMPLANT
COVER MAYO STAND STRL (DRAPES) ×3 IMPLANT
COVER SURGICAL LIGHT HANDLE (MISCELLANEOUS) ×3 IMPLANT
CURETTE WEDGE 8.5MM KYPHX (MISCELLANEOUS) ×2 IMPLANT
DERMABOND ADVANCED (GAUZE/BANDAGES/DRESSINGS) ×2
DERMABOND ADVANCED .7 DNX12 (GAUZE/BANDAGES/DRESSINGS) IMPLANT
DRAPE C-ARM 42X72 X-RAY (DRAPES) ×3 IMPLANT
DRAPE INCISE IOBAN 66X45 STRL (DRAPES) ×3 IMPLANT
DRAPE LAPAROTOMY T 102X78X121 (DRAPES) ×3 IMPLANT
DRAPE SURG 17X23 STRL (DRAPES) ×12 IMPLANT
DURAPREP 26ML APPLICATOR (WOUND CARE) ×3 IMPLANT
GAUZE SPONGE 4X4 16PLY XRAY LF (GAUZE/BANDAGES/DRESSINGS) ×3 IMPLANT
GLOVE BIO SURGEON STRL SZ7 (GLOVE) ×3 IMPLANT
GLOVE BIO SURGEON STRL SZ8 (GLOVE) ×3 IMPLANT
GLOVE BIOGEL PI IND STRL 7.0 (GLOVE) ×1 IMPLANT
GLOVE BIOGEL PI IND STRL 8 (GLOVE) ×1 IMPLANT
GLOVE BIOGEL PI INDICATOR 7.0 (GLOVE) ×2
GLOVE BIOGEL PI INDICATOR 8 (GLOVE) ×2
GOWN STRL REUS W/ TWL LRG LVL3 (GOWN DISPOSABLE) ×2 IMPLANT
GOWN STRL REUS W/ TWL XL LVL3 (GOWN DISPOSABLE) ×1 IMPLANT
GOWN STRL REUS W/TWL LRG LVL3 (GOWN DISPOSABLE) ×6
GOWN STRL REUS W/TWL XL LVL3 (GOWN DISPOSABLE) ×3
KIT BASIN OR (CUSTOM PROCEDURE TRAY) ×3 IMPLANT
KIT ROOM TURNOVER OR (KITS) ×3 IMPLANT
MIXER KYPHON (MISCELLANEOUS) ×2 IMPLANT
NDL HYPO 25X1 1.5 SAFETY (NEEDLE) IMPLANT
NDL SPNL 18GX3.5 QUINCKE PK (NEEDLE) ×2 IMPLANT
NEEDLE 22X1 1/2 (OR ONLY) (NEEDLE) ×2 IMPLANT
NEEDLE HYPO 25X1 1.5 SAFETY (NEEDLE) ×3 IMPLANT
NEEDLE SPNL 18GX3.5 QUINCKE PK (NEEDLE) ×6 IMPLANT
NS IRRIG 1000ML POUR BTL (IV SOLUTION) ×3 IMPLANT
PACK SURGICAL SETUP 50X90 (CUSTOM PROCEDURE TRAY) ×3 IMPLANT
PAD ARMBOARD 7.5X6 YLW CONV (MISCELLANEOUS) ×6 IMPLANT
POSITIONER HEAD PRONE TRACH (MISCELLANEOUS) ×3 IMPLANT
SUT MNCRL AB 4-0 PS2 18 (SUTURE) ×3 IMPLANT
SYR 30ML BONE CEMENT XPNDR (MISCELLANEOUS) ×6
SYR BULB IRRIGATION 50ML (SYRINGE) ×3 IMPLANT
SYR CONTROL 10ML LL (SYRINGE) ×3 IMPLANT
SYRINGE 30ML BONE CEMENT XPNDR (MISCELLANEOUS) IMPLANT
TOWEL OR 17X24 6PK STRL BLUE (TOWEL DISPOSABLE) ×3 IMPLANT
TOWEL OR 17X26 10 PK STRL BLUE (TOWEL DISPOSABLE) ×3 IMPLANT
TRAY KYPHOPAK 15/3 ONESTEP 1ST (MISCELLANEOUS) ×2 IMPLANT

## 2014-12-12 NOTE — Anesthesia Preprocedure Evaluation (Signed)
Anesthesia Evaluation  Patient identified by MRN, date of birth, ID band Patient awake    Reviewed: Allergy & Precautions, NPO status , [name] Chart, lab work & pertinent test results  Airway Mallampati: II  TM Distance: >3 FB     Dental  (+) Poor Dentition, Dental Advisory Given   Pulmonary Current Smoker,  breath sounds clear to auscultation+ rhonchi         Cardiovascular Rhythm:Regular Rate:Normal     Neuro/Psych    GI/Hepatic   Endo/Other    Renal/GU      Musculoskeletal   Abdominal   Peds  Hematology   Anesthesia Other Findings   Reproductive/Obstetrics                             Anesthesia Physical Anesthesia Plan  ASA: III  Anesthesia Plan: General   Post-op Pain Management:    Induction: Intravenous  Airway Management Planned: Oral ETT  Additional Equipment:   Intra-op Plan:   Post-operative Plan: Extubation in OR  Informed Consent: I have reviewed the patients History and Physical, chart, labs and discussed the procedure including the risks, benefits and alternatives for the proposed anesthesia with the patient or authorized representative who has indicated his/her understanding and acceptance.   Dental advisory given  Plan Discussed with: CRNA and Anesthesiologist  Anesthesia Plan Comments: (T12 compression fracture COPD Possible cirrhosis INR, platelet count normal  Plan GA with oral ETT  Roberts Gaudy)        Anesthesia Quick Evaluation

## 2014-12-12 NOTE — H&P (Signed)
PREOPERATIVE H&P  Chief Complaint: mid back pain  HPI: Cassidy Barrett is a 68 y.o. female who presents with ongoing pain in the mid back x 2 months  MRI reveals subacute compression with edema noted at T12  Patient has failed multiple forms of conservative care and continues to have pain (see office notes for additional details regarding the patient's full course of treatment)  Past Medical History  Diagnosis Date  . Osteoporosis   . Alcohol abuse   . Cirrhosis   . Ascites   . Emphysema of lung   . Cholelithiasis   . Compression fracture of lumbar vertebra      L1 and L2  . Atherosclerosis of aorta   . Macrocytic anemia   . Complication of anesthesia     Difficult time waking up after having biospy with colonoscopy  . Pneumonia 04/2014  . Arthritis    Past Surgical History  Procedure Laterality Date  . Tonsillectomy    . Flexible sigmoidoscopy N/A 05/21/2014    Procedure: FLEXIBLE SIGMOIDOSCOPY;  Surgeon: Gatha Mayer, MD;  Location: [facility] ENDOSCOPY;  Service: Endoscopy;  Laterality: N/A;  . Colonoscopy N/A 05/24/2014    Procedure: COLONOSCOPY;  Surgeon: Gatha Mayer, MD;  Location: [facility] ENDOSCOPY;  Service: Endoscopy;  Laterality: N/A;  MAC if possible ultraslim colonoscope and gastroscope needed  . Mandible fracture surgery      X 2   History   Social History  . Marital Status: Divorced    Spouse Name: N/A  . Number of Children: N/A  . Years of Education: N/A   Social History Main Topics  . Smoking status: Current Every Day Smoker -- 0.25 packs/day for 46 years    Types: Cigarettes    Last Attempt to Quit: 05/14/2014  . Smokeless tobacco: Not on file  . Alcohol Use: 0.0 oz/week    0 Standard drinks or equivalent per week     Comment: occasional  . Drug Use: No  . Sexual Activity: Not on file   Other Topics Concern  . Not on file   Social History Narrative   No family history on file. Allergies  Allergen Reactions  . Tape     Skin sensitive  to tape  . Valium [Diazepam] Other (See Comments)    Pt states makes her crazy   Prior to Admission medications   Medication Sig Start Date End Date Taking? Authorizing Provider  acetaminophen (TYLENOL) 325 MG tablet Take 650 mg by mouth every 6 (six) hours as needed for mild pain or headache.   Yes Historical Provider, MD  cyclobenzaprine (FLEXERIL) 5 MG tablet Take 5 mg by mouth at bedtime as needed for muscle spasms.   Yes Historical Provider, MD  folic acid (FOLVITE) 1 MG tablet Take 1 tablet (1 mg total) by mouth daily. 05/25/14  Yes Robbie Lis, MD  guaiFENesin-dextromethorphan (ROBITUSSIN DM) 100-10 MG/5ML syrup Take 5 mLs by mouth every 4 (four) hours as needed for cough. 05/25/14  Yes Robbie Lis, MD  Multiple Vitamin (MULTIVITAMIN WITH MINERALS) TABS tablet Take 1 tablet by mouth daily. 05/25/14  Yes Robbie Lis, MD  Vitamin D, Ergocalciferol, (DRISDOL) 50000 UNITS CAPS capsule Take 50,000 Units by mouth every 7 (seven) days. Takes on wednesdays   Yes Historical Provider, MD  albuterol (PROVENTIL HFA;VENTOLIN HFA) 108 (90 BASE) MCG/ACT inhaler Inhale 2 puffs into the lungs every 4 (four) hours as needed for wheezing or shortness of breath. Patient not taking:  Reported on 12/06/2014 05/25/14   Robbie Lis, MD  ondansetron (ZOFRAN) 4 MG tablet Take 1 tablet (4 mg total) by mouth every 6 (six) hours as needed for nausea. Patient not taking: Reported on 12/06/2014 05/25/14   Robbie Lis, MD  potassium chloride SA (K-DUR,KLOR-CON) 20 MEQ tablet Take 1 tablet (20 mEq total) by mouth daily. Patient not taking: Reported on 12/06/2014 05/25/14   Robbie Lis, MD  thiamine 100 MG tablet Take 1 tablet (100 mg total) by mouth daily. Patient not taking: Reported on 12/06/2014 05/25/14   Robbie Lis, MD     All other systems have been reviewed and were otherwise negative with the exception of those mentioned in the HPI and as above.  Physical Exam: There were no vitals filed for this  visit.  General: Alert, no acute distress Cardiovascular: No pedal edema Respiratory: No cyanosis, no use of accessory musculature Skin: No lesions in the area of chief complaint Neurologic: Sensation intact distally Psychiatric: Patient is competent for consent with normal mood and affect Lymphatic: No axillary or cervical lymphadenopathy  MUSCULOSKELETAL: + TTP at mid-back  Assessment/Plan: T12 compression fracture Plan for Procedure(s): KYPHOPLASTY   Sinclair Ship, MD 12/12/2014 8:27 AM

## 2014-12-12 NOTE — Transfer of Care (Signed)
Immediate Anesthesia Transfer of Care Note  Patient: Cassidy Barrett  Procedure(s) Performed: Procedure(s) with comments: KYPHOPLASTY (N/A) - T10 kyphoplasty  Patient Location: PACU  Anesthesia Type:General  Level of Consciousness: awake, alert  and oriented  Airway & Oxygen Therapy: Patient Spontanous Breathing and Patient connected to nasal cannula oxygen  Post-op Assessment: Report given to RN, Post -op Vital signs reviewed and stable and Patient moving all extremities X 4  Post vital signs: Reviewed and stable  Last Vitals:  Filed Vitals:   12/12/14 0900  BP: 140/55  Pulse: 99  Temp: 36.6 C  Resp: 20    Complications: No apparent anesthesia complications

## 2014-12-12 NOTE — Anesthesia Postprocedure Evaluation (Signed)
  Anesthesia Post-op Note  Patient: Cassidy Barrett  Procedure(s) Performed: Procedure(s) with comments: KYPHOPLASTY (N/A) - T10 kyphoplasty  Patient Location: PACU  Anesthesia Type:General  Level of Consciousness: awake, alert  and oriented  Airway and Oxygen Therapy: Patient Spontanous Breathing and Patient connected to nasal cannula oxygen  Post-op Pain: mild  Post-op Assessment: Post-op Vital signs reviewed, [name] Cardiovascular Status Stable, Respiratory Function Stable, Patent Airway and Pain level controlled  Post-op Vital Signs: stable  Last Vitals:  Filed Vitals:   12/12/14 1542  BP: 117/46  Pulse: 74  Temp: 36.5 C  Resp: 16    Complications: No apparent anesthesia complications

## 2014-12-12 NOTE — Anesthesia Procedure Notes (Signed)
Procedure Name: Intubation Date/Time: 12/12/2014 1:05 PM Performed by: Garrison Columbus T Pre-anesthesia Checklist: Patient identified, Emergency Drugs available, Suction available and Patient being monitored Patient Re-evaluated:Patient Re-evaluated prior to inductionOxygen Delivery Method: Circle system utilized Preoxygenation: Pre-oxygenation with 100% oxygen Intubation Type: IV induction Ventilation: Mask ventilation without difficulty Laryngoscope Size: Miller and 2 Grade View: Grade I Tube type: Oral Tube size: 7.0 mm Number of attempts: 1 Airway Equipment and Method: Stylet Placement Confirmation: ETT inserted through vocal cords under direct vision,  positive ETCO2 and breath sounds checked- equal and bilateral Secured at: 20 cm Tube secured with: Tape Dental Injury: Teeth and Oropharynx as per pre-operative assessment

## 2014-12-13 ENCOUNTER — Encounter (HOSPITAL_COMMUNITY): Payer: Self-pay | Admitting: Orthopedic Surgery

## 2014-12-13 NOTE — Progress Notes (Signed)
    Patient doing well Minimal back pain Has been having left rib pain, tender Also reports having a skin tear at left forearm. This was never mentioned to me by operating room staff. This has been dressed with 4x4s and kurlex.   Physical Exam: Filed Vitals:   12/13/14 0415  BP: 98/51  Pulse: 90  Temp: 98.2 F (36.8 C)  Resp: 16    Dressing in place NVI + tenderness in left rib at approx T10 or so  POD #1 s/p T12 kyphoplasty, doing well  - encourage ambulation - Flexeril for muscle spasms (patient has at home), tylenol for pain - likely d/c home today - rib pain likely from postioning, will follow, skin tear treated appropriately, will follow

## 2014-12-13 NOTE — Op Note (Signed)
NAMEAVELYNN, SELLIN NO.:  [id number]  MEDICAL RECORD NO.:  [id number]  LOCATION:  [id number]                        FACILITY:  [facility]  PHYSICIAN:  Phylliss Bob, MD      DATE OF BIRTH:  [birth year]  DATE OF PROCEDURE:  12/12/2014                              OPERATIVE REPORT   PREOPERATIVE DIAGNOSIS:  T12 compression fracture, subacute.  POSTOPERATIVE DIAGNOSIS:  T12 compression fracture, subacute.  PROCEDURE:  T12 kyphoplasty.  SURGEON:  Phylliss Bob, MD  ASSISTANTS:  Pricilla Holm, PA-C.  ANESTHESIA:  General endotracheal anesthesia.  COMPLICATIONS:  None.  DISPOSITION:  Stable.  ESTIMATED BLOOD LOSS:  Minimal.  INDICATIONS FOR SURGERY:  Briefly, Ms. Safran is a very pleasant 69 year old female, who did present to me with substantial pain at the midaspect of her back.  Her pain was consistent with a compression fracture. Radiographs did reveal deformity of the T12 vertebral body, which did appear to be acute.  I did get the patient set up with an MRI, which did reveal the acuity of the fracture.  Given the patient's ongoing pain, we did discuss the pros and cons of proceeding with a T12 kyphoplasty procedure.  The patient did fully understand the risks and limitations of the procedure, and did elect to proceed.  OPERATIVE DETAILS:  On Dec 12, 2014, the patient was brought to Surgery and general endotracheal anesthesia was administered.  The patient was placed prone on a well-padded flat Jackson bed.  Gel rolls were placed under the patient's chest and hips.  Antibiotics were given and a time- out procedure was performed.  I then made two small incisions lateral to the T12 pedicles.  I then advanced Jamshidi needles across the T12 pedicles bilaterally into the vertebral bodies.  Once the cannulas were in their appropriate resting position, I did use a drill and a curette, which was advanced across the vertebral body.  I then inserted a kyphoplasty  balloons.  I was able to partially restore the height of the superior end-plate.  I then injected approximately 3 mL of bone cement through each cannula, for a total of approximately 6 mL of bone cement into the T12 vertebral body.  Of note, there was no abnormal extravasation of cement posteriorly into the spinal canal, or into the retroperitoneal space.  I was very pleased with the final AP and lateral fluoroscopic images.  The cement was then allowed to harden.  The cannulas were then removed.  The wound was then irrigated.  The wound was then closed with 3-0 Monocryl.  Bacitracin and a sterile dressing was applied.  The patient was then awakened from general endotracheal anesthesia and transferred to recovery in stable condition.     Phylliss Bob, MD     MD/MEDQ  D:  12/12/2014  T:  12/13/2014  Job:  [id number]

## 2014-12-13 NOTE — Progress Notes (Signed)
Patient alert and oriented, mae's well, voiding adequate amount of urine, swallowing without difficulty, no c/o pain. Patient discharged home with family. Script and discharged instructions given to patient. Patient and family stated understanding of d/c instructions given and has an appointment with MD. 

## 2014-12-14 DIAGNOSIS — M4854XA Collapsed vertebra, not elsewhere classified, thoracic region, initial encounter for fracture: Secondary | ICD-10-CM | POA: Diagnosis not present

## 2014-12-20 NOTE — Discharge Summary (Signed)
Patient ID: Cassidy Barrett MRN: [id number] DOB/AGE: 1946/02/17 69 y.o.  Admit date: 12/12/2014 Discharge date: 12/13/2014  Admission Diagnoses:  Active Problems:   Compression fracture   Discharge Diagnoses:  Same  Past Medical History  Diagnosis Date  . Osteoporosis   . Alcohol abuse   . Cirrhosis   . Ascites   . Emphysema of lung   . Cholelithiasis   . Compression fracture of lumbar vertebra      L1 and L2  . Atherosclerosis of aorta   . Macrocytic anemia   . Complication of anesthesia     Difficult time waking up after having biospy with colonoscopy  . Pneumonia 04/2014  . Arthritis     Surgeries: Procedure(s): KYPHOPLASTY T12 on 12/12/2014   Consultants:  None  Discharged Condition: Improved  Hospital Course: Cassidy Barrett is an 69 y.o. female who was admitted 12/12/2014 for operative treatment of compression fracture. Patient has severe unremitting pain that affects sleep, daily activities, and work/hobbies. After pre-op clearance the patient was taken to the operating room on 12/12/2014 and underwent  Procedure(s): KYPHOPLASTY T12.    Patient was given perioperative antibiotics:  Anti-infectives    Start     Dose/Rate Route Frequency Ordered Stop   12/12/14 1800  ceFAZolin (ANCEF) IVPB 1 g/50 mL premix     1 g 100 mL/hr over 30 Minutes Intravenous Every 8 hours 12/12/14 1537 12/13/14 0231   12/12/14 1100  ceFAZolin (ANCEF) IVPB 2 g/50 mL premix     2 g 100 mL/hr over 30 Minutes Intravenous To Surgery 12/11/14 1359 12/12/14 1315       Patient was given sequential compression devices, early ambulation to prevent DVT.  Patient benefited maximally from hospital stay and there were no complications.    Recent vital signs: BP 122/62 mmHg  Pulse 93  Temp(Src) 98.3 F (36.8 C) (Oral)  Resp 18  Wt 40.824 kg (90 lb)  SpO2 94%  Discharge Medications:     Medication List    STOP taking these medications        acetaminophen 325 MG tablet  Commonly  known as:  TYLENOL      TAKE these medications        albuterol 108 (90 BASE) MCG/ACT inhaler  Commonly known as:  PROVENTIL HFA;VENTOLIN HFA  Inhale 2 puffs into the lungs every 4 (four) hours as needed for wheezing or shortness of breath.     cyclobenzaprine 5 MG tablet  Commonly known as:  FLEXERIL  Take 5 mg by mouth at bedtime as needed for muscle spasms.     folic acid 1 MG tablet  Commonly known as:  FOLVITE  Take 1 tablet (1 mg total) by mouth daily.     guaiFENesin-dextromethorphan 100-10 MG/5ML syrup  Commonly known as:  ROBITUSSIN DM  Take 5 mLs by mouth every 4 (four) hours as needed for cough.     multivitamin with minerals Tabs tablet  Take 1 tablet by mouth daily.     ondansetron 4 MG tablet  Commonly known as:  ZOFRAN  Take 1 tablet (4 mg total) by mouth every 6 (six) hours as needed for nausea.     potassium chloride SA 20 MEQ tablet  Commonly known as:  K-DUR,KLOR-CON  Take 1 tablet (20 mEq total) by mouth daily.     thiamine 100 MG tablet  Take 1 tablet (100 mg total) by mouth daily.     Vitamin D (Ergocalciferol) 50000 UNITS Caps  capsule  Commonly known as:  DRISDOL  Take 50,000 Units by mouth every 7 (seven) days. Takes on wednesdays        Diagnostic Studies: Dg Chest 2 View  12/11/2014   CLINICAL DATA:  Pre kyphoplasty evaluation for a T12 fracture. Smoker. Emphysema. Cirrhosis.  EXAM: CHEST  2 VIEW  COMPARISON:  06/25/2014.  Thoracic spine MR dated 11/29/2014.  FINDINGS: Normal sized heart. Clear lungs. The lungs remain hyperexpanded. Since the MR, there has been no significant change in the previously described T12 and L1 vertebral compression deformities. No new fractures are seen. Acute kyphosis at the T12 level is again demonstrated. Thoracic spine degenerative changes. Diffuse osteopenia.  IMPRESSION: 1. No acute abnormality. 2. Stable changes of COPD. 3. Previously described T12 and L1 compression deformities with acute kyphosis.    Electronically Signed   By: Claudie Revering M.D.   On: 12/11/2014 16:38   Dg Lumbar Spine 2-3 Views  12/12/2014   CLINICAL DATA:  T12 kyphoplasty  EXAM: DG C-ARM 61-120 MIN; LUMBAR SPINE - 2-3 VIEW  TECHNIQUE: Two digital C-arm fluoroscopic images obtained intraoperatively are submitted.  CONTRAST:  None utilized  FLUOROSCOPY TIME:  Radiation Exposure Index (as provided by the fluoroscopic device): Not provided  If the device does not provide the exposure index:  Fluoroscopy Time (in minutes and seconds):  1 minutes 37 seconds  Number of Acquired Images:  2  COMPARISON:  MRI thoracic spine 11/29/2014  FINDINGS: Cement newly identified at marked compression fracture of the T12 vertebra.  Persistent significant height loss of T12 vertebra is seen.  Superior endplate compression fracture L1 unchanged.  Diffuse osseous demineralization.  IMPRESSION: Interval spinal augmentation procedure of T12 with persistent anterior height losses at T12 and L1 vertebra.   Electronically Signed   By: Lavonia Dana M.D.   On: 12/12/2014 16:14   Mr Thoracic Spine Wo Contrast  11/30/2014   CLINICAL DATA:  Compression fracture. Felt acute back pain while routinely walking in the house 2 months ago. Pain throughout the thoracic and lumbar region.  EXAM: MRI THORACIC AND LUMBAR SPINE WITHOUT CONTRAST  TECHNIQUE: Multiplanar and multiecho pulse sequences of the thoracic and lumbar spine were obtained without intravenous contrast.  COMPARISON:  CT chest 05/14/2014  FINDINGS: MR THORACIC SPINE FINDINGS  Scout view in the cervical region does not show a fracture. There is spondylosis, not evaluated in detail.  In the thoracic spine, there is no fracture at T11 or above. There is mild spinal curvature in there is ordinary mild disc degeneration with a few minimal disc bulges but no herniation or any stenosis of the canal or foramina. There is a late subacute compression fracture at T12 that was not present on the CT of last October. There is  loss of height anteriorly of 60%. There is posterior bowing of the posterior superior margin of the vertebral body by 5 mm. This indents the thecal sac and deviates the cord slightly. Ample subarachnoid spaces present dorsal to the cord. There is mild residual edema in the healing vertebral body.  MR LUMBAR SPINE FINDINGS  At L1, there is an old healed compression fracture with loss of height anteriorly of 50%. No retropulsed bone. No stenosis. No fracture seen below that in the lumbar region.  L1-2:  Mild bulging of the disc.  No stenosis.  L2-3: Retrolisthesis of 2 mm. Desiccation and bulging of the disc. No stenosis.  L3-4: Minimal disc bulge.  No stenosis.  L4-5: Minimal disc bulge.  No stenosis.  L5-S1: Minimal disc bulge.  No stenosis.  No sacral fracture.  IMPRESSION: MR THORACIC SPINE IMPRESSION  Late subacute compression fracture at T12 which was not present in October of last year. Loss of height anteriorly of 60%. Retropulsed bone of 5 mm encroaches upon the spinal canal but does not appear to frankly compress the cord, although the cord does deviate posteriorly slightly. Healing is not yet complete, as there is mild residual marrow edema. There is no finding to suggest that this is anything other than a benign osteoporotic fracture.  MR LUMBAR SPINE IMPRESSION  Old healed compression deformity at L1 without canal compromise. No other fracture in the lumbar region. Mild non-compressive degenerative changes below that.   Electronically Signed   By: Nelson Chimes M.D.   On: 11/30/2014 08:31   Mr Lumbar Spine Wo Contrast  11/30/2014   CLINICAL DATA:  Compression fracture. Felt acute back pain while routinely walking in the house 2 months ago. Pain throughout the thoracic and lumbar region.  EXAM: MRI THORACIC AND LUMBAR SPINE WITHOUT CONTRAST  TECHNIQUE: Multiplanar and multiecho pulse sequences of the thoracic and lumbar spine were obtained without intravenous contrast.  COMPARISON:  CT chest 05/14/2014   FINDINGS: MR THORACIC SPINE FINDINGS  Scout view in the cervical region does not show a fracture. There is spondylosis, not evaluated in detail.  In the thoracic spine, there is no fracture at T11 or above. There is mild spinal curvature in there is ordinary mild disc degeneration with a few minimal disc bulges but no herniation or any stenosis of the canal or foramina. There is a late subacute compression fracture at T12 that was not present on the CT of last October. There is loss of height anteriorly of 60%. There is posterior bowing of the posterior superior margin of the vertebral body by 5 mm. This indents the thecal sac and deviates the cord slightly. Ample subarachnoid spaces present dorsal to the cord. There is mild residual edema in the healing vertebral body.  MR LUMBAR SPINE FINDINGS  At L1, there is an old healed compression fracture with loss of height anteriorly of 50%. No retropulsed bone. No stenosis. No fracture seen below that in the lumbar region.  L1-2:  Mild bulging of the disc.  No stenosis.  L2-3: Retrolisthesis of 2 mm. Desiccation and bulging of the disc. No stenosis.  L3-4: Minimal disc bulge.  No stenosis.  L4-5: Minimal disc bulge.  No stenosis.  L5-S1: Minimal disc bulge.  No stenosis.  No sacral fracture.  IMPRESSION: MR THORACIC SPINE IMPRESSION  Late subacute compression fracture at T12 which was not present in October of last year. Loss of height anteriorly of 60%. Retropulsed bone of 5 mm encroaches upon the spinal canal but does not appear to frankly compress the cord, although the cord does deviate posteriorly slightly. Healing is not yet complete, as there is mild residual marrow edema. There is no finding to suggest that this is anything other than a benign osteoporotic fracture.  MR LUMBAR SPINE IMPRESSION  Old healed compression deformity at L1 without canal compromise. No other fracture in the lumbar region. Mild non-compressive degenerative changes below that.    Electronically Signed   By: Nelson Chimes M.D.   On: 11/30/2014 08:31   Dg C-arm 1-60 Min  12/12/2014   CLINICAL DATA:  T12 kyphoplasty  EXAM: DG C-ARM 61-120 MIN; LUMBAR SPINE - 2-3 VIEW  TECHNIQUE: Two digital C-arm fluoroscopic images obtained intraoperatively are  submitted.  CONTRAST:  None utilized  FLUOROSCOPY TIME:  Radiation Exposure Index (as provided by the fluoroscopic device): Not provided  If the device does not provide the exposure index:  Fluoroscopy Time (in minutes and seconds):  1 minutes 37 seconds  Number of Acquired Images:  2  COMPARISON:  MRI thoracic spine 11/29/2014  FINDINGS: Cement newly identified at marked compression fracture of the T12 vertebra.  Persistent significant height loss of T12 vertebra is seen.  Superior endplate compression fracture L1 unchanged.  Diffuse osseous demineralization.  IMPRESSION: Interval spinal augmentation procedure of T12 with persistent anterior height losses at T12 and L1 vertebra.   Electronically Signed   By: Lavonia Dana M.D.   On: 12/12/2014 16:14    Disposition: 01-Home or Self Care   POD #1 s/p T12 kyphoplasty, doing well  - encourage ambulation - Flexeril for muscle spasms (patient has at home), tylenol for pain - likely d/c home today - rib pain likely from postioning, will follow, skin tear treated appropriately, will follow -D/C instructions sheet printed and in chart -D/C today  -F/U in office 2 weeks   Signed: Justice Britain 12/20/2014, 9:49 AM

## 2014-12-25 DIAGNOSIS — Z9889 Other specified postprocedural states: Secondary | ICD-10-CM | POA: Diagnosis not present

## 2014-12-25 DIAGNOSIS — M4854XD Collapsed vertebra, not elsewhere classified, thoracic region, subsequent encounter for fracture with routine healing: Secondary | ICD-10-CM | POA: Diagnosis not present

## 2014-12-26 ENCOUNTER — Other Ambulatory Visit: Payer: Self-pay | Admitting: Orthopedic Surgery

## 2014-12-26 DIAGNOSIS — M542 Cervicalgia: Secondary | ICD-10-CM

## 2015-01-01 DIAGNOSIS — M546 Pain in thoracic spine: Secondary | ICD-10-CM | POA: Diagnosis not present

## 2015-01-01 DIAGNOSIS — M545 Low back pain: Secondary | ICD-10-CM | POA: Diagnosis not present

## 2015-01-07 DIAGNOSIS — M545 Low back pain: Secondary | ICD-10-CM | POA: Diagnosis not present

## 2015-01-07 DIAGNOSIS — M546 Pain in thoracic spine: Secondary | ICD-10-CM | POA: Diagnosis not present

## 2015-01-11 DIAGNOSIS — M545 Low back pain: Secondary | ICD-10-CM | POA: Diagnosis not present

## 2015-01-11 DIAGNOSIS — M546 Pain in thoracic spine: Secondary | ICD-10-CM | POA: Diagnosis not present

## 2015-01-14 DIAGNOSIS — M546 Pain in thoracic spine: Secondary | ICD-10-CM | POA: Diagnosis not present

## 2015-01-14 DIAGNOSIS — M545 Low back pain: Secondary | ICD-10-CM | POA: Diagnosis not present

## 2015-01-18 DIAGNOSIS — M546 Pain in thoracic spine: Secondary | ICD-10-CM | POA: Diagnosis not present

## 2015-01-18 DIAGNOSIS — M545 Low back pain: Secondary | ICD-10-CM | POA: Diagnosis not present

## 2015-01-21 DIAGNOSIS — M545 Low back pain: Secondary | ICD-10-CM | POA: Diagnosis not present

## 2015-01-21 DIAGNOSIS — M546 Pain in thoracic spine: Secondary | ICD-10-CM | POA: Diagnosis not present

## 2015-01-22 DIAGNOSIS — M545 Low back pain: Secondary | ICD-10-CM | POA: Diagnosis not present

## 2015-01-22 DIAGNOSIS — M546 Pain in thoracic spine: Secondary | ICD-10-CM | POA: Diagnosis not present

## 2015-01-30 DIAGNOSIS — M545 Low back pain: Secondary | ICD-10-CM | POA: Diagnosis not present

## 2015-01-30 DIAGNOSIS — M546 Pain in thoracic spine: Secondary | ICD-10-CM | POA: Diagnosis not present

## 2015-02-01 DIAGNOSIS — M545 Low back pain: Secondary | ICD-10-CM | POA: Diagnosis not present

## 2015-02-01 DIAGNOSIS — M546 Pain in thoracic spine: Secondary | ICD-10-CM | POA: Diagnosis not present

## 2015-02-04 DIAGNOSIS — M545 Low back pain: Secondary | ICD-10-CM | POA: Diagnosis not present

## 2015-02-04 DIAGNOSIS — M546 Pain in thoracic spine: Secondary | ICD-10-CM | POA: Diagnosis not present

## 2015-02-08 DIAGNOSIS — M546 Pain in thoracic spine: Secondary | ICD-10-CM | POA: Diagnosis not present

## 2015-02-08 DIAGNOSIS — M545 Low back pain: Secondary | ICD-10-CM | POA: Diagnosis not present

## 2015-05-17 DIAGNOSIS — S32040A Wedge compression fracture of fourth lumbar vertebra, initial encounter for closed fracture: Secondary | ICD-10-CM | POA: Diagnosis not present

## 2015-05-18 ENCOUNTER — Other Ambulatory Visit: Payer: Self-pay | Admitting: Orthopedic Surgery

## 2015-05-18 DIAGNOSIS — M545 Low back pain: Secondary | ICD-10-CM

## 2015-05-27 ENCOUNTER — Ambulatory Visit
Admission: RE | Admit: 2015-05-27 | Discharge: 2015-05-27 | Disposition: A | Discharge status: Home / Self Care | Payer: Medicare Other | Source: Ambulatory Visit | Attending: Orthopedic Surgery | Admitting: Orthopedic Surgery

## 2015-05-27 DIAGNOSIS — M5126 Other intervertebral disc displacement, lumbar region: Secondary | ICD-10-CM | POA: Diagnosis not present

## 2015-05-27 DIAGNOSIS — S32040A Wedge compression fracture of fourth lumbar vertebra, initial encounter for closed fracture: Secondary | ICD-10-CM | POA: Diagnosis not present

## 2015-05-27 DIAGNOSIS — M545 Low back pain: Secondary | ICD-10-CM

## 2015-05-31 DIAGNOSIS — S32040A Wedge compression fracture of fourth lumbar vertebra, initial encounter for closed fracture: Secondary | ICD-10-CM | POA: Diagnosis not present

## 2015-05-31 DIAGNOSIS — S32020A Wedge compression fracture of second lumbar vertebra, initial encounter for closed fracture: Secondary | ICD-10-CM | POA: Diagnosis not present

## 2015-05-31 DIAGNOSIS — M545 Low back pain: Secondary | ICD-10-CM | POA: Diagnosis not present

## 2015-07-30 DIAGNOSIS — Z780 Asymptomatic menopausal state: Secondary | ICD-10-CM | POA: Diagnosis not present

## 2015-07-30 DIAGNOSIS — M810 Age-related osteoporosis without current pathological fracture: Secondary | ICD-10-CM | POA: Diagnosis not present

## 2015-08-22 DIAGNOSIS — M810 Age-related osteoporosis without current pathological fracture: Secondary | ICD-10-CM | POA: Diagnosis not present

## 2015-08-22 DIAGNOSIS — R5383 Other fatigue: Secondary | ICD-10-CM | POA: Diagnosis not present

## 2015-08-22 DIAGNOSIS — E559 Vitamin D deficiency, unspecified: Secondary | ICD-10-CM | POA: Diagnosis not present

## 2015-09-12 ENCOUNTER — Other Ambulatory Visit (HOSPITAL_COMMUNITY): Payer: Self-pay | Admitting: Sports Medicine

## 2015-09-12 DIAGNOSIS — M2550 Pain in unspecified joint: Secondary | ICD-10-CM | POA: Diagnosis not present

## 2015-09-12 DIAGNOSIS — M810 Age-related osteoporosis without current pathological fracture: Secondary | ICD-10-CM | POA: Diagnosis not present

## 2015-09-12 DIAGNOSIS — R5383 Other fatigue: Secondary | ICD-10-CM | POA: Diagnosis not present

## 2015-09-12 DIAGNOSIS — T148XXA Other injury of unspecified body region, initial encounter: Secondary | ICD-10-CM

## 2015-09-18 ENCOUNTER — Encounter (HOSPITAL_COMMUNITY): Payer: Medicare Other

## 2015-09-23 ENCOUNTER — Telehealth: Payer: Self-pay | Admitting: Hematology

## 2015-09-23 NOTE — Telephone Encounter (Signed)
Pt aware of np appt.09/30/15@10 :100 Referring Dr. Layne Benton Dx-elevated sed rate ,total protein and abnormal spep Pt medical records scanned in media tab.  Disregard pgs 25-28 another pt records in with this pt and has been reported.

## 2015-09-26 ENCOUNTER — Ambulatory Visit (HOSPITAL_COMMUNITY)
Admission: RE | Admit: 2015-09-26 | Discharge: 2015-09-26 | Disposition: A | Discharge status: Home / Self Care | Payer: Medicare Other | Source: Ambulatory Visit | Attending: Sports Medicine | Admitting: Sports Medicine

## 2015-09-26 ENCOUNTER — Encounter (HOSPITAL_COMMUNITY): Admission: RE | Admit: 2015-09-26 | Payer: Medicare Other | Source: Ambulatory Visit

## 2015-09-26 DIAGNOSIS — M25552 Pain in left hip: Secondary | ICD-10-CM | POA: Diagnosis not present

## 2015-09-26 DIAGNOSIS — T148XXA Other injury of unspecified body region, initial encounter: Secondary | ICD-10-CM

## 2015-09-26 DIAGNOSIS — M4856XA Collapsed vertebra, not elsewhere classified, lumbar region, initial encounter for fracture: Secondary | ICD-10-CM | POA: Diagnosis not present

## 2015-09-26 DIAGNOSIS — R938 Abnormal findings on diagnostic imaging of other specified body structures: Secondary | ICD-10-CM | POA: Insufficient documentation

## 2015-09-26 DIAGNOSIS — M898X9 Other specified disorders of bone, unspecified site: Secondary | ICD-10-CM | POA: Insufficient documentation

## 2015-09-26 DIAGNOSIS — M25551 Pain in right hip: Secondary | ICD-10-CM | POA: Diagnosis not present

## 2015-09-26 MED ORDER — TECHNETIUM TC 99M MEDRONATE IV KIT
25.4000 | PACK | Freq: Once | INTRAVENOUS | Status: AC | PRN
  Administered 2015-09-26: 25.4 via INTRAVENOUS

## 2015-09-30 ENCOUNTER — Telehealth: Payer: Self-pay | Admitting: Hematology

## 2015-09-30 ENCOUNTER — Ambulatory Visit (HOSPITAL_BASED_OUTPATIENT_CLINIC_OR_DEPARTMENT_OTHER): Payer: Medicare Other

## 2015-09-30 ENCOUNTER — Ambulatory Visit (HOSPITAL_BASED_OUTPATIENT_CLINIC_OR_DEPARTMENT_OTHER): Payer: Medicare Other | Admitting: Hematology

## 2015-09-30 ENCOUNTER — Encounter: Payer: Self-pay | Admitting: Hematology

## 2015-09-30 VITALS — BP 120/58 | HR 125 | Temp 98.3°F | Resp 18 | Wt 87.3 lb

## 2015-09-30 DIAGNOSIS — D472 Monoclonal gammopathy: Secondary | ICD-10-CM

## 2015-09-30 DIAGNOSIS — E8809 Other disorders of plasma-protein metabolism, not elsewhere classified: Secondary | ICD-10-CM

## 2015-09-30 DIAGNOSIS — K7460 Unspecified cirrhosis of liver: Secondary | ICD-10-CM | POA: Diagnosis not present

## 2015-09-30 DIAGNOSIS — R198 Other specified symptoms and signs involving the digestive system and abdomen: Secondary | ICD-10-CM

## 2015-09-30 DIAGNOSIS — K6289 Other specified diseases of anus and rectum: Secondary | ICD-10-CM | POA: Insufficient documentation

## 2015-09-30 LAB — CBC & DIFF AND RETIC
BASO%: 0.4 % (ref 0.0–2.0)
Basophils Absolute: 0 10*3/uL (ref 0.0–0.1)
EOS%: 1.2 % (ref 0.0–7.0)
Eosinophils Absolute: 0.1 10*3/uL (ref 0.0–0.5)
HEMATOCRIT: 39.5 % (ref 34.8–46.6)
HGB: 13.8 g/dL (ref 11.6–15.9)
Immature Retic Fract: 2.9 % (ref 1.60–10.00)
LYMPH#: 0.8 10*3/uL — AB (ref 0.9–3.3)
LYMPH%: 11.3 % — AB (ref 14.0–49.7)
MCH: 35.3 pg — AB (ref 25.1–34.0)
MCHC: 34.9 g/dL (ref 31.5–36.0)
MCV: 101 fL (ref 79.5–101.0)
MONO#: 0.7 10*3/uL (ref 0.1–0.9)
MONO%: 10.3 % (ref 0.0–14.0)
NEUT%: 76.8 % (ref 38.4–76.8)
NEUTROS ABS: 5.2 10*3/uL (ref 1.5–6.5)
PLATELETS: 189 10*3/uL (ref 145–400)
RBC: 3.91 10*6/uL (ref 3.70–5.45)
RDW: 15.4 % — ABNORMAL HIGH (ref 11.2–14.5)
Retic %: 2.36 % — ABNORMAL HIGH (ref 0.70–2.10)
Retic Ct Abs: 92.28 10*3/uL — ABNORMAL HIGH (ref 33.70–90.70)
WBC: 6.7 10*3/uL (ref 3.9–10.3)
nRBC: 0 % (ref 0–0)

## 2015-09-30 LAB — COMPREHENSIVE METABOLIC PANEL
ALT: 21 U/L (ref 0–55)
ANION GAP: 12 meq/L — AB (ref 3–11)
AST: 71 U/L — ABNORMAL HIGH (ref 5–34)
Albumin: 2.8 g/dL — ABNORMAL LOW (ref 3.5–5.0)
Alkaline Phosphatase: 606 U/L — ABNORMAL HIGH (ref 40–150)
BUN: 5.1 mg/dL — ABNORMAL LOW (ref 7.0–26.0)
CALCIUM: 9.2 mg/dL (ref 8.4–10.4)
CHLORIDE: 95 meq/L — AB (ref 98–109)
CO2: 26 meq/L (ref 22–29)
Creatinine: 0.6 mg/dL (ref 0.6–1.1)
Glucose: 107 mg/dl (ref 70–140)
POTASSIUM: 3.9 meq/L (ref 3.5–5.1)
Sodium: 132 mEq/L — ABNORMAL LOW (ref 136–145)
Total Bilirubin: 1.27 mg/dL — ABNORMAL HIGH (ref 0.20–1.20)
Total Protein: 8.9 g/dL — ABNORMAL HIGH (ref 6.4–8.3)

## 2015-09-30 LAB — LACTATE DEHYDROGENASE: LDH: 122 U/L — AB (ref 125–245)

## 2015-09-30 NOTE — Progress Notes (Signed)
Marland Kitchen    HEMATOLOGY/ONCOLOGY CONSULTATION NOTE  Date of Service: 09/30/2015  Patient Care Team: Everardo Beals, NP as PCP - General  Orthopedics - Dr. Layne Benton from [facility]   CHIEF COMPLAINTS/PURPOSE OF CONSULTATION:  Elevated M protein and vertebral compression fracture. Elevated sedimentation rate and total protein.  HISTORY OF PRESENTING ILLNESS:   Cassidy Barrett is a wonderful 70 y.o. female who has been referred to [place] by Dr Wandra Feinstein for evaluation and management of possible plasma cell dyscrasia.  Patient has a history of liver cirrhosis with ascites, alcohol abuse, emphysema, macrocytic anemia, osteoporosis and was seen by orthopedics for spinal vertebral compression fractures in the setting of osteoporosis.  She was noted to have multiple significant lab abnormalities including an elevated alkaline phosphatase and high total protein level. 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 Noted to have a 3-4 cm soft tissue mass of the rectum partially narrowing the lumen about 6 cm upstream of the anal verge. No distant metastatic disease noted. Chronic appealing L1-L2 compression fractures  Patient subsequently had a bone scan to evaluate these compression fractures on 09/26/2015.  This showed mild increased activity within the superior endplates of L3, L4 and L5 likely representing subacute fractures. Increased activity within the T2 vertebral body compatible with known compression fracture. No evidence of suggest bony metastatic disease.  As a part of her workup she also had an SPEP done which shows 3 monoclonal protein bands for a total M protein of 2.3 g/dL. On IFE this is noted to be monoclonal IgA kappa protein. Her sedimentation rate was also elevated at 73.  She was referred to [place] for  further evaluation to rule out multiple myeloma. Patient notes her mid and lower back pain related to her compression fractures. Also notes some left hip pain. Her labs from January 2017 did not show any anemia, hypercalcemia or renal failure.  She notes that she did have a colonoscopy in 2015 which demonstrated a 4.5 cm rectal polyp which was removed piecemeal. Pathology showed a tubulovillous adenoma. Patient was also noted to have significant diverticulosis and was not recommended routine colonoscopies. Given her CT scan showing concern of rectal mass she was referred back to Dr. Henrene Pastor at [facility] for consideration of a sigmoidoscopy/colonoscopy. Patient notes no overt rectal bleeding. No obstructive symptoms. No change in bowel habits. No new abdominal pain. She also needs to connect with gastric oncology for management of her liver cirrhosis and related issues.    MEDICAL HISTORY:  Past Medical History  Diagnosis Date  . Osteoporosis   . Alcohol abuse   . Cirrhosis   . Ascites   . Emphysema of lung   . Cholelithiasis   . Compression fracture of lumbar vertebra      L1 and L2  . Atherosclerosis of aorta   . Macrocytic anemia   . Complication of anesthesia     Difficult time waking up after having biospy with colonoscopy  . Pneumonia 04/2014  . 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  SURGICAL HISTORY: Past Surgical History  Procedure Laterality Date  . Tonsillectomy    . Flexible sigmoidoscopy N/A 05/21/2014    Procedure: FLEXIBLE SIGMOIDOSCOPY;  Surgeon: Gatha Mayer, MD;  Location: [facility];  Service: Endoscopy;  Laterality: N/A;  . Colonoscopy N/A  05/24/2014    Procedure: COLONOSCOPY;  Surgeon: Gatha Mayer, MD;  Location: [facility];  Service: Endoscopy;  Laterality: N/A;  MAC if possible ultraslim colonoscope and gastroscope needed  . Mandible fracture surgery      X 2  . Kyphoplasty N/A 12/12/2014    Procedure:  KYPHOPLASTY;  Surgeon: Phylliss Bob, MD;  Location: [facility];  Service: Orthopedics;  Laterality: N/A;  T10 kyphoplasty    SOCIAL HISTORY: Social History   Social History  . Marital Status: Divorced    Spouse Name: N/A  . Number of Children: N/A  . Years of Education: N/A   Occupational History  . Not on file.   Social History Main Topics  . Smoking status: Current Every Day Smoker -- 0.25 packs/day for 46 years    Types: Cigarettes    Last Attempt to Quit: 05/14/2014  . Smokeless tobacco: Never Used  . Alcohol Use: 0.0 oz/week    0 Standard drinks or equivalent per week     Comment: occasional  . Drug Use: No  . Sexual Activity: Not on file   Other Topics Concern  . Not on file   Social History Narrative   Patient notes that she is drinking about 1 beer and 1 hard liquor every day - was counseled regarding absolute alcohol abstinence.  FAMILY HISTORY:  No family history of colon cancer.  ALLERGIES:  is allergic to tape and valium.  MEDICATIONS:  Current Outpatient Prescriptions  Medication Sig Dispense Refill  . albuterol (PROVENTIL HFA;VENTOLIN HFA) 108 (90 BASE) MCG/ACT inhaler Inhale 2 puffs into the lungs every 4 (four) hours as needed for wheezing or shortness of breath. (Patient not taking: Reported on 12/06/2014) 1 Inhaler 0  . cyclobenzaprine (FLEXERIL) 5 MG tablet Take 5 mg by mouth at bedtime as needed for muscle spasms.    . folic acid (FOLVITE) 1 MG tablet Take 1 tablet (1 mg total) by mouth daily. 30 tablet 0  . guaiFENesin-dextromethorphan (ROBITUSSIN DM) 100-10 MG/5ML syrup Take 5 mLs by mouth every 4 (four) hours as needed for cough. 118 mL 0  . Multiple Vitamin (MULTIVITAMIN WITH MINERALS) TABS tablet Take 1 tablet by mouth daily. 30 tablet 0  . ondansetron (ZOFRAN) 4 MG tablet Take 1 tablet (4 mg total) by mouth every 6 (six) hours as needed for nausea. (Patient not taking: Reported on 12/06/2014) 20 tablet 0  . potassium chloride SA (K-DUR,KLOR-CON) 20  MEQ tablet Take 1 tablet (20 mEq total) by mouth daily. (Patient not taking: Reported on 12/06/2014) 30 tablet 0  . thiamine 100 MG tablet Take 1 tablet (100 mg total) by mouth daily. (Patient not taking: Reported on 12/06/2014) 30 tablet 0  . Vitamin D, Ergocalciferol, (DRISDOL) 50000 UNITS CAPS capsule Take 50,000 Units by mouth every 7 (seven) days. Takes on wednesdays     No current facility-administered medications for this visit.    REVIEW OF SYSTEMS:    10 Point review of Systems was done is negative except as noted above.  PHYSICAL EXAMINATION: ECOG PERFORMANCE STATUS: 2 - Symptomatic, <50% confined to bed  . Filed Vitals:   09/30/15 1033  BP: 120/58  Pulse: 125  Temp: 98.3 F (36.8 C)  Resp: 18   Filed Weights   09/30/15 1033  Weight: 87 lb 4.8 oz (39.599 kg)   .Body mass index is 17.05 kg/(m^2).  GENERAL:alert, in no acute distress and comfortable SKIN:No acute rashes  EYES: normal, conjunctiva are pink and non-injected, sclera clear OROPHARYNX:no exudate,  no erythema and lips, buccal mucosa, and tongue normal  NECK: supple, no JVD, thyroid normal size, non-tender, without nodularity LYMPH:  no palpable lymphadenopathy in the cervical, axillary or inguinal LUNGS: clear to auscultation with normal respiratory effort HEART: regular rate & rhythm,  no murmurs and no lower extremity edema ABDOMEN: abdomen soft, non-tender, normoactive bowel sounds , no palpable hepatosplenomegaly Musculoskeletal: No pedal edema PSYCH: alert & oriented x 3 with fluent speech NEURO: no focal motor/sensory deficits  LABORATORY DATA:  I have reviewed the data as listed  . CBC Latest Ref Rng 12/11/2014 05/23/2014 05/22/2014  WBC 4.0 - 10.5 K/uL 7.6 13.0(H) 14.0(H)  Hemoglobin 12.0 - 15.0 g/dL 13.2 8.6(L) 8.4(L)  Hematocrit 36.0 - 46.0 % 38.6 25.2(L) 24.3(L)  Platelets 150 - 400 K/uL 205 250 229    . CMP Latest Ref Rng 12/11/2014 05/25/2014 05/24/2014  Glucose 65 - 99 mg/dL 105(H)  74 87  BUN 6 - 20 mg/dL 8 4(L) 5(L)  Creatinine 0.44 - 1.00 mg/dL 0.56 0.43(L) 0.37(L)  Sodium 135 - 145 mmol/L 133(L) 137 136(L)  Potassium 3.5 - 5.1 mmol/L 4.1 3.9 4.2  Chloride 101 - 111 mmol/L 97(L) 107 104  CO2 22 - 32 mmol/L '26 22 24  '$ Calcium 8.9 - 10.3 mg/dL 9.7 7.1(L) 8.1(L)  Total Protein 6.5 - 8.1 g/dL 8.6(H) - -  Total Bilirubin 0.3 - 1.2 mg/dL 0.5 - -  Alkaline Phos 38 - 126 U/L 346(H) - -  AST 15 - 41 U/L 44(H) - -  ALT 14 - 54 U/L 26 - -     RADIOGRAPHIC STUDIES: I have personally reviewed the radiological images as listed and agreed with the findings in the report. Nm Bone Scan Whole Body  09/26/2015  CLINICAL DATA:  70 year old female with chronic back pain for 2 years. Bilateral hip pain for 1-1/2 weeks. No history of trauma. EXAM: NUCLEAR MEDICINE WHOLE BODY BONE SCAN TECHNIQUE: Whole body anterior and posterior images were obtained approximately 3 hours after intravenous injection of radiopharmaceutical. RADIOPHARMACEUTICALS:  24.4 mCi Technetium-86mMDP IV COMPARISON:  05/27/2015 lumbar spine MR and prior studies. FINDINGS: Mild increased activity within the superior endplates of L3, L4 and L5 likely representing subacute fractures. Increased activity within the T12 vertebral body likely represents known compression fractures/ vertebral augmentation changes Degenerative changes in the lateral left knee noted. No other findings are noted to suggest bony metastatic disease. IMPRESSION: Mild increased activity within the superior endplates of L3, L4 and L5 likely representing subacute fractures. Increased activity within the T12 vertebral body compatible with known compression fracture/ postprocedural changes. No findings to suggest bony metastatic disease. Degenerative changes in the lateral left knee. Electronically Signed   By: JMargarette CanadaM.D.   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 M protein level was about 2.3 g/dL recent bone scans does not show obvious bone tumors.  Labs done in January 2017 did not show any signs of anemia, hypercalcemia or renal failure. Plan -I discussed in detail walk the presence of a monoclonal paraprotein remains including the spectrum of associated disorders. -She was given the [employer] patient handout regarding plasma cell neoplasms. -Given significantly increased high-dose immunoglobulin (non IgG) would recommend getting a CT-guided bone marrow biopsy/aspiration. Referral placed for radiology to do this. -We shall repeat labs including CBC, CMP, , myeloma panel, serum free light  chains, sedimentation rate, LDH, beta-2 microglobulin. -24 hour urine for total protein and UPEP. -Patient has already had a bone scan as noted above. -Patient to return to clinic with Dr. Irene Limbo in 2 weeks after completion of about workup to discuss the results and plan further follow-up.  #2 3-4 cm soft tissue mass in the rectum noted incidentally on her recent CT abdomen pelvis. 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 Current findings might represent a recurrent polyp but will need to rule out rectal cancer.  #3 liver cirrhosis with evidence of varices on recent imaging as well as ascites. Plan -Gastroenterology referral given to [facility] to see Dr Henrene Pastor was previously been her primary gastroenterologist for consideration of sigmoidoscopy versus colonoscopy for evaluation of rectal mass. -Also to reestablish cares for ongoing management of her liver cirrhosis related issues. -Counseled on absolute alcohol cessation.  #4 osteoporosis with vertebral compression fractures.  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 Plan  -Patient has been following with orthopedics Dr. Wandra Feinstein to manage her osteoporosis . -Has been on high-dose vitamin D replacement    All of the patients and her female partners questions were answered to their apparent satisfaction. The patient knows to call the clinic with any problems, questions or concerns.  I spent 60 minutes counseling the patient face to face. The total time spent in the appointment was 60 minutes and more than 50% was on counseling and direct patient cares.    Sullivan Lone MD Kayenta AAHIVMS [facility] Hematology/Oncology [facility]  (Office):       [phone] (Work cell):  [phone] (Fax):           [phone]

## 2015-09-30 NOTE — Telephone Encounter (Signed)
Gave and printed appt sched and avs for pt for March. °

## 2015-09-30 NOTE — Addendum Note (Signed)
Addended by: Kellie Simmering A on: 09/30/2015 12:16 PM   Modules accepted: Orders, Medications

## 2015-10-01 ENCOUNTER — Telehealth: Payer: Self-pay | Admitting: Gastroenterology

## 2015-10-01 LAB — MULTIPLE MYELOMA PANEL, SERUM
ALBUMIN/GLOB SERPL: 0.7 (ref 0.7–1.7)
ALPHA 1: 0.3 g/dL (ref 0.0–0.4)
ALPHA2 GLOB SERPL ELPH-MCNC: 0.7 g/dL (ref 0.4–1.0)
Albumin SerPl Elph-Mcnc: 3 g/dL (ref 2.9–4.4)
B-Globulin SerPl Elph-Mcnc: 2.5 g/dL — ABNORMAL HIGH (ref 0.7–1.3)
Gamma Glob SerPl Elph-Mcnc: 1.5 g/dL (ref 0.4–1.8)
Globulin, Total: 5 g/dL — ABNORMAL HIGH (ref 2.2–3.9)
IGM (IMMUNOGLOBIN M), SRM: 291 mg/dL — AB (ref 26–217)
IgA, Qn, Serum: 2517 mg/dL — ABNORMAL HIGH (ref 87–352)
M Protein SerPl Elph-Mcnc: 1.5 g/dL — ABNORMAL HIGH
Total Protein: 8 g/dL (ref 6.0–8.5)

## 2015-10-01 LAB — SEDIMENTATION RATE: SED RATE: 109 mm/h — AB (ref 0–40)

## 2015-10-01 LAB — KAPPA/LAMBDA LIGHT CHAINS
IG LAMBDA FREE LIGHT CHAIN: 17.46 mg/L (ref 5.71–26.30)
Ig Kappa Free Light Chain: 59.2 mg/L — ABNORMAL HIGH (ref 3.30–19.40)
Kappa/Lambda FluidC Ratio: 3.39 — ABNORMAL HIGH (ref 0.26–1.65)

## 2015-10-01 NOTE — Telephone Encounter (Signed)
This is a Dr Henrene Pastor pt.

## 2015-10-02 ENCOUNTER — Other Ambulatory Visit: Payer: Self-pay | Admitting: Hematology

## 2015-10-02 LAB — BETA 2 MICROGLOBULIN, SERUM: Beta-2: 2.5 mg/L — ABNORMAL HIGH (ref 0.6–2.4)

## 2015-10-02 NOTE — Telephone Encounter (Signed)
This CT scan was from 2015. The patient subsequently had a large precancerous but benign polyp removed while in the hospital via colonoscopy. No follow-up recommended secondary to comorbidities (Dr. Carlean Purl). Please inform the oncologist of this information and document response. Thanks

## 2015-10-02 NOTE — Telephone Encounter (Signed)
Dr. Grier Mitts nurse apologized for him and states he was looking at an old CT scan, please disregard the referral for rectal mass. He would like Dr. Henrene Pastor to see the pt to manage her cirrhosis. Is first available appt ok with you Dr. Henrene Pastor?

## 2015-10-02 NOTE — Telephone Encounter (Signed)
Dr. Irene Limbo from the cancer center has sent a referral for this pt for possible rectal mass on CT scan. CT was done 05/14/14. Pt had colon done 05/21/14 and had large rectal polyp removed. Please advise as to how soon pt should be seen. No midlevel appointments available and pt requesting to be seen sooner than 1st available.

## 2015-10-02 NOTE — Telephone Encounter (Signed)
She can return to the care of her PCP. No need for GI follow-up unless she is having unmanageable issues related to her liver. Thanks

## 2015-10-02 NOTE — Telephone Encounter (Signed)
Left message for pt to call back.  Spoke with pt and she is aware. Pt knows to call if she is having any problems her PCP cannot help with.

## 2015-10-07 ENCOUNTER — Telehealth: Payer: Self-pay | Admitting: Hematology

## 2015-10-07 DIAGNOSIS — E8809 Other disorders of plasma-protein metabolism, not elsewhere classified: Secondary | ICD-10-CM | POA: Diagnosis not present

## 2015-10-07 NOTE — Telephone Encounter (Signed)
Pt aware of changed appt. 10/24/15@10 :00

## 2015-10-08 LAB — UPEP/TP, 24-HR URINE
Albumin, U: 0 %
Alpha-1-Globulin, U: 0 %
Alpha-2-Globulin, U: 0 %
BETA GLOBULIN, U: 0 %
Gamma Globulin, U: 0 %
PROTEIN 24H UR: 53.3 mg/(24.h) (ref 30.0–150.0)
PROTEIN UR: 4.1 mg/dL

## 2015-10-14 ENCOUNTER — Other Ambulatory Visit: Payer: Self-pay

## 2015-10-14 ENCOUNTER — Ambulatory Visit: Payer: Self-pay | Admitting: Hematology

## 2015-10-15 ENCOUNTER — Ambulatory Visit (HOSPITAL_COMMUNITY): Payer: Medicare Other

## 2015-10-15 ENCOUNTER — Other Ambulatory Visit: Payer: Self-pay | Admitting: Radiology

## 2015-10-16 ENCOUNTER — Ambulatory Visit (HOSPITAL_COMMUNITY)
Admission: RE | Admit: 2015-10-16 | Discharge: 2015-10-16 | Disposition: A | Discharge status: Home / Self Care | Payer: Medicare Other | Source: Ambulatory Visit | Attending: Hematology | Admitting: Hematology

## 2015-10-16 ENCOUNTER — Encounter (HOSPITAL_COMMUNITY): Payer: Self-pay

## 2015-10-16 DIAGNOSIS — F17210 Nicotine dependence, cigarettes, uncomplicated: Secondary | ICD-10-CM | POA: Insufficient documentation

## 2015-10-16 DIAGNOSIS — D472 Monoclonal gammopathy: Secondary | ICD-10-CM | POA: Diagnosis not present

## 2015-10-16 DIAGNOSIS — D4989 Neoplasm of unspecified behavior of other specified sites: Secondary | ICD-10-CM | POA: Diagnosis not present

## 2015-10-16 DIAGNOSIS — E8809 Other disorders of plasma-protein metabolism, not elsewhere classified: Secondary | ICD-10-CM

## 2015-10-16 DIAGNOSIS — D7589 Other specified diseases of blood and blood-forming organs: Secondary | ICD-10-CM | POA: Diagnosis not present

## 2015-10-16 DIAGNOSIS — J439 Emphysema, unspecified: Secondary | ICD-10-CM | POA: Insufficient documentation

## 2015-10-16 DIAGNOSIS — C9000 Multiple myeloma not having achieved remission: Secondary | ICD-10-CM | POA: Insufficient documentation

## 2015-10-16 LAB — CBC
HCT: 41.2 % (ref 36.0–46.0)
HEMOGLOBIN: 14.1 g/dL (ref 12.0–15.0)
MCH: 35.7 pg — AB (ref 26.0–34.0)
MCHC: 34.2 g/dL (ref 30.0–36.0)
MCV: 104.3 fL — ABNORMAL HIGH (ref 78.0–100.0)
PLATELETS: 273 10*3/uL (ref 150–400)
RBC: 3.95 MIL/uL (ref 3.87–5.11)
RDW: 14.3 % (ref 11.5–15.5)
WBC: 6.8 10*3/uL (ref 4.0–10.5)

## 2015-10-16 LAB — APTT: APTT: 26 s (ref 24–37)

## 2015-10-16 LAB — PROTIME-INR
INR: 0.93 (ref 0.00–1.49)
Prothrombin Time: 12.7 seconds (ref 11.6–15.2)

## 2015-10-16 LAB — BONE MARROW EXAM

## 2015-10-16 MED ORDER — MIDAZOLAM HCL 2 MG/2ML IJ SOLN
INTRAMUSCULAR | Status: AC
  Filled 2015-10-16: qty 4

## 2015-10-16 MED ORDER — SODIUM CHLORIDE 0.9 % IV SOLN
Freq: Once | INTRAVENOUS | Status: AC
  Administered 2015-10-16: 09:00:00 via INTRAVENOUS

## 2015-10-16 MED ORDER — FENTANYL CITRATE (PF) 100 MCG/2ML IJ SOLN
INTRAMUSCULAR | Status: AC | PRN
  Administered 2015-10-16 (×3): 25 ug via INTRAVENOUS

## 2015-10-16 MED ORDER — FENTANYL CITRATE (PF) 100 MCG/2ML IJ SOLN
INTRAMUSCULAR | Status: AC
  Filled 2015-10-16: qty 2

## 2015-10-16 MED ORDER — MIDAZOLAM HCL 2 MG/2ML IJ SOLN
INTRAMUSCULAR | Status: AC | PRN
  Administered 2015-10-16 (×2): 0.5 mg via INTRAVENOUS

## 2015-10-16 NOTE — Discharge Instructions (Signed)
Moderate Conscious Sedation, Adult, Care After °Refer to this sheet in the next few weeks. These instructions provide you with information on caring for yourself after your procedure. Your health care provider may also give you more specific instructions. Your treatment has been planned according to current medical practices, but problems sometimes occur. Call your health care provider if you have any problems or questions after your procedure. °WHAT TO EXPECT AFTER THE PROCEDURE  °After your procedure: °· You may feel sleepy, clumsy, and have poor balance for several hours. °· Vomiting may occur if you eat too soon after the procedure. °HOME CARE INSTRUCTIONS °· Do not participate in any activities where you could become injured for at least 24 hours. Do not: °¨ Drive. °¨ Swim. °¨ Ride a bicycle. °¨ Operate heavy machinery. °¨ Cook. °¨ Use power tools. °¨ Climb ladders. °¨ Work from a high place. °· Do not make important decisions or sign legal documents until you are improved. °· If you vomit, drink water, juice, or soup when you can drink without vomiting. Make sure you have little or no nausea before eating solid foods. °· Only take over-the-counter or prescription medicines for pain, discomfort, or fever as directed by your health care provider. °· Make sure you and your family fully understand everything about the medicines given to you, including what side effects may occur. °· You should not drink alcohol, take sleeping pills, or take medicines that cause drowsiness for at least 24 hours. °· If you smoke, do not smoke without supervision. °· If you are feeling better, you may resume normal activities 24 hours after you were sedated. °· Keep all appointments with your health care provider. °SEEK MEDICAL CARE IF: °· Your skin is pale or bluish in color. °· You continue to feel nauseous or vomit. °· Your pain is getting worse and is not helped by medicine. °· You have bleeding or swelling. °· You are still  sleepy or feeling clumsy after 24 hours. °SEEK IMMEDIATE MEDICAL CARE IF: °· You develop a rash. °· You have difficulty breathing. °· You develop any type of allergic problem. °· You have a fever. °MAKE SURE YOU: °· Understand these instructions. °· Will watch your condition. °· Will get help right away if you are not doing well or get worse. °  °This information is not intended to replace advice given to you by your health care provider. Make sure you discuss any questions you have with your health care provider. °  °Document Released: 05/03/2013 Document Revised: 08/03/2014 Document Reviewed: 05/03/2013 °Elsevier Interactive Patient Education ©2016 [employer] °Bone Marrow Aspiration and Bone Marrow Biopsy, Care After °Refer to this sheet in the next few weeks. These instructions provide you with information about caring for yourself after your procedure. Your health care provider may also give you more specific instructions. Your treatment has been planned according to current medical practices, but problems sometimes occur. Call your health care provider if you have any problems or questions after your procedure. °WHAT TO EXPECT AFTER THE PROCEDURE °After your procedure, it is common to have: °· Soreness or tenderness around the puncture site. °· Bruising. °HOME CARE INSTRUCTIONS °· Take medicines only as directed by your health care provider. °· Follow your health care provider's instructions about: °¨ Puncture site care. °¨ Bandage (dressing) changes and removal. °· Bathe and shower as directed by your health care provider. °· Check your puncture site every day for signs of infection. Watch for: °¨ Redness, swelling, or pain. °¨   Fluid, blood, or pus. °· Return to your normal activities as directed by your health care provider. °· Keep all follow-up visits as directed by your health care provider. This is important. °SEEK MEDICAL CARE IF: °· You have a fever. °· You have uncontrollable bleeding. °· You have  redness, swelling, or pain at the site of your puncture. °· You have fluid, blood, or pus coming from your puncture site. °  °This information is not intended to replace advice given to you by your health care provider. Make sure you discuss any questions you have with your health care provider. °  °Document Released: 01/30/2005 Document Revised: 11/27/2014 Document Reviewed: 07/04/2014 °Elsevier Interactive Patient Education ©2016 [employer] ° °

## 2015-10-16 NOTE — H&P (Signed)
Chief Complaint: Patient was seen in consultation today for bone marrow biopsy at the request of Brunetta Genera  Referring Physician(s): Brunetta Genera  Supervising Physician: Sandi Mariscal  History of Present Illness: Cassidy Barrett is a 70 y.o. female being worked up for monoclonal gammopathy of undetermined significance. She is referred for bone marrow biopsy. PMHx, meds, labs, imaging, allergies reviewed. Pt does not seem to tolerate narcotics very well, but has had sedation for colonoscopy without issue. Has been NPO this am No other c/o. Spouse at bedside  Past Medical History  Diagnosis Date  . Osteoporosis   . Alcohol abuse   . Cirrhosis ([place])   . Ascites   . Emphysema of lung ([place])   . Cholelithiasis   . Compression fracture of lumbar vertebra ([facility])      L1 and L2  . Atherosclerosis of aorta ([place])   . Macrocytic anemia   . Complication of anesthesia     Difficult time waking up after having biospy with colonoscopy  . Pneumonia 04/2014  . Arthritis     Past Surgical History  Procedure Laterality Date  . Tonsillectomy    . Flexible sigmoidoscopy N/A 05/21/2014    Procedure: FLEXIBLE SIGMOIDOSCOPY;  Surgeon: Gatha Mayer, MD;  Location: [facility];  Service: Endoscopy;  Laterality: N/A;  . Colonoscopy N/A 05/24/2014    Procedure: COLONOSCOPY;  Surgeon: Gatha Mayer, MD;  Location: [facility];  Service: Endoscopy;  Laterality: N/A;  MAC if possible ultraslim colonoscope and gastroscope needed  . Mandible fracture surgery      X 2  . Kyphoplasty N/A 12/12/2014    Procedure: KYPHOPLASTY;  Surgeon: Phylliss Bob, MD;  Location: [facility];  Service: Orthopedics;  Laterality: N/A;  T10 kyphoplasty    Allergies: Oxycodone; Tape; and Valium  Medications: Prior to Admission medications   Medication Sig Start Date End Date Taking? Authorizing Provider  albuterol (PROVENTIL HFA;VENTOLIN HFA) 108 (90 BASE) MCG/ACT inhaler Inhale 2 puffs into the lungs  every 4 (four) hours as needed for wheezing or shortness of breath. Patient not taking: Reported on 12/06/2014 05/25/14   Robbie Lis, MD  cyclobenzaprine (FLEXERIL) 5 MG tablet Take 5 mg by mouth 3 (three) times daily as needed for muscle spasms.    Historical Provider, MD  folic acid (FOLVITE) 1 MG tablet Take 1 tablet (1 mg total) by mouth daily. 05/25/14   Robbie Lis, MD  guaiFENesin-dextromethorphan (ROBITUSSIN DM) 100-10 MG/5ML syrup Take 5 mLs by mouth every 4 (four) hours as needed for cough. 05/25/14   Robbie Lis, MD  ondansetron (ZOFRAN) 4 MG tablet Take 1 tablet (4 mg total) by mouth every 6 (six) hours as needed for nausea. Patient not taking: Reported on 12/06/2014 05/25/14   Robbie Lis, MD  potassium chloride SA (K-DUR,KLOR-CON) 20 MEQ tablet Take 1 tablet (20 mEq total) by mouth daily. Patient not taking: Reported on 12/06/2014 05/25/14   Robbie Lis, MD  thiamine 100 MG tablet Take 1 tablet (100 mg total) by mouth daily. Patient not taking: Reported on 12/06/2014 05/25/14   Robbie Lis, MD  TRAMADOL HCL PO Take by mouth as needed.    Historical Provider, MD  Vitamin D, Ergocalciferol, (DRISDOL) 50000 UNITS CAPS capsule Take 50,000 Units by mouth every 7 (seven) days. Takes on [place] Provider, MD     History reviewed. No pertinent family history.  Social History   Social History  . Marital Status: Divorced  Spouse Name: N/A  . Number of Children: N/A  . Years of Education: N/A   Social History Main Topics  . Smoking status: Current Every Day Smoker -- 0.25 packs/day for 46 years    Types: Cigarettes    Last Attempt to Quit: 05/14/2014  . Smokeless tobacco: Never Used  . Alcohol Use: 0.0 oz/week    0 Standard drinks or equivalent per week     Comment: occasional  . Drug Use: No  . Sexual Activity: Not Asked   Other Topics Concern  . None   Social History Narrative    ECOG Status: 1 - Symptomatic but completely  ambulatory  Review of Systems: A 12 point ROS discussed and pertinent positives are indicated in the HPI above.  All other systems are negative.  Review of Systems  Vital Signs: There were no vitals taken for this visit.  Physical Exam  Constitutional: She is oriented to person, place, and time. She appears well-developed and well-nourished. No distress.  HENT:  Head: Normocephalic.  Mouth/Throat: Oropharynx is clear and moist.  Neck: Normal range of motion. No tracheal deviation present.  Cardiovascular: Normal rate, regular rhythm and normal heart sounds.   Pulmonary/Chest: Effort normal and breath sounds normal. No respiratory distress.  Neurological: She is alert and oriented to person, place, and time.  Skin: Skin is warm and dry.  Psychiatric: She has a normal mood and affect. Judgment normal.    Mallampati Score:  MD Evaluation Airway: WNL Heart: WNL Abdomen: WNL Chest/ Lungs: WNL ASA  Classification: 3 Mallampati/Airway Score: One  Imaging: Nm Bone Scan Whole Body  09/26/2015  CLINICAL DATA:  70 year old female with chronic back pain for 2 years. Bilateral hip pain for 1-1/2 weeks. No history of trauma. EXAM: NUCLEAR MEDICINE WHOLE BODY BONE SCAN TECHNIQUE: Whole body anterior and posterior images were obtained approximately 3 hours after intravenous injection of radiopharmaceutical. RADIOPHARMACEUTICALS:  24.4 mCi Technetium-57mMDP IV COMPARISON:  05/27/2015 lumbar spine MR and prior studies. FINDINGS: Mild increased activity within the superior endplates of L3, L4 and L5 likely representing subacute fractures. Increased activity within the T12 vertebral body likely represents known compression fractures/ vertebral augmentation changes Degenerative changes in the lateral left knee noted. No other findings are noted to suggest bony metastatic disease. IMPRESSION: Mild increased activity within the superior endplates of L3, L4 and L5 likely representing subacute fractures.  Increased activity within the T12 vertebral body compatible with known compression fracture/ postprocedural changes. No findings to suggest bony metastatic disease. Degenerative changes in the lateral left knee. Electronically Signed   By: JMargarette CanadaM.D.   On: 09/26/2015 14:06    Labs:  CBC:  Recent Labs  12/11/14 1546 09/30/15 1154 10/16/15 0854  WBC 7.6 6.7 6.8  HGB 13.2 13.8 14.1  HCT 38.6 39.5 41.2  PLT 205 189 273    COAGS:  Recent Labs  12/11/14 1546 10/16/15 0854  INR 1.01 0.93  APTT 28 26    BMP:  Recent Labs  12/11/14 1546 09/30/15 1154  NA 133* 132*  K 4.1 3.9  CL 97*  --   CO2 26 26  GLUCOSE 105* 107  BUN 8 5.1*  CALCIUM 9.7 9.2  CREATININE 0.56 0.6  GFRNONAA >60  --   GFRAA >60  --     LIVER FUNCTION TESTS:  Recent Labs  12/11/14 1546 09/30/15 1154  BILITOT 0.5 1.27*  AST 44* 71*  ALT 26 21  ALKPHOS 346* 606 repeated*  PROT 8.6* 8.9*  8.0  ALBUMIN 3.4* 2.8*    TUMOR MARKERS: No results for input(s): AFPTM, CEA, CA199, CHROMGRNA in the last 8760 hours.  Assessment and Plan: MGUS For CT guided bone marrow biopsy Labs reviewed. Risks and Benefits discussed with the patient including, but not limited to bleeding, infection, damage to adjacent structures or low yield requiring additional tests. All of the patient's questions were answered, patient is agreeable to proceed. Consent signed and in chart.    Thank you for this interesting consult.  I greatly enjoyed meeting Cassidy Barrett and look forward to participating in their care.  A copy of this report was sent to the requesting provider on this date.  Electronically Signed: Ascencion Dike 10/16/2015, 9:59 AM   I spent a total of 22 minutes in face to face in clinical consultation, greater than 50% of which was counseling/coordinating care for bone marrow biopsy

## 2015-10-16 NOTE — Sedation Documentation (Signed)
Patient denies pain and is resting comfortably.  

## 2015-10-16 NOTE — Procedures (Signed)
Technically successful CT guided bone marrow aspiration and biopsy of left iliac crest. EBL: None No immediate complications.    SignedSandi Mariscal [phone] 10/16/2015, 12:14 PM

## 2015-10-23 ENCOUNTER — Other Ambulatory Visit: Payer: Self-pay | Admitting: *Deleted

## 2015-10-23 DIAGNOSIS — E8809 Other disorders of plasma-protein metabolism, not elsewhere classified: Secondary | ICD-10-CM

## 2015-10-24 ENCOUNTER — Ambulatory Visit (HOSPITAL_BASED_OUTPATIENT_CLINIC_OR_DEPARTMENT_OTHER): Payer: Medicare Other | Admitting: Hematology

## 2015-10-24 ENCOUNTER — Telehealth: Payer: Self-pay | Admitting: Hematology

## 2015-10-24 ENCOUNTER — Other Ambulatory Visit (HOSPITAL_BASED_OUTPATIENT_CLINIC_OR_DEPARTMENT_OTHER): Payer: Medicare Other

## 2015-10-24 VITALS — BP 136/77 | HR 110 | Temp 97.9°F | Resp 18 | Ht 60.0 in | Wt 85.0 lb

## 2015-10-24 DIAGNOSIS — M810 Age-related osteoporosis without current pathological fracture: Secondary | ICD-10-CM | POA: Diagnosis not present

## 2015-10-24 DIAGNOSIS — D472 Monoclonal gammopathy: Secondary | ICD-10-CM

## 2015-10-24 DIAGNOSIS — E8809 Other disorders of plasma-protein metabolism, not elsewhere classified: Secondary | ICD-10-CM

## 2015-10-24 DIAGNOSIS — IMO0002 Reserved for concepts with insufficient information to code with codable children: Secondary | ICD-10-CM

## 2015-10-24 DIAGNOSIS — C9000 Multiple myeloma not having achieved remission: Secondary | ICD-10-CM | POA: Insufficient documentation

## 2015-10-24 DIAGNOSIS — E559 Vitamin D deficiency, unspecified: Secondary | ICD-10-CM

## 2015-10-24 LAB — CBC WITH DIFFERENTIAL/PLATELET
BASO%: 1 % (ref 0.0–2.0)
Basophils Absolute: 0.1 10*3/uL (ref 0.0–0.1)
EOS%: 4.9 % (ref 0.0–7.0)
Eosinophils Absolute: 0.3 10*3/uL (ref 0.0–0.5)
HEMATOCRIT: 43.2 % (ref 34.8–46.6)
HEMOGLOBIN: 14.6 g/dL (ref 11.6–15.9)
LYMPH#: 1.5 10*3/uL (ref 0.9–3.3)
LYMPH%: 23.3 % (ref 14.0–49.7)
MCH: 35.1 pg — ABNORMAL HIGH (ref 25.1–34.0)
MCHC: 33.8 g/dL (ref 31.5–36.0)
MCV: 103.7 fL — ABNORMAL HIGH (ref 79.5–101.0)
MONO#: 0.9 10*3/uL (ref 0.1–0.9)
MONO%: 14 % (ref 0.0–14.0)
NEUT#: 3.6 10*3/uL (ref 1.5–6.5)
NEUT%: 56.8 % (ref 38.4–76.8)
Platelets: 266 10*3/uL (ref 145–400)
RBC: 4.17 10*6/uL (ref 3.70–5.45)
RDW: 14.6 % — AB (ref 11.2–14.5)
WBC: 6.3 10*3/uL (ref 3.9–10.3)

## 2015-10-24 LAB — COMPREHENSIVE METABOLIC PANEL
ALBUMIN: 3 g/dL — AB (ref 3.5–5.0)
ALK PHOS: 469 U/L — AB (ref 40–150)
ALT: 21 U/L (ref 0–55)
AST: 52 U/L — AB (ref 5–34)
Anion Gap: 8 mEq/L (ref 3–11)
BUN: <4 mg/dL — ABNORMAL LOW (ref 7.0–26.0)
CALCIUM: 9.4 mg/dL (ref 8.4–10.4)
CHLORIDE: 94 meq/L — AB (ref 98–109)
CO2: 31 mEq/L — ABNORMAL HIGH (ref 22–29)
Creatinine: 0.7 mg/dL (ref 0.6–1.1)
EGFR: >90 mL/min/{1.73_m2} (ref >90)
Glucose: 129 mg/dl (ref 70–140)
Potassium: 4.2 mEq/L (ref 3.5–5.1)
SODIUM: 134 meq/L — AB (ref 136–145)
Total Bilirubin: 0.87 mg/dL (ref 0.20–1.20)
Total Protein: 8.9 g/dL — ABNORMAL HIGH (ref 6.4–8.3)

## 2015-10-24 NOTE — Telephone Encounter (Signed)
Gave and printed appt sched and avs for pt for JUNE °

## 2015-10-24 NOTE — Progress Notes (Signed)
[facility]    HEMATOLOGY/ONCOLOGY CONSULTATION NOTE  Date of Service: 10/24/2015  Patient Care Team: Everardo Beals, NP as PCP - General  Orthopedics - Dr. Layne Benton from [facility]   CHIEF COMPLAINTS/PURPOSE OF CONSULTATION:  Elevated M protein and vertebral compression fracture. Elevated sedimentation rate and total protein.  HISTORY OF PRESENTING ILLNESS:   Cassidy Barrett is a wonderful 70 y.o. female who has been referred to [place] by Dr Wandra Feinstein for evaluation and management of possible plasma cell dyscrasia.  Patient has a history of liver cirrhosis with ascites, alcohol abuse, emphysema, macrocytic anemia, osteoporosis and was seen by orthopedics for spinal vertebral compression fractures in the setting of osteoporosis.  She was noted to have multiple significant lab abnormalities including an elevated alkaline phosphatase and high total protein level.  Chronic appealing L1-L2 compression fractures  Patient subsequently had a bone scan to evaluate these compression fractures on 09/26/2015.  This showed mild increased activity within the superior endplates of L3, L4 and L5 likely representing subacute fractures. Increased activity within the T2 vertebral body compatible with known compression fracture. No evidence of suggest bony metastatic disease.  As a part of her workup she also had an SPEP done which shows 3 monoclonal protein bands for a total M protein of 2.3 g/dL. On IFE this is noted to be monoclonal IgA kappa protein. Her sedimentation rate was also elevated at 73.  She was referred to [place] for further evaluation to rule out multiple myeloma. Patient notes her mid and lower back pain related to her compression fractures. Also notes some left hip pain. Her labs from January 2017 did not show any anemia, hypercalcemia or renal failure.  She notes that she did have a colonoscopy in 2015 which demonstrated a 4.5 cm rectal polyp which was removed  piecemeal. Pathology showed a tubulovillous adenoma. Patient was also noted to have significant diverticulosis and was not recommended routine colonoscopies. Given her CT scan showing concern of rectal mass she was referred back to Dr. Henrene Pastor at [facility] for consideration of a sigmoidoscopy/colonoscopy. Patient notes no overt rectal bleeding. No obstructive symptoms. No change in bowel habits. No new abdominal pain. She also needs to connect with gi for management of her liver cirrhosis and related issues.   INTERVAL HISTORY  Patient is here to f/u on her Bone scan and BM Bx results. She notes that she had a bone density test with her PCP and that her Z score and T scores were <-4 and that her PCP was considering treating her with Prolia. We discussed her Bm Bx results and her labs and bone scan in details.  MEDICAL HISTORY:  Past Medical History  Diagnosis Date  . Osteoporosis   . Alcohol abuse   . Cirrhosis ([place])   . Ascites   . Emphysema of lung ([place])   . Cholelithiasis   . Compression fracture of lumbar vertebra ([facility])      L1 and L2  . Atherosclerosis of aorta ([place])   . Macrocytic anemia   . Complication of anesthesia     Difficult time waking up after having biospy with colonoscopy  . Pneumonia 04/2014  . 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  SURGICAL HISTORY: Past Surgical History  Procedure Laterality Date  . Tonsillectomy    . Flexible sigmoidoscopy N/A 05/21/2014    Procedure: FLEXIBLE SIGMOIDOSCOPY;  Surgeon: Gatha Mayer, MD;  [facility];  Service: Endoscopy;  Laterality:  N/A;  . Colonoscopy N/A 05/24/2014    Procedure: COLONOSCOPY;  Surgeon: Gatha Mayer, MD;  [facility];  Service: Endoscopy;  Laterality: N/A;  MAC if possible ultraslim colonoscope and gastroscope needed  . Mandible fracture surgery      X 2  . Kyphoplasty N/A 12/12/2014    Procedure: KYPHOPLASTY;  Surgeon: Phylliss Bob,  MD;  Location: [facility];  Service: Orthopedics;  Laterality: N/A;  T10 kyphoplasty    SOCIAL HISTORY: Social History   Social History  . Marital Status: Divorced    Spouse Name: N/A  . Number of Children: N/A  . Years of Education: N/A   Occupational History  . Not on file.   Social History Main Topics  . Smoking status: Current Every Day Smoker -- 0.25 packs/day for 46 years    Types: Cigarettes    Last Attempt to Quit: 05/14/2014  . Smokeless tobacco: Never Used  . Alcohol Use: 0.0 oz/week    0 Standard drinks or equivalent per week     Comment: occasional  . Drug Use: No  . Sexual Activity: Not on file   Other Topics Concern  . Not on file   Social History Narrative   Patient notes that she is drinking about 1 beer and 1 hard liquor every day - was counseled regarding absolute alcohol abstinence.  FAMILY HISTORY:  No family history of colon cancer.  ALLERGIES:  is allergic to oxycodone; tape; and valium.  MEDICATIONS:  Current Outpatient Prescriptions  Medication Sig Dispense Refill  . Calcium Citrate-Vitamin D (CALCIUM + D PO) Take by mouth.    . cyclobenzaprine (FLEXERIL) 5 MG tablet Take 5 mg by mouth 3 (three) times daily as needed for muscle spasms.    . Multiple Vitamins-Minerals (MULTIVITAMIN WITH MINERALS) tablet Take 1 tablet by mouth daily.    . TRAMADOL HCL PO Take by mouth as needed.    . Vitamin D, Ergocalciferol, (DRISDOL) 50000 UNITS CAPS capsule Take 50,000 Units by mouth every 7 (seven) days. Takes on wednesdays     No current facility-administered medications for this visit.    REVIEW OF SYSTEMS:    10 Point review of Systems was done is negative except as noted above.  PHYSICAL EXAMINATION: ECOG PERFORMANCE STATUS: 2 - Symptomatic, <50% confined to bed  . Filed Vitals:   10/24/15 1015  BP: 136/77  Pulse: 110  Temp: 97.9 F (36.6 C)  Resp: 18   Filed Weights   10/24/15 1015  Weight: 85 lb (38.556 kg)   .Body mass index is 16.6  kg/(m^2).  GENERAL:alert, in no acute distress and comfortable SKIN:No acute rashes  EYES: normal, conjunctiva are pink and non-injected, sclera clear OROPHARYNX:no exudate, no erythema and lips, buccal mucosa, and tongue normal  NECK: supple, no JVD, thyroid normal size, non-tender, without nodularity LYMPH:  no palpable lymphadenopathy in the cervical, axillary or inguinal LUNGS: clear to auscultation with normal respiratory effort HEART: regular rate & rhythm,  no murmurs and no lower extremity edema ABDOMEN: abdomen soft, non-tender, normoactive bowel sounds , no palpable hepatosplenomegaly Musculoskeletal: No pedal edema PSYCH: alert & oriented x 3 with fluent speech NEURO: no focal motor/sensory deficits  LABORATORY DATA:  I have reviewed the data as listed  . CBC Latest Ref Rng 10/24/2015 10/16/2015 09/30/2015  WBC 3.9 - 10.3 10e3/uL 6.3 6.8 6.7  Hemoglobin 11.6 - 15.9 g/dL 14.6 14.1 13.8  Hematocrit 34.8 - 46.6 % 43.2 41.2 39.5  Platelets 145 - 400 10e3/uL 266  273 189    . CMP Latest Ref Rng 10/24/2015 09/30/2015 09/30/2015  Glucose 70 - 140 mg/dl 129 107 -  BUN 7.0 - 26.0 mg/dL <4.0(L) 5.1(L) -  Creatinine 0.6 - 1.1 mg/dL 0.7 0.6 -  Sodium 136 - 145 mEq/L 134(L) 132(L) -  Potassium 3.5 - 5.1 mEq/L 4.2 3.9 -  Chloride 101 - 111 mmol/L - - -  CO2 22 - 29 mEq/L 31(H) 26 -  Calcium 8.4 - 10.4 mg/dL 9.4 9.2 -  Total Protein 6.4 - 8.3 g/dL 8.9(H) 8.9(H) 8.0  Total Bilirubin 0.20 - 1.20 mg/dL 0.87 1.27(H) -  Alkaline Phos 40 - 150 U/L 469(H) 606 repeated(H) -  AST 5 - 34 U/L 52(H) 71(H) -  ALT 0 - 55 U/L 21 21 -   Component     Latest Ref Rng 09/30/2015 10/07/2015  IgG (Immunoglobin G), Serum     700 - 1600 mg/dL 1,274   IgA/Immunoglobulin A, Serum     87 - 352 mg/dL 2,517 (H)   IgM (Immunoglobin M), Srm     26 - 217 mg/dL 291 (H)   Total Protein     6.0 - 8.5 g/dL 8.0   Albumin SerPl Elph-Mcnc     2.9 - 4.4 g/dL 3.0   Alpha 1     0.0 - 0.4 g/dL 0.3   Alpha2 Glob SerPl  Elph-Mcnc     0.4 - 1.0 g/dL 0.7   B-Globulin SerPl Elph-Mcnc     0.7 - 1.3 g/dL 2.5 (H)   Gamma Glob SerPl Elph-Mcnc     0.4 - 1.8 g/dL 1.5   M Protein SerPl Elph-Mcnc     Not Observed g/dL 1.5 (H)   Globulin, Total     2.2 - 3.9 g/dL 5.0 (H)   Albumin/Glob SerPl     0.7 - 1.7 0.7   IFE 1      Comment   Please Note (HCV):      Comment Comment  Protein Urine Random     Not Estab. mg/dL  4.1  Prot,24hr calculated     30.0 - 150.0 mg/24 hr  53.3  ALBUMIN, U       0.0  Alpha-1-Globulin, U       0.0  ALPHA-2-GLOBULIN, U       0.0  Beta Globulin, U       0.0  Gamma Globulin, U       0.0  M-spike, %     Not Observed %  Not Observed  Ig Kappa Free Light Chain     3.30 - 19.40 mg/L 59.20 (H)   Ig Lambda Free Light Chain     5.71 - 26.30 mg/L 17.46   Kappa/Lambda FluidC Ratio     0.26 - 1.65 3.39 (H)   LDH     125 - 245 U/L 122 (L)   Beta 2     0.6 - 2.4 mg/L 2.5 (H)   Sed Rate     0 - 40 mm/hr 109 (H)    Multiple Myeloma Panel (SPEP&IFE w/QIG)      No reference range information available      Comments:           Immunofixation shows IgA monoclonal protein with kappa light           chain           specificity.           An apparent polyclonal gammopathy: IgM. Kappa and  lambda            typing           appear increased.    RADIOGRAPHIC STUDIES: I have personally reviewed the radiological images as listed and agreed with the findings in the report. Nm Bone Scan Whole Body  09/26/2015  CLINICAL DATA:  70 year old female with chronic back pain for 2 years. Bilateral hip pain for 1-1/2 weeks. No history of trauma. EXAM: NUCLEAR MEDICINE WHOLE BODY BONE SCAN TECHNIQUE: Whole body anterior and posterior images were obtained approximately 3 hours after intravenous injection of radiopharmaceutical. RADIOPHARMACEUTICALS:  24.4 mCi Technetium-52mMDP IV COMPARISON:  05/27/2015 lumbar spine MR and prior studies. FINDINGS: Mild increased activity within the superior endplates  of L3, L4 and L5 likely representing subacute fractures. Increased activity within the T12 vertebral body likely represents known compression fractures/ vertebral augmentation changes Degenerative changes in the lateral left knee noted. No other findings are noted to suggest bony metastatic disease. IMPRESSION: Mild increased activity within the superior endplates of L3, L4 and L5 likely representing subacute fractures. Increased activity within the T12 vertebral body compatible with known compression fracture/ postprocedural changes. No findings to suggest bony metastatic disease. Degenerative changes in the lateral left knee. Electronically Signed   By: JMargarette CanadaM.D.   On: 09/26/2015 14:06   Ct Biopsy  10/16/2015  INDICATION: MGUS. Plasma cell dyscrasia. Please perform CT-guided bone marrow biopsy for tissue diagnostic purposes. EXAM: CT-GUIDED BONE MARROW BIOPSY AND ASPIRATION MEDICATIONS: None ANESTHESIA/SEDATION: Fentanyl 75 mcg IV; Versed 1 mg IV Sedation Time: 11 minutes; The patient was continuously monitored during the procedure by the interventional radiology nurse under my direct supervision. COMPLICATIONS: None immediate. PROCEDURE: Informed consent was obtained from the patient following an explanation of the procedure, risks, benefits and alternatives. The patient understands, agrees and consents for the procedure. All questions were addressed. A time out was performed prior to the initiation of the procedure. The patient was positioned prone and non-contrast localization CT was performed of the pelvis to demonstrate the iliac marrow spaces. The operative site was prepped and draped in the usual sterile fashion. Under sterile conditions and local anesthesia, a 22 gauge spinal needle was utilized for procedural planning. Next, an 11 gauge coaxial bone biopsy needle was advanced into the left iliac marrow space. Needle position was confirmed with CT imaging. Initially, bone marrow aspiration was  performed. Next, a bone marrow biopsy was obtained with the 11 gauge outer bone marrow device. Samples were prepared with the cytotechnologist and deemed adequate. The needle was removed intact. Hemostasis was obtained with compression and a dressing was placed. The patient tolerated the procedure well without immediate post procedural complication. IMPRESSION: Successful CT guided left iliac bone marrow aspiration and core biopsy. Electronically Signed   By: JSandi MariscalM.D.   On: 10/16/2015 13:30     ASSESSMENT & PLAN:   70year old Caucasian female with severe osteoporosis and vertebral compression fractures  #1 IgA kappa Smoldering Multiple myeloma with M protein level was about 2.3 g/dL (outside lab) rpt in our lab 1.5g/dl recent bone scans does not show obvious bone tumors.  Labs with no signs of anemia, hypercalcemia or renal failure. Bm Bx shows 21% abnormal plasma cells. Plan -we discussed the diagnosis, natural history, treatment criteria and importance of close followup. -currently does not meet criteria for MM and intiation of treatment. -has a 5-15% chance of progression to multiple myeloma per year. -given NCI material regarding plasma cell neoplasms to  read. -rtc with Dr Irene Limbo in 3 months with rpt labs.  #2 liver cirrhosis with known varices Plan -Counseled on absolute alcohol cessation. -continue f/u with PCP  #3 osteoporosis with vertebral compression fractures.  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 Unclear if this truly represents ONJ ---patient decribes it as "having brittle teeth" Plan  -Patient has been following with orthopedics Dr. Wandra Feinstein to manage her osteoporosis . -Has been on high-dose vitamin D replacement   RTC with Dr Irene Limbo in 3 months.  All of the patients and her female partners questions were answered to their apparent satisfaction. The patient knows to call the clinic with any problems, questions or  concerns.  I spent 40 minutes counseling the patient face to face. The total time spent in the appointment was 40 minutes and more than 50% was on counseling and direct patient cares.    Sullivan Lone MD [place] AAHIVMS [facility] Hematology/Oncology Physician [facility]  (Office):       [phone] (Work cell):  [phone] (Fax):           [phone]

## 2015-10-28 LAB — CHROMOSOME ANALYSIS, BONE MARROW

## 2015-10-28 LAB — TISSUE HYBRIDIZATION (BONE MARROW)-NCBH

## 2015-10-30 DIAGNOSIS — M810 Age-related osteoporosis without current pathological fracture: Secondary | ICD-10-CM | POA: Diagnosis not present

## 2015-11-01 DIAGNOSIS — M545 Low back pain: Secondary | ICD-10-CM | POA: Diagnosis not present

## 2015-11-01 DIAGNOSIS — M4856XD Collapsed vertebra, not elsewhere classified, lumbar region, subsequent encounter for fracture with routine healing: Secondary | ICD-10-CM | POA: Diagnosis not present

## 2015-11-01 DIAGNOSIS — M546 Pain in thoracic spine: Secondary | ICD-10-CM | POA: Diagnosis not present

## 2015-11-05 DIAGNOSIS — M546 Pain in thoracic spine: Secondary | ICD-10-CM | POA: Diagnosis not present

## 2015-11-05 DIAGNOSIS — M545 Low back pain: Secondary | ICD-10-CM | POA: Diagnosis not present

## 2015-11-05 DIAGNOSIS — M4856XD Collapsed vertebra, not elsewhere classified, lumbar region, subsequent encounter for fracture with routine healing: Secondary | ICD-10-CM | POA: Diagnosis not present

## 2015-11-07 DIAGNOSIS — M546 Pain in thoracic spine: Secondary | ICD-10-CM | POA: Diagnosis not present

## 2015-11-07 DIAGNOSIS — M545 Low back pain: Secondary | ICD-10-CM | POA: Diagnosis not present

## 2015-11-07 DIAGNOSIS — M4856XD Collapsed vertebra, not elsewhere classified, lumbar region, subsequent encounter for fracture with routine healing: Secondary | ICD-10-CM | POA: Diagnosis not present

## 2015-11-12 DIAGNOSIS — M4856XD Collapsed vertebra, not elsewhere classified, lumbar region, subsequent encounter for fracture with routine healing: Secondary | ICD-10-CM | POA: Diagnosis not present

## 2015-11-12 DIAGNOSIS — M546 Pain in thoracic spine: Secondary | ICD-10-CM | POA: Diagnosis not present

## 2015-11-12 DIAGNOSIS — M545 Low back pain: Secondary | ICD-10-CM | POA: Diagnosis not present

## 2015-11-15 DIAGNOSIS — M4856XD Collapsed vertebra, not elsewhere classified, lumbar region, subsequent encounter for fracture with routine healing: Secondary | ICD-10-CM | POA: Diagnosis not present

## 2015-11-15 DIAGNOSIS — M546 Pain in thoracic spine: Secondary | ICD-10-CM | POA: Diagnosis not present

## 2015-11-15 DIAGNOSIS — M545 Low back pain: Secondary | ICD-10-CM | POA: Diagnosis not present

## 2015-11-17 ENCOUNTER — Other Ambulatory Visit: Payer: Self-pay | Admitting: Hematology

## 2015-11-18 ENCOUNTER — Ambulatory Visit: Payer: Self-pay | Admitting: Hematology

## 2015-11-21 DIAGNOSIS — M546 Pain in thoracic spine: Secondary | ICD-10-CM | POA: Diagnosis not present

## 2015-11-21 DIAGNOSIS — M4856XD Collapsed vertebra, not elsewhere classified, lumbar region, subsequent encounter for fracture with routine healing: Secondary | ICD-10-CM | POA: Diagnosis not present

## 2015-11-21 DIAGNOSIS — M545 Low back pain: Secondary | ICD-10-CM | POA: Diagnosis not present

## 2015-11-26 DIAGNOSIS — M4856XD Collapsed vertebra, not elsewhere classified, lumbar region, subsequent encounter for fracture with routine healing: Secondary | ICD-10-CM | POA: Diagnosis not present

## 2015-11-26 DIAGNOSIS — M545 Low back pain: Secondary | ICD-10-CM | POA: Diagnosis not present

## 2015-11-26 DIAGNOSIS — M546 Pain in thoracic spine: Secondary | ICD-10-CM | POA: Diagnosis not present

## 2015-12-02 ENCOUNTER — Encounter: Payer: Self-pay | Admitting: Hematology

## 2015-12-03 ENCOUNTER — Other Ambulatory Visit: Payer: Self-pay | Admitting: *Deleted

## 2015-12-03 ENCOUNTER — Telehealth: Payer: Self-pay | Admitting: *Deleted

## 2015-12-03 NOTE — Telephone Encounter (Signed)
Called and left message with medical record department at [facility] (Dr. Wandra Feinstein) per Dr. Irene Limbo in order to receive additional information regarding reason for stopping fosamax previously (pt referred for osteoporosis/possible bisphosphonate infusion).  LVM with patient to inform her that we are waiting to hear from Dr. Layne Benton in order to set up infusion, also waiting on dental clearance.  Informed pt in voice message that we have attempted reaching Dr. Kathrin Penner office multiple times with no response.

## 2015-12-04 DIAGNOSIS — M545 Low back pain: Secondary | ICD-10-CM | POA: Diagnosis not present

## 2015-12-04 DIAGNOSIS — M546 Pain in thoracic spine: Secondary | ICD-10-CM | POA: Diagnosis not present

## 2015-12-04 DIAGNOSIS — M4856XD Collapsed vertebra, not elsewhere classified, lumbar region, subsequent encounter for fracture with routine healing: Secondary | ICD-10-CM | POA: Diagnosis not present

## 2015-12-12 DIAGNOSIS — M545 Low back pain: Secondary | ICD-10-CM | POA: Diagnosis not present

## 2015-12-12 DIAGNOSIS — M546 Pain in thoracic spine: Secondary | ICD-10-CM | POA: Diagnosis not present

## 2015-12-12 DIAGNOSIS — M4856XD Collapsed vertebra, not elsewhere classified, lumbar region, subsequent encounter for fracture with routine healing: Secondary | ICD-10-CM | POA: Diagnosis not present

## 2015-12-20 DIAGNOSIS — M4856XD Collapsed vertebra, not elsewhere classified, lumbar region, subsequent encounter for fracture with routine healing: Secondary | ICD-10-CM | POA: Diagnosis not present

## 2015-12-20 DIAGNOSIS — M546 Pain in thoracic spine: Secondary | ICD-10-CM | POA: Diagnosis not present

## 2015-12-20 DIAGNOSIS — M545 Low back pain: Secondary | ICD-10-CM | POA: Diagnosis not present

## 2015-12-26 DIAGNOSIS — M4856XD Collapsed vertebra, not elsewhere classified, lumbar region, subsequent encounter for fracture with routine healing: Secondary | ICD-10-CM | POA: Diagnosis not present

## 2015-12-26 DIAGNOSIS — M546 Pain in thoracic spine: Secondary | ICD-10-CM | POA: Diagnosis not present

## 2015-12-26 DIAGNOSIS — M545 Low back pain: Secondary | ICD-10-CM | POA: Diagnosis not present

## 2016-01-01 ENCOUNTER — Telehealth: Payer: Self-pay | Admitting: *Deleted

## 2016-01-01 NOTE — Telephone Encounter (Signed)
Pt faxed letter from dentist stating dental work that needed to be done.  Pt then LVM asking if faxed information was sufficient to have biphosphonate infusion.  Per Dr. Irene Limbo, pt needs to provide records stating exact reason for originally stopping fosamax (pt stated she had adverse reaction), and dental clearance prior to ordering infusion.  LVM with patient stating this, provided fax number to send records.

## 2016-01-17 ENCOUNTER — Telehealth: Payer: Self-pay | Admitting: *Deleted

## 2016-01-17 NOTE — Telephone Encounter (Signed)
Patient called and stated,"I called yesterday and no one returned my phone call. I need Dr. Irene Limbo to tell me "what do you need me to do?" Return number is [phone]. Informed patient that Dr. Irene Limbo was out of the office today. Patient verbalized understanding.

## 2016-01-24 ENCOUNTER — Telehealth: Payer: Self-pay | Admitting: Hematology

## 2016-01-24 ENCOUNTER — Other Ambulatory Visit (HOSPITAL_BASED_OUTPATIENT_CLINIC_OR_DEPARTMENT_OTHER): Payer: Medicare Other

## 2016-01-24 ENCOUNTER — Encounter: Payer: Self-pay | Admitting: Hematology

## 2016-01-24 ENCOUNTER — Ambulatory Visit (HOSPITAL_BASED_OUTPATIENT_CLINIC_OR_DEPARTMENT_OTHER): Payer: Medicare Other | Admitting: Hematology

## 2016-01-24 VITALS — BP 132/86 | HR 122 | Temp 98.1°F | Resp 17 | Ht 60.0 in | Wt 83.9 lb

## 2016-01-24 DIAGNOSIS — D472 Monoclonal gammopathy: Secondary | ICD-10-CM

## 2016-01-24 DIAGNOSIS — E559 Vitamin D deficiency, unspecified: Secondary | ICD-10-CM

## 2016-01-24 DIAGNOSIS — M8008XA Age-related osteoporosis with current pathological fracture, vertebra(e), initial encounter for fracture: Secondary | ICD-10-CM

## 2016-01-24 DIAGNOSIS — IMO0002 Reserved for concepts with insufficient information to code with codable children: Secondary | ICD-10-CM

## 2016-01-24 DIAGNOSIS — M810 Age-related osteoporosis without current pathological fracture: Secondary | ICD-10-CM | POA: Diagnosis not present

## 2016-01-24 DIAGNOSIS — C9000 Multiple myeloma not having achieved remission: Secondary | ICD-10-CM | POA: Diagnosis not present

## 2016-01-24 DIAGNOSIS — T148 Other injury of unspecified body region: Secondary | ICD-10-CM | POA: Diagnosis not present

## 2016-01-24 LAB — CBC & DIFF AND RETIC
BASO%: 0.8 % (ref 0.0–2.0)
Basophils Absolute: 0.1 10*3/uL (ref 0.0–0.1)
EOS ABS: 0.2 10*3/uL (ref 0.0–0.5)
EOS%: 2 % (ref 0.0–7.0)
HCT: 38.3 % (ref 34.8–46.6)
HEMOGLOBIN: 13.7 g/dL (ref 11.6–15.9)
IMMATURE RETIC FRACT: 1.5 % — AB (ref 1.60–10.00)
LYMPH#: 1.7 10*3/uL (ref 0.9–3.3)
LYMPH%: 19.9 % (ref 14.0–49.7)
MCH: 36.1 pg — ABNORMAL HIGH (ref 25.1–34.0)
MCHC: 35.8 g/dL (ref 31.5–36.0)
MCV: 101.1 fL — ABNORMAL HIGH (ref 79.5–101.0)
MONO#: 1.1 10*3/uL — AB (ref 0.1–0.9)
MONO%: 12.3 % (ref 0.0–14.0)
NEUT%: 65 % (ref 38.4–76.8)
NEUTROS ABS: 5.6 10*3/uL (ref 1.5–6.5)
Platelets: 259 10*3/uL (ref 145–400)
RBC: 3.79 10*6/uL (ref 3.70–5.45)
RDW: 13.9 % (ref 11.2–14.5)
RETIC CT ABS: 109.91 10*3/uL — AB (ref 33.70–90.70)
Retic %: 2.9 % — ABNORMAL HIGH (ref 0.70–2.10)
WBC: 8.6 10*3/uL (ref 3.9–10.3)

## 2016-01-24 LAB — COMPREHENSIVE METABOLIC PANEL
ALBUMIN: 2.9 g/dL — AB (ref 3.5–5.0)
ALK PHOS: 642 U/L — AB (ref 40–150)
ALT: 23 U/L (ref 0–55)
AST: 64 U/L — ABNORMAL HIGH (ref 5–34)
Anion Gap: 11 mEq/L (ref 3–11)
BILIRUBIN TOTAL: 1.24 mg/dL — AB (ref 0.20–1.20)
BUN: <4 mg/dL — ABNORMAL LOW (ref 7.0–26.0)
CO2: 28 mEq/L (ref 22–29)
Calcium: 9.9 mg/dL (ref 8.4–10.4)
Chloride: 93 mEq/L — ABNORMAL LOW (ref 98–109)
Creatinine: 0.7 mg/dL (ref 0.6–1.1)
Glucose: 171 mg/dl — ABNORMAL HIGH (ref 70–140)
Potassium: 4 mEq/L (ref 3.5–5.1)
SODIUM: 131 meq/L — AB (ref 136–145)
TOTAL PROTEIN: 9.2 g/dL — AB (ref 6.4–8.3)

## 2016-01-24 NOTE — Telephone Encounter (Signed)
Gave adn pritned appt sched and avs for pt for Sept °

## 2016-01-25 LAB — VITAMIN D 25 HYDROXY (VIT D DEFICIENCY, FRACTURES): VIT D 25 HYDROXY: 100 ng/mL (ref 30.0–100.0)

## 2016-01-27 LAB — MULTIPLE MYELOMA PANEL, SERUM
Albumin SerPl Elph-Mcnc: 3.2 g/dL (ref 2.9–4.4)
Albumin/Glob SerPl: 0.6 — ABNORMAL LOW (ref 0.7–1.7)
Alpha 1: 0.4 g/dL (ref 0.0–0.4)
Alpha2 Glob SerPl Elph-Mcnc: 0.7 g/dL (ref 0.4–1.0)
B-Globulin SerPl Elph-Mcnc: 2.7 g/dL — ABNORMAL HIGH (ref 0.7–1.3)
Gamma Glob SerPl Elph-Mcnc: 1.7 g/dL (ref 0.4–1.8)
Globulin, Total: 5.5 g/dL — ABNORMAL HIGH (ref 2.2–3.9)
IGM (IMMUNOGLOBIN M), SRM: 327 mg/dL — AB (ref 26–217)
IgG, Qn, Serum: 1405 mg/dL (ref 700–1600)
M Protein SerPl Elph-Mcnc: 1.7 g/dL — ABNORMAL HIGH
Total Protein: 8.7 g/dL — ABNORMAL HIGH (ref 6.0–8.5)

## 2016-01-27 LAB — KAPPA/LAMBDA LIGHT CHAINS
IG LAMBDA FREE LIGHT CHAIN: 19.2 mg/L (ref 5.7–26.3)
Ig Kappa Free Light Chain: 60.6 mg/L — ABNORMAL HIGH (ref 3.3–19.4)
Kappa/Lambda FluidC Ratio: 3.16 — ABNORMAL HIGH (ref 0.26–1.65)

## 2016-01-31 NOTE — Progress Notes (Signed)
Marland Kitchen    HEMATOLOGY/ONCOLOGY CLINIC NOTE  Date of Service: 01/31/2016  Patient Care Team: Everardo Beals, NP as PCP - General  Orthopedics - Dr. Layne Benton from [facility]   CHIEF COMPLAINTS/PURPOSE OF CONSULTATION:  Elevated M protein and vertebral compression fracture. Elevated sedimentation rate and total protein.  HISTORY OF PRESENTING ILLNESS:   Cassidy Barrett is a wonderful 70 y.o. female who has been referred to [place] by Dr Wandra Feinstein for evaluation and management of possible plasma cell dyscrasia.  Patient has a history of liver cirrhosis with ascites, alcohol abuse, emphysema, macrocytic anemia, osteoporosis and was seen by orthopedics for spinal vertebral compression fractures in the setting of osteoporosis.  She was noted to have multiple significant lab abnormalities including an elevated alkaline phosphatase and high total protein level.  Chronic appealing L1-L2 compression fractures  Patient subsequently had a bone scan to evaluate these compression fractures on 09/26/2015.  This showed mild increased activity within the superior endplates of L3, L4 and L5 likely representing subacute fractures. Increased activity within the T2 vertebral body compatible with known compression fracture. No evidence of suggest bony metastatic disease.  As a part of her workup she also had an SPEP done which shows 3 monoclonal protein bands for a total M protein of 2.3 g/dL. On IFE this is noted to be monoclonal IgA kappa protein. Her sedimentation rate was also elevated at 73.  She was referred to [place] for further evaluation to rule out multiple myeloma. Patient notes her mid and lower back pain related to her compression fractures. Also notes some left hip pain. Her labs from January 2017 did not show any anemia, hypercalcemia or renal failure.  She notes that she did have a colonoscopy in 2015 which demonstrated a 4.5 cm rectal polyp which was removed piecemeal.  Pathology showed a tubulovillous adenoma. Patient was also noted to have significant diverticulosis and was not recommended routine colonoscopies. Given her CT scan showing concern of rectal mass she was referred back to Dr. Henrene Pastor at [facility] for consideration of a sigmoidoscopy/colonoscopy. Patient notes no overt rectal bleeding. No obstructive symptoms. No change in bowel habits. No new abdominal pain. She also needs to connect with gi for management of her liver cirrhosis and related issues.   INTERVAL HISTORY  Patient is here for her scheduled followup for smoldering myeloma. She arrives with a prescription from her orthopedic doctor Layne Benton to start her on a bisphosphonate. Patient however has claimed that she had dental issues related to previous Fosamax use.  She isn't able to provide much detail regarding this.   She has fairly bad teeth given at this point.  We discussed the risks of using bisphosphonate therapy.   Since at this time the treatment with bisphosphonates is for osteoporosis and no myeloma we discussed that she has some time to think about treatment options.  We discussed that at the very least she needs to see a dentist and be cleared to start bisphosphonates and then sure that no immediate dental work is required.   We discussed that bisphosphonates typically need to be held for 12 weeks before and after any significant dental intervention.  Patient notes no new back pains or other bone pains.  She is accompanied by her friend for this visit. We did not receive any information from her primary care physician regarding issues with the Fosamax.  We had sent a request for this information.   MEDICAL HISTORY:  Past Medical History  Diagnosis  Date  . Osteoporosis   . Alcohol abuse   . Cirrhosis ([place])   . Ascites   . Emphysema of lung ([place])   . Cholelithiasis   . Compression fracture of lumbar vertebra ([facility])      L1 and L2  . Atherosclerosis of aorta ([place])   . Macrocytic  anemia   . Complication of anesthesia     Difficult time waking up after having biospy with colonoscopy  . Pneumonia 04/2014  . Arthritis    ?? 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  SURGICAL HISTORY: Past Surgical History  Procedure Laterality Date  . Tonsillectomy    . Flexible sigmoidoscopy N/A 05/21/2014    Procedure: FLEXIBLE SIGMOIDOSCOPY;  Surgeon: Gatha Mayer, MD;  Location: [facility];  Service: Endoscopy;  Laterality: N/A;  . Colonoscopy N/A 05/24/2014    Procedure: COLONOSCOPY;  Surgeon: Gatha Mayer, MD;  Location[facility];  Service: Endoscopy;  Laterality: N/A;  MAC if possible ultraslim colonoscope and gastroscope needed  . Mandible fracture surgery      X 2  . Kyphoplasty N/A 12/12/2014    Procedure: KYPHOPLASTY;  Surgeon: Phylliss Bob, MD;  Location: [facility];  Service: Orthopedics;  Laterality: N/A;  T10 kyphoplasty    SOCIAL HISTORY: Social History   Social History  . Marital Status: Divorced    Spouse Name: N/A  . Number of Children: N/A  . Years of Education: N/A   Occupational History  . Not on file.   Social History Main Topics  . Smoking status: Current Every Day Smoker -- 0.25 packs/day for 46 years    Types: Cigarettes    Last Attempt to Quit: 05/14/2014  . Smokeless tobacco: Never Used  . Alcohol Use: 0.0 oz/week    0 Standard drinks or equivalent per week     Comment: occasional  . Drug Use: No  . Sexual Activity: Not on file   Other Topics Concern  . Not on file   Social History Narrative   Patient notes that she is drinking about 1 beer and 1 hard liquor every day - was counseled regarding absolute alcohol abstinence.  FAMILY HISTORY:  No family history of colon cancer.  ALLERGIES:  is allergic to oxycodone; tape; and valium.  MEDICATIONS:  Current Outpatient Prescriptions  Medication Sig Dispense Refill  . Calcium Citrate-Vitamin D (CALCIUM + D PO) Take by mouth.    . cyclobenzaprine  (FLEXERIL) 5 MG tablet Take 5 mg by mouth 3 (three) times daily as needed for muscle spasms.    . Multiple Vitamins-Minerals (MULTIVITAMIN WITH MINERALS) tablet Take 1 tablet by mouth daily.    . TRAMADOL HCL PO Take by mouth as needed.    . Vitamin D, Ergocalciferol, (DRISDOL) 50000 UNITS CAPS capsule Take 5,000 Units by mouth daily.      No current facility-administered medications for this visit.    REVIEW OF SYSTEMS:    10 Point review of Systems was done is negative except as noted above.  PHYSICAL EXAMINATION: ECOG PERFORMANCE STATUS: 2 - Symptomatic, <50% confined to bed  . Filed Vitals:   01/24/16 1056  BP: 132/86  Pulse: 122  Temp: 98.1 F (36.7 C)  Resp: 17   Filed Weights   01/24/16 1056  Weight: 83 lb 14.4 oz (38.057 kg)   .Body mass index is 16.39 kg/(m^2).  GENERAL:alert, in no acute distress and comfortable SKIN:No acute rashes  EYES: normal, conjunctiva are pink and non-injected, sclera clear OROPHARYNX:no exudate,  no erythema and lips, buccal mucosa, and tongue normal  NECK: supple, no JVD, thyroid normal size, non-tender, without nodularity LYMPH:  no palpable lymphadenopathy in the cervical, axillary or inguinal LUNGS: clear to auscultation with normal respiratory effort HEART: regular rate & rhythm,  no murmurs and no lower extremity edema ABDOMEN: abdomen soft, non-tender, normoactive bowel sounds , no palpable hepatosplenomegaly Musculoskeletal: No pedal edema PSYCH: alert & oriented x 3 with fluent speech NEURO: no focal motor/sensory deficits  LABORATORY DATA:  I have reviewed the data as listed  . CBC Latest Ref Rng & Units 01/24/2016 10/24/2015 10/16/2015  WBC 3.9 - 10.3 10e3/uL 8.6 6.3 6.8  Hemoglobin 11.6 - 15.9 g/dL 13.7 14.6 14.1  Hematocrit 34.8 - 46.6 % 38.3 43.2 41.2  Platelets 145 - 400 10e3/uL 259 266 273    . CMP Latest Ref Rng & Units 01/24/2016 01/24/2016 10/24/2015  Glucose 70 - 140 mg/dl 171(H) - 129  BUN 7.0 - 26.0 mg/dL  <4.0(L) - <4.0(L)  Creatinine 0.6 - 1.1 mg/dL 0.7 - 0.7  Sodium 136 - 145 mEq/L 131(L) - 134(L)  Potassium 3.5 - 5.1 mEq/L 4.0 - 4.2  Chloride 101 - 111 mmol/L - - -  CO2 22 - 29 mEq/L 28 - 31(H)  Calcium 8.4 - 10.4 mg/dL 9.9 - 9.4  Total Protein 6.0 - 8.5 g/dL 9.2(H) 8.7(H) 8.9(H)  Total Bilirubin 0.20 - 1.20 mg/dL 1.24(H) - 0.87  Alkaline Phos 40 - 150 U/L 642(H) - 469(H)  AST 5 - 34 U/L 64(H) - 52(H)  ALT 0 - 55 U/L 23 - 21      RADIOGRAPHIC STUDIES: I have personally reviewed the radiological images as listed and agreed with the findings in the report. No results found.   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   Patient is here for her 4 month follow.  M protein has increased from 1.5 to 1.7 Labs with no signs of anemia, hypercalcemia or renal failure. Bm Bx shows 21% abnormal plasma cells. Plan -no clear indication of progression to multiple myeloma at this time. -hold off on definitive treatments for multiple myeloma unless there is any overt evidence of end organ injury meeting criteria for multiple myeloma. -Return to care with Dr. Irene Limbo in 3 months with repeat CBC, CMP and myeloma panel.  #2 liver cirrhosis with known varices Plan -Counseled on absolute alcohol cessation. -continue f/u with PCP  #3 osteoporosis with vertebral compression fractures.  #4 ?? history of osteonecrosis of the jaw with Fosamax/bisphosphonates previously . Unclear if this truly represents ONJ ---patient decribes it as "having brittle teeth" Plan  -Patient has been following with orthopedics Dr. Wandra Feinstein to manage her osteoporosis . -continue aggressive vitamin D replacement . -patient notes that she has a dentist that she sees.  She was  strongly recommended to see the dentist for clearance prior to consideration off bisphosphonate or Prolia ggiven her previous history as well as current poor dental status. -Her primary care physician who has information regarding her previous dental issues Could certainly proceed to give the patient Prolia as per her own judgment. -we also discussed the option that the patient could consult endocrinology to discuss other treatments for her osteoporosis including possibly considering teriparitide.  RTC with Dr Irene Limbo in  3 months with rpt labs.  All of the patients and her female partners questions were answered to their apparent satisfaction. The patient knows to call the clinic with any problems, questions or concerns.  I spent 20 minutes counseling the patient face to face. The total time spent in the appointment was 25 minutes and more than 50% was on counseling and direct patient cares.    Sullivan Lone MD [place] AAHIVMS [facility] Hematology/Oncology Physician [facility]  (Office):       [phone] (Work cell):  [phone] (Fax):           [phone]

## 2016-02-13 ENCOUNTER — Other Ambulatory Visit: Payer: Self-pay | Admitting: Internal Medicine

## 2016-02-13 DIAGNOSIS — Z1231 Encounter for screening mammogram for malignant neoplasm of breast: Secondary | ICD-10-CM

## 2016-02-13 DIAGNOSIS — R932 Abnormal findings on diagnostic imaging of liver and biliary tract: Secondary | ICD-10-CM | POA: Diagnosis not present

## 2016-02-26 ENCOUNTER — Ambulatory Visit
Admission: RE | Admit: 2016-02-26 | Discharge: 2016-02-26 | Disposition: A | Discharge status: Home / Self Care | Payer: Medicare Other | Source: Ambulatory Visit | Attending: Internal Medicine | Admitting: Internal Medicine

## 2016-02-26 DIAGNOSIS — Z1231 Encounter for screening mammogram for malignant neoplasm of breast: Secondary | ICD-10-CM | POA: Diagnosis not present

## 2016-03-10 ENCOUNTER — Other Ambulatory Visit: Payer: Self-pay | Admitting: Obstetrics & Gynecology

## 2016-03-10 ENCOUNTER — Other Ambulatory Visit (HOSPITAL_COMMUNITY)
Admission: RE | Admit: 2016-03-10 | Discharge: 2016-03-10 | Disposition: A | Discharge status: Home / Self Care | Payer: Medicare Other | Source: Ambulatory Visit | Attending: Obstetrics & Gynecology | Admitting: Obstetrics & Gynecology

## 2016-03-10 DIAGNOSIS — Z1151 Encounter for screening for human papillomavirus (HPV): Secondary | ICD-10-CM | POA: Insufficient documentation

## 2016-03-10 DIAGNOSIS — Z01419 Encounter for gynecological examination (general) (routine) without abnormal findings: Secondary | ICD-10-CM | POA: Insufficient documentation

## 2016-03-10 DIAGNOSIS — N952 Postmenopausal atrophic vaginitis: Secondary | ICD-10-CM | POA: Diagnosis not present

## 2016-03-13 LAB — CYTOLOGY - PAP

## 2016-04-15 ENCOUNTER — Telehealth: Payer: Self-pay | Admitting: Hematology

## 2016-04-15 NOTE — Telephone Encounter (Signed)
Rescheduled 09/21 Appointment to 10/16 per patient request. Pt. Requested appointment after 10/13.

## 2016-04-16 ENCOUNTER — Ambulatory Visit: Payer: Self-pay | Admitting: Hematology

## 2016-04-16 ENCOUNTER — Other Ambulatory Visit: Payer: Self-pay

## 2016-04-16 DIAGNOSIS — M5489 Other dorsalgia: Secondary | ICD-10-CM | POA: Diagnosis not present

## 2016-04-28 ENCOUNTER — Ambulatory Visit: Payer: Self-pay | Admitting: Internal Medicine

## 2016-05-08 ENCOUNTER — Other Ambulatory Visit: Payer: Self-pay | Admitting: Internal Medicine

## 2016-05-08 ENCOUNTER — Other Ambulatory Visit: Payer: Self-pay | Admitting: *Deleted

## 2016-05-08 DIAGNOSIS — Z122 Encounter for screening for malignant neoplasm of respiratory organs: Secondary | ICD-10-CM

## 2016-05-11 ENCOUNTER — Ambulatory Visit (HOSPITAL_BASED_OUTPATIENT_CLINIC_OR_DEPARTMENT_OTHER): Payer: Medicare Other | Admitting: Hematology

## 2016-05-11 ENCOUNTER — Other Ambulatory Visit: Payer: Self-pay | Admitting: *Deleted

## 2016-05-11 ENCOUNTER — Encounter: Payer: Self-pay | Admitting: Hematology

## 2016-05-11 ENCOUNTER — Telehealth: Payer: Self-pay | Admitting: Hematology

## 2016-05-11 ENCOUNTER — Other Ambulatory Visit (HOSPITAL_BASED_OUTPATIENT_CLINIC_OR_DEPARTMENT_OTHER): Payer: Medicare Other

## 2016-05-11 VITALS — BP 133/67 | HR 116 | Temp 98.0°F | Resp 18 | Ht 60.0 in | Wt 85.1 lb

## 2016-05-11 DIAGNOSIS — D472 Monoclonal gammopathy: Secondary | ICD-10-CM

## 2016-05-11 DIAGNOSIS — M818 Other osteoporosis without current pathological fracture: Secondary | ICD-10-CM

## 2016-05-11 DIAGNOSIS — K7460 Unspecified cirrhosis of liver: Secondary | ICD-10-CM

## 2016-05-11 DIAGNOSIS — C9000 Multiple myeloma not having achieved remission: Secondary | ICD-10-CM | POA: Diagnosis not present

## 2016-05-11 LAB — CBC & DIFF AND RETIC
BASO%: 0.6 % (ref 0.0–2.0)
BASOS ABS: 0.1 10*3/uL (ref 0.0–0.1)
EOS ABS: 0.2 10*3/uL (ref 0.0–0.5)
EOS%: 1.9 % (ref 0.0–7.0)
HEMATOCRIT: 38.8 % (ref 34.8–46.6)
HGB: 13.5 g/dL (ref 11.6–15.9)
Immature Retic Fract: 2.1 % (ref 1.60–10.00)
LYMPH#: 1.4 10*3/uL (ref 0.9–3.3)
LYMPH%: 16.3 % (ref 14.0–49.7)
MCH: 35.4 pg — ABNORMAL HIGH (ref 25.1–34.0)
MCHC: 34.8 g/dL (ref 31.5–36.0)
MCV: 101.8 fL — ABNORMAL HIGH (ref 79.5–101.0)
MONO#: 1 10*3/uL — ABNORMAL HIGH (ref 0.1–0.9)
MONO%: 11.3 % (ref 0.0–14.0)
NEUT%: 69.9 % (ref 38.4–76.8)
NEUTROS ABS: 6 10*3/uL (ref 1.5–6.5)
Platelets: 237 10*3/uL (ref 145–400)
RBC: 3.81 10*6/uL (ref 3.70–5.45)
RDW: 13 % (ref 11.2–14.5)
RETIC %: 1.25 % (ref 0.70–2.10)
RETIC CT ABS: 47.63 10*3/uL (ref 33.70–90.70)
WBC: 8.6 10*3/uL (ref 3.9–10.3)

## 2016-05-11 LAB — COMPREHENSIVE METABOLIC PANEL
ALK PHOS: 580 U/L — AB (ref 40–150)
ALT: 16 U/L (ref 0–55)
ANION GAP: 11 meq/L (ref 3–11)
AST: 51 U/L — AB (ref 5–34)
Albumin: 3.1 g/dL — ABNORMAL LOW (ref 3.5–5.0)
BILIRUBIN TOTAL: 1.09 mg/dL (ref 0.20–1.20)
BUN: 5 mg/dL — ABNORMAL LOW (ref 7.0–26.0)
CALCIUM: 9.5 mg/dL (ref 8.4–10.4)
CO2: 28 mEq/L (ref 22–29)
Chloride: 95 mEq/L — ABNORMAL LOW (ref 98–109)
Creatinine: 0.6 mg/dL (ref 0.6–1.1)
Glucose: 103 mg/dl (ref 70–140)
POTASSIUM: 4.2 meq/L (ref 3.5–5.1)
SODIUM: 133 meq/L — AB (ref 136–145)
Total Protein: 8.8 g/dL — ABNORMAL HIGH (ref 6.4–8.3)

## 2016-05-11 NOTE — Telephone Encounter (Signed)
Patient wants to have Bone survey done on 05/12/16. S/W Mandy @ ext [phone], regarding scheduling an appointment for the Bone Survey and was told that no appointment was required and that patient can walk in! Patient said that she will go on  05/12/16. Lab and follow up appointment scheduled, per 05/11/16 los.  AVS report and appointment schedule given per 05/11/16 los.

## 2016-05-12 ENCOUNTER — Ambulatory Visit (HOSPITAL_COMMUNITY)
Admission: RE | Admit: 2016-05-12 | Discharge: 2016-05-12 | Disposition: A | Discharge status: Home / Self Care | Payer: Medicare Other | Source: Ambulatory Visit | Attending: Hematology | Admitting: Hematology

## 2016-05-12 DIAGNOSIS — I700 Atherosclerosis of aorta: Secondary | ICD-10-CM | POA: Insufficient documentation

## 2016-05-12 DIAGNOSIS — C9000 Multiple myeloma not having achieved remission: Secondary | ICD-10-CM | POA: Insufficient documentation

## 2016-05-12 DIAGNOSIS — M47892 Other spondylosis, cervical region: Secondary | ICD-10-CM | POA: Diagnosis not present

## 2016-05-12 DIAGNOSIS — D472 Monoclonal gammopathy: Secondary | ICD-10-CM

## 2016-05-12 LAB — KAPPA/LAMBDA LIGHT CHAINS
Ig Kappa Free Light Chain: 59.8 mg/L — ABNORMAL HIGH (ref 3.3–19.4)
Ig Lambda Free Light Chain: 17.2 mg/L (ref 5.7–26.3)
KAPPA/LAMBDA FLC RATIO: 3.48 — AB (ref 0.26–1.65)

## 2016-05-13 DIAGNOSIS — S32050A Wedge compression fracture of fifth lumbar vertebra, initial encounter for closed fracture: Secondary | ICD-10-CM | POA: Diagnosis not present

## 2016-05-13 DIAGNOSIS — M810 Age-related osteoporosis without current pathological fracture: Secondary | ICD-10-CM | POA: Diagnosis not present

## 2016-05-14 LAB — MULTIPLE MYELOMA PANEL, SERUM
ALPHA 1: 0.3 g/dL (ref 0.0–0.4)
ALPHA2 GLOB SERPL ELPH-MCNC: 0.6 g/dL (ref 0.4–1.0)
Albumin SerPl Elph-Mcnc: 3.6 g/dL (ref 2.9–4.4)
Albumin/Glob SerPl: 0.8 (ref 0.7–1.7)
B-Globulin SerPl Elph-Mcnc: 2.4 g/dL — ABNORMAL HIGH (ref 0.7–1.3)
Gamma Glob SerPl Elph-Mcnc: 1.3 g/dL (ref 0.4–1.8)
Globulin, Total: 4.7 g/dL — ABNORMAL HIGH (ref 2.2–3.9)
IGM (IMMUNOGLOBIN M), SRM: 238 mg/dL — AB (ref 26–217)
IgA, Qn, Serum: 2238 mg/dL — ABNORMAL HIGH (ref 87–352)
M Protein SerPl Elph-Mcnc: 1.4 g/dL — ABNORMAL HIGH
TOTAL PROTEIN: 8.3 g/dL (ref 6.0–8.5)

## 2016-05-18 ENCOUNTER — Telehealth: Payer: Self-pay | Admitting: *Deleted

## 2016-05-18 NOTE — Telephone Encounter (Signed)
Patient called back and RN informed her of results. Patient states that she is still trying to get her dental appointment set up and she will call us once she is given clearance.

## 2016-05-18 NOTE — Telephone Encounter (Signed)
-----  Message from Brunetta Genera, MD sent at 05/16/2016  9:09 AM EDT ----- Regarding: RE: Bone Survey No obvious lytic lesions suggest of multiple myeloma.. Several chronic compression fractures and a possible new compression fracture at L5. --f/u with Dr Layne Benton . -needs dental clearance to consider Prolia /Bisphosphonates.  See [place] after dental clearance.  Thx GK  ----- Message ----- From: Ronnette Juniper, RN Sent: 05/14/2016   4:13 PM To: Brunetta Genera, MD Subject: [place],    When you get time, can you check out this patients bone survey that was done yesterday and let me know if I need to tell her anything.  Thanks [place]

## 2016-05-18 NOTE — Telephone Encounter (Signed)
Called to inform patient. No answer. Left voicemail for patient to call [facility]

## 2016-05-21 ENCOUNTER — Ambulatory Visit
Admission: RE | Admit: 2016-05-21 | Discharge: 2016-05-21 | Disposition: A | Discharge status: Home / Self Care | Payer: Medicare Other | Source: Ambulatory Visit | Attending: Internal Medicine | Admitting: Internal Medicine

## 2016-05-21 DIAGNOSIS — Z122 Encounter for screening for malignant neoplasm of respiratory organs: Secondary | ICD-10-CM

## 2016-05-21 DIAGNOSIS — Z87891 Personal history of nicotine dependence: Secondary | ICD-10-CM | POA: Diagnosis not present

## 2016-07-02 ENCOUNTER — Ambulatory Visit: Payer: Self-pay | Admitting: Internal Medicine

## 2016-07-24 NOTE — Progress Notes (Signed)
Marland Kitchen    HEMATOLOGY/ONCOLOGY CLINIC NOTE  Date of Service: .05/11/2016  Patient Care Team: Everardo Beals, NP as PCP - General  Orthopedics - Dr. Layne Benton from [facility]   CHIEF COMPLAINTS/PURPOSE OF CONSULTATION:  F/u for Smoldering Multiple myeloma.  HISTORY OF PRESENTING ILLNESS:   TAZIAH Barrett is a wonderful 70 y.o. female who has been referred to [place] by Dr Wandra Feinstein for evaluation and management of possible plasma cell dyscrasia.  Patient has a history of liver cirrhosis with ascites, alcohol abuse, emphysema, macrocytic anemia, osteoporosis and was seen by orthopedics for spinal vertebral compression fractures in the setting of osteoporosis.  She was noted to have multiple significant lab abnormalities including an elevated alkaline phosphatase and high total protein level.  Chronic appealing L1-L2 compression fractures  Patient subsequently had a bone scan to evaluate these compression fractures on 09/26/2015.  This showed mild increased activity within the superior endplates of L3, L4 and L5 likely representing subacute fractures. Increased activity within the T2 vertebral body compatible with known compression fracture. No evidence of suggest bony metastatic disease.  As a part of her workup she also had an SPEP done which shows 3 monoclonal protein bands for a total M protein of 2.3 g/dL. On IFE this is noted to be monoclonal IgA kappa protein. Her sedimentation rate was also elevated at 73.  She was referred to [place] for further evaluation to rule out multiple myeloma. Patient notes her mid and lower back pain related to her compression fractures. Also notes some left hip pain. Her labs from January 2017 did not show any anemia, hypercalcemia or renal failure.  She notes that she did have a colonoscopy in 2015 which demonstrated a 4.5 cm rectal polyp which was removed piecemeal. Pathology showed a tubulovillous adenoma. Patient was also noted  to have significant diverticulosis and was not recommended routine colonoscopies. Given her CT scan showing concern of rectal mass she was referred back to Dr. Henrene Pastor at [facility] for consideration of a sigmoidoscopy/colonoscopy. Patient notes no overt rectal bleeding. No obstructive symptoms. No change in bowel habits. No new abdominal pain. She also needs to connect with gi for management of her liver cirrhosis and related issues.   INTERVAL HISTORY  Patient is here for her scheduled followup for smoldering myeloma. She has not had her dental evaluation and clearance yet for bisphosphonate use for osteoporosis with fractures for which she sees Dr Layne Benton. She notes no new bone pain. No anemia  MEDICAL HISTORY:  Past Medical History:  Diagnosis Date  . Alcohol abuse   . Arthritis   . Ascites   . Atherosclerosis of aorta ([place])   . Cholelithiasis   . Cirrhosis ([place])   . Complication of anesthesia    Difficult time waking up after having biospy with colonoscopy  . Compression fracture of lumbar vertebra (H[facility])     L1 and L2  . Emphysema of lung ([place])   . History of multiple myeloma    smoldering  . Hyponatremia   . Macrocytic anemia   . 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 Osteoporosis   . Pneumonia 04/2014  . Tobacco abuse   . Tubular adenoma   . Vertebral compression fracture (H[facility])    ?? 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  SURGICAL HISTORY: Past Surgical History:  Procedure Laterality Date  . COLONOSCOPY N/A 05/24/2014   Procedure: COLONOSCOPY;  Surgeon: Gatha Mayer, MD;  Location: [facility];  Service: Endoscopy;  Laterality: N/A;  MAC if possible ultraslim colonoscope and gastroscope needed  . FLEXIBLE SIGMOIDOSCOPY N/A 05/21/2014   Procedure: FLEXIBLE SIGMOIDOSCOPY;  Surgeon: Gatha Mayer, MD;  [facility];  Service: Endoscopy;  Laterality: N/A;  . KYPHOPLASTY N/A 12/12/2014   Procedure: KYPHOPLASTY;  Surgeon: Phylliss Bob, MD;  Location: [facility];  Service: Orthopedics;  Laterality: N/A;  T10 kyphoplasty  . MANDIBLE FRACTURE SURGERY     X 2  . TONSILLECTOMY      SOCIAL HISTORY: Social History   Social History  . Marital status: Married    Spouse name: N/A  . Number of children: N/A  . Years of education: N/A   Occupational History  . retired -[employer]    Social History Main Topics  . Smoking status: Current Every Day Smoker    Packs/day: 0.25    Years: 46.00    Types: Cigarettes    Last attempt to quit: 05/14/2014  . Smokeless tobacco: Never Used  . Alcohol use 0.0 oz/week     Comment: occasional  . Drug use: No  . Sexual activity: Not on file   Other Topics Concern  . Not on file   Social History Narrative  . No narrative on file   Patient notes that she is drinking about 1 beer and 1 hard liquor every day - was counseled regarding absolute alcohol abstinence.  FAMILY HISTORY:  No family history of colon cancer.  ALLERGIES:  is allergic to oxycodone; tape; and valium [diazepam].  MEDICATIONS:  Current Outpatient Prescriptions  Medication Sig Dispense Refill  . Calcium Citrate-Vitamin D (CALCIUM + D PO) Take by mouth.    . cyclobenzaprine (FLEXERIL) 5 MG tablet Take 5 mg by mouth 3 (three) times daily as needed for muscle spasms.    . Multiple Vitamins-Minerals (MULTIVITAMIN WITH MINERALS) tablet Take 1 tablet by mouth daily.    . TRAMADOL HCL PO Take by mouth as needed.    . Vitamin D, Ergocalciferol, (DRISDOL) 50000 UNITS CAPS capsule Take 5,000 Units by mouth daily.      No current facility-administered medications for this visit.     REVIEW OF SYSTEMS:    10 Point review of Systems was done is negative except as noted above.  PHYSICAL EXAMINATION: ECOG PERFORMANCE STATUS: 2 - Symptomatic, <50% confined to bed  . Vitals:   05/11/16 1422  BP: 133/67  Pulse: (!) 116  Resp: 18  Temp: 98 F (36.7 C)   Filed Weights   05/11/16 1422  Weight: 85 lb  1.6 oz (38.6 kg)   .Body mass index is 16.62 kg/m.  GENERAL:alert, in no acute distress and comfortable SKIN:No acute rashes  EYES: normal, conjunctiva are pink and non-injected, sclera clear OROPHARYNX:no exudate, no erythema and lips, buccal mucosa, and tongue normal  NECK: supple, no JVD, thyroid normal size, non-tender, without nodularity LYMPH:  no palpable lymphadenopathy in the cervical, axillary or inguinal LUNGS: clear to auscultation with normal respiratory effort HEART: regular rate & rhythm,  no murmurs and no lower extremity edema ABDOMEN: abdomen soft, non-tender, normoactive bowel sounds , no palpable hepatosplenomegaly Musculoskeletal: No pedal edema PSYCH: alert & oriented x 3 with fluent speech NEURO: no focal motor/sensory deficits  LABORATORY DATA:  I have reviewed the data as listed  . CBC Latest Ref Rng & Units 05/11/2016 01/24/2016 10/24/2015  WBC 3.9 - 10.3 10e3/uL 8.6 8.6 6.3  Hemoglobin 11.6 - 15.9 g/dL 13.5 13.7 14.6  Hematocrit 34.8 - 46.6 % 38.8 38.3 43.2  Platelets 145 - 400 10e3/uL 237 259 266    . CMP Latest Ref Rng & Units 05/11/2016 05/11/2016 01/24/2016  Glucose 70 - 140 mg/dl 103 - 171(H)  BUN 7.0 - 26.0 mg/dL 5.0(L) - <4.0(L)  Creatinine 0.6 - 1.1 mg/dL 0.6 - 0.7  Sodium 136 - 145 mEq/L 133(L) - 131(L)  Potassium 3.5 - 5.1 mEq/L 4.2 - 4.0  Chloride 101 - 111 mmol/L - - -  CO2 22 - 29 mEq/L 28 - 28  Calcium 8.4 - 10.4 mg/dL 9.5 - 9.9  Total Protein 6.0 - 8.5 g/dL 8.8(H) 8.3 9.2(H)  Total Bilirubin 0.20 - 1.20 mg/dL 1.09 - 1.24(H)  Alkaline Phos 40 - 150 U/L 580(H) - 642(H)  AST 5 - 34 U/L 51(H) - 64(H)  ALT 0 - 55 U/L 16 - 23        RADIOGRAPHIC STUDIES: I have personally reviewed the radiological images as listed and agreed with the findings in the report. No results found.   ASSESSMENT & PLAN:   70 year old Caucasian female with severe osteoporosis and vertebral compression fractures  #1 IgA kappa Smoldering Multiple  myeloma with M protein level was about 2.3 g/dL (outside lab) rpt in our lab 1.5g/dl recent bone scans does not show obvious bone tumors.   Patient is here for her 4 month follow.  M protein has decreased from 1.7 to 1.4 Labs with no signs of anemia, hypercalcemia or renal failure. Bm Bx shows 21% abnormal plasma cells. Rpt Bone Survey on 05/12/2016 -- showed no evidence of lytic lesions. Plan -no clear indication of progression to multiple myeloma at this time. -hold off on definitive treatments for multiple myeloma unless there is any overt evidence of end organ injury meeting criteria for multiple myeloma. -Return to care with Dr. Irene Limbo in 3 months with repeat CBC, CMP and myeloma panel.  #2 liver cirrhosis with known varices Plan -Counseled on absolute alcohol cessation. -continue f/u with PCP  #3 osteoporosis with vertebral compression fractures.  #4 ?? history of osteonecrosis of the jaw with Fosamax/bisphosphonates previously . Unclear if this truly represents ONJ ---patient decribes it as "having brittle teeth" Plan  -Patient has been following with orthopedics Dr. Wandra Feinstein to manage her osteoporosis . -continue aggressive vitamin D replacement . -needs dental clearance prior to use of Bisphosphonates for osteoporosis -we also discussed the option that the patient could consult endocrinology to discuss other treatments for her osteoporosis including possibly considering teriparitide. (she needs to discuss this with her PCP)  RTC with Dr Irene Limbo in 3 months with rpt labs.  All of the patients and her female partners questions were answered to their apparent satisfaction. The patient knows to call the clinic with any problems, questions or concerns.  I spent 20 minutes counseling the patient face to face. The total time spent in the appointment was 25 minutes and more than 50% was on counseling and direct patient cares.    Sullivan Lone MD [place] AAHIVMS [facility]  [facility] Hematology/Oncology Physician [facility]  (Office):       [phone] (Work cell):  [phone] (Fax):           [phone]

## 2016-08-13 ENCOUNTER — Telehealth: Payer: Self-pay | Admitting: Hematology

## 2016-08-13 NOTE — Telephone Encounter (Signed)
Patient called to cancel appointment for 1/19 and will call back to reschedule.  She is unable to get here due to no ride because of the inclement weather

## 2016-08-14 ENCOUNTER — Ambulatory Visit: Payer: Self-pay | Admitting: Hematology

## 2016-08-14 ENCOUNTER — Other Ambulatory Visit: Payer: Self-pay

## 2016-08-31 ENCOUNTER — Telehealth: Payer: Self-pay | Admitting: Hematology

## 2016-08-31 NOTE — Telephone Encounter (Signed)
lvm to inform pt of 2/12 appt date/time per LOS

## 2016-09-07 ENCOUNTER — Ambulatory Visit: Payer: Self-pay | Admitting: Hematology

## 2016-09-07 ENCOUNTER — Other Ambulatory Visit: Payer: Self-pay

## 2016-09-17 DIAGNOSIS — R7301 Impaired fasting glucose: Secondary | ICD-10-CM | POA: Diagnosis not present

## 2016-09-17 DIAGNOSIS — K7030 Alcoholic cirrhosis of liver without ascites: Secondary | ICD-10-CM | POA: Diagnosis not present

## 2016-09-24 ENCOUNTER — Other Ambulatory Visit (HOSPITAL_BASED_OUTPATIENT_CLINIC_OR_DEPARTMENT_OTHER): Payer: Medicare Other

## 2016-09-24 ENCOUNTER — Telehealth: Payer: Self-pay | Admitting: Hematology

## 2016-09-24 ENCOUNTER — Encounter: Payer: Self-pay | Admitting: Hematology

## 2016-09-24 ENCOUNTER — Ambulatory Visit (HOSPITAL_BASED_OUTPATIENT_CLINIC_OR_DEPARTMENT_OTHER): Payer: Medicare Other | Admitting: Hematology

## 2016-09-24 VITALS — BP 137/69 | HR 114 | Temp 98.1°F | Resp 18 | Wt 82.7 lb

## 2016-09-24 DIAGNOSIS — D472 Monoclonal gammopathy: Secondary | ICD-10-CM

## 2016-09-24 DIAGNOSIS — K7031 Alcoholic cirrhosis of liver with ascites: Secondary | ICD-10-CM

## 2016-09-24 DIAGNOSIS — C9000 Multiple myeloma not having achieved remission: Secondary | ICD-10-CM

## 2016-09-24 DIAGNOSIS — M8088XS Other osteoporosis with current pathological fracture, vertebra(e), sequela: Secondary | ICD-10-CM | POA: Diagnosis not present

## 2016-09-24 DIAGNOSIS — F1010 Alcohol abuse, uncomplicated: Secondary | ICD-10-CM | POA: Diagnosis not present

## 2016-09-24 LAB — CBC & DIFF AND RETIC
BASO%: 0.6 % (ref 0.0–2.0)
BASOS ABS: 0 10*3/uL (ref 0.0–0.1)
EOS%: 1.8 % (ref 0.0–7.0)
Eosinophils Absolute: 0.1 10*3/uL (ref 0.0–0.5)
HEMATOCRIT: 40.5 % (ref 34.8–46.6)
HGB: 14 g/dL (ref 11.6–15.9)
Immature Retic Fract: 1.6 % (ref 1.60–10.00)
LYMPH%: 12.5 % — AB (ref 14.0–49.7)
MCH: 34.8 pg — ABNORMAL HIGH (ref 25.1–34.0)
MCHC: 34.6 g/dL (ref 31.5–36.0)
MCV: 100.7 fL (ref 79.5–101.0)
MONO#: 0.9 10*3/uL (ref 0.1–0.9)
MONO%: 12.2 % (ref 0.0–14.0)
NEUT#: 5.2 10*3/uL (ref 1.5–6.5)
NEUT%: 72.9 % (ref 38.4–76.8)
PLATELETS: 280 10*3/uL (ref 145–400)
RBC: 4.02 10*6/uL (ref 3.70–5.45)
RDW: 13.5 % (ref 11.2–14.5)
Retic %: 1.71 % (ref 0.70–2.10)
Retic Ct Abs: 68.74 10*3/uL (ref 33.70–90.70)
WBC: 7.1 10*3/uL (ref 3.9–10.3)
lymph#: 0.9 10*3/uL (ref 0.9–3.3)

## 2016-09-24 LAB — COMPREHENSIVE METABOLIC PANEL
ALT: 17 U/L (ref 0–55)
ANION GAP: 10 meq/L (ref 3–11)
AST: 58 U/L — ABNORMAL HIGH (ref 5–34)
Albumin: 3.2 g/dL — ABNORMAL LOW (ref 3.5–5.0)
Alkaline Phosphatase: 611 U/L — ABNORMAL HIGH (ref 40–150)
BILIRUBIN TOTAL: 0.94 mg/dL (ref 0.20–1.20)
BUN: 4.3 mg/dL — ABNORMAL LOW (ref 7.0–26.0)
CHLORIDE: 96 meq/L — AB (ref 98–109)
CO2: 29 meq/L (ref 22–29)
CREATININE: 0.6 mg/dL (ref 0.6–1.1)
Calcium: 9.6 mg/dL (ref 8.4–10.4)
Glucose: 114 mg/dl (ref 70–140)
Potassium: 4.3 mEq/L (ref 3.5–5.1)
Sodium: 135 mEq/L — ABNORMAL LOW (ref 136–145)
TOTAL PROTEIN: 9.2 g/dL — AB (ref 6.4–8.3)

## 2016-09-24 NOTE — Telephone Encounter (Signed)
Gave patient avs report and appointments for June.  °

## 2016-09-25 LAB — KAPPA/LAMBDA LIGHT CHAINS
IG KAPPA FREE LIGHT CHAIN: 54.7 mg/L — AB (ref 3.3–19.4)
IG LAMBDA FREE LIGHT CHAIN: 16.5 mg/L (ref 5.7–26.3)
KAPPA/LAMBDA FLC RATIO: 3.32 — AB (ref 0.26–1.65)

## 2016-09-27 NOTE — Progress Notes (Signed)
Marland Kitchen    HEMATOLOGY/ONCOLOGY CLINIC NOTE  Date of Service: .09/24/2016  Patient Care Team: Everardo Beals, NP as PCP - Clay, DDS as Dietitian (Periodontics)  Orthopedics - Dr. Layne Benton from [facility]   CHIEF COMPLAINTS/PURPOSE OF CONSULTATION:  F/u for Smoldering Multiple myeloma.  HISTORY OF PRESENTING ILLNESS:   Cassidy Barrett is a wonderful 71 y.o. female who has been referred to [place] by Dr Wandra Feinstein for evaluation and management of possible plasma cell dyscrasia.  Patient has a history of liver cirrhosis with ascites, alcohol abuse, emphysema, macrocytic anemia, osteoporosis and was seen by orthopedics for spinal vertebral compression fractures in the setting of osteoporosis.  She was noted to have multiple significant lab abnormalities including an elevated alkaline phosphatase and high total protein level.  Chronic appealing L1-L2 compression fractures  Patient subsequently had a bone scan to evaluate these compression fractures on 09/26/2015.  This showed mild increased activity within the superior endplates of L3, L4 and L5 likely representing subacute fractures. Increased activity within the T2 vertebral body compatible with known compression fracture. No evidence of suggest bony metastatic disease.  As a part of her workup she also had an SPEP done which shows 3 monoclonal protein bands for a total M protein of 2.3 g/dL. On IFE this is noted to be monoclonal IgA kappa protein. Her sedimentation rate was also elevated at 73.  She was referred to [place] for further evaluation to rule out multiple myeloma. Patient notes her mid and lower back pain related to her compression fractures. Also notes some left hip pain. Her labs from January 2017 did not show any anemia, hypercalcemia or renal failure.  She notes that she did have a colonoscopy in 2015 which demonstrated a 4.5 cm rectal polyp which was removed piecemeal. Pathology showed  a tubulovillous adenoma. Patient was also noted to have significant diverticulosis and was not recommended routine colonoscopies. Given her CT scan showing concern of rectal mass she was referred back to Dr. Henrene Pastor at [place] for consideration of a sigmoidoscopy/colonoscopy. Patient notes no overt rectal bleeding. No obstructive symptoms. No change in bowel habits. No new abdominal pain. She also needs to connect with gi for management of her liver cirrhosis and related issues.   INTERVAL HISTORY  Patient is here for her scheduled followup for smoldering myeloma. She notes that she has had a dental evaluation and has had several dental extraction and has f/u to take care of the several additional teeth. She notes no new bone pain. No anemia  MEDICAL HISTORY:  Past Medical History:  Diagnosis Date  . Alcohol abuse   . Arthritis   . Ascites   . Atherosclerosis of aorta ([place])   . Cholelithiasis   . Cirrhosis ([place])   . Complication of anesthesia    Difficult time waking up after having biospy with colonoscopy  . Compression fracture of lumbar vertebra (HCC)     L1 and L2  . Emphysema of lung ([place])   . History of multiple myeloma    smoldering  . Hyponatremia   . Macrocytic anemia   . 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 Osteoporosis   . Pneumonia 04/2014  . Tobacco abuse   . Tubular adenoma   . Vertebral compression fracture (HCC)    ?? 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  SURGICAL HISTORY: Past Surgical History:  Procedure Laterality Date  . COLONOSCOPY N/A 05/24/2014   Procedure: COLONOSCOPY;  Surgeon: Glendell Docker  Simonne Maffucci, MD;  Location: [facility];  Service: Endoscopy;  Laterality: N/A;  MAC if possible ultraslim colonoscope and gastroscope needed  . FLEXIBLE SIGMOIDOSCOPY N/A 05/21/2014   Procedure: FLEXIBLE SIGMOIDOSCOPY;  Surgeon: Gatha Mayer, MD;  Location: [facility];  Service: Endoscopy;  Laterality: N/A;  . KYPHOPLASTY N/A 12/12/2014     Procedure: KYPHOPLASTY;  Surgeon: Phylliss Bob, MD;  Location: [facility];  Service: Orthopedics;  Laterality: N/A;  T10 kyphoplasty  . MANDIBLE FRACTURE SURGERY     X 2  . TONSILLECTOMY      SOCIAL HISTORY: Social History   Social History  . Marital status: Married    Spouse name: N/A  . Number of children: N/A  . Years of education: N/A   Occupational History  . retired -[employer]    Social History Main Topics  . Smoking status: Current Every Day Smoker    Packs/day: 0.25    Years: 46.00    Types: Cigarettes    Last attempt to quit: 05/14/2014  . Smokeless tobacco: Never Used  . Alcohol use 0.0 oz/week     Comment: occasional  . Drug use: No  . Sexual activity: Not on file   Other Topics Concern  . Not on file   Social History Narrative  . No narrative on file   Patient notes that she is drinking about 1 beer and 1 hard liquor every day - was counseled regarding absolute alcohol abstinence.  FAMILY HISTORY:  No family history of colon cancer.  ALLERGIES:  is allergic to oxycodone; tape; and valium [diazepam].  MEDICATIONS:  Current Outpatient Prescriptions  Medication Sig Dispense Refill  . Calcium Citrate-Vitamin D (CALCIUM + D PO) Take by mouth.    . cyclobenzaprine (FLEXERIL) 5 MG tablet Take 5 mg by mouth 3 (three) times daily as needed for muscle spasms.    . Multiple Vitamins-Minerals (MULTIVITAMIN WITH MINERALS) tablet Take 1 tablet by mouth daily.    . TRAMADOL HCL PO Take by mouth as needed.    . Vitamin D, Ergocalciferol, (DRISDOL) 50000 UNITS CAPS capsule Take 5,000 Units by mouth daily.      No current facility-administered medications for this visit.     REVIEW OF SYSTEMS:    10 Point review of Systems was done is negative except as noted above.  PHYSICAL EXAMINATION: ECOG PERFORMANCE STATUS: 2 - Symptomatic, <50% confined to bed  . Vitals:   09/24/16 1311  BP: 137/69  Pulse: (!) 114  Resp: 18  Temp: 98.1 F (36.7 C)   Filed  Weights   09/24/16 1311  Weight: 82 lb 11.2 oz (37.5 kg)   .Body mass index is 16.15 kg/m.  GENERAL:alert, in no acute distress and comfortable SKIN:No acute rashes  EYES: normal, conjunctiva are pink and non-injected, sclera clear OROPHARYNX:no exudate, no erythema and lips, buccal mucosa, and tongue normal  NECK: supple, no JVD, thyroid normal size, non-tender, without nodularity LYMPH:  no palpable lymphadenopathy in the cervical, axillary or inguinal LUNGS: clear to auscultation with normal respiratory effort HEART: regular rate & rhythm,  no murmurs and no lower extremity edema ABDOMEN: abdomen soft, non-tender, normoactive bowel sounds , no palpable hepatosplenomegaly Musculoskeletal: No pedal edema PSYCH: alert & oriented x 3 with fluent speech NEURO: no focal motor/sensory deficits  LABORATORY DATA:  I have reviewed the data as listed  . CBC Latest Ref Rng & Units 09/24/2016 05/11/2016 01/24/2016  WBC 3.9 - 10.3 10e3/uL 7.1 8.6 8.6  Hemoglobin 11.6 - 15.9 g/dL  14.0 13.5 13.7  Hematocrit 34.8 - 46.6 % 40.5 38.8 38.3  Platelets 145 - 400 10e3/uL 280 237 259    . CMP Latest Ref Rng & Units 09/24/2016 05/11/2016 05/11/2016  Glucose 70 - 140 mg/dl 114 103 -  BUN 7.0 - 26.0 mg/dL 4.3(L) 5.0(L) -  Creatinine 0.6 - 1.1 mg/dL 0.6 0.6 -  Sodium 136 - 145 mEq/L 135(L) 133(L) -  Potassium 3.5 - 5.1 mEq/L 4.3 4.2 -  Chloride 101 - 111 mmol/L - - -  CO2 22 - 29 mEq/L 29 28 -  Calcium 8.4 - 10.4 mg/dL 9.6 9.5 -  Total Protein 6.4 - 8.3 g/dL 9.2(H) 8.8(H) 8.3  Total Bilirubin 0.20 - 1.20 mg/dL 0.94 1.09 -  Alkaline Phos 40 - 150 U/L 611(H) 580(H) -  AST 5 - 34 U/L 58(H) 51(H) -  ALT 0 - 55 U/L 17 16 -         RADIOGRAPHIC STUDIES: I have personally reviewed the radiological images as listed and agreed with the findings in the report. No results found.   ASSESSMENT & PLAN:   71 year old Caucasian female with severe osteoporosis and vertebral compression fractures  #1  IgA kappa Smoldering Multiple myeloma with M protein level was about 2.3 g/dL (outside lab) rpt in our lab 1.5g/dl recent bone scans does not show obvious bone tumors.   Patient is here for her 4 month follow. previously M protein has decreased from 1.7 to 1.4 Labs with no signs of anemia, hypercalcemia or renal failure. No new bone pains. Bm Bx shows 21% abnormal plasma cells. Rpt Bone Survey on 05/12/2016 -- showed no evidence of lytic lesions. Plan -no clear indication of progression to multiple myeloma at this time. -I shall f/u on pending SPEP results -hold off on definitive treatments for multiple myeloma unless there is any overt evidence of end organ injury meeting criteria for multiple myeloma. -Return to care with Dr. Irene Limbo in 4 months with repeat CBC, CMP and myeloma panel.  #2 liver cirrhosis with known varices Plan -Counseled on absolute alcohol cessation. -continue f/u with PCP  #3 osteoporosis with vertebral compression fractures.  #4 ?? history of osteonecrosis of the jaw with Fosamax/bisphosphonates previously . Unclear if this truly represents ONJ ---patient decribes it as "having brittle teeth" Plan  -Patient has been following with orthopedics Dr. Wandra Feinstein to manage her osteoporosis . -continue aggressive vitamin D replacement . -she is now following with a dentist to address her dental issues prior to use of Bisphosphonates for osteoporosis -we have previously also recommended possibility of consulting endocrinology to discuss other treatments for her osteoporosis including possibly considering teriparitide. (she needs to discuss this with her PCP)  RTC with Dr Irene Limbo in 4 months with rpt labs.  All of the patients and her female partners questions were answered to their apparent satisfaction. The patient knows to call the clinic with any problems, questions or concerns.  I spent 20 minutes counseling the patient face to face. The total time spent in the  appointment was 25 minutes and more than 50% was on counseling and direct patient cares.    Sullivan Lone MD [place] AAHIVMS [facility] Hematology/Oncology Physician [facility]  (Office):       [phone] (Work cell):  [phone] (Fax):           [phone]

## 2016-09-28 LAB — MULTIPLE MYELOMA PANEL, SERUM
ALBUMIN/GLOB SERPL: 0.7 (ref 0.7–1.7)
ALPHA 1: 0.4 g/dL (ref 0.0–0.4)
Albumin SerPl Elph-Mcnc: 3.5 g/dL (ref 2.9–4.4)
Alpha2 Glob SerPl Elph-Mcnc: 0.7 g/dL (ref 0.4–1.0)
B-GLOBULIN SERPL ELPH-MCNC: 2.7 g/dL — AB (ref 0.7–1.3)
GAMMA GLOB SERPL ELPH-MCNC: 1.6 g/dL (ref 0.4–1.8)
GLOBULIN, TOTAL: 5.4 g/dL — AB (ref 2.2–3.9)
IgG, Qn, Serum: 1215 mg/dL (ref 700–1600)
IgM, Qn, Serum: 278 mg/dL — ABNORMAL HIGH (ref 26–217)
M PROTEIN SERPL ELPH-MCNC: 1.8 g/dL — AB
Total Protein: 8.9 g/dL — ABNORMAL HIGH (ref 6.0–8.5)

## 2016-11-20 DIAGNOSIS — M79605 Pain in left leg: Secondary | ICD-10-CM | POA: Diagnosis not present

## 2016-11-20 DIAGNOSIS — S8012XA Contusion of left lower leg, initial encounter: Secondary | ICD-10-CM | POA: Diagnosis not present

## 2016-11-20 DIAGNOSIS — S51812A Laceration without foreign body of left forearm, initial encounter: Secondary | ICD-10-CM | POA: Diagnosis not present

## 2016-11-25 DIAGNOSIS — S2231XA Fracture of one rib, right side, initial encounter for closed fracture: Secondary | ICD-10-CM | POA: Diagnosis not present

## 2016-11-25 DIAGNOSIS — M810 Age-related osteoporosis without current pathological fracture: Secondary | ICD-10-CM | POA: Diagnosis not present

## 2016-11-25 DIAGNOSIS — M84362A Stress fracture, left tibia, initial encounter for fracture: Secondary | ICD-10-CM | POA: Diagnosis not present

## 2016-11-25 DIAGNOSIS — S22000A Wedge compression fracture of unspecified thoracic vertebra, initial encounter for closed fracture: Secondary | ICD-10-CM | POA: Diagnosis not present

## 2016-11-30 DIAGNOSIS — M79605 Pain in left leg: Secondary | ICD-10-CM | POA: Diagnosis not present

## 2016-12-03 DIAGNOSIS — M8008XD Age-related osteoporosis with current pathological fracture, vertebra(e), subsequent encounter for fracture with routine healing: Secondary | ICD-10-CM | POA: Diagnosis not present

## 2016-12-03 DIAGNOSIS — S51812D Laceration without foreign body of left forearm, subsequent encounter: Secondary | ICD-10-CM | POA: Diagnosis not present

## 2016-12-03 DIAGNOSIS — M80062D Age-related osteoporosis with current pathological fracture, left lower leg, subsequent encounter for fracture with routine healing: Secondary | ICD-10-CM | POA: Diagnosis not present

## 2016-12-03 DIAGNOSIS — K7030 Alcoholic cirrhosis of liver without ascites: Secondary | ICD-10-CM | POA: Diagnosis not present

## 2016-12-03 DIAGNOSIS — C9000 Multiple myeloma not having achieved remission: Secondary | ICD-10-CM | POA: Diagnosis not present

## 2016-12-03 DIAGNOSIS — M810 Age-related osteoporosis without current pathological fracture: Secondary | ICD-10-CM | POA: Diagnosis not present

## 2016-12-05 DIAGNOSIS — S51812D Laceration without foreign body of left forearm, subsequent encounter: Secondary | ICD-10-CM | POA: Diagnosis not present

## 2016-12-05 DIAGNOSIS — M8008XD Age-related osteoporosis with current pathological fracture, vertebra(e), subsequent encounter for fracture with routine healing: Secondary | ICD-10-CM | POA: Diagnosis not present

## 2016-12-05 DIAGNOSIS — M80062D Age-related osteoporosis with current pathological fracture, left lower leg, subsequent encounter for fracture with routine healing: Secondary | ICD-10-CM | POA: Diagnosis not present

## 2016-12-05 DIAGNOSIS — M810 Age-related osteoporosis without current pathological fracture: Secondary | ICD-10-CM | POA: Diagnosis not present

## 2016-12-05 DIAGNOSIS — C9000 Multiple myeloma not having achieved remission: Secondary | ICD-10-CM | POA: Diagnosis not present

## 2016-12-05 DIAGNOSIS — K7030 Alcoholic cirrhosis of liver without ascites: Secondary | ICD-10-CM | POA: Diagnosis not present

## 2016-12-07 DIAGNOSIS — M8008XD Age-related osteoporosis with current pathological fracture, vertebra(e), subsequent encounter for fracture with routine healing: Secondary | ICD-10-CM | POA: Diagnosis not present

## 2016-12-07 DIAGNOSIS — S51812D Laceration without foreign body of left forearm, subsequent encounter: Secondary | ICD-10-CM | POA: Diagnosis not present

## 2016-12-07 DIAGNOSIS — M810 Age-related osteoporosis without current pathological fracture: Secondary | ICD-10-CM | POA: Diagnosis not present

## 2016-12-07 DIAGNOSIS — M80062D Age-related osteoporosis with current pathological fracture, left lower leg, subsequent encounter for fracture with routine healing: Secondary | ICD-10-CM | POA: Diagnosis not present

## 2016-12-07 DIAGNOSIS — C9000 Multiple myeloma not having achieved remission: Secondary | ICD-10-CM | POA: Diagnosis not present

## 2016-12-07 DIAGNOSIS — K7030 Alcoholic cirrhosis of liver without ascites: Secondary | ICD-10-CM | POA: Diagnosis not present

## 2016-12-08 DIAGNOSIS — K7030 Alcoholic cirrhosis of liver without ascites: Secondary | ICD-10-CM | POA: Diagnosis not present

## 2016-12-08 DIAGNOSIS — C9000 Multiple myeloma not having achieved remission: Secondary | ICD-10-CM | POA: Diagnosis not present

## 2016-12-08 DIAGNOSIS — S51812D Laceration without foreign body of left forearm, subsequent encounter: Secondary | ICD-10-CM | POA: Diagnosis not present

## 2016-12-08 DIAGNOSIS — M80062D Age-related osteoporosis with current pathological fracture, left lower leg, subsequent encounter for fracture with routine healing: Secondary | ICD-10-CM | POA: Diagnosis not present

## 2016-12-08 DIAGNOSIS — M810 Age-related osteoporosis without current pathological fracture: Secondary | ICD-10-CM | POA: Diagnosis not present

## 2016-12-08 DIAGNOSIS — M8008XD Age-related osteoporosis with current pathological fracture, vertebra(e), subsequent encounter for fracture with routine healing: Secondary | ICD-10-CM | POA: Diagnosis not present

## 2016-12-09 DIAGNOSIS — M8008XD Age-related osteoporosis with current pathological fracture, vertebra(e), subsequent encounter for fracture with routine healing: Secondary | ICD-10-CM | POA: Diagnosis not present

## 2016-12-09 DIAGNOSIS — M810 Age-related osteoporosis without current pathological fracture: Secondary | ICD-10-CM | POA: Diagnosis not present

## 2016-12-09 DIAGNOSIS — M80062D Age-related osteoporosis with current pathological fracture, left lower leg, subsequent encounter for fracture with routine healing: Secondary | ICD-10-CM | POA: Diagnosis not present

## 2016-12-09 DIAGNOSIS — C9000 Multiple myeloma not having achieved remission: Secondary | ICD-10-CM | POA: Diagnosis not present

## 2016-12-09 DIAGNOSIS — K7030 Alcoholic cirrhosis of liver without ascites: Secondary | ICD-10-CM | POA: Diagnosis not present

## 2016-12-09 DIAGNOSIS — S51812D Laceration without foreign body of left forearm, subsequent encounter: Secondary | ICD-10-CM | POA: Diagnosis not present

## 2016-12-10 DIAGNOSIS — K7030 Alcoholic cirrhosis of liver without ascites: Secondary | ICD-10-CM | POA: Diagnosis not present

## 2016-12-10 DIAGNOSIS — C9000 Multiple myeloma not having achieved remission: Secondary | ICD-10-CM | POA: Diagnosis not present

## 2016-12-10 DIAGNOSIS — S51812D Laceration without foreign body of left forearm, subsequent encounter: Secondary | ICD-10-CM | POA: Diagnosis not present

## 2016-12-10 DIAGNOSIS — M8008XD Age-related osteoporosis with current pathological fracture, vertebra(e), subsequent encounter for fracture with routine healing: Secondary | ICD-10-CM | POA: Diagnosis not present

## 2016-12-10 DIAGNOSIS — M810 Age-related osteoporosis without current pathological fracture: Secondary | ICD-10-CM | POA: Diagnosis not present

## 2016-12-10 DIAGNOSIS — M80062D Age-related osteoporosis with current pathological fracture, left lower leg, subsequent encounter for fracture with routine healing: Secondary | ICD-10-CM | POA: Diagnosis not present

## 2016-12-11 DIAGNOSIS — M810 Age-related osteoporosis without current pathological fracture: Secondary | ICD-10-CM | POA: Diagnosis not present

## 2016-12-11 DIAGNOSIS — M84362A Stress fracture, left tibia, initial encounter for fracture: Secondary | ICD-10-CM | POA: Diagnosis not present

## 2016-12-14 DIAGNOSIS — M810 Age-related osteoporosis without current pathological fracture: Secondary | ICD-10-CM | POA: Diagnosis not present

## 2016-12-14 DIAGNOSIS — K7030 Alcoholic cirrhosis of liver without ascites: Secondary | ICD-10-CM | POA: Diagnosis not present

## 2016-12-14 DIAGNOSIS — M8008XD Age-related osteoporosis with current pathological fracture, vertebra(e), subsequent encounter for fracture with routine healing: Secondary | ICD-10-CM | POA: Diagnosis not present

## 2016-12-14 DIAGNOSIS — C9000 Multiple myeloma not having achieved remission: Secondary | ICD-10-CM | POA: Diagnosis not present

## 2016-12-14 DIAGNOSIS — M80062D Age-related osteoporosis with current pathological fracture, left lower leg, subsequent encounter for fracture with routine healing: Secondary | ICD-10-CM | POA: Diagnosis not present

## 2016-12-14 DIAGNOSIS — S51812D Laceration without foreign body of left forearm, subsequent encounter: Secondary | ICD-10-CM | POA: Diagnosis not present

## 2016-12-15 DIAGNOSIS — K7030 Alcoholic cirrhosis of liver without ascites: Secondary | ICD-10-CM | POA: Diagnosis not present

## 2016-12-15 DIAGNOSIS — M810 Age-related osteoporosis without current pathological fracture: Secondary | ICD-10-CM | POA: Diagnosis not present

## 2016-12-15 DIAGNOSIS — S51812D Laceration without foreign body of left forearm, subsequent encounter: Secondary | ICD-10-CM | POA: Diagnosis not present

## 2016-12-15 DIAGNOSIS — M80062D Age-related osteoporosis with current pathological fracture, left lower leg, subsequent encounter for fracture with routine healing: Secondary | ICD-10-CM | POA: Diagnosis not present

## 2016-12-15 DIAGNOSIS — C9000 Multiple myeloma not having achieved remission: Secondary | ICD-10-CM | POA: Diagnosis not present

## 2016-12-15 DIAGNOSIS — M8008XD Age-related osteoporosis with current pathological fracture, vertebra(e), subsequent encounter for fracture with routine healing: Secondary | ICD-10-CM | POA: Diagnosis not present

## 2016-12-16 DIAGNOSIS — M8008XD Age-related osteoporosis with current pathological fracture, vertebra(e), subsequent encounter for fracture with routine healing: Secondary | ICD-10-CM | POA: Diagnosis not present

## 2016-12-16 DIAGNOSIS — M80062D Age-related osteoporosis with current pathological fracture, left lower leg, subsequent encounter for fracture with routine healing: Secondary | ICD-10-CM | POA: Diagnosis not present

## 2016-12-16 DIAGNOSIS — M810 Age-related osteoporosis without current pathological fracture: Secondary | ICD-10-CM | POA: Diagnosis not present

## 2016-12-16 DIAGNOSIS — S51812D Laceration without foreign body of left forearm, subsequent encounter: Secondary | ICD-10-CM | POA: Diagnosis not present

## 2016-12-16 DIAGNOSIS — C9000 Multiple myeloma not having achieved remission: Secondary | ICD-10-CM | POA: Diagnosis not present

## 2016-12-16 DIAGNOSIS — K7030 Alcoholic cirrhosis of liver without ascites: Secondary | ICD-10-CM | POA: Diagnosis not present

## 2016-12-17 DIAGNOSIS — E46 Unspecified protein-calorie malnutrition: Secondary | ICD-10-CM | POA: Diagnosis not present

## 2016-12-17 DIAGNOSIS — M8008XD Age-related osteoporosis with current pathological fracture, vertebra(e), subsequent encounter for fracture with routine healing: Secondary | ICD-10-CM | POA: Diagnosis not present

## 2016-12-17 DIAGNOSIS — C9000 Multiple myeloma not having achieved remission: Secondary | ICD-10-CM | POA: Diagnosis not present

## 2016-12-17 DIAGNOSIS — S82142D Displaced bicondylar fracture of left tibia, subsequent encounter for closed fracture with routine healing: Secondary | ICD-10-CM | POA: Diagnosis not present

## 2016-12-17 DIAGNOSIS — S51812D Laceration without foreign body of left forearm, subsequent encounter: Secondary | ICD-10-CM | POA: Diagnosis not present

## 2016-12-17 DIAGNOSIS — K7030 Alcoholic cirrhosis of liver without ascites: Secondary | ICD-10-CM | POA: Diagnosis not present

## 2016-12-17 DIAGNOSIS — M80062D Age-related osteoporosis with current pathological fracture, left lower leg, subsequent encounter for fracture with routine healing: Secondary | ICD-10-CM | POA: Diagnosis not present

## 2016-12-17 DIAGNOSIS — M810 Age-related osteoporosis without current pathological fracture: Secondary | ICD-10-CM | POA: Diagnosis not present

## 2016-12-22 DIAGNOSIS — M810 Age-related osteoporosis without current pathological fracture: Secondary | ICD-10-CM | POA: Diagnosis not present

## 2016-12-22 DIAGNOSIS — C9000 Multiple myeloma not having achieved remission: Secondary | ICD-10-CM | POA: Diagnosis not present

## 2016-12-22 DIAGNOSIS — K7030 Alcoholic cirrhosis of liver without ascites: Secondary | ICD-10-CM | POA: Diagnosis not present

## 2016-12-22 DIAGNOSIS — S51812D Laceration without foreign body of left forearm, subsequent encounter: Secondary | ICD-10-CM | POA: Diagnosis not present

## 2016-12-22 DIAGNOSIS — M8008XD Age-related osteoporosis with current pathological fracture, vertebra(e), subsequent encounter for fracture with routine healing: Secondary | ICD-10-CM | POA: Diagnosis not present

## 2016-12-22 DIAGNOSIS — M80062D Age-related osteoporosis with current pathological fracture, left lower leg, subsequent encounter for fracture with routine healing: Secondary | ICD-10-CM | POA: Diagnosis not present

## 2016-12-23 DIAGNOSIS — M8008XD Age-related osteoporosis with current pathological fracture, vertebra(e), subsequent encounter for fracture with routine healing: Secondary | ICD-10-CM | POA: Diagnosis not present

## 2016-12-23 DIAGNOSIS — C9000 Multiple myeloma not having achieved remission: Secondary | ICD-10-CM | POA: Diagnosis not present

## 2016-12-23 DIAGNOSIS — S51812D Laceration without foreign body of left forearm, subsequent encounter: Secondary | ICD-10-CM | POA: Diagnosis not present

## 2016-12-23 DIAGNOSIS — M810 Age-related osteoporosis without current pathological fracture: Secondary | ICD-10-CM | POA: Diagnosis not present

## 2016-12-23 DIAGNOSIS — M80062D Age-related osteoporosis with current pathological fracture, left lower leg, subsequent encounter for fracture with routine healing: Secondary | ICD-10-CM | POA: Diagnosis not present

## 2016-12-23 DIAGNOSIS — K7030 Alcoholic cirrhosis of liver without ascites: Secondary | ICD-10-CM | POA: Diagnosis not present

## 2016-12-24 DIAGNOSIS — K7030 Alcoholic cirrhosis of liver without ascites: Secondary | ICD-10-CM | POA: Diagnosis not present

## 2016-12-24 DIAGNOSIS — M8008XD Age-related osteoporosis with current pathological fracture, vertebra(e), subsequent encounter for fracture with routine healing: Secondary | ICD-10-CM | POA: Diagnosis not present

## 2016-12-24 DIAGNOSIS — M80062D Age-related osteoporosis with current pathological fracture, left lower leg, subsequent encounter for fracture with routine healing: Secondary | ICD-10-CM | POA: Diagnosis not present

## 2016-12-24 DIAGNOSIS — S51812D Laceration without foreign body of left forearm, subsequent encounter: Secondary | ICD-10-CM | POA: Diagnosis not present

## 2016-12-24 DIAGNOSIS — M810 Age-related osteoporosis without current pathological fracture: Secondary | ICD-10-CM | POA: Diagnosis not present

## 2016-12-24 DIAGNOSIS — C9000 Multiple myeloma not having achieved remission: Secondary | ICD-10-CM | POA: Diagnosis not present

## 2016-12-28 DIAGNOSIS — M80062D Age-related osteoporosis with current pathological fracture, left lower leg, subsequent encounter for fracture with routine healing: Secondary | ICD-10-CM | POA: Diagnosis not present

## 2016-12-28 DIAGNOSIS — C9000 Multiple myeloma not having achieved remission: Secondary | ICD-10-CM | POA: Diagnosis not present

## 2016-12-28 DIAGNOSIS — S51812D Laceration without foreign body of left forearm, subsequent encounter: Secondary | ICD-10-CM | POA: Diagnosis not present

## 2016-12-28 DIAGNOSIS — K7030 Alcoholic cirrhosis of liver without ascites: Secondary | ICD-10-CM | POA: Diagnosis not present

## 2016-12-28 DIAGNOSIS — M8008XD Age-related osteoporosis with current pathological fracture, vertebra(e), subsequent encounter for fracture with routine healing: Secondary | ICD-10-CM | POA: Diagnosis not present

## 2016-12-28 DIAGNOSIS — M810 Age-related osteoporosis without current pathological fracture: Secondary | ICD-10-CM | POA: Diagnosis not present

## 2016-12-29 DIAGNOSIS — K7030 Alcoholic cirrhosis of liver without ascites: Secondary | ICD-10-CM | POA: Diagnosis not present

## 2016-12-29 DIAGNOSIS — S51812D Laceration without foreign body of left forearm, subsequent encounter: Secondary | ICD-10-CM | POA: Diagnosis not present

## 2016-12-29 DIAGNOSIS — M810 Age-related osteoporosis without current pathological fracture: Secondary | ICD-10-CM | POA: Diagnosis not present

## 2016-12-29 DIAGNOSIS — C9000 Multiple myeloma not having achieved remission: Secondary | ICD-10-CM | POA: Diagnosis not present

## 2016-12-29 DIAGNOSIS — M8008XD Age-related osteoporosis with current pathological fracture, vertebra(e), subsequent encounter for fracture with routine healing: Secondary | ICD-10-CM | POA: Diagnosis not present

## 2016-12-29 DIAGNOSIS — M80062D Age-related osteoporosis with current pathological fracture, left lower leg, subsequent encounter for fracture with routine healing: Secondary | ICD-10-CM | POA: Diagnosis not present

## 2016-12-30 DIAGNOSIS — K7030 Alcoholic cirrhosis of liver without ascites: Secondary | ICD-10-CM | POA: Diagnosis not present

## 2016-12-30 DIAGNOSIS — S51812D Laceration without foreign body of left forearm, subsequent encounter: Secondary | ICD-10-CM | POA: Diagnosis not present

## 2016-12-30 DIAGNOSIS — M810 Age-related osteoporosis without current pathological fracture: Secondary | ICD-10-CM | POA: Diagnosis not present

## 2016-12-30 DIAGNOSIS — M8008XD Age-related osteoporosis with current pathological fracture, vertebra(e), subsequent encounter for fracture with routine healing: Secondary | ICD-10-CM | POA: Diagnosis not present

## 2016-12-30 DIAGNOSIS — M80062D Age-related osteoporosis with current pathological fracture, left lower leg, subsequent encounter for fracture with routine healing: Secondary | ICD-10-CM | POA: Diagnosis not present

## 2016-12-30 DIAGNOSIS — C9000 Multiple myeloma not having achieved remission: Secondary | ICD-10-CM | POA: Diagnosis not present

## 2016-12-31 DIAGNOSIS — R000 Tachycardia, unspecified: Secondary | ICD-10-CM | POA: Diagnosis not present

## 2016-12-31 DIAGNOSIS — R9431 Abnormal electrocardiogram [ECG] [EKG]: Secondary | ICD-10-CM | POA: Diagnosis not present

## 2016-12-31 DIAGNOSIS — K7030 Alcoholic cirrhosis of liver without ascites: Secondary | ICD-10-CM | POA: Diagnosis not present

## 2016-12-31 DIAGNOSIS — J449 Chronic obstructive pulmonary disease, unspecified: Secondary | ICD-10-CM | POA: Diagnosis not present

## 2017-01-01 ENCOUNTER — Ambulatory Visit (INDEPENDENT_AMBULATORY_CARE_PROVIDER_SITE_OTHER): Payer: Medicare Other | Admitting: Cardiovascular Disease

## 2017-01-01 ENCOUNTER — Encounter: Payer: Self-pay | Admitting: Cardiovascular Disease

## 2017-01-01 VITALS — BP 127/79 | HR 129 | Ht 60.0 in | Wt 80.8 lb

## 2017-01-01 DIAGNOSIS — I700 Atherosclerosis of aorta: Secondary | ICD-10-CM | POA: Diagnosis not present

## 2017-01-01 DIAGNOSIS — I2781 Cor pulmonale (chronic): Secondary | ICD-10-CM | POA: Diagnosis not present

## 2017-01-01 DIAGNOSIS — K7031 Alcoholic cirrhosis of liver with ascites: Secondary | ICD-10-CM | POA: Diagnosis not present

## 2017-01-01 DIAGNOSIS — R000 Tachycardia, unspecified: Secondary | ICD-10-CM | POA: Diagnosis not present

## 2017-01-01 NOTE — Patient Instructions (Signed)
Dr Sallyanne Kuster recommends that you continue on your current medications as directed. Please refer to the Current Medication list given to you today.  Your physician has requested that you have an echocardiogram. Echocardiography is a painless test that uses sound waves to create images of your heart. It provides your doctor with information about the size and shape of your heart and how well your heart's chambers and valves are working. This procedure takes approximately one hour. There are no restrictions for this procedure.  Dr Sallyanne Kuster recommends that you follow-up with him as needed.

## 2017-01-01 NOTE — Progress Notes (Signed)
Cardiology Office Note:    Date:  01/01/2017   ID:  Cassidy Barrett, DOB 12/08/1945, MRN [id number]  PCP:  System, Pcp Not In  Cardiologist:  Sanda Klein, MD    Referring MD: Everardo Beals, NP   Cassidy Barrett is a 71 y.o. female who is being seen today for the evaluation of tachycardia at the request of Everardo Beals, NP.  History of Present Illness:    Cassidy Barrett is a 71 y.o. female with a hx of emphysema, questionable diagnosis of liver cirrhosis (based on nodular liver contour and presence of ascites on CT abdomen from 2015) , macrocytic anemia, osteoporosis with vertebral compression fractures, ongoing alcohol abuse, current smoker, monoclonal IgA smoldering myeloma.  Her physical therapist has reported tachycardia on a couple of occasions. Electrocardiogram performed in PCPs office showed sinus tachycardia in the same rhythm is seen today at around 130 bpm. The patient is unaware of the rapid heartbeat. Looking through her records heart rates well over 110 bpm have been the norm for more than a year now.  She has COPD but does not take inhaled bronchodilators. Hemoglobin and TSH checked in her primary care physician's office yesterday were normal. A screening chest CT for lung cancer performed in October 2017 shows substantial calcified atheromatous plaque in the ascending aorta and aortic arch, hard to comment on coronary calcium on this low dose study.  Past Medical History:  Diagnosis Date  . Alcohol abuse   . Arthritis   . Ascites   . Atherosclerosis of aorta ([place])   . Cholelithiasis   . Cirrhosis ([place])   . Complication of anesthesia    Difficult time waking up after having biospy with colonoscopy  . Compression fracture of lumbar vertebra (HCC)     L1 and L2  . Emphysema of lung ([place])   . History of multiple myeloma    smoldering  . Hyponatremia   . Macrocytic anemia   . 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 Osteoporosis   . Pneumonia 04/2014  . Tobacco  abuse   . Tubular adenoma   . Vertebral compression fracture [facility])     Past Surgical History:  Procedure Laterality Date  . COLONOSCOPY N/A 05/24/2014   Procedure: COLONOSCOPY;  Surgeon: Gatha Mayer, MD;  [facility];  Service: Endoscopy;  Laterality: N/A;  MAC if possible ultraslim colonoscope and gastroscope needed  . FLEXIBLE SIGMOIDOSCOPY N/A 05/21/2014   Procedure: FLEXIBLE SIGMOIDOSCOPY;  Surgeon: Gatha Mayer, MD;  [facility];  Service: Endoscopy;  Laterality: N/A;  . KYPHOPLASTY N/A 12/12/2014   Procedure: KYPHOPLASTY;  Surgeon: Phylliss Bob, MD;  Location: [facility];  Service: Orthopedics;  Laterality: N/A;  T10 kyphoplasty  . MANDIBLE FRACTURE SURGERY     X 2  . TONSILLECTOMY      Current Medications: Current Meds  Medication Sig  . Calcium Citrate-Vitamin D (CALCIUM + D PO) Take by mouth.  . cyclobenzaprine (FLEXERIL) 5 MG tablet Take 5 mg by mouth 3 (three) times daily as needed for muscle spasms.  Marland Kitchen ibuprofen (ADVIL,MOTRIN) 800 MG tablet Take 800 mg by mouth every 8 (eight) hours as needed.  . Multiple Vitamins-Minerals (MULTIVITAMIN WITH MINERALS) tablet Take 1 tablet by mouth daily.  . TRAMADOL HCL PO Take by mouth as needed.  . Vitamin D, Ergocalciferol, (DRISDOL) 50000 UNITS CAPS capsule Take 5,000 Units by mouth daily.      Allergies:   Hydrocodone-acetaminophen; Oxycodone; Tape; and Valium [diazepam]   Social History  Social History  . Marital status: Married    Spouse name: N/A  . Number of children: N/A  . Years of education: N/A   Occupational History  . retired -[employer]    Social History Main Topics  . Smoking status: Current Every Day Smoker    Packs/day: 0.25    Years: 46.00    Types: Cigarettes    Last attempt to quit: 05/14/2014  . Smokeless tobacco: Never Used  . Alcohol use 0.0 oz/week     Comment: occasional  . Drug use: No  . Sexual activity: Not Asked   Other Topics Concern  . None   Social  History Narrative  . None     Family History: The patient's family history includes ALS in her brother; COPD in her sister; Heart disease in her mother; Other in her father. There is no history of Colon cancer or Breast cancer. ROS:   Please see the history of present illness.    All other systems reviewed and are negative.  EKGs/Labs/Other Studies Reviewed:    The following studies were reviewed today: Notes from hematology clinic  EKG:  EKG is ordered today.  The ekg ordered today demonstrates sinus tachycardia with prominent P waves in the inferior leads consistent with right atrial enlargement, otherwise a normal ECG. QTC 414 ms ECG from 2015 was essentially normal except for left atrial enlargement  Recent Labs: 09/24/2016: ALT 17; BUN 4.3; Creatinine 0.6; HGB 14.0; Platelets 280; Potassium 4.3; Sodium 135   Recent Lipid Panel No results found for: CHOL, TRIG, HDL, CHOLHDL, VLDL, LDLCALC, LDLDIRECT  Physical Exam:    VS:  BP 127/79 (BP Location: Right Arm, Patient Position: Sitting, Cuff Size: Small)   Pulse (!) 129   Ht 5' (1.524 m)   Wt 80 lb 12.8 oz (36.7 kg)   BMI 15.78 kg/m     Wt Readings from Last 3 Encounters:  01/01/17 80 lb 12.8 oz (36.7 kg)  09/24/16 82 lb 11.2 oz (37.5 kg)  05/11/16 85 lb 1.6 oz (38.6 kg)     GEN: Very slender, clearly under-nourished, well developed in no acute distress HEENT: Normal. Several missing teeth. NECK: No JVD; No carotid bruits LYMPHATICS: No lymphadenopathy CARDIAC: Tachycardia, otherwise normal exam with normal S1-S2, RRR, no murmurs, rubs, gallops RESPIRATORY:  Clear to auscultation without rales, wheezing or rhonchi  ABDOMEN: Soft, non-tender, non-distended. There is no detectable ascites by physical exam. MUSCULOSKELETAL:  No edema; No deformity  SKIN: Warm and dry no evidence of jaundice. No telangiectasias. NEUROLOGIC:  Alert and oriented x 3 PSYCHIATRIC:  Normal affect   ASSESSMENT:    1. Tachycardia   2.  Chronic cor pulmonale (HCC)    PLAN:    In order of problems listed above:  1. Sinus tachy: Cardiovascular exam is otherwise normal and other than smoking she really does not have high risk for cardiovascular disease. Suspect a sinus tachycardia is physiological and reflecting an extracardiac illness. It does not appear to be related to her medications, anemia, hyperthyroidism or another obvious cause. It is asymptomatic. Treatment with beta blockers would only be a cosmetic intervention and is not really needed. If she is indeed found to have portal hypertension with esophageal varices (which the patient denies) she may benefit from treatment with a nonselective beta blocker such as nadolol to lessen the risk of bleeding. We'll check an echocardiogram to make sure she does not have asymptomatic left ventricular dysfunction, but I doubt the answer to her tachycardia  will be found there. 2. COPD: She has an emphysematous chest and the presence of cor pulmonale is also suggested by the prominent P waves in leads 2, 3, aVF on her ECG. Suspect the echo show enlarged right heart chambers, but I'm not sure that the tachycardia is necessarily related to this. If she does have severe right ventricular dysfunction, that might explain tachycardia, but there are no overt findings of right heart failure on physical exam. 3. Aortic atherosclerosis:, this is evident on her chest CT. I don't think she is a statin candidate in view of her liver problems. 4. Cirrhosis: He has been encouraged to stop drinking, but continues to have a couple of drinks a day. She disputes the diagnosis of cirrhosis. He has not had evidence of encephalopathy, coagulopathy, known GI bleeding from varices or clinically overt ascites (although this was seen on CT. The labs are more suggestive of a cholestatic process with severely elevated alkaline phosphatase and only mildly elevated transaminases. It's possible that her alkaline phosphatase is  related to her bone fractures as well. She should really see a specialist and I also encouraged her to permit the stop alcohol. The vasodilation/AV shunting and high cardiac output of cirrhosis could explain tachycardia, but that would be an unusual presentation.   Medication Adjustments/Labs and Tests Ordered: Current medicines are reviewed at length with the patient today.  Concerns regarding medicines are outlined above. Labs and tests ordered and medication changes are outlined in the patient instructions below:  Patient Instructions  Dr Sallyanne Kuster recommends that you continue on your current medications as directed. Please refer to the Current Medication list given to you today.  Your physician has requested that you have an echocardiogram. Echocardiography is a painless test that uses sound waves to create images of your heart. It provides your doctor with information about the size and shape of your heart and how well your heart's chambers and valves are working. This procedure takes approximately one hour. There are no restrictions for this procedure.  Dr Sallyanne Kuster recommends that you follow-up with him as needed.    Signed, Sanda Klein, MD  01/01/2017 4:31 PM    [facility]

## 2017-01-04 DIAGNOSIS — E46 Unspecified protein-calorie malnutrition: Secondary | ICD-10-CM | POA: Diagnosis not present

## 2017-01-04 DIAGNOSIS — R000 Tachycardia, unspecified: Secondary | ICD-10-CM | POA: Diagnosis not present

## 2017-01-04 DIAGNOSIS — S82142D Displaced bicondylar fracture of left tibia, subsequent encounter for closed fracture with routine healing: Secondary | ICD-10-CM | POA: Diagnosis not present

## 2017-01-04 DIAGNOSIS — E871 Hypo-osmolality and hyponatremia: Secondary | ICD-10-CM | POA: Diagnosis not present

## 2017-01-04 DIAGNOSIS — K7460 Unspecified cirrhosis of liver: Secondary | ICD-10-CM | POA: Diagnosis not present

## 2017-01-05 DIAGNOSIS — S51812D Laceration without foreign body of left forearm, subsequent encounter: Secondary | ICD-10-CM | POA: Diagnosis not present

## 2017-01-05 DIAGNOSIS — K7030 Alcoholic cirrhosis of liver without ascites: Secondary | ICD-10-CM | POA: Diagnosis not present

## 2017-01-05 DIAGNOSIS — M80062D Age-related osteoporosis with current pathological fracture, left lower leg, subsequent encounter for fracture with routine healing: Secondary | ICD-10-CM | POA: Diagnosis not present

## 2017-01-05 DIAGNOSIS — M8008XD Age-related osteoporosis with current pathological fracture, vertebra(e), subsequent encounter for fracture with routine healing: Secondary | ICD-10-CM | POA: Diagnosis not present

## 2017-01-05 DIAGNOSIS — C9000 Multiple myeloma not having achieved remission: Secondary | ICD-10-CM | POA: Diagnosis not present

## 2017-01-05 DIAGNOSIS — M810 Age-related osteoporosis without current pathological fracture: Secondary | ICD-10-CM | POA: Diagnosis not present

## 2017-01-06 DIAGNOSIS — K7030 Alcoholic cirrhosis of liver without ascites: Secondary | ICD-10-CM | POA: Diagnosis not present

## 2017-01-06 DIAGNOSIS — S51812D Laceration without foreign body of left forearm, subsequent encounter: Secondary | ICD-10-CM | POA: Diagnosis not present

## 2017-01-06 DIAGNOSIS — C9000 Multiple myeloma not having achieved remission: Secondary | ICD-10-CM | POA: Diagnosis not present

## 2017-01-06 DIAGNOSIS — M8008XD Age-related osteoporosis with current pathological fracture, vertebra(e), subsequent encounter for fracture with routine healing: Secondary | ICD-10-CM | POA: Diagnosis not present

## 2017-01-06 DIAGNOSIS — M810 Age-related osteoporosis without current pathological fracture: Secondary | ICD-10-CM | POA: Diagnosis not present

## 2017-01-06 DIAGNOSIS — M80062D Age-related osteoporosis with current pathological fracture, left lower leg, subsequent encounter for fracture with routine healing: Secondary | ICD-10-CM | POA: Diagnosis not present

## 2017-01-08 DIAGNOSIS — M810 Age-related osteoporosis without current pathological fracture: Secondary | ICD-10-CM | POA: Diagnosis not present

## 2017-01-08 DIAGNOSIS — M84362D Stress fracture, left tibia, subsequent encounter for fracture with routine healing: Secondary | ICD-10-CM | POA: Diagnosis not present

## 2017-01-14 ENCOUNTER — Other Ambulatory Visit: Payer: Self-pay

## 2017-01-14 ENCOUNTER — Ambulatory Visit (HOSPITAL_COMMUNITY): Payer: Medicare Other | Attending: Cardiology

## 2017-01-14 DIAGNOSIS — I2781 Cor pulmonale (chronic): Secondary | ICD-10-CM | POA: Diagnosis not present

## 2017-01-14 DIAGNOSIS — R000 Tachycardia, unspecified: Secondary | ICD-10-CM | POA: Diagnosis not present

## 2017-01-19 DIAGNOSIS — M80062D Age-related osteoporosis with current pathological fracture, left lower leg, subsequent encounter for fracture with routine healing: Secondary | ICD-10-CM | POA: Diagnosis not present

## 2017-01-19 DIAGNOSIS — M810 Age-related osteoporosis without current pathological fracture: Secondary | ICD-10-CM | POA: Diagnosis not present

## 2017-01-19 DIAGNOSIS — M8008XD Age-related osteoporosis with current pathological fracture, vertebra(e), subsequent encounter for fracture with routine healing: Secondary | ICD-10-CM | POA: Diagnosis not present

## 2017-01-19 DIAGNOSIS — C9000 Multiple myeloma not having achieved remission: Secondary | ICD-10-CM | POA: Diagnosis not present

## 2017-01-19 DIAGNOSIS — K7030 Alcoholic cirrhosis of liver without ascites: Secondary | ICD-10-CM | POA: Diagnosis not present

## 2017-01-19 DIAGNOSIS — S51812D Laceration without foreign body of left forearm, subsequent encounter: Secondary | ICD-10-CM | POA: Diagnosis not present

## 2017-01-20 ENCOUNTER — Telehealth: Payer: Self-pay | Admitting: Cardiovascular Disease

## 2017-01-20 NOTE — Telephone Encounter (Signed)
Pt returning Jenna's call,concerning her Echo results.

## 2017-01-21 ENCOUNTER — Ambulatory Visit (HOSPITAL_BASED_OUTPATIENT_CLINIC_OR_DEPARTMENT_OTHER): Payer: Medicare Other | Admitting: Hematology

## 2017-01-21 ENCOUNTER — Encounter: Payer: Self-pay | Admitting: Hematology

## 2017-01-21 ENCOUNTER — Other Ambulatory Visit (HOSPITAL_BASED_OUTPATIENT_CLINIC_OR_DEPARTMENT_OTHER): Payer: Medicare Other

## 2017-01-21 VITALS — BP 132/60 | HR 104 | Temp 98.4°F | Resp 20 | Ht 60.0 in | Wt 81.6 lb

## 2017-01-21 DIAGNOSIS — C9000 Multiple myeloma not having achieved remission: Secondary | ICD-10-CM | POA: Diagnosis not present

## 2017-01-21 DIAGNOSIS — D472 Monoclonal gammopathy: Secondary | ICD-10-CM

## 2017-01-21 LAB — CBC & DIFF AND RETIC
BASO%: 0.6 % (ref 0.0–2.0)
Basophils Absolute: 0.1 10*3/uL (ref 0.0–0.1)
EOS%: 2.2 % (ref 0.0–7.0)
Eosinophils Absolute: 0.2 10*3/uL (ref 0.0–0.5)
HCT: 37.3 % (ref 34.8–46.6)
HGB: 12.8 g/dL (ref 11.6–15.9)
Immature Retic Fract: 5 % (ref 1.60–10.00)
LYMPH%: 24.5 % (ref 14.0–49.7)
MCH: 34.3 pg — ABNORMAL HIGH (ref 25.1–34.0)
MCHC: 34.3 g/dL (ref 31.5–36.0)
MCV: 100 fL (ref 79.5–101.0)
MONO#: 1.1 10*3/uL — ABNORMAL HIGH (ref 0.1–0.9)
MONO%: 12.5 % (ref 0.0–14.0)
NEUT#: 5.2 10*3/uL (ref 1.5–6.5)
NEUT%: 60.2 % (ref 38.4–76.8)
Platelets: 295 10*3/uL (ref 145–400)
RBC: 3.73 10*6/uL (ref 3.70–5.45)
RDW: 14.3 % (ref 11.2–14.5)
Retic %: 2.48 % — ABNORMAL HIGH (ref 0.70–2.10)
Retic Ct Abs: 92.5 10*3/uL — ABNORMAL HIGH (ref 33.70–90.70)
WBC: 8.7 10*3/uL (ref 3.9–10.3)
lymph#: 2.1 10*3/uL (ref 0.9–3.3)

## 2017-01-21 LAB — COMPREHENSIVE METABOLIC PANEL WITH GFR
ALT: 18 U/L (ref 0–55)
AST: 53 U/L — ABNORMAL HIGH (ref 5–34)
Albumin: 3.1 g/dL — ABNORMAL LOW (ref 3.5–5.0)
Alkaline Phosphatase: 430 U/L — ABNORMAL HIGH (ref 40–150)
Anion Gap: 13 meq/L — ABNORMAL HIGH (ref 3–11)
BUN: 5.5 mg/dL — ABNORMAL LOW (ref 7.0–26.0)
CO2: 29 meq/L (ref 22–29)
Calcium: 9.7 mg/dL (ref 8.4–10.4)
Chloride: 96 meq/L — ABNORMAL LOW (ref 98–109)
Creatinine: 0.7 mg/dL (ref 0.6–1.1)
EGFR: >90 ml/min/1.73 m2
Glucose: 109 mg/dL (ref 70–140)
Potassium: 4.1 meq/L (ref 3.5–5.1)
Sodium: 137 meq/L (ref 136–145)
Total Bilirubin: 0.76 mg/dL (ref 0.20–1.20)
Total Protein: 8.9 g/dL — ABNORMAL HIGH (ref 6.4–8.3)

## 2017-01-21 NOTE — Telephone Encounter (Signed)
Spoke to patient. Result given . Verbalized understanding ROUTED ECHO REPORT TO PRIMARY DR [facility] FAX [phone]

## 2017-01-21 NOTE — Patient Instructions (Signed)
Thank you for choosing [facility] to provide your oncology and hematology care.  To afford each patient quality time with our providers, please arrive 30 minutes before your scheduled appointment time.  If you arrive late for your appointment, you may be asked to reschedule.  We strive to give you quality time with our providers, and arriving late affects you and other patients whose appointments are after yours.  If you are a no show for multiple scheduled visits, you may be dismissed from the clinic at the providers discretion.   Again, thank you for choosing [facility], our hope is that these requests will decrease the amount of time that you wait before being seen by our physicians.  ______________________________________________________________________ Should you have questions after your visit to the [facility], please contact our office at [phone] between the hours of 8:30 and 4:30 p.m.    Voicemails left after 4:30p.m will not be returned until the following business day.   For prescription refill requests, please have your pharmacy contact us directly.  Please also try to allow 48 hours for prescription requests.   Please contact the scheduling department for questions regarding scheduling.  For scheduling of procedures such as PET scans, CT scans, MRI, Ultrasound, etc please contact central scheduling at [phone].   Resources For Cancer Patients and Caregivers:  American Cancer Society:  800-227-2345  Can help patients locate various types of support and financial assistance Cancer Care: 1-800-813-HOPE (4673) Provides financial assistance, online support groups, medication/co-pay assistance.   [place] DSS:  336-641-3447 Where to apply for food stamps, Medicaid, and utility assistance Medicare Rights Center: 800-333-4114 Helps people with Medicare understand their rights and benefits, navigate the Medicare system, and secure the  quality healthcare they deserve SCAT: 336-333-6589 [place] [employer]'s shared-ride transportation service for eligible riders who have a disability that prevents them from riding the fixed route bus.   For additional information on assistance programs please contact our social worker:   Grier Hock/Abigail Elmore:  [phone] 

## 2017-01-22 LAB — KAPPA/LAMBDA LIGHT CHAINS
Ig Kappa Free Light Chain: 59.9 mg/L — ABNORMAL HIGH (ref 3.3–19.4)
Ig Lambda Free Light Chain: 16.8 mg/L (ref 5.7–26.3)
Kappa/Lambda FluidC Ratio: 3.57 — ABNORMAL HIGH (ref 0.26–1.65)

## 2017-01-25 ENCOUNTER — Telehealth: Payer: Self-pay | Admitting: Hematology

## 2017-01-25 DIAGNOSIS — S82142D Displaced bicondylar fracture of left tibia, subsequent encounter for closed fracture with routine healing: Secondary | ICD-10-CM | POA: Diagnosis not present

## 2017-01-25 DIAGNOSIS — R000 Tachycardia, unspecified: Secondary | ICD-10-CM | POA: Diagnosis not present

## 2017-01-25 DIAGNOSIS — E46 Unspecified protein-calorie malnutrition: Secondary | ICD-10-CM | POA: Diagnosis not present

## 2017-01-25 LAB — MULTIPLE MYELOMA PANEL, SERUM
ALBUMIN SERPL ELPH-MCNC: 3.3 g/dL (ref 2.9–4.4)
ALBUMIN/GLOB SERPL: 0.7 (ref 0.7–1.7)
ALPHA 1: 0.3 g/dL (ref 0.0–0.4)
ALPHA2 GLOB SERPL ELPH-MCNC: 0.7 g/dL (ref 0.4–1.0)
B-Globulin SerPl Elph-Mcnc: 2.9 g/dL — ABNORMAL HIGH (ref 0.7–1.3)
Gamma Glob SerPl Elph-Mcnc: 1.3 g/dL (ref 0.4–1.8)
Globulin, Total: 5.2 g/dL — ABNORMAL HIGH (ref 2.2–3.9)
IGM (IMMUNOGLOBIN M), SRM: 166 mg/dL (ref 26–217)
IgA, Qn, Serum: 3029 mg/dL — ABNORMAL HIGH (ref 87–352)
M Protein SerPl Elph-Mcnc: 1.9 g/dL — ABNORMAL HIGH
TOTAL PROTEIN: 8.5 g/dL (ref 6.0–8.5)

## 2017-01-25 NOTE — Progress Notes (Signed)
Marland Kitchen    HEMATOLOGY/ONCOLOGY CLINIC NOTE  Date of Service: .01/21/2017  Patient Care Team: System, Pcp Not In as PCP - General Jeralene Peters, DDS as Dietitian (Periodontics)  Orthopedics - Dr. Layne Benton from [facility]   CHIEF COMPLAINTS/PURPOSE OF CONSULTATION:  F/u for Smoldering Multiple myeloma.  HISTORY OF PRESENTING ILLNESS:   Cassidy Barrett is a wonderful 71 y.o. female who has been referred to [place] by Dr Wandra Feinstein for evaluation and management of possible plasma cell dyscrasia.  Patient has a history of liver cirrhosis with ascites, alcohol abuse, emphysema, macrocytic anemia, osteoporosis and was seen by orthopedics for spinal vertebral compression fractures in the setting of osteoporosis.  She was noted to have multiple significant lab abnormalities including an elevated alkaline phosphatase and high total protein level.  Chronic appealing L1-L2 compression fractures  Patient subsequently had a bone scan to evaluate these compression fractures on 09/26/2015.  This showed mild increased activity within the superior endplates of L3, L4 and L5 likely representing subacute fractures. Increased activity within the T2 vertebral body compatible with known compression fracture. No evidence of suggest bony metastatic disease.  As a part of her workup she also had an SPEP done which shows 3 monoclonal protein bands for a total M protein of 2.3 g/dL. On IFE this is noted to be monoclonal IgA kappa protein. Her sedimentation rate was also elevated at 73.  She was referred to [place] for further evaluation to rule out multiple myeloma. Patient notes her mid and lower back pain related to her compression fractures. Also notes some left hip pain. Her labs from January 2017 did not show any anemia, hypercalcemia or renal failure.  She notes that she did have a colonoscopy in 2015 which demonstrated a 4.5 cm rectal polyp which was removed piecemeal. Pathology showed a  tubulovillous adenoma. Patient was also noted to have significant diverticulosis and was not recommended routine colonoscopies. Given her CT scan showing concern of rectal mass she was referred back to Dr. Henrene Pastor at [facility] for consideration of a sigmoidoscopy/colonoscopy. Patient notes no overt rectal bleeding. No obstructive symptoms. No change in bowel habits. No new abdominal pain. She also needs to connect with gi for management of her liver cirrhosis and related issues.   INTERVAL HISTORY  Patient is here for her scheduled followup for smoldering myeloma. She notes that she has had a dental evaluation and has had several dental extraction and has f/u to take care of the several additional teeth. She notes no new bone pain. No anemia. Has been on calcitonin per Dr Layne Benton for her osteoporosis and takes her Vit D and calcium. Had a recentfall with a tibial plateau fracture. Using a walker and knee brace. No fevers/chills. No new anemia/renal failure or hypercalcemia.   MEDICAL HISTORY:  Past Medical History:  Diagnosis Date  . Alcohol abuse   . Arthritis   . Ascites   . Atherosclerosis of aorta ([place])   . Cholelithiasis   . Cirrhosis ([place])   . Complication of anesthesia    Difficult time waking up after having biospy with colonoscopy  . Compression fracture of lumbar vertebra (HCC)     L1 and L2  . Emphysema of lung ([place])   . History of multiple myeloma    smoldering  . Hyponatremia   . Macrocytic anemia   . 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 Osteoporosis   . Pneumonia 04/2014  . Tobacco abuse   . Tubular adenoma   .  Vertebral compression fracture (HCC)    ?? 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  SURGICAL HISTORY: Past Surgical History:  Procedure Laterality Date  . COLONOSCOPY N/A 05/24/2014   Procedure: COLONOSCOPY;  Surgeon: Gatha Mayer, MD;  Location: [facility];  Service: Endoscopy;  Laterality: N/A;  MAC if possible ultraslim  colonoscope and gastroscope needed  . FLEXIBLE SIGMOIDOSCOPY N/A 05/21/2014   Procedure: FLEXIBLE SIGMOIDOSCOPY;  Surgeon: Gatha Mayer, MD;  Location: [facility];  Service: Endoscopy;  Laterality: N/A;  . KYPHOPLASTY N/A 12/12/2014   Procedure: KYPHOPLASTY;  Surgeon: Phylliss Bob, MD;  Location: [facility];  Service: Orthopedics;  Laterality: N/A;  T10 kyphoplasty  . MANDIBLE FRACTURE SURGERY     X 2  . TONSILLECTOMY      SOCIAL HISTORY: Social History   Social History  . Marital status: Married    Spouse name: N/A  . Number of children: N/A  . Years of education: N/A   Occupational History  . retired -[employer]    Social History Main Topics  . Smoking status: Current Every Day Smoker    Packs/day: 0.25    Years: 46.00    Types: Cigarettes    Last attempt to quit: 05/14/2014  . Smokeless tobacco: Never Used  . Alcohol use 0.0 oz/week     Comment: occasional  . Drug use: No  . Sexual activity: Not on file   Other Topics Concern  . Not on file   Social History Narrative  . No narrative on file   Patient notes that she is drinking about 1 beer and 1 hard liquor every day - was counseled regarding absolute alcohol abstinence.  FAMILY HISTORY:  No family history of colon cancer.  ALLERGIES:  is allergic to hydrocodone-acetaminophen; oxycodone; tape; and valium [diazepam].  MEDICATIONS:  Current Outpatient Prescriptions  Medication Sig Dispense Refill  . metoprolol succinate (TOPROL-XL) 25 MG 24 hr tablet Take 12.5 mg by mouth daily.    . Calcium Citrate-Vitamin D (CALCIUM + D PO) Take by mouth.    . cyclobenzaprine (FLEXERIL) 5 MG tablet Take 5 mg by mouth 3 (three) times daily as needed for muscle spasms.    Marland Kitchen ibuprofen (ADVIL,MOTRIN) 800 MG tablet Take 800 mg by mouth every 8 (eight) hours as needed.    . Multiple Vitamins-Minerals (MULTIVITAMIN WITH MINERALS) tablet Take 1 tablet by mouth daily.    . TRAMADOL HCL PO Take by mouth as needed.    . Vitamin  D, Ergocalciferol, (DRISDOL) 50000 UNITS CAPS capsule Take 5,000 Units by mouth daily.      No current facility-administered medications for this visit.     REVIEW OF SYSTEMS:    10 Point review of Systems was done is negative except as noted above.  PHYSICAL EXAMINATION: ECOG PERFORMANCE STATUS: 2 - Symptomatic, <50% confined to bed  . Vitals:   01/21/17 1300  BP: 132/60  Pulse: (!) 104  Resp: 20  Temp: 98.4 F (36.9 C)   Filed Weights   01/21/17 1300  Weight: 81 lb 9.6 oz (37 kg)   .Body mass index is 15.94 kg/m.  GENERAL:alert, in no acute distress and comfortable SKIN:No acute rashes  EYES: normal, conjunctiva are pink and non-injected, sclera clear OROPHARYNX:no exudate, no erythema and lips, buccal mucosa, and tongue normal  NECK: supple, no JVD, thyroid normal size, non-tender, without nodularity LYMPH:  no palpable lymphadenopathy in the cervical, axillary or inguinal LUNGS: clear to auscultation with normal respiratory effort HEART: regular  rate & rhythm,  no murmurs and no lower extremity edema ABDOMEN: abdomen soft, non-tender, normoactive bowel sounds , no palpable hepatosplenomegaly Musculoskeletal: No pedal edema PSYCH: alert & oriented x 3 with fluent speech NEURO: no focal motor/sensory deficits  LABORATORY DATA:  I have reviewed the data as listed  . CBC Latest Ref Rng & Units 01/21/2017 09/24/2016 05/11/2016  WBC 3.9 - 10.3 10e3/uL 8.7 7.1 8.6  Hemoglobin 11.6 - 15.9 g/dL 12.8 14.0 13.5  Hematocrit 34.8 - 46.6 % 37.3 40.5 38.8  Platelets 145 - 400 10e3/uL 295 280 237    . CMP Latest Ref Rng & Units 01/21/2017 01/21/2017 09/24/2016  Glucose 70 - 140 mg/dl 109 - 114  BUN 7.0 - 26.0 mg/dL 5.5(L) - 4.3(L)  Creatinine 0.6 - 1.1 mg/dL 0.7 - 0.6  Sodium 136 - 145 mEq/L 137 - 135(L)  Potassium 3.5 - 5.1 mEq/L 4.1 - 4.3  Chloride 101 - 111 mmol/L - - -  CO2 22 - 29 mEq/L 29 - 29  Calcium 8.4 - 10.4 mg/dL 9.7 - 9.6  Total Protein 6.4 - 8.3 g/dL 8.9(H)  8.5 9.2(H)  Total Bilirubin 0.20 - 1.20 mg/dL 0.76 - 0.94  Alkaline Phos 40 - 150 U/L 430(H) - 611(H)  AST 5 - 34 U/L 53(H) - 58(H)  ALT 0 - 55 U/L 18 - 17          RADIOGRAPHIC STUDIES: I have personally reviewed the radiological images as listed and agreed with the findings in the report. No results found.   ASSESSMENT & PLAN:   71 year old Caucasian female with severe osteoporosis and vertebral compression fractures  #1 IgA kappa Smoldering Multiple myeloma    M protein level gradually increasing from 1.5-->1.8 ---> 1.9 Labs with no signs of anemia, hypercalcemia or renal failure. No overt focal new bone pains. Bm Bx shows 21% abnormal plasma cells. Rpt Bone Survey on 05/12/2016 -- showed no evidence of lytic lesions. Plan -no clear indication of progression to multiple myeloma at this time. -SPEP and K/L SFLC levels were reviewed  -hold off on definitive treatments for multiple myeloma unless there is any overt evidence of end organ injury meeting criteria for multiple myeloma. -Return to care with Dr. Irene Limbo in 4 months with repeat CBC, CMP and myeloma panel.  #2 liver cirrhosis with known varices Plan -Counseled on absolute alcohol cessation. -continue f/u with PCP  #3 osteoporosis with vertebral compression fractures.  #4 ?? history of osteonecrosis of the jaw with Fosamax/bisphosphonates previously . Unclear if this truly represents ONJ ---patient decribes it as "having brittle teeth" Plan  -Patient has been following with orthopedics Dr. Wandra Feinstein to manage her osteoporosis . -continue aggressive vitamin D replacement . -she is now following with a dentist to address her dental issues prior to use of Bisphosphonates for osteoporosis -she is currently on calcitonin per Dr Layne Benton (ALK PO4 levels are down to 430 from >600) -following with orthopedics for recent fall with traumatic tibial plateau fracture.  RTC with Dr Irene Limbo in 4 months with rpt labs.  All  of the patients and her female partners questions were answered to their apparent satisfaction. The patient knows to call the clinic with any problems, questions or concerns.  I spent 20 minutes counseling the patient face to face. The total time spent in the appointment was 25 minutes and more than 50% was on counseling and direct patient cares.    Sullivan Lone MD [place] AAHIVMS [facility] Hematology/Oncology Physician [facility]  (Office):       [phone] (Work cell):  [phone] (Fax):           [phone]

## 2017-01-25 NOTE — Telephone Encounter (Signed)
Scheduled appt per 6/28 los - patient is aware of appt time and date.

## 2017-01-28 DIAGNOSIS — M810 Age-related osteoporosis without current pathological fracture: Secondary | ICD-10-CM | POA: Diagnosis not present

## 2017-01-28 DIAGNOSIS — M80062D Age-related osteoporosis with current pathological fracture, left lower leg, subsequent encounter for fracture with routine healing: Secondary | ICD-10-CM | POA: Diagnosis not present

## 2017-01-28 DIAGNOSIS — K7030 Alcoholic cirrhosis of liver without ascites: Secondary | ICD-10-CM | POA: Diagnosis not present

## 2017-01-28 DIAGNOSIS — S51812D Laceration without foreign body of left forearm, subsequent encounter: Secondary | ICD-10-CM | POA: Diagnosis not present

## 2017-01-28 DIAGNOSIS — M8008XD Age-related osteoporosis with current pathological fracture, vertebra(e), subsequent encounter for fracture with routine healing: Secondary | ICD-10-CM | POA: Diagnosis not present

## 2017-01-28 DIAGNOSIS — C9000 Multiple myeloma not having achieved remission: Secondary | ICD-10-CM | POA: Diagnosis not present

## 2017-02-01 DIAGNOSIS — M8008XD Age-related osteoporosis with current pathological fracture, vertebra(e), subsequent encounter for fracture with routine healing: Secondary | ICD-10-CM | POA: Diagnosis not present

## 2017-02-01 DIAGNOSIS — F17200 Nicotine dependence, unspecified, uncomplicated: Secondary | ICD-10-CM | POA: Diagnosis not present

## 2017-02-01 DIAGNOSIS — M80062D Age-related osteoporosis with current pathological fracture, left lower leg, subsequent encounter for fracture with routine healing: Secondary | ICD-10-CM | POA: Diagnosis not present

## 2017-02-01 DIAGNOSIS — J449 Chronic obstructive pulmonary disease, unspecified: Secondary | ICD-10-CM | POA: Diagnosis not present

## 2017-02-01 DIAGNOSIS — K7030 Alcoholic cirrhosis of liver without ascites: Secondary | ICD-10-CM | POA: Diagnosis not present

## 2017-02-01 DIAGNOSIS — C9000 Multiple myeloma not having achieved remission: Secondary | ICD-10-CM | POA: Diagnosis not present

## 2017-02-02 ENCOUNTER — Telehealth: Payer: Self-pay

## 2017-02-02 NOTE — Telephone Encounter (Signed)
Per Dr. Irene Limbo, left message to relay that M protein levels slightly increased. Pt already scheduled for 4 month f/u per Melissa.

## 2017-02-03 DIAGNOSIS — M546 Pain in thoracic spine: Secondary | ICD-10-CM | POA: Diagnosis not present

## 2017-02-03 DIAGNOSIS — M8008XD Age-related osteoporosis with current pathological fracture, vertebra(e), subsequent encounter for fracture with routine healing: Secondary | ICD-10-CM | POA: Diagnosis not present

## 2017-02-03 DIAGNOSIS — M545 Low back pain: Secondary | ICD-10-CM | POA: Diagnosis not present

## 2017-02-03 DIAGNOSIS — C9000 Multiple myeloma not having achieved remission: Secondary | ICD-10-CM | POA: Diagnosis not present

## 2017-02-03 DIAGNOSIS — M80062D Age-related osteoporosis with current pathological fracture, left lower leg, subsequent encounter for fracture with routine healing: Secondary | ICD-10-CM | POA: Diagnosis not present

## 2017-02-03 DIAGNOSIS — M810 Age-related osteoporosis without current pathological fracture: Secondary | ICD-10-CM | POA: Diagnosis not present

## 2017-02-03 DIAGNOSIS — K7030 Alcoholic cirrhosis of liver without ascites: Secondary | ICD-10-CM | POA: Diagnosis not present

## 2017-02-03 DIAGNOSIS — F17200 Nicotine dependence, unspecified, uncomplicated: Secondary | ICD-10-CM | POA: Diagnosis not present

## 2017-02-03 DIAGNOSIS — J449 Chronic obstructive pulmonary disease, unspecified: Secondary | ICD-10-CM | POA: Diagnosis not present

## 2017-02-04 ENCOUNTER — Telehealth: Payer: Self-pay

## 2017-02-04 NOTE — Telephone Encounter (Signed)
Per Dr. Irene Limbo labs sent to PCP per request. Confirmed fax receipt.

## 2017-02-10 DIAGNOSIS — F17200 Nicotine dependence, unspecified, uncomplicated: Secondary | ICD-10-CM | POA: Diagnosis not present

## 2017-02-10 DIAGNOSIS — C9000 Multiple myeloma not having achieved remission: Secondary | ICD-10-CM | POA: Diagnosis not present

## 2017-02-10 DIAGNOSIS — J449 Chronic obstructive pulmonary disease, unspecified: Secondary | ICD-10-CM | POA: Diagnosis not present

## 2017-02-10 DIAGNOSIS — M8008XD Age-related osteoporosis with current pathological fracture, vertebra(e), subsequent encounter for fracture with routine healing: Secondary | ICD-10-CM | POA: Diagnosis not present

## 2017-02-10 DIAGNOSIS — M80062D Age-related osteoporosis with current pathological fracture, left lower leg, subsequent encounter for fracture with routine healing: Secondary | ICD-10-CM | POA: Diagnosis not present

## 2017-02-10 DIAGNOSIS — K7030 Alcoholic cirrhosis of liver without ascites: Secondary | ICD-10-CM | POA: Diagnosis not present

## 2017-02-11 DIAGNOSIS — J449 Chronic obstructive pulmonary disease, unspecified: Secondary | ICD-10-CM | POA: Diagnosis not present

## 2017-02-11 DIAGNOSIS — K7030 Alcoholic cirrhosis of liver without ascites: Secondary | ICD-10-CM | POA: Diagnosis not present

## 2017-02-11 DIAGNOSIS — M8008XD Age-related osteoporosis with current pathological fracture, vertebra(e), subsequent encounter for fracture with routine healing: Secondary | ICD-10-CM | POA: Diagnosis not present

## 2017-02-11 DIAGNOSIS — M80062D Age-related osteoporosis with current pathological fracture, left lower leg, subsequent encounter for fracture with routine healing: Secondary | ICD-10-CM | POA: Diagnosis not present

## 2017-02-11 DIAGNOSIS — C9000 Multiple myeloma not having achieved remission: Secondary | ICD-10-CM | POA: Diagnosis not present

## 2017-02-11 DIAGNOSIS — F17200 Nicotine dependence, unspecified, uncomplicated: Secondary | ICD-10-CM | POA: Diagnosis not present

## 2017-02-16 DIAGNOSIS — C9000 Multiple myeloma not having achieved remission: Secondary | ICD-10-CM | POA: Diagnosis not present

## 2017-02-16 DIAGNOSIS — K7030 Alcoholic cirrhosis of liver without ascites: Secondary | ICD-10-CM | POA: Diagnosis not present

## 2017-02-16 DIAGNOSIS — M80062D Age-related osteoporosis with current pathological fracture, left lower leg, subsequent encounter for fracture with routine healing: Secondary | ICD-10-CM | POA: Diagnosis not present

## 2017-02-16 DIAGNOSIS — F17200 Nicotine dependence, unspecified, uncomplicated: Secondary | ICD-10-CM | POA: Diagnosis not present

## 2017-02-16 DIAGNOSIS — M8008XD Age-related osteoporosis with current pathological fracture, vertebra(e), subsequent encounter for fracture with routine healing: Secondary | ICD-10-CM | POA: Diagnosis not present

## 2017-02-16 DIAGNOSIS — J449 Chronic obstructive pulmonary disease, unspecified: Secondary | ICD-10-CM | POA: Diagnosis not present

## 2017-02-18 DIAGNOSIS — F17200 Nicotine dependence, unspecified, uncomplicated: Secondary | ICD-10-CM | POA: Diagnosis not present

## 2017-02-18 DIAGNOSIS — J449 Chronic obstructive pulmonary disease, unspecified: Secondary | ICD-10-CM | POA: Diagnosis not present

## 2017-02-18 DIAGNOSIS — C9000 Multiple myeloma not having achieved remission: Secondary | ICD-10-CM | POA: Diagnosis not present

## 2017-02-18 DIAGNOSIS — M80062D Age-related osteoporosis with current pathological fracture, left lower leg, subsequent encounter for fracture with routine healing: Secondary | ICD-10-CM | POA: Diagnosis not present

## 2017-02-18 DIAGNOSIS — K7030 Alcoholic cirrhosis of liver without ascites: Secondary | ICD-10-CM | POA: Diagnosis not present

## 2017-02-18 DIAGNOSIS — M8008XD Age-related osteoporosis with current pathological fracture, vertebra(e), subsequent encounter for fracture with routine healing: Secondary | ICD-10-CM | POA: Diagnosis not present

## 2017-02-24 DIAGNOSIS — J449 Chronic obstructive pulmonary disease, unspecified: Secondary | ICD-10-CM | POA: Diagnosis not present

## 2017-02-24 DIAGNOSIS — M80062D Age-related osteoporosis with current pathological fracture, left lower leg, subsequent encounter for fracture with routine healing: Secondary | ICD-10-CM | POA: Diagnosis not present

## 2017-02-24 DIAGNOSIS — S82142D Displaced bicondylar fracture of left tibia, subsequent encounter for closed fracture with routine healing: Secondary | ICD-10-CM | POA: Diagnosis not present

## 2017-02-24 DIAGNOSIS — F410 Panic disorder [episodic paroxysmal anxiety] without agoraphobia: Secondary | ICD-10-CM | POA: Diagnosis not present

## 2017-02-24 DIAGNOSIS — M8008XD Age-related osteoporosis with current pathological fracture, vertebra(e), subsequent encounter for fracture with routine healing: Secondary | ICD-10-CM | POA: Diagnosis not present

## 2017-02-24 DIAGNOSIS — R0602 Shortness of breath: Secondary | ICD-10-CM | POA: Diagnosis not present

## 2017-02-24 DIAGNOSIS — F17200 Nicotine dependence, unspecified, uncomplicated: Secondary | ICD-10-CM | POA: Diagnosis not present

## 2017-02-24 DIAGNOSIS — R000 Tachycardia, unspecified: Secondary | ICD-10-CM | POA: Diagnosis not present

## 2017-02-24 DIAGNOSIS — C9000 Multiple myeloma not having achieved remission: Secondary | ICD-10-CM | POA: Diagnosis not present

## 2017-02-24 DIAGNOSIS — K7030 Alcoholic cirrhosis of liver without ascites: Secondary | ICD-10-CM | POA: Diagnosis not present

## 2017-02-25 DIAGNOSIS — F17200 Nicotine dependence, unspecified, uncomplicated: Secondary | ICD-10-CM | POA: Diagnosis not present

## 2017-02-25 DIAGNOSIS — M8008XD Age-related osteoporosis with current pathological fracture, vertebra(e), subsequent encounter for fracture with routine healing: Secondary | ICD-10-CM | POA: Diagnosis not present

## 2017-02-25 DIAGNOSIS — K7030 Alcoholic cirrhosis of liver without ascites: Secondary | ICD-10-CM | POA: Diagnosis not present

## 2017-02-25 DIAGNOSIS — C9000 Multiple myeloma not having achieved remission: Secondary | ICD-10-CM | POA: Diagnosis not present

## 2017-02-25 DIAGNOSIS — J449 Chronic obstructive pulmonary disease, unspecified: Secondary | ICD-10-CM | POA: Diagnosis not present

## 2017-02-25 DIAGNOSIS — M80062D Age-related osteoporosis with current pathological fracture, left lower leg, subsequent encounter for fracture with routine healing: Secondary | ICD-10-CM | POA: Diagnosis not present

## 2017-03-01 DIAGNOSIS — F17200 Nicotine dependence, unspecified, uncomplicated: Secondary | ICD-10-CM | POA: Diagnosis not present

## 2017-03-01 DIAGNOSIS — M8008XD Age-related osteoporosis with current pathological fracture, vertebra(e), subsequent encounter for fracture with routine healing: Secondary | ICD-10-CM | POA: Diagnosis not present

## 2017-03-01 DIAGNOSIS — J449 Chronic obstructive pulmonary disease, unspecified: Secondary | ICD-10-CM | POA: Diagnosis not present

## 2017-03-01 DIAGNOSIS — C9000 Multiple myeloma not having achieved remission: Secondary | ICD-10-CM | POA: Diagnosis not present

## 2017-03-01 DIAGNOSIS — K7030 Alcoholic cirrhosis of liver without ascites: Secondary | ICD-10-CM | POA: Diagnosis not present

## 2017-03-01 DIAGNOSIS — M80062D Age-related osteoporosis with current pathological fracture, left lower leg, subsequent encounter for fracture with routine healing: Secondary | ICD-10-CM | POA: Diagnosis not present

## 2017-03-02 ENCOUNTER — Ambulatory Visit (INDEPENDENT_AMBULATORY_CARE_PROVIDER_SITE_OTHER): Payer: Medicare Other | Admitting: Internal Medicine

## 2017-03-02 ENCOUNTER — Encounter: Payer: Self-pay | Admitting: Internal Medicine

## 2017-03-02 ENCOUNTER — Ambulatory Visit (INDEPENDENT_AMBULATORY_CARE_PROVIDER_SITE_OTHER)
Admission: RE | Admit: 2017-03-02 | Discharge: 2017-03-02 | Disposition: A | Discharge status: Home / Self Care | Payer: Medicare Other | Source: Ambulatory Visit | Attending: Internal Medicine | Admitting: Internal Medicine

## 2017-03-02 ENCOUNTER — Encounter: Payer: Medicare Other | Admitting: Adult Health

## 2017-03-02 VITALS — BP 142/80 | HR 93 | Ht 59.0 in | Wt 80.0 lb

## 2017-03-02 DIAGNOSIS — J449 Chronic obstructive pulmonary disease, unspecified: Secondary | ICD-10-CM

## 2017-03-02 DIAGNOSIS — R0602 Shortness of breath: Secondary | ICD-10-CM | POA: Diagnosis not present

## 2017-03-02 MED ORDER — TIOTROPIUM BROMIDE MONOHYDRATE 2.5 MCG/ACT IN AERS: 2.0000 | INHALATION_SPRAY | Freq: Every day | RESPIRATORY_TRACT | 11 refills | Status: DC

## 2017-03-02 MED ORDER — TIOTROPIUM BROMIDE MONOHYDRATE 2.5 MCG/ACT IN AERS: 2.0000 | INHALATION_SPRAY | Freq: Every day | RESPIRATORY_TRACT | 0 refills | Status: DC

## 2017-03-02 NOTE — Patient Instructions (Addendum)
Plan A = Automatic = Spiriva 2.5mg   X 2 pffs each am  Work on inhaler technique:  relax and gently blow all the way out then take a nice smooth deep breath back in, triggering the inhaler at same time you start breathing in.  Hold for up to 5 seconds if you can. Blow out thru nose. Rinse and gargle with water when done      Plan B = Backup Only use your albuterol (ventolin) as a rescue medication to be used if you can't catch your breath by resting or doing a relaxed purse lip breathing pattern.  - The less you use it, the better it will work when you need it. - Ok to use the inhaler up to 2 puffs  every 4 hours if you must but call for appointment if use goes up over your usual need - Don't leave home without it !!  (think of it like the spare tire for your car)   Strongly prefer in this setting if need a beta blocker:  bisoprolol the most selective generic choice  on the market.   Please remember to go to the  x-ray department downstairs in the basement  for your tests - we will call you with the results when they are available.      Please schedule a follow up office visit in 6 weeks, call sooner if needed with full pfts

## 2017-03-02 NOTE — Progress Notes (Signed)
Subjective:    Patient ID: Cassidy Barrett, female    DOB: [birth year]   MRN: [id number]  HPI 71 yo former smoker (04/2014) referred by [facility] after hospitalization for pleural effusion, CAP and COPD exacerbation . Patient has alcoholic cirrhosis, ascites, hyponatremia   06/25/2014  [facility] follow up / NP eval  Patient was admitted October 19 through October 30 for COPD exacerbation, acute hypoxic respiratory failure with community-acquired pneumonia and a right pleural effusion She was treated with IV antibiotics. CT chest showed a moderate layering right pleural effusion with compression of the right lung. CT abdomen showed possible liver cirrhosis with ascites and a 3-4 cm soft tissue mass in the rectum. Labs were remarkable for elevated LFTs and low sodium She underwent a paracentesis with 140 cc removed. She was seen by nephrology, and was treated with hypertonic IV fluids and salt tablets with improvement of her sodium Sodium returned to normal prior to discharge. Patient was seen by gastroenterology and underwent colonoscopy with polyp removal , path showed a tubovillous adenoma.  No formal dx of COPD in past. Has quit smoking since discharge. Denies any alcohol intake since discharge Since discharge, she is feeling improved. She has decreased shortness of breath or cough. Chest x-ray today is improved with resolution of pleural effusion. She denies any chest pain, orthopnea, PND or leg swelling She has home health and physical therapy at home Feels that her strength is improving. rec Great job on not smoking -keep up good work.  Follow up Dr. Gwenette Greet in 4-6 weeks with PFT > did not return    03/02/2017 1st [facility] office visit/ re-establish care/ Sanaai Doane  / stopped smoking 02/24/17  Chief Complaint  Patient presents with  . Pulmonary Consult    Referred by PCP. Pt c/o SOB for the past wk. She states she gets SOB with walking short distances such as from room to room at  home. She also gets SOB lying down and wakes up feeling breathless.   best  Day =  3 weeks prior to OV  Limited by knee and back more than doe and no need for inhalers Onset of tachycardia was 4 weeks prior to OV  > rx metaprolol around 3 weeks prior to OV worse sob so > coreg 8/1-8/5   But no better and then improved off coreg x 48 h prior to OV   rx ventolin seems to help only used 20 doses since this flare  Doe = MMRC4  = sob if tries to leave home or while getting dressed   Also nightly sob unless sleeps upright x 3 weeks    No obvious day to day or daytime variability or assoc excess/ purulent sputum or mucus plugs or hemoptysis or cp or chest tightness, subjective wheeze or overt sinus or hb symptoms. No unusual exp hx or h/o childhood pna/ asthma or knowledge of premature birth.  Sleeping ok without nocturnal  or early am exacerbation  of respiratory  c/o's or need for noct saba. Also denies any obvious fluctuation of symptoms with weather or environmental changes or other aggravating or alleviating factors except as outlined above   Current Medications, Allergies, Complete Past Medical History, Past Surgical History, Family History, and Social History were reviewed in [employer] record.  ROS  The following are not active complaints unless bolded sore throat, dysphagia, dental problems, itching, sneezing,  nasal congestion or excess/ purulent secretions, ear ache,   fever, chills, sweats, unintended wt  loss, classically pleuritic or exertional cp,  orthopnea pnd or leg swelling, presyncope, palpitations, abdominal pain, anorexia, nausea, vomiting, diarrhea  or change in bowel or bladder habits, change in stools or urine, dysuria,hematuria,  rash, arthralgias, visual complaints, headache, numbness, weakness or ataxia or problems with walking or coordination,  change in mood/affect or memory.                   Objective:   Physical Exam    W/c bound elderly  wf nad >> stated age   Wt Readings from Last 3 Encounters:  03/02/17 80 lb (36.3 kg)  01/21/17 81 lb 9.6 oz (37 kg)  01/01/17 80 lb 12.8 oz (36.7 kg)    Vital signs reviewed  - Note on arrival 02 sats  92% on RA     HEENT: nl   turbinates bilaterally, and oropharynx. Nl external ear canals without cough reflex - poor dentition    NECK :  without JVD/Nodes/TM/ nl carotid upstrokes bilaterally   LUNGS: no acc muscle use,  Barrel contour chest/kyphotic with  distant bs with  mid bilateral exp wheezes   CV:  RRR  no s3 or murmur or increase in P2, and no edema   ABD:  soft and nontender with nl inspiratory excursion in the supine position. No bruits or organomegaly appreciated, bowel sounds nl  MS:   ext warm without deformities, calf tenderness, cyanosis or clubbing No obvious joint restrictions   SKIN: warm and dry without lesions    NEURO:  alert, approp, nl sensorium with  no motor or cerebellar deficits apparent.    CXR PA and Lateral:   03/02/2017 :    I personally reviewed images and   impression as follows:   Severe copd/ kyphosis       Assessment & Plan:

## 2017-03-03 ENCOUNTER — Telehealth: Payer: Self-pay | Admitting: Internal Medicine

## 2017-03-03 DIAGNOSIS — M8008XD Age-related osteoporosis with current pathological fracture, vertebra(e), subsequent encounter for fracture with routine healing: Secondary | ICD-10-CM | POA: Diagnosis not present

## 2017-03-03 DIAGNOSIS — K7030 Alcoholic cirrhosis of liver without ascites: Secondary | ICD-10-CM | POA: Diagnosis not present

## 2017-03-03 DIAGNOSIS — F17200 Nicotine dependence, unspecified, uncomplicated: Secondary | ICD-10-CM | POA: Diagnosis not present

## 2017-03-03 DIAGNOSIS — C9000 Multiple myeloma not having achieved remission: Secondary | ICD-10-CM | POA: Diagnosis not present

## 2017-03-03 DIAGNOSIS — M80062D Age-related osteoporosis with current pathological fracture, left lower leg, subsequent encounter for fracture with routine healing: Secondary | ICD-10-CM | POA: Diagnosis not present

## 2017-03-03 DIAGNOSIS — J449 Chronic obstructive pulmonary disease, unspecified: Secondary | ICD-10-CM | POA: Diagnosis not present

## 2017-03-03 NOTE — Progress Notes (Signed)
Spoke with pt and notified of results per Dr. Wert. Pt verbalized understanding and denied any questions. 

## 2017-03-03 NOTE — Progress Notes (Signed)
LMTCB

## 2017-03-03 NOTE — Telephone Encounter (Signed)
Notes recorded by Rosana Berger, CMA on 03/03/2017 at 12:01 PM EDT [facility] ------  Notes recorded by Tanda Rockers, MD on 03/03/2017 at 11:44 AM EDT Call pt: Reviewed cxr and no acute change so no change in recommendations made at ov  Pt aware of results.

## 2017-03-03 NOTE — Assessment & Plan Note (Addendum)
Quit smoking 02/24/17 -  Spirometry 03/02/2017  FEV1 0.27 (14%)  Ratio 40 with severe curvature 15 min p saba - 03/02/2017  After extensive coaching device  effectiveness =   75% (short Ti) with smi > try spiriva 2.5  pfts and hx support very severe copd in pt chronically limited by back and legs, not sob, and continued to smoke until smoking literally stopped her.   Her acute flare appears to have been driven by BB intended to treat palpitations so best to avoid LABA here and just use LAMA and prn saba   Also Strongly prefer in this setting: Bystolic, the most beta -1  selective Beta blocker available in sample form, with bisoprolol the most selective generic choice  on the market, should a beta blocker be needed in the future  Total time devoted to counseling  > 50 % of initial 60 min office visit:  review case with pt/ discussion of options/alternatives/ personally creating written customized instructions  in presence of pt  then going over those specific  Instructions directly with the pt including how to use all of the meds but in particular covering each new medication in detail and the difference between the maintenance= "automatic" meds and the prns using an action plan format for the latter (If this problem/symptom => do that organization reading Left to right).  Please see AVS from this visit for a full list of these instructions which I personally wrote for this pt and  are unique to this visit.

## 2017-03-04 DIAGNOSIS — K7030 Alcoholic cirrhosis of liver without ascites: Secondary | ICD-10-CM | POA: Diagnosis not present

## 2017-03-04 DIAGNOSIS — F17200 Nicotine dependence, unspecified, uncomplicated: Secondary | ICD-10-CM | POA: Diagnosis not present

## 2017-03-04 DIAGNOSIS — J449 Chronic obstructive pulmonary disease, unspecified: Secondary | ICD-10-CM | POA: Diagnosis not present

## 2017-03-04 DIAGNOSIS — C9000 Multiple myeloma not having achieved remission: Secondary | ICD-10-CM | POA: Diagnosis not present

## 2017-03-04 DIAGNOSIS — M8008XD Age-related osteoporosis with current pathological fracture, vertebra(e), subsequent encounter for fracture with routine healing: Secondary | ICD-10-CM | POA: Diagnosis not present

## 2017-03-04 DIAGNOSIS — M80062D Age-related osteoporosis with current pathological fracture, left lower leg, subsequent encounter for fracture with routine healing: Secondary | ICD-10-CM | POA: Diagnosis not present

## 2017-03-05 DIAGNOSIS — M80062D Age-related osteoporosis with current pathological fracture, left lower leg, subsequent encounter for fracture with routine healing: Secondary | ICD-10-CM | POA: Diagnosis not present

## 2017-03-05 DIAGNOSIS — K7030 Alcoholic cirrhosis of liver without ascites: Secondary | ICD-10-CM | POA: Diagnosis not present

## 2017-03-05 DIAGNOSIS — J449 Chronic obstructive pulmonary disease, unspecified: Secondary | ICD-10-CM | POA: Diagnosis not present

## 2017-03-05 DIAGNOSIS — C9000 Multiple myeloma not having achieved remission: Secondary | ICD-10-CM | POA: Diagnosis not present

## 2017-03-05 DIAGNOSIS — M8008XD Age-related osteoporosis with current pathological fracture, vertebra(e), subsequent encounter for fracture with routine healing: Secondary | ICD-10-CM | POA: Diagnosis not present

## 2017-03-05 DIAGNOSIS — F17200 Nicotine dependence, unspecified, uncomplicated: Secondary | ICD-10-CM | POA: Diagnosis not present

## 2017-03-08 DIAGNOSIS — C9000 Multiple myeloma not having achieved remission: Secondary | ICD-10-CM | POA: Diagnosis not present

## 2017-03-08 DIAGNOSIS — M8008XD Age-related osteoporosis with current pathological fracture, vertebra(e), subsequent encounter for fracture with routine healing: Secondary | ICD-10-CM | POA: Diagnosis not present

## 2017-03-08 DIAGNOSIS — K7030 Alcoholic cirrhosis of liver without ascites: Secondary | ICD-10-CM | POA: Diagnosis not present

## 2017-03-08 DIAGNOSIS — M80062D Age-related osteoporosis with current pathological fracture, left lower leg, subsequent encounter for fracture with routine healing: Secondary | ICD-10-CM | POA: Diagnosis not present

## 2017-03-08 DIAGNOSIS — J449 Chronic obstructive pulmonary disease, unspecified: Secondary | ICD-10-CM | POA: Diagnosis not present

## 2017-03-08 DIAGNOSIS — F17200 Nicotine dependence, unspecified, uncomplicated: Secondary | ICD-10-CM | POA: Diagnosis not present

## 2017-03-10 DIAGNOSIS — C9000 Multiple myeloma not having achieved remission: Secondary | ICD-10-CM | POA: Diagnosis not present

## 2017-03-10 DIAGNOSIS — K7030 Alcoholic cirrhosis of liver without ascites: Secondary | ICD-10-CM | POA: Diagnosis not present

## 2017-03-10 DIAGNOSIS — F17200 Nicotine dependence, unspecified, uncomplicated: Secondary | ICD-10-CM | POA: Diagnosis not present

## 2017-03-10 DIAGNOSIS — M8008XD Age-related osteoporosis with current pathological fracture, vertebra(e), subsequent encounter for fracture with routine healing: Secondary | ICD-10-CM | POA: Diagnosis not present

## 2017-03-10 DIAGNOSIS — J449 Chronic obstructive pulmonary disease, unspecified: Secondary | ICD-10-CM | POA: Diagnosis not present

## 2017-03-10 DIAGNOSIS — M80062D Age-related osteoporosis with current pathological fracture, left lower leg, subsequent encounter for fracture with routine healing: Secondary | ICD-10-CM | POA: Diagnosis not present

## 2017-03-11 DIAGNOSIS — K7030 Alcoholic cirrhosis of liver without ascites: Secondary | ICD-10-CM | POA: Diagnosis not present

## 2017-03-11 DIAGNOSIS — F17200 Nicotine dependence, unspecified, uncomplicated: Secondary | ICD-10-CM | POA: Diagnosis not present

## 2017-03-11 DIAGNOSIS — M80062D Age-related osteoporosis with current pathological fracture, left lower leg, subsequent encounter for fracture with routine healing: Secondary | ICD-10-CM | POA: Diagnosis not present

## 2017-03-11 DIAGNOSIS — J449 Chronic obstructive pulmonary disease, unspecified: Secondary | ICD-10-CM | POA: Diagnosis not present

## 2017-03-11 DIAGNOSIS — M8008XD Age-related osteoporosis with current pathological fracture, vertebra(e), subsequent encounter for fracture with routine healing: Secondary | ICD-10-CM | POA: Diagnosis not present

## 2017-03-11 DIAGNOSIS — C9000 Multiple myeloma not having achieved remission: Secondary | ICD-10-CM | POA: Diagnosis not present

## 2017-03-17 ENCOUNTER — Telehealth: Payer: Self-pay | Admitting: Internal Medicine

## 2017-03-17 DIAGNOSIS — M8008XD Age-related osteoporosis with current pathological fracture, vertebra(e), subsequent encounter for fracture with routine healing: Secondary | ICD-10-CM | POA: Diagnosis not present

## 2017-03-17 DIAGNOSIS — F17200 Nicotine dependence, unspecified, uncomplicated: Secondary | ICD-10-CM | POA: Diagnosis not present

## 2017-03-17 DIAGNOSIS — J449 Chronic obstructive pulmonary disease, unspecified: Secondary | ICD-10-CM | POA: Diagnosis not present

## 2017-03-17 DIAGNOSIS — C9000 Multiple myeloma not having achieved remission: Secondary | ICD-10-CM | POA: Diagnosis not present

## 2017-03-17 DIAGNOSIS — K7030 Alcoholic cirrhosis of liver without ascites: Secondary | ICD-10-CM | POA: Diagnosis not present

## 2017-03-17 DIAGNOSIS — M80062D Age-related osteoporosis with current pathological fracture, left lower leg, subsequent encounter for fracture with routine healing: Secondary | ICD-10-CM | POA: Diagnosis not present

## 2017-03-17 MED ORDER — ALBUTEROL SULFATE HFA 108 (90 BASE) MCG/ACT IN AERS: 2.0000 | INHALATION_SPRAY | Freq: Four times a day (QID) | RESPIRATORY_TRACT | 2 refills | Status: DC | PRN

## 2017-03-17 MED ORDER — UMECLIDINIUM BROMIDE 62.5 MCG/INH IN AEPB: 1.0000 | INHALATION_SPRAY | Freq: Every day | RESPIRATORY_TRACT | 11 refills | Status: DC

## 2017-03-17 NOTE — Telephone Encounter (Signed)
Spoke with Cassidy Barrett. She is aware of MW's recs. Will go ahead and send in RX for Incruse. She verbalized understanding of the dosage and usage. Nothing else needed at time of call.

## 2017-03-17 NOTE — Telephone Encounter (Signed)
Spoke with [name]  I advised Ventolin sent to pharm  I called ins to verify PA is needed for Spiriva resp 2.5 and they stated that Incruse is the only covered alternative  Please advise if okay to send, thanks  Last FSF:SELT A = Automatic = Spiriva 2.5mg   X 2 pffs each am  Work on inhaler technique:  relax and gently blow all the way out then take a nice smooth deep breath back in, triggering the inhaler at same time you start breathing in.  Hold for up to 5 seconds if you can. Blow out thru nose. Rinse and gargle with water when done      Plan B = Backup Only use your albuterol (ventolin) as a rescue medication to be used if you can't catch your breath by resting or doing a relaxed purse lip breathing pattern.  - The less you use it, the better it will work when you need it. - Ok to use the inhaler up to 2 puffs  every 4 hours if you must but call for appointment if use goes up over your usual need - Don't leave home without it !!  (think of it like the spare tire for your car)   Strongly prefer in this setting if need a beta blocker:  bisoprolol the most selective generic choice  on the market.   Please remember to go to the  x-ray department downstairs in the basement  for your tests - we will call you with the results when they are available.      Please schedule a follow up office visit in 6 weeks, call sooner if needed with full pfts

## 2017-03-17 NOTE — Telephone Encounter (Signed)
That's fine one click each am but bring with her next ov so we can verify using correctly

## 2017-03-18 DIAGNOSIS — F17200 Nicotine dependence, unspecified, uncomplicated: Secondary | ICD-10-CM | POA: Diagnosis not present

## 2017-03-18 DIAGNOSIS — K7030 Alcoholic cirrhosis of liver without ascites: Secondary | ICD-10-CM | POA: Diagnosis not present

## 2017-03-18 DIAGNOSIS — M8008XD Age-related osteoporosis with current pathological fracture, vertebra(e), subsequent encounter for fracture with routine healing: Secondary | ICD-10-CM | POA: Diagnosis not present

## 2017-03-18 DIAGNOSIS — C9000 Multiple myeloma not having achieved remission: Secondary | ICD-10-CM | POA: Diagnosis not present

## 2017-03-18 DIAGNOSIS — M80062D Age-related osteoporosis with current pathological fracture, left lower leg, subsequent encounter for fracture with routine healing: Secondary | ICD-10-CM | POA: Diagnosis not present

## 2017-03-18 DIAGNOSIS — J449 Chronic obstructive pulmonary disease, unspecified: Secondary | ICD-10-CM | POA: Diagnosis not present

## 2017-03-20 IMAGING — MR MR THORACIC SPINE W/O CM
6 of 10 series · 26 of 48 positions shown · non-contrast
Comparison: CT chest 05/14/2014

CLINICAL DATA: Compression fracture. Felt acute back pain while
routinely walking in the house 2 months ago. Pain throughout the
thoracic and lumbar region.

EXAM:
MRI THORACIC AND LUMBAR SPINE WITHOUT CONTRAST
TECHNIQUE: Multiplanar and multiecho pulse sequences of the thoracic and lumbar
spine were obtained without intravenous contrast.

[Series 5: T2 · sagittal · 4.0mm · 0.50mm/px · 2 of 12 slices shown (1 of 4)]
[im 1/12]
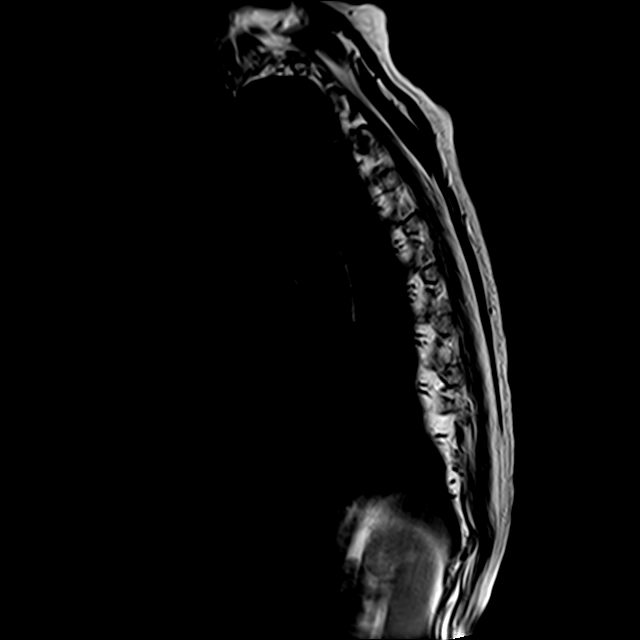
[im 12/12]
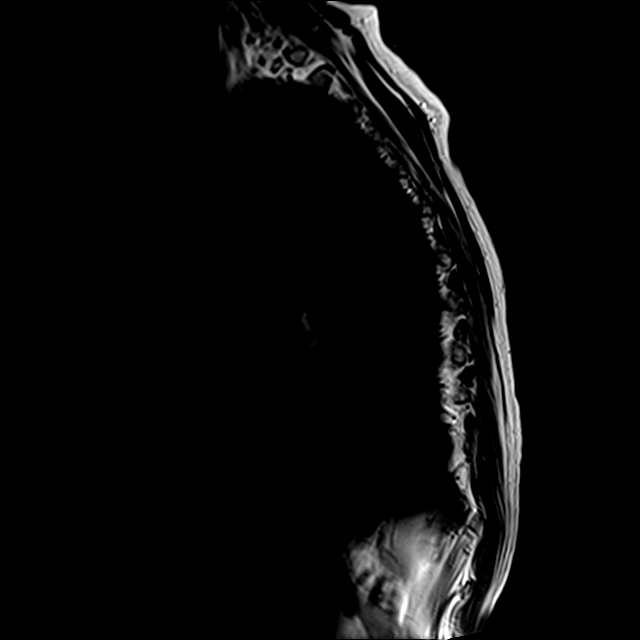

[Series 6: T1 · sagittal · 4.0mm · 0.50mm/px · 2 of 12 slices shown (1 of 2)]
[im 1/12]
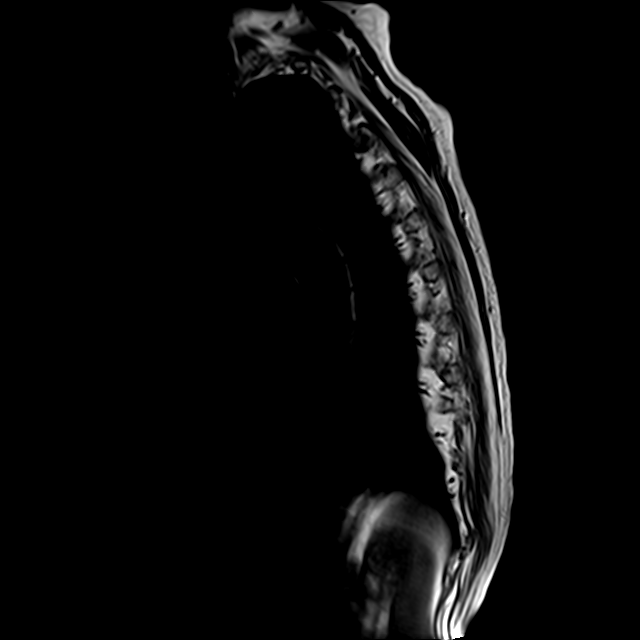
[im 12/12]
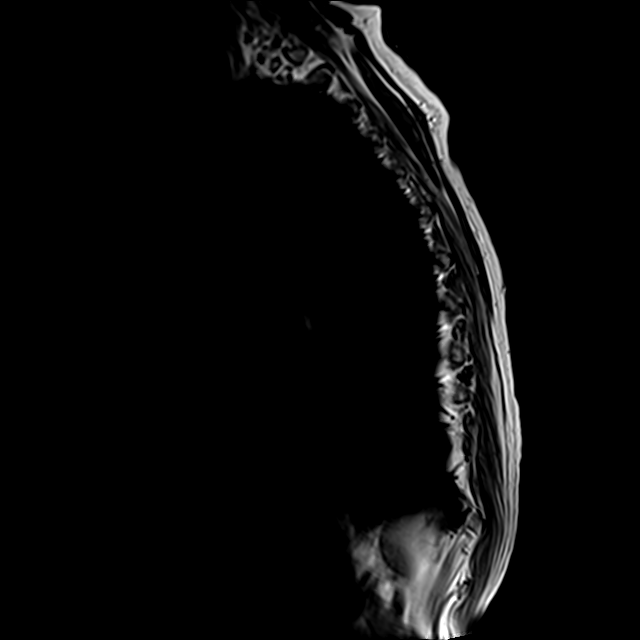

[Series 9: T2 · axial · 4.0mm · 0.70mm/px · z∈[-279,-70]mm · 8 of 41 slices shown (2 of 4)]
[im 1/41]
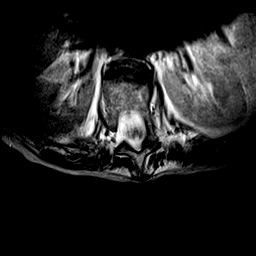
[im 6/41]
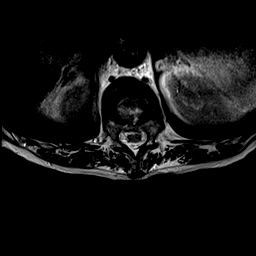
[im 12/41]
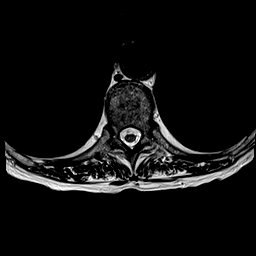
[im 18/41]
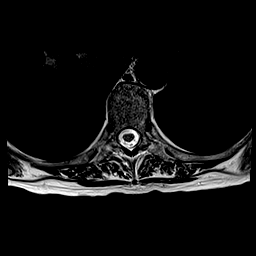
[im 23/41]
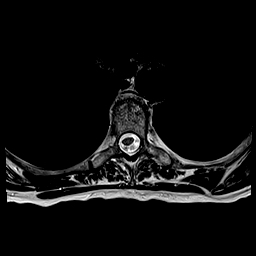
[im 29/41]
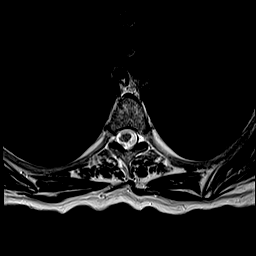
[im 35/41]
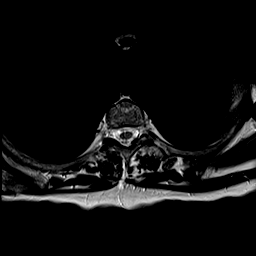
[im 41/41]
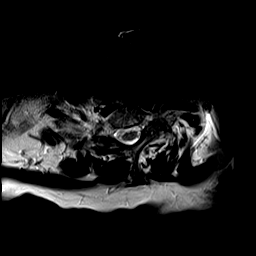

[Series 12: T2 · sagittal · 4.0mm · 0.44mm/px · 3 of 12 slices shown (3 of 4)]
[im 1/12]
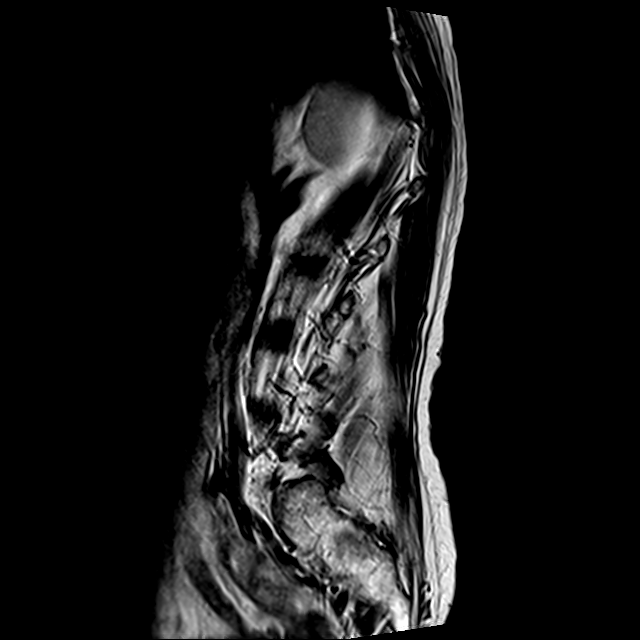
[im 6/12]
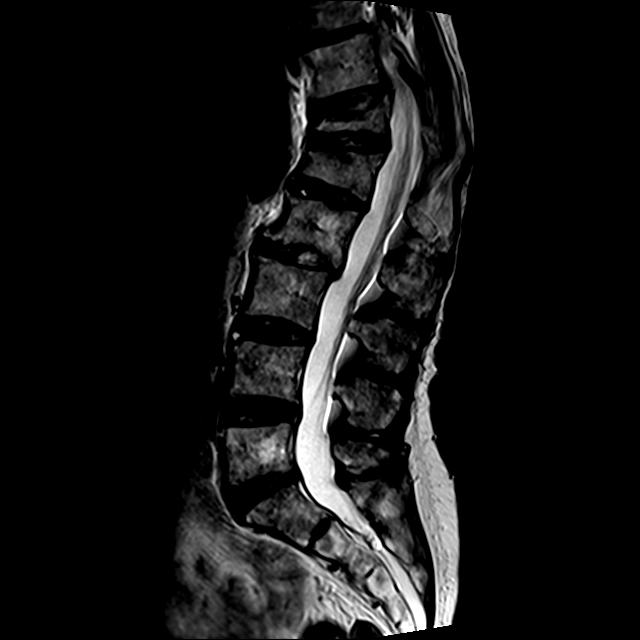
[im 12/12]
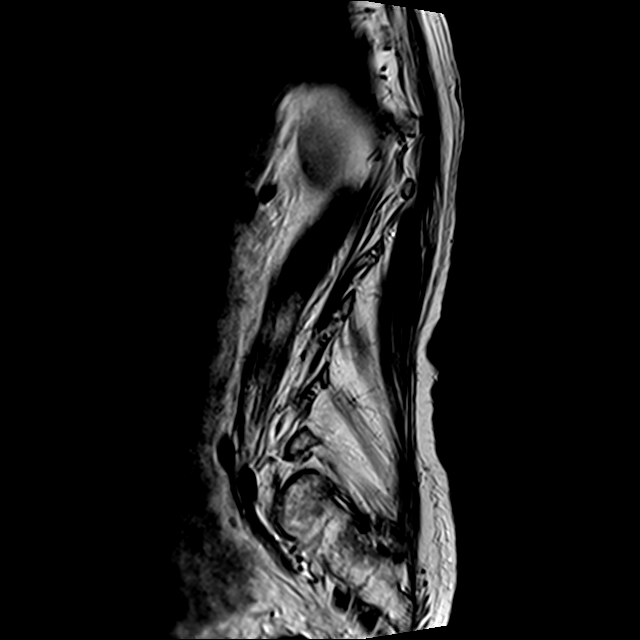

[Series 13: T1 · sagittal · 4.0mm · 0.55mm/px · 3 of 12 slices shown (2 of 2)]
[im 1/12]
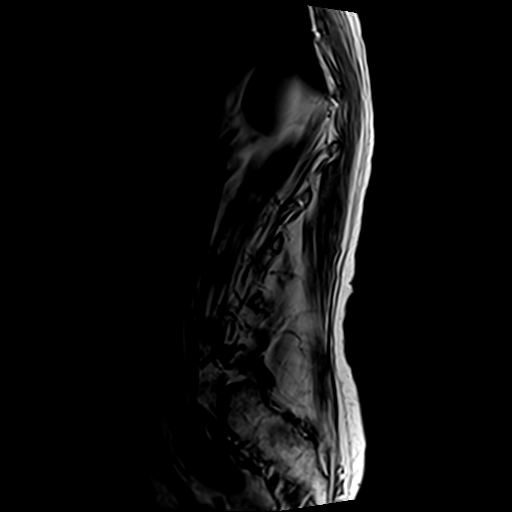
[im 6/12]
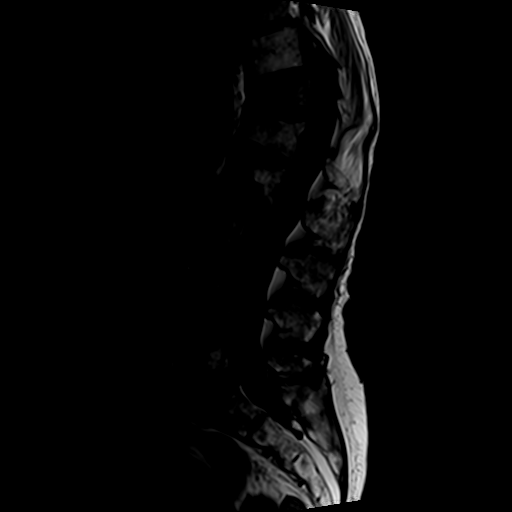
[im 12/12]
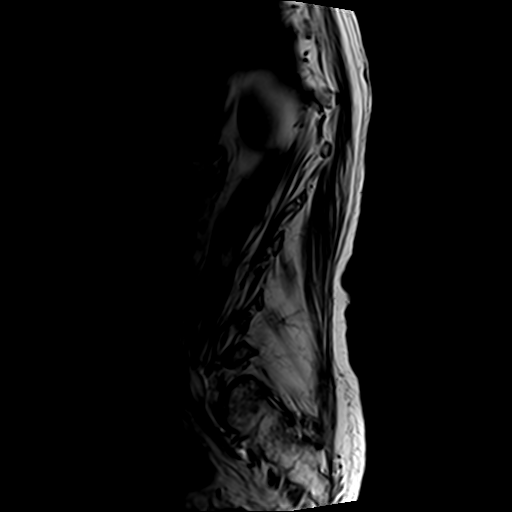

[Series 16: T2 · axial · 4.0mm · 0.74mm/px · z∈[-439,-234]mm · 8 of 38 slices shown (4 of 4)]
[im 1/38]
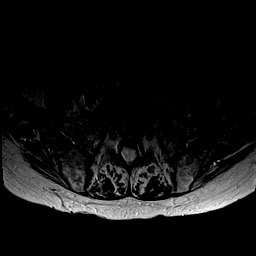
[im 6/38]
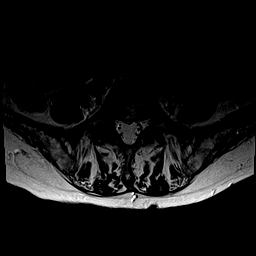
[im 11/38]
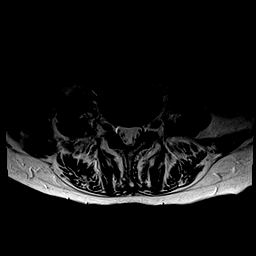
[im 16/38]
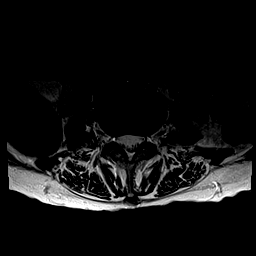
[im 22/38]
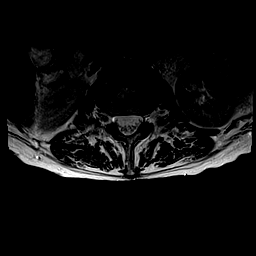
[im 27/38]
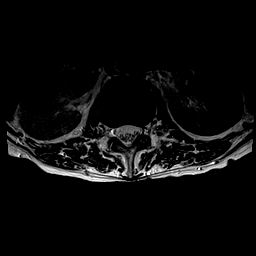
[im 32/38]
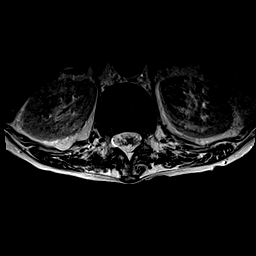
[im 38/38]
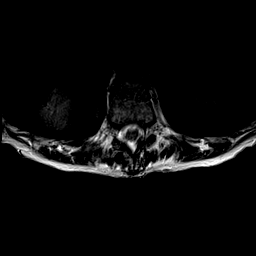

[26 of 48 positions shown; findings below may reference images not displayed]

FINDINGS: MR THORACIC SPINE FINDINGS

Scout view in the cervical region does not show a fracture. There is
spondylosis, not evaluated in detail.

In the thoracic spine, there is no fracture at T11 or above. There
is mild spinal curvature in there is ordinary mild disc degeneration
with a few minimal disc bulges but no herniation or any stenosis of
the canal or foramina. There is a late subacute compression fracture
at T12 that was not present on the CT of last [REDACTED]. There is loss
of height anteriorly of 60%. There is posterior bowing of the
posterior superior margin of the vertebral body by 5 mm. This
indents the thecal sac and deviates the cord slightly. Ample
subarachnoid spaces present dorsal to the cord. There is mild
residual edema in the healing vertebral body.

MR LUMBAR SPINE FINDINGS

At L1, there is an old healed compression fracture with loss of
height anteriorly of 50%. No retropulsed bone. No stenosis. No
fracture seen below that in the lumbar region.

L1-2:  Mild bulging of the disc.  No stenosis.

L2-3: Retrolisthesis of 2 mm. Desiccation and bulging of the disc.
No stenosis.

L3-4: Minimal disc bulge.  No stenosis.

L4-5: Minimal disc bulge.  No stenosis.

L5-S1: Minimal disc bulge.  No stenosis.

No sacral fracture.
IMPRESSION: MR THORACIC SPINE IMPRESSION

Late subacute compression fracture at T12 which was not present in
[REDACTED] of last year. Loss of height anteriorly of 60%. Retropulsed
bone of 5 mm encroaches upon the spinal canal but does not appear to
frankly compress the cord, although the cord does deviate
posteriorly slightly. Healing is not yet complete, as there is mild
residual marrow edema. There is no finding to suggest that this is
anything other than a benign osteoporotic fracture.

MR LUMBAR SPINE IMPRESSION

Old healed compression deformity at L1 without canal compromise. No
other fracture in the lumbar region. Mild non-compressive
degenerative changes below that.

## 2017-03-22 DIAGNOSIS — M8008XD Age-related osteoporosis with current pathological fracture, vertebra(e), subsequent encounter for fracture with routine healing: Secondary | ICD-10-CM | POA: Diagnosis not present

## 2017-03-22 DIAGNOSIS — J449 Chronic obstructive pulmonary disease, unspecified: Secondary | ICD-10-CM | POA: Diagnosis not present

## 2017-03-22 DIAGNOSIS — K7030 Alcoholic cirrhosis of liver without ascites: Secondary | ICD-10-CM | POA: Diagnosis not present

## 2017-03-22 DIAGNOSIS — M80062D Age-related osteoporosis with current pathological fracture, left lower leg, subsequent encounter for fracture with routine healing: Secondary | ICD-10-CM | POA: Diagnosis not present

## 2017-03-22 DIAGNOSIS — F17200 Nicotine dependence, unspecified, uncomplicated: Secondary | ICD-10-CM | POA: Diagnosis not present

## 2017-03-22 DIAGNOSIS — C9000 Multiple myeloma not having achieved remission: Secondary | ICD-10-CM | POA: Diagnosis not present

## 2017-03-25 DIAGNOSIS — M80062D Age-related osteoporosis with current pathological fracture, left lower leg, subsequent encounter for fracture with routine healing: Secondary | ICD-10-CM | POA: Diagnosis not present

## 2017-03-25 DIAGNOSIS — C9000 Multiple myeloma not having achieved remission: Secondary | ICD-10-CM | POA: Diagnosis not present

## 2017-03-25 DIAGNOSIS — J449 Chronic obstructive pulmonary disease, unspecified: Secondary | ICD-10-CM | POA: Diagnosis not present

## 2017-03-25 DIAGNOSIS — F17200 Nicotine dependence, unspecified, uncomplicated: Secondary | ICD-10-CM | POA: Diagnosis not present

## 2017-03-25 DIAGNOSIS — K7030 Alcoholic cirrhosis of liver without ascites: Secondary | ICD-10-CM | POA: Diagnosis not present

## 2017-03-25 DIAGNOSIS — M8008XD Age-related osteoporosis with current pathological fracture, vertebra(e), subsequent encounter for fracture with routine healing: Secondary | ICD-10-CM | POA: Diagnosis not present

## 2017-03-30 DIAGNOSIS — J449 Chronic obstructive pulmonary disease, unspecified: Secondary | ICD-10-CM | POA: Diagnosis not present

## 2017-03-30 DIAGNOSIS — F17200 Nicotine dependence, unspecified, uncomplicated: Secondary | ICD-10-CM | POA: Diagnosis not present

## 2017-03-30 DIAGNOSIS — M8008XD Age-related osteoporosis with current pathological fracture, vertebra(e), subsequent encounter for fracture with routine healing: Secondary | ICD-10-CM | POA: Diagnosis not present

## 2017-03-30 DIAGNOSIS — C9000 Multiple myeloma not having achieved remission: Secondary | ICD-10-CM | POA: Diagnosis not present

## 2017-03-30 DIAGNOSIS — K7030 Alcoholic cirrhosis of liver without ascites: Secondary | ICD-10-CM | POA: Diagnosis not present

## 2017-03-30 DIAGNOSIS — M80062D Age-related osteoporosis with current pathological fracture, left lower leg, subsequent encounter for fracture with routine healing: Secondary | ICD-10-CM | POA: Diagnosis not present

## 2017-04-01 DIAGNOSIS — J449 Chronic obstructive pulmonary disease, unspecified: Secondary | ICD-10-CM | POA: Diagnosis not present

## 2017-04-01 DIAGNOSIS — C9000 Multiple myeloma not having achieved remission: Secondary | ICD-10-CM | POA: Diagnosis not present

## 2017-04-01 DIAGNOSIS — M8008XD Age-related osteoporosis with current pathological fracture, vertebra(e), subsequent encounter for fracture with routine healing: Secondary | ICD-10-CM | POA: Diagnosis not present

## 2017-04-01 DIAGNOSIS — K7030 Alcoholic cirrhosis of liver without ascites: Secondary | ICD-10-CM | POA: Diagnosis not present

## 2017-04-01 DIAGNOSIS — M80062D Age-related osteoporosis with current pathological fracture, left lower leg, subsequent encounter for fracture with routine healing: Secondary | ICD-10-CM | POA: Diagnosis not present

## 2017-04-01 DIAGNOSIS — F17200 Nicotine dependence, unspecified, uncomplicated: Secondary | ICD-10-CM | POA: Diagnosis not present

## 2017-04-20 ENCOUNTER — Ambulatory Visit: Payer: Self-pay | Admitting: Internal Medicine

## 2017-04-20 ENCOUNTER — Encounter: Payer: Self-pay | Admitting: Internal Medicine

## 2017-05-07 ENCOUNTER — Other Ambulatory Visit: Payer: Self-pay | Admitting: Internal Medicine

## 2017-05-07 DIAGNOSIS — J449 Chronic obstructive pulmonary disease, unspecified: Secondary | ICD-10-CM

## 2017-05-10 DIAGNOSIS — J9801 Acute bronchospasm: Secondary | ICD-10-CM | POA: Diagnosis not present

## 2017-05-10 DIAGNOSIS — I10 Essential (primary) hypertension: Secondary | ICD-10-CM | POA: Diagnosis not present

## 2017-05-10 DIAGNOSIS — R05 Cough: Secondary | ICD-10-CM | POA: Diagnosis not present

## 2017-05-10 DIAGNOSIS — J209 Acute bronchitis, unspecified: Secondary | ICD-10-CM | POA: Diagnosis not present

## 2017-05-11 ENCOUNTER — Ambulatory Visit: Payer: Self-pay | Admitting: Internal Medicine

## 2017-05-12 DIAGNOSIS — J441 Chronic obstructive pulmonary disease with (acute) exacerbation: Secondary | ICD-10-CM | POA: Diagnosis not present

## 2017-05-12 DIAGNOSIS — I10 Essential (primary) hypertension: Secondary | ICD-10-CM | POA: Diagnosis not present

## 2017-05-26 ENCOUNTER — Ambulatory Visit (INDEPENDENT_AMBULATORY_CARE_PROVIDER_SITE_OTHER): Payer: Medicare Other | Admitting: Internal Medicine

## 2017-05-26 ENCOUNTER — Encounter: Payer: Self-pay | Admitting: Internal Medicine

## 2017-05-26 VITALS — BP 130/70 | HR 100 | Ht 59.0 in | Wt 75.0 lb

## 2017-05-26 DIAGNOSIS — J449 Chronic obstructive pulmonary disease, unspecified: Secondary | ICD-10-CM | POA: Diagnosis not present

## 2017-05-26 LAB — PULMONARY FUNCTION TEST
DL/VA % PRED: 69 %
DL/VA: 2.84 ml/min/mmHg/L
DLCO UNC % PRED: 56 %
DLCO cor % pred: 52 %
DLCO cor: 9.21 ml/min/mmHg
DLCO unc: 9.94 ml/min/mmHg
FEF 25-75 POST: 0.32 L/s
FEF 25-75 PRE: 0.29 L/s
FEF2575-%Change-Post: 9 %
FEF2575-%PRED-PRE: 17 %
FEF2575-%Pred-Post: 19 %
FEV1-%Change-Post: 3 %
FEV1-%Pred-Post: 39 %
FEV1-%Pred-Pre: 38 %
FEV1-PRE: 0.71 L
FEV1-Post: 0.73 L
FEV1FVC-%CHANGE-POST: 0 %
FEV1FVC-%Pred-Pre: 53 %
FEV6-%Change-Post: 2 %
FEV6-%PRED-PRE: 72 %
FEV6-%Pred-Post: 74 %
FEV6-PRE: 1.68 L
FEV6-Post: 1.73 L
FEV6FVC-%Change-Post: 0 %
FEV6FVC-%PRED-PRE: 100 %
FEV6FVC-%Pred-Post: 100 %
FVC-%CHANGE-POST: 2 %
FVC-%Pred-Post: 74 %
FVC-%Pred-Pre: 72 %
FVC-Post: 1.81 L
FVC-Pre: 1.76 L
POST FEV1/FVC RATIO: 40 %
POST FEV6/FVC RATIO: 95 %
PRE FEV1/FVC RATIO: 40 %
Pre FEV6/FVC Ratio: 96 %

## 2017-05-26 MED ORDER — UMECLIDINIUM BROMIDE 62.5 MCG/INH IN AEPB: 1.0000 | INHALATION_SPRAY | Freq: Every day | RESPIRATORY_TRACT | 3 refills | Status: DC

## 2017-05-26 NOTE — Progress Notes (Signed)
PFT done today by Alfonzia Woolum, CMA  

## 2017-05-26 NOTE — Patient Instructions (Addendum)
Plan A = Automatic = Incruse  or Anoro one click  each am - take two good drags smooth and deep and then rinse your mouth    Plan B = Backup Only use your albuterol as a rescue medication to be used if you can't catch your breath by resting or doing a relaxed purse lip breathing pattern.  - The less you use it, the better it will work when you need it. - Ok to use the inhaler up to 2 puffs  every 4 hours if you must but call for appointment if use goes up over your usual need - Don't leave home without it !!  (think of it like the spare tire for your car)    Please schedule a follow up visit in 3 months but call sooner if needed

## 2017-05-26 NOTE — Progress Notes (Signed)
Subjective:    Patient ID: Cassidy Barrett, female    DOB: [birth year]   MRN: [id number]    Brief patient profile:  71 yo former smoker (04/2014) referred by [facility] after hospitalization for pleural effusion, CAP and COPD exacerbation . Patient has alcoholic cirrhosis, ascites, hyponatremia   06/25/2014  [facility] follow up / NP eval  Patient was admitted October 19 through October 30 for COPD exacerbation, acute hypoxic respiratory failure with community-acquired pneumonia and a right pleural effusion She was treated with IV antibiotics. CT chest showed a moderate layering right pleural effusion with compression of the right lung. CT abdomen showed possible liver cirrhosis with ascites and a 3-4 cm soft tissue mass in the rectum. Labs were remarkable for elevated LFTs and low sodium She underwent a paracentesis with 140 cc removed. She was seen by nephrology, and was treated with hypertonic IV fluids and salt tablets with improvement of her sodium Sodium returned to normal prior to discharge. Patient was seen by gastroenterology and underwent colonoscopy with polyp removal , path showed a tubovillous adenoma.  No formal dx of COPD in past. Has quit smoking since discharge. Denies any alcohol intake since discharge Since discharge, she is feeling improved. She has decreased shortness of breath or cough. Chest x-ray today is improved with resolution of pleural effusion. She denies any chest pain, orthopnea, PND or leg swelling She has home health and physical therapy at home Feels that her strength is improving. rec Great job on not smoking -keep up good work.  Follow up Dr. Gwenette Barrett in 4-6 weeks with PFT > did not return    03/02/2017 1st [facility] office visit/ re-establish care/ Cassidy Barrett  / stopped smoking 02/24/17  Chief Complaint  Patient presents with  . Pulmonary Consult    Referred by PCP. Pt c/o SOB for the past wk. She states she gets SOB with walking short distances such as  from room to room at home. She also gets SOB lying down and wakes up feeling breathless.   best  Day =  3 weeks prior to OV  Limited by knee and back more than doe and no need for inhalers Onset of tachycardia was 4 weeks prior to OV  > rx metaprolol around 3 weeks prior to OV worse sob so > coreg 8/1-8/5   But no better and then improved off coreg x 48 h prior to OV   rx ventolin seems to help only used 20 doses since this flare  Doe = MMRC4  = sob if tries to leave home or while getting dressed   Also nightly sob unless sleeps upright x 3 weeks  rec Plan A = Automatic = Spiriva 2.5mg   X 2 pffs each am Work on inhaler technique: Plan B = Backup Only use your albuterol (ventolin) as a rescue medication  Strongly prefer in this setting if need a beta blocker:  bisoprolol the most selective generic choice  on the market.      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 Follow-up    patient is doing better  [facility] = can't walk a nl pace on a flat grade s sob but does fine slow and flat eg shopping leaning on rolator  Using albuterol up to twice weekly   No obvious day to day or daytime variability or assoc excess/ purulent sputum or mucus plugs or hemoptysis  or cp or chest tightness, subjective wheeze or overt sinus or hb symptoms. No unusual exp hx or h/o childhood pna/ asthma or knowledge of premature birth.  Sleeping ok flat without nocturnal  or early am exacerbation  of respiratory  c/o's or need for noct saba. Also denies any obvious fluctuation of symptoms with weather or environmental changes or other aggravating or alleviating factors except as outlined above   Current Allergies, Complete Past Medical History, Past Surgical History, Family History, and Social History were reviewed in [employer] record.  ROS  The following are not active complaints unless  bolded Hoarseness, sore throat, dysphagia, dental problems, itching, sneezing,  nasal congestion or discharge of excess mucus or purulent secretions, ear ache,   fever, chills, sweats, unintended wt loss or wt gain, classically pleuritic or exertional cp,  orthopnea pnd or leg swelling, presyncope, palpitations, abdominal pain, anorexia, nausea, vomiting, diarrhea  or change in bowel habits or change in bladder habits, change in stools or change in urine, dysuria, hematuria,  rash, arthralgias, visual complaints, headache, numbness, weakness or ataxia or problems with walking or coordination= using rolator,  change in mood/affect or memory.        Current Meds  Medication Sig  . albuterol (PROVENTIL HFA;VENTOLIN HFA) 108 (90 Base) MCG/ACT inhaler Inhale 2 puffs into the lungs every 6 (six) hours as needed for wheezing or shortness of breath.  Marland Kitchen amlodipine-atorvastatin (CADUET) 2.5-10 MG tablet Take 1 tablet by mouth daily.  . baclofen (LIORESAL) 10 MG tablet Take 5 mg by mouth 3 (three) times daily.  . Calcium Citrate-Vitamin D (CALCIUM + D PO) Take 1 tablet by mouth daily.   . cyclobenzaprine (FLEXERIL) 5 MG tablet Take 5 mg by mouth 3 (three) times daily as needed for muscle spasms.  Marland Kitchen ibuprofen (ADVIL,MOTRIN) 800 MG tablet Take 800 mg by mouth every 8 (eight) hours as needed.  . Multiple Vitamins-Minerals (MULTIVITAMIN WITH MINERALS) tablet Take 1 tablet by mouth daily.  Marland Kitchen umeclidinium bromide (INCRUSE ELLIPTA) 62.5 MCG/INH AEPB Inhale 1 puff into the lungs daily.  . [DISCONTINUED] umeclidinium bromide (INCRUSE ELLIPTA) 62.5 MCG/INH AEPB Inhale 1 puff into the lungs daily.                                      Objective:   Physical Exam    W/c bound elderly wf nad >> stated age    05/26/2017   03/02/17 80 lb (36.3 kg)  01/21/17 81 lb 9.6 oz (37 kg)  01/01/17 80 lb 12.8 oz (36.7 kg)    Vital signs reviewed  - Note on arrival 02 sats  93% on RA     HEENT: nl    turbinates bilaterally, and oropharynx. Nl external ear canals without cough reflex - poor dentition    NECK :  without JVD/Nodes/TM/ nl carotid upstrokes bilaterally   LUNGS: no acc muscle use,  Barrel contour chest/kyphotic with distant bs without wheeze   CV:  RRR  no s3 or murmur or increase in P2, and no edema   ABD:  soft and nontender with nl inspiratory excursion in the supine position. No bruits or organomegaly appreciated, bowel sounds nl  MS:   ext warm without deformities, calf tenderness, cyanosis or clubbing No obvious joint restrictions   SKIN: warm and dry without lesions    NEURO:  alert, approp, nl sensorium with  no motor or  cerebellar deficits apparent.    CXR PA and Lateral:   03/02/2017 :    I personally reviewed images and   impression as follows:   Severe copd/ kyphosis       Assessment & Plan:

## 2017-05-27 ENCOUNTER — Encounter: Payer: Self-pay | Admitting: Internal Medicine

## 2017-05-27 NOTE — Assessment & Plan Note (Signed)
Quit smoking 02/24/17 -  Spirometry 03/02/2017  FEV1 0.27 (14%)  Ratio 40 with severe curvature 15 min p saba - 03/02/2017  After extensive coaching device  effectiveness =   75% (short Ti) with smi > try spiriva 2.5   > insurance did not cover so changed to incruse  - PFT's  05/26/2017  FEV1 0.73 (39 % ) ratio 40   p no % improvement from saba p incruse 18h  prior to study with DLCO  79/70c % corrects to 109  % for alv volume   - 05/26/2017  After extensive coaching device effectiveness = 100% with DPI /elipta > try anoro one click each am       Pt is Group B in terms of symptom/risk and laba/lama therefore appropriate rx at this point > try anoro one each am   I had an extended discussion with the patient reviewing all relevant studies completed to date and  lasting 15 to 20 minutes of a 25 minute visit    Formulary restrictions will be an ongoing challenge for the forseable future and I would be happy to pick an alternative if the pt will first  provide me a list of them but pt  will need to return here for training for any new device that is required eg dpi vs hfa vs respimat.    In meantime we can always provide samples so the patient never runs out of any needed respiratory medications.   Each maintenance medication was reviewed in detail including most importantly the difference between maintenance and prns and under what circumstances the prns are to be triggered using an action plan format that is not reflected in the computer generated alphabetically organized AVS.    Please see AVS for specific instructions unique to this visit that I personally wrote and verbalized to the the pt in detail and then reviewed with pt  by my nurse highlighting any  changes in therapy recommended at today's visit to their plan of care.

## 2017-05-28 ENCOUNTER — Other Ambulatory Visit: Payer: Self-pay | Admitting: Internal Medicine

## 2017-05-28 ENCOUNTER — Ambulatory Visit: Payer: Self-pay | Admitting: Hematology

## 2017-05-28 ENCOUNTER — Other Ambulatory Visit: Payer: Self-pay

## 2017-05-28 DIAGNOSIS — Z87891 Personal history of nicotine dependence: Secondary | ICD-10-CM

## 2017-05-28 DIAGNOSIS — Z1231 Encounter for screening mammogram for malignant neoplasm of breast: Secondary | ICD-10-CM

## 2017-05-31 ENCOUNTER — Ambulatory Visit (HOSPITAL_BASED_OUTPATIENT_CLINIC_OR_DEPARTMENT_OTHER): Payer: Medicare Other | Admitting: Hematology

## 2017-05-31 ENCOUNTER — Other Ambulatory Visit (HOSPITAL_BASED_OUTPATIENT_CLINIC_OR_DEPARTMENT_OTHER): Payer: Medicare Other

## 2017-05-31 ENCOUNTER — Telehealth: Payer: Self-pay | Admitting: Internal Medicine

## 2017-05-31 ENCOUNTER — Encounter: Payer: Self-pay | Admitting: Hematology

## 2017-05-31 VITALS — BP 135/83 | HR 101 | Temp 99.0°F | Resp 17 | Ht 59.0 in | Wt 81.7 lb

## 2017-05-31 DIAGNOSIS — C9000 Multiple myeloma not having achieved remission: Secondary | ICD-10-CM

## 2017-05-31 DIAGNOSIS — K7460 Unspecified cirrhosis of liver: Secondary | ICD-10-CM | POA: Diagnosis not present

## 2017-05-31 DIAGNOSIS — D472 Monoclonal gammopathy: Secondary | ICD-10-CM

## 2017-05-31 LAB — CBC & DIFF AND RETIC
BASO%: 0.3 % (ref 0.0–2.0)
Basophils Absolute: 0 10*3/uL (ref 0.0–0.1)
EOS%: 2.4 % (ref 0.0–7.0)
Eosinophils Absolute: 0.2 10*3/uL (ref 0.0–0.5)
HEMATOCRIT: 41.5 % (ref 34.8–46.6)
HEMOGLOBIN: 14.5 g/dL (ref 11.6–15.9)
Immature Retic Fract: 0.9 % — ABNORMAL LOW (ref 1.60–10.00)
LYMPH#: 1.2 10*3/uL (ref 0.9–3.3)
LYMPH%: 16.2 % (ref 14.0–49.7)
MCH: 36.2 pg — AB (ref 25.1–34.0)
MCHC: 34.9 g/dL (ref 31.5–36.0)
MCV: 103.5 fL — AB (ref 79.5–101.0)
MONO#: 0.7 10*3/uL (ref 0.1–0.9)
MONO%: 9.4 % (ref 0.0–14.0)
NEUT%: 71.7 % (ref 38.4–76.8)
NEUTROS ABS: 5.4 10*3/uL (ref 1.5–6.5)
Platelets: 180 10*3/uL (ref 145–400)
RBC: 4.01 10*6/uL (ref 3.70–5.45)
RDW: 13.8 % (ref 11.2–14.5)
Retic %: 0.97 % (ref 0.70–2.10)
Retic Ct Abs: 38.9 10*3/uL (ref 33.70–90.70)
WBC: 7.5 10*3/uL (ref 3.9–10.3)

## 2017-05-31 LAB — COMPREHENSIVE METABOLIC PANEL
ALBUMIN: 3.7 g/dL (ref 3.5–5.0)
ALK PHOS: 234 U/L — AB (ref 40–150)
ALT: 21 U/L (ref 0–55)
ANION GAP: 11 meq/L (ref 3–11)
AST: 38 U/L — ABNORMAL HIGH (ref 5–34)
BUN: 7.9 mg/dL (ref 7.0–26.0)
CO2: 26 mEq/L (ref 22–29)
Calcium: 9.4 mg/dL (ref 8.4–10.4)
Chloride: 98 mEq/L (ref 98–109)
Creatinine: 0.6 mg/dL (ref 0.6–1.1)
GLUCOSE: 108 mg/dL (ref 70–140)
POTASSIUM: 3.4 meq/L — AB (ref 3.5–5.1)
SODIUM: 135 meq/L — AB (ref 136–145)
Total Bilirubin: 0.68 mg/dL (ref 0.20–1.20)
Total Protein: 8.7 g/dL — ABNORMAL HIGH (ref 6.4–8.3)

## 2017-05-31 MED ORDER — UMECLIDINIUM-VILANTEROL 62.5-25 MCG/INH IN AEPB: 1.0000 | INHALATION_SPRAY | Freq: Every day | RESPIRATORY_TRACT | 6 refills | Status: DC

## 2017-05-31 NOTE — Telephone Encounter (Signed)
Rx for Anoro has been sent to preferred pharmacy.  Pt is aware and voiced her understanding. Nothing further needed.  

## 2017-05-31 NOTE — Telephone Encounter (Signed)
Spoke with patient. She stated that she was seen on 10/31 and was told that Anoro would be sent into her pharmacy. The pharmacy has not received this RX. Advised patient that on her AVS, it states for her to have either Anoro on Incruse.   She prefers Anoro.   MW, please advise if you want her on Anoro or Incruse. Thanks!

## 2017-05-31 NOTE — Telephone Encounter (Signed)
Left message for patient to call back  

## 2017-05-31 NOTE — Progress Notes (Signed)
Marland Kitchen    HEMATOLOGY/ONCOLOGY CLINIC NOTE  Date of Service: .Marland Kitchen11/11/2016   Patient Care Team: Lanice Shirts, MD as PCP - General (Internal Medicine) Jeralene Peters, DDS as Dietitian (Periodontics)  Orthopedics - Dr. Layne Benton from [facility]   CHIEF COMPLAINTS/PURPOSE OF CONSULTATION:  F/u for Smoldering Multiple myeloma.  HISTORY OF PRESENTING ILLNESS:   Cassidy Barrett is a wonderful 71 y.o. female who has been referred to [place] by Dr Wandra Feinstein for evaluation and management of possible plasma cell dyscrasia.  Patient has a history of liver cirrhosis with ascites, alcohol abuse, emphysema, macrocytic anemia, osteoporosis and was seen by orthopedics for spinal vertebral compression fractures in the setting of osteoporosis.  She was noted to have multiple significant lab abnormalities including an elevated alkaline phosphatase and high total protein level.  Chronic appealing L1-L2 compression fractures  Patient subsequently had a bone scan to evaluate these compression fractures on 09/26/2015.  This showed mild increased activity within the superior endplates of L3, L4 and L5 likely representing subacute fractures. Increased activity within the T2 vertebral body compatible with known compression fracture. No evidence of suggest bony metastatic disease.  As a part of her workup she also had an SPEP done which shows 3 monoclonal protein bands for a total M protein of 2.3 g/dL. On IFE this is noted to be monoclonal IgA kappa protein. Her sedimentation rate was also elevated at 73.  She was referred to [place] for further evaluation to rule out multiple myeloma. Patient notes her mid and lower back pain related to her compression fractures. Also notes some left hip pain. Her labs from January 2017 did not show any anemia, hypercalcemia or renal failure.  She notes that she did have a colonoscopy in 2015 which demonstrated a 4.5 cm rectal polyp which was removed  piecemeal. Pathology showed a tubulovillous adenoma. Patient was also noted to have significant diverticulosis and was not recommended routine colonoscopies. Given her CT scan showing concern of rectal mass she was referred back to Dr. Henrene Pastor at [place] for consideration of a sigmoidoscopy/colonoscopy. Patient notes no overt rectal bleeding. No obstructive symptoms. No change in bowel habits. No new abdominal pain. She also needs to connect with gi for management of her liver cirrhosis and related issues.   INTERVAL HISTORY  Patient is here for her scheduled followup for smoldering myeloma. She states she is doing well overall. She reports she had bronchitis a couple of weeks ago which has now resolved. Her appetite has improved. She states that she has quit smoking and cut down her alcohol intake since her last visit. She is using a walker for aid of walking. Continues to be on calcitonin per Dr Layne Benton for her osteoporosis.  On review of systems, pt reports back pains, and denies fever, chills, mouth sores, loss of appetite, and any other accompanying symptoms.    MEDICAL HISTORY:  Past Medical History:  Diagnosis Date  . Alcohol abuse   . Arthritis   . Ascites   . Atherosclerosis of aorta ([place])   . Cholelithiasis   . Cirrhosis ([place])   . Complication of anesthesia    Difficult time waking up after having biospy with colonoscopy  . Compression fracture of lumbar vertebra ([facility])     L1 and L2  . Emphysema of lung ([place])   . History of multiple myeloma    smoldering  . Hyponatremia   . Macrocytic anemia   . 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  Osteoporosis   . Pneumonia 04/2014  . Tobacco abuse   . Tubular adenoma   . Vertebral compression fracture ([facility])    ?? 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  SURGICAL HISTORY: Past Surgical History:  Procedure Laterality Date  . MANDIBLE FRACTURE SURGERY     X 2  . TONSILLECTOMY      SOCIAL  HISTORY: Social History   Socioeconomic History  . Marital status: Married    Spouse name: Not on file  . Number of children: Not on file  . Years of education: Not on file  . Highest education level: Not on file  Social Needs  . Financial resource strain: Not on file  . Food insecurity - worry: Not on file  . Food insecurity - inability: Not on file  . Transportation needs - medical: Not on file  . Transportation needs - non-medical: Not on file  Occupational History  . Occupation: retired -[employer]  Tobacco Use  . Smoking status: Former Smoker    Packs/day: 0.25    Years: 46.00    Pack years: 11.50    Types: Cigarettes    Last attempt to quit: 02/24/2017    Years since quitting: 0.2  . Smokeless tobacco: Never Used  Substance and Sexual Activity  . Alcohol use: Yes    Alcohol/week: 0.0 oz    Comment: occasional  . Drug use: No  . Sexual activity: Not on file  Other Topics Concern  . Not on file  Social History Narrative  . Not on file   Patient notes that she is drinking about 1 beer and 1 hard liquor every day - was counseled regarding absolute alcohol abstinence.  FAMILY HISTORY:  No family history of colon cancer.  ALLERGIES:  is allergic to hydrocodone-acetaminophen; oxycodone; tape; and valium [diazepam].  MEDICATIONS:  Current Outpatient Medications  Medication Sig Dispense Refill  . albuterol (PROVENTIL HFA;VENTOLIN HFA) 108 (90 Base) MCG/ACT inhaler Inhale 2 puffs into the lungs every 6 (six) hours as needed for wheezing or shortness of breath. 1 Inhaler 2  . amlodipine-atorvastatin (CADUET) 2.5-10 MG tablet Take 1 tablet by mouth daily.    . baclofen (LIORESAL) 10 MG tablet Take 5 mg by mouth 3 (three) times daily.    . Calcium Citrate-Vitamin D (CALCIUM + D PO) Take 1 tablet by mouth daily.     . cyclobenzaprine (FLEXERIL) 5 MG tablet Take 5 mg by mouth 3 (three) times daily as needed for muscle spasms.    Marland Kitchen ibuprofen (ADVIL,MOTRIN) 800 MG tablet  Take 800 mg by mouth every 8 (eight) hours as needed.    . Multiple Vitamins-Minerals (MULTIVITAMIN WITH MINERALS) tablet Take 1 tablet by mouth daily.    Marland Kitchen umeclidinium bromide (INCRUSE ELLIPTA) 62.5 MCG/INH AEPB Inhale 1 puff into the lungs daily. 90 each 3   No current facility-administered medications for this visit.     REVIEW OF SYSTEMS:    10 Point review of Systems was done is negative except as noted above.  PHYSICAL EXAMINATION: ECOG PERFORMANCE STATUS: 2 - Symptomatic, <50% confined to bed  . Vitals:   05/31/17 1343  BP: 135/83  Pulse: (!) 101  Resp: 17  Temp: 99 F (37.2 C)  SpO2: 97%   Filed Weights   05/31/17 1343  Weight: 81 lb 11.2 oz (37.1 kg)   .Body mass index is 16.5 kg/m.  GENERAL:alert, in no acute distress and comfortable SKIN:No acute rashes  EYES: normal, conjunctiva are pink and  non-injected, sclera clear OROPHARYNX:no exudate, no erythema and lips, buccal mucosa, and tongue normal  NECK: supple, no JVD, thyroid normal size, non-tender, without nodularity LYMPH:  no palpable lymphadenopathy in the cervical, axillary or inguinal LUNGS: clear to auscultation with normal respiratory effort HEART: regular rate & rhythm,  no murmurs and no lower extremity edema ABDOMEN: abdomen soft, non-tender, normoactive bowel sounds , no palpable hepatosplenomegaly Musculoskeletal: No pedal edema PSYCH: alert & oriented x 3 with fluent speech NEURO: no focal motor/sensory deficits  LABORATORY DATA:  I have reviewed the data as listed  . CBC Latest Ref Rng & Units 05/31/2017 01/21/2017 09/24/2016  WBC 3.9 - 10.3 10e3/uL 7.5 8.7 7.1  Hemoglobin 11.6 - 15.9 g/dL 14.5 12.8 14.0  Hematocrit 34.8 - 46.6 % 41.5 37.3 40.5  Platelets 145 - 400 10e3/uL 180 295 280    . CMP Latest Ref Rng & Units 05/31/2017 05/31/2017 01/21/2017  Glucose 70 - 140 mg/dl 108 - 109  BUN 7.0 - 26.0 mg/dL 7.9 - 5.5(L)  Creatinine 0.6 - 1.1 mg/dL 0.6 - 0.7  Sodium 136 - 145 mEq/L 135(L) -  137  Potassium 3.5 - 5.1 mEq/L 3.4(L) - 4.1  Chloride 101 - 111 mmol/L - - -  CO2 22 - 29 mEq/L 26 - 29  Calcium 8.4 - 10.4 mg/dL 9.4 - 9.7  Total Protein 6.4 - 8.3 g/dL 8.7(H) 8.3 8.9(H)  Total Bilirubin 0.20 - 1.20 mg/dL 0.68 - 0.76  Alkaline Phos 40 - 150 U/L 234(H) - 430(H)  AST 5 - 34 U/L 38(H) - 53(H)  ALT 0 - 55 U/L 21 - 18           RADIOGRAPHIC STUDIES: I have personally reviewed the radiological images as listed and agreed with the findings in the report. No results found.   ASSESSMENT & PLAN:   71 year old Caucasian female with severe osteoporosis and vertebral compression fractures  #1 IgA kappa Smoldering Multiple myeloma    M protein level stable today ( 1.5-->1.8 ---> 1.9-->1.8) Labs with no signs of anemia, hypercalcemia or renal failure. No overt focal new bone pains. Bm Bx shows 21% abnormal plasma cells. Rpt Bone Survey on 05/12/2016 -- showed no evidence of lytic lesions. Plan -no clear indication of progression to multiple myeloma at this time. -SPEP and K/L SFLC levels were reviewed and was stable/better. -will hold off on definitive treatments for multiple myeloma unless there is any overt evidence of end organ injury meeting criteria for multiple myeloma. -Return to care with Dr. Irene Limbo in 4 months with repeat CBC, CMP and myeloma panel.  #2 liver cirrhosis with known varices Plan -Counseled on absolute alcohol cessation. -continue f/u with PCP  #3 osteoporosis with vertebral compression fractures.  #4 ?? history of osteonecrosis of the jaw with Fosamax/bisphosphonates previously . Unclear if this truly represents ONJ ---patient decribes it as "having brittle teeth" Plan  -Patient has been following with orthopedics Dr. Wandra Feinstein to manage her osteoporosis . -continue aggressive vitamin D replacement . -she is now following with a dentist to address her dental issues prior to use of Bisphosphonates for osteoporosis -she is currently on  calcitonin per Dr Layne Benton (ALK PO4 levels are at 234 as of today 05/31/2017   RTC with Dr Irene Limbo in 4 months with rpt labs.  All of the patients and her female partners questions were answered to their apparent satisfaction. The patient knows to call the clinic with any problems, questions or concerns.  I spent 20 minutes counseling  the patient face to face. The total time spent in the appointment was 25 minutes and more than 50% was on counseling and direct patient cares.    Sullivan Lone MD [place] AAHIVMS [facility] Hematology/Oncology Physician [place]  (Office):       [phone] (Work cell):  [phone] (Fax):           [phone]  This document serves as a record of services personally performed by Sullivan Lone, MD. It was created on his behalf by Alean Rinne, a trained medical scribe. The creation of this record is based on the scribe's personal observations and the provider's statements to them.    .I have reviewed the above documentation for accuracy and completeness, and I agree with the above.  Brunetta Genera MD

## 2017-05-31 NOTE — Telephone Encounter (Signed)
Patient returned call [phone]

## 2017-05-31 NOTE — Telephone Encounter (Signed)
Fine with me to use anoro one click each am but she take incruse off her list and ours

## 2017-06-01 ENCOUNTER — Ambulatory Visit
Admission: RE | Admit: 2017-06-01 | Discharge: 2017-06-01 | Disposition: A | Discharge status: Home / Self Care | Payer: Medicare Other | Source: Ambulatory Visit | Attending: Internal Medicine | Admitting: Internal Medicine

## 2017-06-01 DIAGNOSIS — Z1231 Encounter for screening mammogram for malignant neoplasm of breast: Secondary | ICD-10-CM | POA: Diagnosis not present

## 2017-06-01 DIAGNOSIS — Z87891 Personal history of nicotine dependence: Secondary | ICD-10-CM | POA: Diagnosis not present

## 2017-06-01 LAB — KAPPA/LAMBDA LIGHT CHAINS
IG LAMBDA FREE LIGHT CHAIN: 13.2 mg/L (ref 5.7–26.3)
Ig Kappa Free Light Chain: 44.5 mg/L — ABNORMAL HIGH (ref 3.3–19.4)
KAPPA/LAMBDA FLC RATIO: 3.37 — AB (ref 0.26–1.65)

## 2017-06-02 LAB — MULTIPLE MYELOMA PANEL, SERUM
ALBUMIN SERPL ELPH-MCNC: 3.7 g/dL (ref 2.9–4.4)
ALBUMIN/GLOB SERPL: 0.9 (ref 0.7–1.7)
ALPHA 1: 0.3 g/dL (ref 0.0–0.4)
ALPHA2 GLOB SERPL ELPH-MCNC: 0.7 g/dL (ref 0.4–1.0)
B-Globulin SerPl Elph-Mcnc: 2.6 g/dL — ABNORMAL HIGH (ref 0.7–1.3)
GAMMA GLOB SERPL ELPH-MCNC: 0.9 g/dL (ref 0.4–1.8)
GLOBULIN, TOTAL: 4.6 g/dL — AB (ref 2.2–3.9)
IGG (IMMUNOGLOBIN G), SERUM: 785 mg/dL (ref 700–1600)
IGM (IMMUNOGLOBIN M), SRM: 151 mg/dL (ref 26–217)
IgA, Qn, Serum: 2528 mg/dL — ABNORMAL HIGH (ref 87–352)
M Protein SerPl Elph-Mcnc: 1.8 g/dL — ABNORMAL HIGH
Total Protein: 8.3 g/dL (ref 6.0–8.5)

## 2017-06-04 ENCOUNTER — Telehealth: Payer: Self-pay | Admitting: Hematology

## 2017-06-04 NOTE — Telephone Encounter (Signed)
Spoke to patient regarding upcoming appointments per 11/5 los.

## 2017-06-14 DIAGNOSIS — Z1211 Encounter for screening for malignant neoplasm of colon: Secondary | ICD-10-CM | POA: Diagnosis not present

## 2017-06-30 ENCOUNTER — Other Ambulatory Visit: Payer: Self-pay

## 2017-06-30 ENCOUNTER — Ambulatory Visit: Payer: Self-pay | Admitting: Hematology

## 2017-07-02 DIAGNOSIS — M25562 Pain in left knee: Secondary | ICD-10-CM | POA: Diagnosis not present

## 2017-07-02 DIAGNOSIS — M545 Low back pain: Secondary | ICD-10-CM | POA: Diagnosis not present

## 2017-07-02 DIAGNOSIS — M25561 Pain in right knee: Secondary | ICD-10-CM | POA: Diagnosis not present

## 2017-07-02 DIAGNOSIS — M810 Age-related osteoporosis without current pathological fracture: Secondary | ICD-10-CM | POA: Diagnosis not present

## 2017-07-30 ENCOUNTER — Telehealth: Payer: Self-pay | Admitting: Internal Medicine

## 2017-07-30 ENCOUNTER — Encounter: Payer: Self-pay | Admitting: Adult Health

## 2017-07-30 ENCOUNTER — Ambulatory Visit (INDEPENDENT_AMBULATORY_CARE_PROVIDER_SITE_OTHER)
Admission: RE | Admit: 2017-07-30 | Discharge: 2017-07-30 | Disposition: A | Discharge status: Home / Self Care | Payer: Medicare Other | Source: Ambulatory Visit | Attending: Adult Health | Admitting: Adult Health

## 2017-07-30 ENCOUNTER — Ambulatory Visit (INDEPENDENT_AMBULATORY_CARE_PROVIDER_SITE_OTHER): Payer: Medicare Other | Admitting: Adult Health

## 2017-07-30 VITALS — BP 106/68 | HR 119 | Ht 59.0 in | Wt 85.4 lb

## 2017-07-30 DIAGNOSIS — R509 Fever, unspecified: Secondary | ICD-10-CM | POA: Diagnosis not present

## 2017-07-30 DIAGNOSIS — R0902 Hypoxemia: Secondary | ICD-10-CM | POA: Diagnosis not present

## 2017-07-30 DIAGNOSIS — R000 Tachycardia, unspecified: Secondary | ICD-10-CM | POA: Diagnosis not present

## 2017-07-30 DIAGNOSIS — J449 Chronic obstructive pulmonary disease, unspecified: Secondary | ICD-10-CM | POA: Diagnosis not present

## 2017-07-30 DIAGNOSIS — R05 Cough: Secondary | ICD-10-CM | POA: Diagnosis not present

## 2017-07-30 LAB — POCT INFLUENZA A/B
INFLUENZA A, POC: NEGATIVE
INFLUENZA B, POC: NEGATIVE

## 2017-07-30 MED ORDER — PREDNISONE 10 MG PO TABS: ORAL_TABLET | ORAL | 0 refills | Status: DC

## 2017-07-30 MED ORDER — AZITHROMYCIN 250 MG PO TABS: ORAL_TABLET | ORAL | 0 refills | Status: AC

## 2017-07-30 NOTE — Patient Instructions (Signed)
Zpack take as directed .  Mucinex DM Twice daily  As needed  Cough/congestion  Prednisone taper over next week .  Fluids and rest  follow up Dr. Melvyn Novas  As planned and As needed   Please contact office for sooner follow up if symptoms do not improve or worsen or seek emergency care

## 2017-07-30 NOTE — Assessment & Plan Note (Addendum)
Flare with bronchitis  CXR w/ nad  sats are okay on room air   Plan  Patient Instructions  Zpack take as directed .  Mucinex DM Twice daily  As needed  Cough/congestion  Prednisone taper over next week .  Fluids and rest  follow up Dr. Melvyn Novas  As planned and As needed   Please contact office for sooner follow up if symptoms do not improve or worsen or seek emergency care

## 2017-07-30 NOTE — Telephone Encounter (Signed)
Spoke with [name]. She stated that the patient was seen by another provider at another faculty and was advised to call MW to get an appointment.   She stated that the patient has been having episodes of wheezing and coughing for the past 4-5 days. O2 dropped down to 82% today during visit.   Patient has been scheduled to see TP at 3pm. Advised her that if the patient gets any worse to go to the nearest ED.

## 2017-07-30 NOTE — Progress Notes (Signed)
$'@Patient'G$  ID: Cassidy Barrett, female    DOB: 13-Mar-1946, 72 y.o.   MRN: [id number]  Chief Complaint  Patient presents with  . Acute Visit    cough     Referring provider: Lanice Shirts, *  HPI: 72 yo former smoker (04/2014) referred by [facility] after hospitalization for pleural effusion, CAP and COPD exacerbation . Patient has alcoholic cirrhosis with varices, ascites, hyponatremia  Followed by oncology for smoldering myeloma   TEST  Spirometry 03/02/2017  FEV1 0.27 (14%)  Ratio 40 with severe curvature 15 min p saba - 03/02/2017  After extensive coaching device  effectiveness =   75% (short Ti) with smi > try spiriva 2.5   > insurance did not cover so changed to incruse  - PFT's  05/26/2017  FEV1 0.73 (39 % ) ratio 40   p no % improvement from saba    07/30/2017 Acute OV  Patient presents for an acute office visit.  Patient complains over the last 3-4 days she has had increased cough congestion wheezing.  She has noticed that her oxygen levels have been low.  I Patient says she has been eating okay.  No nausea vomiting diarrhea.  Patient denies any hemoptysis fever chest pain orthopnea PND or leg swelling.  Temperature was 99 on arrival to the office today.  Flu swab is neg  Chest x-ray today shows chronic changes w/ nad.  O2 saturation 91% at rest , walking O2 sat 91-95% on room air .   Had dental work 1-2 weeks ago was on abx. Eating soft diet .    Allergies  Allergen Reactions  . Hydrocodone-Acetaminophen   . Oxycodone Other (See Comments)    Anxiety, restless  . Tape     Skin sensitive to tape  . Valium [Diazepam] Other (See Comments)    Pt states makes her crazy    Immunization History  Administered Date(s) Administered  . Influenza Split 06/25/2017  . Influenza,inj,Quad PF,6+ Mos 06/25/2014  . Pneumococcal Conjugate-13 06/25/2017    Past Medical History:  Diagnosis Date  . Alcohol abuse   . Arthritis   . Ascites   . Atherosclerosis of aorta ([place])   .  Cholelithiasis   . Cirrhosis ([place])   . Complication of anesthesia    Difficult time waking up after having biospy with colonoscopy  . Compression fracture of lumbar vertebra (HCC)     L1 and L2  . Emphysema of lung ([place])   . History of multiple myeloma    smoldering  . Hyponatremia   . Macrocytic anemia   . 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 Osteoporosis   . Pneumonia 04/2014  . Tobacco abuse   . Tubular adenoma   . Vertebral compression fracture (HCC)     Tobacco History: Social History   Tobacco Use  Smoking Status Former Smoker  . Packs/day: 0.25  . Years: 46.00  . Pack years: 11.50  . Types: Cigarettes  . Last attempt to quit: 02/24/2017  . Years since quitting: 0.4  Smokeless Tobacco Never Used   Counseling given: Not Answered   Outpatient Encounter Medications as of 07/30/2017  Medication Sig  . albuterol (PROVENTIL HFA;VENTOLIN HFA) 108 (90 Base) MCG/ACT inhaler Inhale 2 puffs into the lungs every 6 (six) hours as needed for wheezing or shortness of breath.  Marland Kitchen amlodipine-atorvastatin (CADUET) 2.5-10 MG tablet Take 1 tablet by mouth daily.  . Calcium Citrate-Vitamin D (CALCIUM + D PO) Take 1 tablet by mouth daily.   Marland Kitchen  cyclobenzaprine (FLEXERIL) 5 MG tablet Take 5 mg by mouth 3 (three) times daily as needed for muscle spasms.  Marland Kitchen ibuprofen (ADVIL,MOTRIN) 800 MG tablet Take 800 mg by mouth every 8 (eight) hours as needed.  . Multiple Vitamins-Minerals (MULTIVITAMIN WITH MINERALS) tablet Take 1 tablet by mouth daily.  Marland Kitchen umeclidinium-vilanterol (ANORO ELLIPTA) 62.5-25 MCG/INH AEPB Inhale 1 puff daily into the lungs.  . [DISCONTINUED] baclofen (LIORESAL) 10 MG tablet Take 5 mg by mouth 3 (three) times daily.  Marland Kitchen azithromycin (ZITHROMAX Z-PAK) 250 MG tablet Take 2 tablets (500 mg) on  Day 1,  followed by 1 tablet (250 mg) once daily on Days 2 through 5.  . predniSONE (DELTASONE) 10 MG tablet 4 tabs for 2 days, then 3 tabs for 2 days, 2 tabs for 2 days, then 1 tab for 2 days, then  stop   No facility-administered encounter medications on file as of 07/30/2017.      Review of Systems  Constitutional:   No  weight loss, night sweats,  Fevers, chills,  +fatigue, or  lassitude.  HEENT:   No headaches,  Difficulty swallowing,  Tooth/dental problems, or  Sore throat,                No sneezing, itching, ear ache, nasal congestion, post nasal drip,   CV:  No chest pain,  Orthopnea, PND, swelling in lower extremities, anasarca, dizziness, palpitations, syncope.   GI  No heartburn, indigestion, abdominal pain, nausea, vomiting, diarrhea, change in bowel habits, loss of appetite, bloody stools.   Resp:    No chest wall deformity  Skin: no rash or lesions.  GU: no dysuria, change in color of urine, no urgency or frequency.  No flank pain, no hematuria   MS:  No joint pain or swelling.  No decreased range of motion.  No back pain.    Physical Exam  BP 106/68 (BP Location: Left Arm, Cuff Size: Normal)   Pulse (!) 119   Ht '4\' 11"'$  (1.499 m)   Wt 85 lb 6.4 oz (38.7 kg)   SpO2 90%   BMI 17.25 kg/m   GEN: A/Ox3; pleasant , NAD, thin and frail , walker    HEENT:  [place]/AT,  EACs-clear, TMs-wnl, NOSE-clear, THROAT-clear, no lesions, no postnasal drip or exudate noted. Edentulous   NECK:  Supple w/ fair ROM; no JVD; normal carotid impulses w/o bruits; no thyromegaly or nodules palpated; no lymphadenopathy.    RESP  Decreased BS in bases ,  no accessory muscle use, no dullness to percussion  CARD:  RRR, no m/r/g, no peripheral edema, pulses intact, no cyanosis or clubbing.  GI:   Soft & nt; nml bowel sounds; no organomegaly or masses detected.   Musco: Warm bil, no deformities or joint swelling noted.   Neuro: alert, no focal deficits noted.    Skin: Warm, no lesions or rashes    Lab Results:  CBC    BNP No results found for: BNP  ProBNP   Assessment & Plan:   COPD GOLD III Flare with bronchitis  CXR w/ nad  sats are okay on room air   Plan    Patient Instructions  Zpack take as directed .  Mucinex DM Twice daily  As needed  Cough/congestion  Prednisone taper over next week .  Fluids and rest  follow up Dr. Melvyn Novas  As planned and As needed   Please contact office for sooner follow up if symptoms do not improve or worsen or seek emergency  care          Rexene Edison, NP 07/30/2017

## 2017-07-31 NOTE — Progress Notes (Signed)
Chart and office note reviewed in detail  > agree with a/p as outlined    

## 2017-08-13 ENCOUNTER — Emergency Department (HOSPITAL_COMMUNITY): Payer: Medicare Other

## 2017-08-13 ENCOUNTER — Emergency Department (HOSPITAL_COMMUNITY)
Admission: EM | Admit: 2017-08-13 | Discharge: 2017-08-13 | Disposition: A | Discharge status: Home / Self Care | Payer: Medicare Other | Attending: Emergency Medicine | Admitting: Emergency Medicine

## 2017-08-13 DIAGNOSIS — Y939 Activity, unspecified: Secondary | ICD-10-CM | POA: Diagnosis not present

## 2017-08-13 DIAGNOSIS — Y929 Unspecified place or not applicable: Secondary | ICD-10-CM | POA: Insufficient documentation

## 2017-08-13 DIAGNOSIS — G894 Chronic pain syndrome: Secondary | ICD-10-CM | POA: Diagnosis not present

## 2017-08-13 DIAGNOSIS — S24109A Unspecified injury at unspecified level of thoracic spinal cord, initial encounter: Secondary | ICD-10-CM | POA: Diagnosis present

## 2017-08-13 DIAGNOSIS — X58XXXA Exposure to other specified factors, initial encounter: Secondary | ICD-10-CM | POA: Diagnosis not present

## 2017-08-13 DIAGNOSIS — M810 Age-related osteoporosis without current pathological fracture: Secondary | ICD-10-CM | POA: Diagnosis not present

## 2017-08-13 DIAGNOSIS — Z87311 Personal history of (healed) other pathological fracture: Secondary | ICD-10-CM | POA: Insufficient documentation

## 2017-08-13 DIAGNOSIS — Y999 Unspecified external cause status: Secondary | ICD-10-CM | POA: Insufficient documentation

## 2017-08-13 DIAGNOSIS — M4854XA Collapsed vertebra, not elsewhere classified, thoracic region, initial encounter for fracture: Secondary | ICD-10-CM | POA: Diagnosis not present

## 2017-08-13 DIAGNOSIS — J439 Emphysema, unspecified: Secondary | ICD-10-CM | POA: Diagnosis not present

## 2017-08-13 DIAGNOSIS — Z79899 Other long term (current) drug therapy: Secondary | ICD-10-CM | POA: Diagnosis not present

## 2017-08-13 DIAGNOSIS — S22060A Wedge compression fracture of T7-T8 vertebra, initial encounter for closed fracture: Secondary | ICD-10-CM | POA: Diagnosis not present

## 2017-08-13 DIAGNOSIS — Z87891 Personal history of nicotine dependence: Secondary | ICD-10-CM | POA: Diagnosis not present

## 2017-08-13 DIAGNOSIS — S22000A Wedge compression fracture of unspecified thoracic vertebra, initial encounter for closed fracture: Secondary | ICD-10-CM

## 2017-08-13 DIAGNOSIS — J449 Chronic obstructive pulmonary disease, unspecified: Secondary | ICD-10-CM | POA: Diagnosis not present

## 2017-08-13 DIAGNOSIS — R0602 Shortness of breath: Secondary | ICD-10-CM | POA: Diagnosis not present

## 2017-08-13 LAB — BASIC METABOLIC PANEL
Anion gap: 9 (ref 5–15)
BUN: 7 mg/dL (ref 6–20)
CALCIUM: 9 mg/dL (ref 8.9–10.3)
CO2: 24 mmol/L (ref 22–32)
Chloride: 98 mmol/L — ABNORMAL LOW (ref 101–111)
Creatinine, Ser: 0.5 mg/dL (ref 0.44–1.00)
Glucose, Bld: 151 mg/dL — ABNORMAL HIGH (ref 65–99)
Potassium: 3.6 mmol/L (ref 3.5–5.1)
SODIUM: 131 mmol/L — AB (ref 135–145)

## 2017-08-13 LAB — CBC WITH DIFFERENTIAL/PLATELET
BASOS PCT: 0 %
Basophils Absolute: 0.1 10*3/uL (ref 0.0–0.1)
EOS PCT: 2 %
Eosinophils Absolute: 0.3 10*3/uL (ref 0.0–0.7)
HCT: 38.3 % (ref 36.0–46.0)
Hemoglobin: 13.7 g/dL (ref 12.0–15.0)
Lymphocytes Relative: 11 %
Lymphs Abs: 1.4 10*3/uL (ref 0.7–4.0)
MCH: 37.1 pg — ABNORMAL HIGH (ref 26.0–34.0)
MCHC: 35.8 g/dL (ref 30.0–36.0)
MCV: 103.8 fL — ABNORMAL HIGH (ref 78.0–100.0)
MONO ABS: 0.7 10*3/uL (ref 0.1–1.0)
Monocytes Relative: 6 %
Neutro Abs: 10.6 10*3/uL — ABNORMAL HIGH (ref 1.7–7.7)
Neutrophils Relative %: 81 %
PLATELETS: 271 10*3/uL (ref 150–400)
RBC: 3.69 MIL/uL — AB (ref 3.87–5.11)
RDW: 13 % (ref 11.5–15.5)
WBC: 13 10*3/uL — AB (ref 4.0–10.5)

## 2017-08-13 MED ORDER — MORPHINE SULFATE 15 MG PO TABS: 15.0000 mg | ORAL_TABLET | Freq: Four times a day (QID) | ORAL | 0 refills | Status: DC | PRN

## 2017-08-13 MED ORDER — KETOROLAC TROMETHAMINE 30 MG/ML IJ SOLN
15.0000 mg | Freq: Once | INTRAMUSCULAR | Status: AC
  Administered 2017-08-13: 15 mg via INTRAVENOUS
  Filled 2017-08-13: qty 1

## 2017-08-13 MED ORDER — IOPAMIDOL (ISOVUE-370) INJECTION 76%
INTRAVENOUS | Status: AC
  Filled 2017-08-13: qty 100

## 2017-08-13 MED ORDER — IOPAMIDOL (ISOVUE-370) INJECTION 76%
75.0000 mL | Freq: Once | INTRAVENOUS | Status: AC | PRN
  Administered 2017-08-13: 65 mL via INTRAVENOUS

## 2017-08-13 MED ORDER — MORPHINE SULFATE (PF) 4 MG/ML IV SOLN
4.0000 mg | Freq: Once | INTRAVENOUS | Status: AC
  Administered 2017-08-13: 4 mg via INTRAVENOUS
  Filled 2017-08-13: qty 1

## 2017-08-13 NOTE — ED Triage Notes (Signed)
Transported by [facility] from home--chronic back pain located in left shoulder. Pt also reports increased SHOB upon waking up this morning. +wheezing, SHOB, hx of COPD. 10 mg of Albuterol/.5 Atrovent and 125 mg of Solu-Medrol given by EMS. Patient took 1 Tramadol tablet prior to EMS arrival.

## 2017-08-13 NOTE — ED Provider Notes (Signed)
[facility] DEPT Provider Note   CSN: [id number] Arrival date & time: 08/13/17  1000     History   Chief Complaint Chief Complaint  Patient presents with  . Back Pain    HPI Cassidy Barrett is a 72 y.o. female.  72 year old female with history of COPD as well as chronic back pain presents with worsening midthoracic back pain times several days.  She has used Flexeril and Ultram at home without relief.  Has also had increased wheezing and nonproductive cough without fever or chills.  No anginal or CHF symptoms.  No upper or lower extremity weakness.  Pain is dull and worse with any movement and better with rest.  Does have a prior history of kyphoplasty.  Has and patient given albuterol 10 mg with 5 mg of Atrovent and 135 of Solu-Medrol and transported here.      Past Medical History:  Diagnosis Date  . Alcohol abuse   . Arthritis   . Ascites   . Atherosclerosis of aorta ([place])   . Cholelithiasis   . Cirrhosis ([place])   . Complication of anesthesia    Difficult time waking up after having biospy with colonoscopy  . Compression fracture of lumbar vertebra (HCC)     L1 and L2  . Emphysema of lung ([place])   . History of multiple myeloma    smoldering  . Hyponatremia   . Macrocytic anemia   . 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 Osteoporosis   . Pneumonia 04/2014  . Tobacco abuse   . Tubular adenoma   . Vertebral compression fracture [facility])     Patient Active Problem List   Diagnosis Date Noted  . Smoldering multiple myeloma ([place]) 10/24/2015  . Osteoporosis 10/24/2015  . Vitamin D deficiency 10/24/2015  . Plasma cell dyscrasia 09/30/2015  . Rectal mass 09/30/2015  . Compression fracture 12/12/2014  . Pleural effusion 06/25/2014  . Rectal mass 05/21/2014  . Alcoholic cirrhosis ([place]) 07/15/7587  . Rectal polyp 05/21/2014  . Protein-calorie malnutrition, severe ([place]) 05/16/2014  . Pneumonia 05/14/2014  . Hyponatremia 05/14/2014  . Edema  05/14/2014  . Tobacco abuse 05/14/2014  . Alcohol abuse 05/14/2014  . Hypokalemia 05/14/2014  . COPD GOLD III 05/14/2014    Past Surgical History:  Procedure Laterality Date  . COLONOSCOPY N/A 05/24/2014   Procedure: COLONOSCOPY;  Surgeon: Gatha Mayer, MD;  Location: [facility];  Service: Endoscopy;  Laterality: N/A;  MAC if possible ultraslim colonoscope and gastroscope needed  . FLEXIBLE SIGMOIDOSCOPY N/A 05/21/2014   Procedure: FLEXIBLE SIGMOIDOSCOPY;  Surgeon: Gatha Mayer, MD;  Location: [facility];  Service: Endoscopy;  Laterality: N/A;  . KYPHOPLASTY N/A 12/12/2014   Procedure: KYPHOPLASTY;  Surgeon: Phylliss Bob, MD;  Location: [facility];  Service: Orthopedics;  Laterality: N/A;  T10 kyphoplasty  . MANDIBLE FRACTURE SURGERY     X 2  . TONSILLECTOMY      OB History    No data available       Home Medications    Prior to Admission medications   Medication Sig Start Date End Date Taking? Authorizing Provider  albuterol (PROVENTIL HFA;VENTOLIN HFA) 108 (90 Base) MCG/ACT inhaler Inhale 2 puffs into the lungs every 6 (six) hours as needed for wheezing or shortness of breath. 03/17/17   Tanda Rockers, MD  amlodipine-atorvastatin (CADUET) 2.5-10 MG tablet Take 1 tablet by mouth daily.    [provider]  Calcium Citrate-Vitamin D (CALCIUM + D PO) Take 1 tablet by  mouth daily.     [provider]  cyclobenzaprine (FLEXERIL) 5 MG tablet Take 5 mg by mouth 3 (three) times daily as needed for muscle spasms.    [provider]  ibuprofen (ADVIL,MOTRIN) 800 MG tablet Take 800 mg by mouth every 8 (eight) hours as needed.    [provider]  Multiple Vitamins-Minerals (MULTIVITAMIN WITH MINERALS) tablet Take 1 tablet by mouth daily.    [provider]  predniSONE (DELTASONE) 10 MG tablet 4 tabs for 2 days, then 3 tabs for 2 days, 2 tabs for 2 days, then 1 tab for 2 days, then stop 07/30/17   Parrett, Tammy S, NP  umeclidinium-vilanterol  (ANORO ELLIPTA) 62.5-25 MCG/INH AEPB Inhale 1 puff daily into the lungs. 05/31/17   Tanda Rockers, MD    Family History Family History  Problem Relation Age of Onset  . Heart disease Mother   . Other Father        brain tumor  . COPD Sister   . ALS Brother   . Colon cancer Neg Hx   . Breast cancer Neg Hx     Social History Social History   Tobacco Use  . Smoking status: Former Smoker    Packs/day: 0.25    Years: 46.00    Pack years: 11.50    Types: Cigarettes    Last attempt to quit: 02/24/2017    Years since quitting: 0.4  . Smokeless tobacco: Never Used  Substance Use Topics  . Alcohol use: Yes    Alcohol/week: 0.0 oz    Comment: occasional  . Drug use: No     Allergies   Hydrocodone-acetaminophen; Oxycodone; Tape; and Valium [diazepam]   Review of Systems Review of Systems  All other systems reviewed and are negative.    Physical Exam Updated Vital Signs BP (!) 95/55 (BP Location: Right Arm)   Pulse (!) 149   Resp (!) 22   SpO2 100%   Physical Exam  Constitutional: She is oriented to person, place, and time. She appears well-developed and well-nourished.  Non-toxic appearance. No distress.  HENT:  Head: Normocephalic and atraumatic.  Eyes: Conjunctivae, EOM and lids are normal. Pupils are equal, round, and reactive to light.  Neck: Normal range of motion. Neck supple. No tracheal deviation present. No thyroid mass present.  Cardiovascular: Regular rhythm and normal heart sounds. Tachycardia present. Exam reveals no gallop.  No murmur heard. Pulmonary/Chest: Effort normal. No stridor. No respiratory distress. She has decreased breath sounds in the right lower field and the left lower field. She has wheezes in the right lower field and the left lower field. She has no rhonchi. She has no rales.  Abdominal: Soft. Normal appearance and bowel sounds are normal. She exhibits no distension. There is no tenderness. There is no rebound and no CVA tenderness.    Musculoskeletal: Normal range of motion. She exhibits no edema or tenderness.       Back:  Neurological: She is alert and oriented to person, place, and time. She has normal strength. No cranial nerve deficit or sensory deficit. GCS eye subscore is 4. GCS verbal subscore is 5. GCS motor subscore is 6.  Skin: Skin is warm and dry. No abrasion and no rash noted.  Psychiatric: She has a normal mood and affect. Her speech is normal and behavior is normal.  Nursing note and vitals reviewed.    ED Treatments / Results  Labs (all labs ordered are listed, but only abnormal results are displayed)  Labs Reviewed - No data to display  EKG  EKG Interpretation None       Radiology No results found.  Procedures Procedures (including critical care time)  Medications Ordered in ED Medications  morphine 4 MG/ML injection 4 mg (not administered)  ketorolac (TORADOL) 30 MG/ML injection 15 mg (not administered)     Initial Impression / Assessment and Plan / ED Course  I have reviewed the triage vital signs and the nursing notes.  Pertinent labs & imaging results that were available during my care of the patient were reviewed by me and considered in my medical decision making (see chart for details).     Spoke with patient and her spouse and she states that patient chronically has tachycardia.  She also received albuterol here.  I saw a detailed log that they keep at home of the patient's heart rate is seems that her baseline is 122.  Patient does have history of COPD and denies any new oxygen requirement.  Chest CT shows new T7 compression fracture.  Her pain is controlled after medications.  Patient be discharged home with prescription for MSIR as she cannot tolerate oxycodone  Final Clinical Impressions(s) / ED Diagnoses   Final diagnoses:  None    ED Discharge Orders    None       Lacretia Leigh, MD 08/13/17 1516

## 2017-08-13 NOTE — ED Notes (Signed)
Patient transported to X-ray 

## 2017-08-20 DIAGNOSIS — M545 Low back pain: Secondary | ICD-10-CM | POA: Diagnosis not present

## 2017-08-20 DIAGNOSIS — R5383 Other fatigue: Secondary | ICD-10-CM | POA: Diagnosis not present

## 2017-08-20 DIAGNOSIS — E559 Vitamin D deficiency, unspecified: Secondary | ICD-10-CM | POA: Diagnosis not present

## 2017-08-20 DIAGNOSIS — M810 Age-related osteoporosis without current pathological fracture: Secondary | ICD-10-CM | POA: Diagnosis not present

## 2017-08-23 DIAGNOSIS — Z87891 Personal history of nicotine dependence: Secondary | ICD-10-CM | POA: Diagnosis not present

## 2017-08-23 DIAGNOSIS — K7030 Alcoholic cirrhosis of liver without ascites: Secondary | ICD-10-CM | POA: Diagnosis not present

## 2017-08-23 DIAGNOSIS — J449 Chronic obstructive pulmonary disease, unspecified: Secondary | ICD-10-CM | POA: Diagnosis not present

## 2017-08-23 DIAGNOSIS — M8008XD Age-related osteoporosis with current pathological fracture, vertebra(e), subsequent encounter for fracture with routine healing: Secondary | ICD-10-CM | POA: Diagnosis not present

## 2017-08-23 DIAGNOSIS — G8929 Other chronic pain: Secondary | ICD-10-CM | POA: Diagnosis not present

## 2017-08-23 DIAGNOSIS — M6281 Muscle weakness (generalized): Secondary | ICD-10-CM | POA: Diagnosis not present

## 2017-08-24 DIAGNOSIS — K7030 Alcoholic cirrhosis of liver without ascites: Secondary | ICD-10-CM | POA: Diagnosis not present

## 2017-08-24 DIAGNOSIS — M6281 Muscle weakness (generalized): Secondary | ICD-10-CM | POA: Diagnosis not present

## 2017-08-24 DIAGNOSIS — J449 Chronic obstructive pulmonary disease, unspecified: Secondary | ICD-10-CM | POA: Diagnosis not present

## 2017-08-24 DIAGNOSIS — G8929 Other chronic pain: Secondary | ICD-10-CM | POA: Diagnosis not present

## 2017-08-24 DIAGNOSIS — M8008XD Age-related osteoporosis with current pathological fracture, vertebra(e), subsequent encounter for fracture with routine healing: Secondary | ICD-10-CM | POA: Diagnosis not present

## 2017-08-24 DIAGNOSIS — Z87891 Personal history of nicotine dependence: Secondary | ICD-10-CM | POA: Diagnosis not present

## 2017-08-27 ENCOUNTER — Ambulatory Visit: Payer: Self-pay | Admitting: Internal Medicine

## 2017-08-31 DIAGNOSIS — J449 Chronic obstructive pulmonary disease, unspecified: Secondary | ICD-10-CM | POA: Diagnosis not present

## 2017-08-31 DIAGNOSIS — M6281 Muscle weakness (generalized): Secondary | ICD-10-CM | POA: Diagnosis not present

## 2017-08-31 DIAGNOSIS — K7030 Alcoholic cirrhosis of liver without ascites: Secondary | ICD-10-CM | POA: Diagnosis not present

## 2017-08-31 DIAGNOSIS — G8929 Other chronic pain: Secondary | ICD-10-CM | POA: Diagnosis not present

## 2017-08-31 DIAGNOSIS — Z87891 Personal history of nicotine dependence: Secondary | ICD-10-CM | POA: Diagnosis not present

## 2017-08-31 DIAGNOSIS — M8008XD Age-related osteoporosis with current pathological fracture, vertebra(e), subsequent encounter for fracture with routine healing: Secondary | ICD-10-CM | POA: Diagnosis not present

## 2017-09-02 DIAGNOSIS — Z87891 Personal history of nicotine dependence: Secondary | ICD-10-CM | POA: Diagnosis not present

## 2017-09-02 DIAGNOSIS — K7030 Alcoholic cirrhosis of liver without ascites: Secondary | ICD-10-CM | POA: Diagnosis not present

## 2017-09-02 DIAGNOSIS — M8008XD Age-related osteoporosis with current pathological fracture, vertebra(e), subsequent encounter for fracture with routine healing: Secondary | ICD-10-CM | POA: Diagnosis not present

## 2017-09-02 DIAGNOSIS — M6281 Muscle weakness (generalized): Secondary | ICD-10-CM | POA: Diagnosis not present

## 2017-09-02 DIAGNOSIS — J449 Chronic obstructive pulmonary disease, unspecified: Secondary | ICD-10-CM | POA: Diagnosis not present

## 2017-09-02 DIAGNOSIS — G8929 Other chronic pain: Secondary | ICD-10-CM | POA: Diagnosis not present

## 2017-09-06 ENCOUNTER — Ambulatory Visit: Payer: Self-pay | Admitting: Internal Medicine

## 2017-09-06 DIAGNOSIS — Z87891 Personal history of nicotine dependence: Secondary | ICD-10-CM | POA: Diagnosis not present

## 2017-09-06 DIAGNOSIS — M6281 Muscle weakness (generalized): Secondary | ICD-10-CM | POA: Diagnosis not present

## 2017-09-06 DIAGNOSIS — M8008XD Age-related osteoporosis with current pathological fracture, vertebra(e), subsequent encounter for fracture with routine healing: Secondary | ICD-10-CM | POA: Diagnosis not present

## 2017-09-06 DIAGNOSIS — G8929 Other chronic pain: Secondary | ICD-10-CM | POA: Diagnosis not present

## 2017-09-06 DIAGNOSIS — K7030 Alcoholic cirrhosis of liver without ascites: Secondary | ICD-10-CM | POA: Diagnosis not present

## 2017-09-06 DIAGNOSIS — J449 Chronic obstructive pulmonary disease, unspecified: Secondary | ICD-10-CM | POA: Diagnosis not present

## 2017-09-07 DIAGNOSIS — M8008XD Age-related osteoporosis with current pathological fracture, vertebra(e), subsequent encounter for fracture with routine healing: Secondary | ICD-10-CM | POA: Diagnosis not present

## 2017-09-07 DIAGNOSIS — K7030 Alcoholic cirrhosis of liver without ascites: Secondary | ICD-10-CM | POA: Diagnosis not present

## 2017-09-07 DIAGNOSIS — G8929 Other chronic pain: Secondary | ICD-10-CM | POA: Diagnosis not present

## 2017-09-07 DIAGNOSIS — J449 Chronic obstructive pulmonary disease, unspecified: Secondary | ICD-10-CM | POA: Diagnosis not present

## 2017-09-07 DIAGNOSIS — M6281 Muscle weakness (generalized): Secondary | ICD-10-CM | POA: Diagnosis not present

## 2017-09-07 DIAGNOSIS — Z87891 Personal history of nicotine dependence: Secondary | ICD-10-CM | POA: Diagnosis not present

## 2017-09-08 DIAGNOSIS — M6281 Muscle weakness (generalized): Secondary | ICD-10-CM | POA: Diagnosis not present

## 2017-09-08 DIAGNOSIS — G8929 Other chronic pain: Secondary | ICD-10-CM | POA: Diagnosis not present

## 2017-09-08 DIAGNOSIS — K7030 Alcoholic cirrhosis of liver without ascites: Secondary | ICD-10-CM | POA: Diagnosis not present

## 2017-09-08 DIAGNOSIS — J449 Chronic obstructive pulmonary disease, unspecified: Secondary | ICD-10-CM | POA: Diagnosis not present

## 2017-09-08 DIAGNOSIS — Z87891 Personal history of nicotine dependence: Secondary | ICD-10-CM | POA: Diagnosis not present

## 2017-09-08 DIAGNOSIS — M8008XD Age-related osteoporosis with current pathological fracture, vertebra(e), subsequent encounter for fracture with routine healing: Secondary | ICD-10-CM | POA: Diagnosis not present

## 2017-09-13 DIAGNOSIS — J449 Chronic obstructive pulmonary disease, unspecified: Secondary | ICD-10-CM | POA: Diagnosis not present

## 2017-09-13 DIAGNOSIS — Z87891 Personal history of nicotine dependence: Secondary | ICD-10-CM | POA: Diagnosis not present

## 2017-09-13 DIAGNOSIS — G8929 Other chronic pain: Secondary | ICD-10-CM | POA: Diagnosis not present

## 2017-09-13 DIAGNOSIS — M6281 Muscle weakness (generalized): Secondary | ICD-10-CM | POA: Diagnosis not present

## 2017-09-13 DIAGNOSIS — K7030 Alcoholic cirrhosis of liver without ascites: Secondary | ICD-10-CM | POA: Diagnosis not present

## 2017-09-13 DIAGNOSIS — M8008XD Age-related osteoporosis with current pathological fracture, vertebra(e), subsequent encounter for fracture with routine healing: Secondary | ICD-10-CM | POA: Diagnosis not present

## 2017-09-15 DIAGNOSIS — Z87891 Personal history of nicotine dependence: Secondary | ICD-10-CM | POA: Diagnosis not present

## 2017-09-15 DIAGNOSIS — J449 Chronic obstructive pulmonary disease, unspecified: Secondary | ICD-10-CM | POA: Diagnosis not present

## 2017-09-15 DIAGNOSIS — G8929 Other chronic pain: Secondary | ICD-10-CM | POA: Diagnosis not present

## 2017-09-15 DIAGNOSIS — K7030 Alcoholic cirrhosis of liver without ascites: Secondary | ICD-10-CM | POA: Diagnosis not present

## 2017-09-15 DIAGNOSIS — M8008XD Age-related osteoporosis with current pathological fracture, vertebra(e), subsequent encounter for fracture with routine healing: Secondary | ICD-10-CM | POA: Diagnosis not present

## 2017-09-15 DIAGNOSIS — M6281 Muscle weakness (generalized): Secondary | ICD-10-CM | POA: Diagnosis not present

## 2017-09-17 ENCOUNTER — Ambulatory Visit (INDEPENDENT_AMBULATORY_CARE_PROVIDER_SITE_OTHER): Payer: Medicare Other | Admitting: Internal Medicine

## 2017-09-17 ENCOUNTER — Encounter: Payer: Self-pay | Admitting: Internal Medicine

## 2017-09-17 VITALS — BP 126/64 | HR 122 | Ht 59.0 in | Wt 77.0 lb

## 2017-09-17 DIAGNOSIS — K7030 Alcoholic cirrhosis of liver without ascites: Secondary | ICD-10-CM | POA: Diagnosis not present

## 2017-09-17 DIAGNOSIS — M6281 Muscle weakness (generalized): Secondary | ICD-10-CM | POA: Diagnosis not present

## 2017-09-17 DIAGNOSIS — Z87891 Personal history of nicotine dependence: Secondary | ICD-10-CM | POA: Diagnosis not present

## 2017-09-17 DIAGNOSIS — J449 Chronic obstructive pulmonary disease, unspecified: Secondary | ICD-10-CM

## 2017-09-17 DIAGNOSIS — G8929 Other chronic pain: Secondary | ICD-10-CM | POA: Diagnosis not present

## 2017-09-17 DIAGNOSIS — M8008XD Age-related osteoporosis with current pathological fracture, vertebra(e), subsequent encounter for fracture with routine healing: Secondary | ICD-10-CM | POA: Diagnosis not present

## 2017-09-17 MED ORDER — AZITHROMYCIN 250 MG PO TABS: ORAL_TABLET | ORAL | 0 refills | Status: DC

## 2017-09-17 MED ORDER — PREDNISONE 10 MG PO TABS: ORAL_TABLET | ORAL | 0 refills | Status: DC

## 2017-09-17 NOTE — Patient Instructions (Addendum)
Prednisone 10 mg take  4 each am x 2 days,   2 each am x 2 days,  1 each am x 2 days and stop   If mucus turns nasty >  zpak   Work on inhaler technique:  relax and gently blow all the way out then take a nice smooth deep breath back in, triggering the inhaler at same time you start breathing in.  Hold for up to 5 seconds if you can.   Rinse and gargle with water when done      Please schedule a follow up visit in 6  months but call sooner if needed

## 2017-09-17 NOTE — Progress Notes (Signed)
Subjective:    Patient ID: Cassidy Barrett, female    DOB: 12-Nov-1945   MRN: [id number]    Brief patient profile:  72 yo former smoker (04/2014) referred by [facility] after hospitalization for pleural effusion, CAP and COPD exacerbation . Patient has alcoholic cirrhosis, ascites, hyponatremia   06/25/2014  [facility] follow up / NP eval  Patient was admitted October 19 through October 30 for COPD exacerbation, acute hypoxic respiratory failure with community-acquired pneumonia and a right pleural effusion She was treated with IV antibiotics. CT chest showed a moderate layering right pleural effusion with compression of the right lung. CT abdomen showed possible liver cirrhosis with ascites and a 3-4 cm soft tissue mass in the rectum. Labs were remarkable for elevated LFTs and low sodium She underwent a paracentesis with 140 cc removed. She was seen by nephrology, and was treated with hypertonic IV fluids and salt tablets with improvement of her sodium Sodium returned to normal prior to discharge. Patient was seen by gastroenterology and underwent colonoscopy with polyp removal , path showed a tubovillous adenoma.  No formal dx of COPD in past. Has quit smoking since discharge. Denies any alcohol intake since discharge Since discharge, she is feeling improved. She has decreased shortness of breath or cough. Chest x-ray today is improved with resolution of pleural effusion. She denies any chest pain, orthopnea, PND or leg swelling She has home health and physical therapy at home Feels that her strength is improving. rec Great job on not smoking -keep up good work.  Follow up Dr. Gwenette Greet in 4-6 weeks with PFT > did not return    03/02/2017 1st [facility] office visit/ re-establish care/ Cassidy Barrett  / stopped smoking 02/24/17  Chief Complaint  Patient presents with  . Pulmonary Consult    Referred by PCP. Pt c/o SOB for the past wk. She states she gets SOB with walking short distances such as  from room to room at home. She also gets SOB lying down and wakes up feeling breathless.   best  Day =  3 weeks prior to OV  Limited by knee and back more than doe and no need for inhalers Onset of tachycardia was 4 weeks prior to OV  > rx metaprolol around 3 weeks prior to OV worse sob so > coreg 8/1-8/5   But no better and then improved off coreg x 48 h prior to OV   rx ventolin seems to help only used 20 doses since this flare  Doe = MMRC4  = sob if tries to leave home or while getting dressed   Also nightly sob unless sleeps upright x 3 weeks  rec Plan A = Automatic = Spiriva 2.'5mg'$   X 2 pffs each am Work on inhaler technique: Plan B = Backup Only use your albuterol (ventolin) as a rescue medication  Strongly prefer in this setting if need a beta blocker:  bisoprolol the most selective generic choice  on the market.      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 Follow-up    patient is doing better  [facility] = can't walk a nl pace on a flat grade s sob but does fine slow and flat eg shopping leaning on rolator  Using albuterol up to twice weekly  rec Plan A = Automatic = Incruse  or Anoro one click  each am - take two good  drags smooth and deep and then rinse your mouth Plan B = Backup Only use your albuterol as a rescue medication    07/30/17  NP  Zpack take as directed .  Mucinex DM Twice daily  As needed  Cough/congestion  Prednisone taper over next week .  Fluids and rest  follow up Dr. Melvyn Novas  As planned and As needed     09/17/2017  [name]  Chief Complaint  Patient presents with  . Follow-up    Sinus pressure and congestion for the past 3 days. She is coughing some but no sputum production. Breathing is overall doing well. She is using her albuterol inhaler 4 x per wk on average.    Dyspnea:  Back slows her down more than sob   Cough: nasal congestion  assoc pnds type cough day > noct  Sleep: ok  Prednisone didn't help nor did inhalers    No obvious patterns in  day to day or daytime variability or assoc excess/ purulent sputum or mucus plugs or hemoptysis or cp or chest tightness, subjective wheeze or overt  hb symptoms. No unusual exposure hx or h/o childhood pna/ asthma or knowledge of premature birth.  Sleeping ok flat without nocturnal  or early am exacerbation  of respiratory  c/o's or need for noct saba. Also denies any obvious fluctuation of symptoms with weather or environmental changes or other aggravating or alleviating factors except as outlined above   Current Allergies, Complete Past Medical History, Past Surgical History, Family History, and Social History were reviewed in [employer] record.  ROS  The following are not active complaints unless bolded Hoarseness, sore throat, dysphagia, dental problems, itching, sneezing,  nasal congestion or discharge of excess mucus or purulent secretions, ear ache,   fever, chills, sweats, unintended wt loss or wt gain, classically pleuritic or exertional cp,  orthopnea pnd or leg swelling, presyncope, palpitations, abdominal pain, anorexia, nausea, vomiting, diarrhea  or change in bowel habits or change in bladder habits, change in stools or change in urine, dysuria, hematuria,  rash, arthralgias, visual complaints, headache, numbness, weakness or ataxia or problems with walking or coordination,  change in mood/affect or memory.        Current Meds  Medication Sig  . albuterol (PROVENTIL HFA;VENTOLIN HFA) 108 (90 Base) MCG/ACT inhaler Inhale 2 puffs into the lungs every 6 (six) hours as needed for wheezing or shortness of breath.  Marland Kitchen amLODipine (NORVASC) 2.5 MG tablet Take 2.5 mg by mouth daily.  . calcitonin, salmon, (MIACALCIN/FORTICAL) 200 UNIT/ACT nasal spray U 1 SPRAY NASALLY D. ALTERNATING NOSTRILS  . Calcium Citrate-Vitamin D (CALCIUM + D PO) Take 1 tablet by  mouth daily.   . cyclobenzaprine (FLEXERIL) 5 MG tablet Take 5-10 mg by mouth 3 (three) times daily as needed for muscle spasms. 2 to 3 times daily as needed for spasm  . ibuprofen (ADVIL,MOTRIN) 600 MG tablet Take 600 mg by mouth every 6 (six) hours as needed for moderate pain.   Marland Kitchen lidocaine (LIDODERM) 5 % Place 1-2 patches onto the skin daily as needed (severe pain). Remove & Discard patch within 12 hours or as directed by MD  . morphine (MSIR) 15 MG tablet Take 1 tablet (15 mg total) by mouth every 6 (six) hours as needed for severe pain.  . Multiple Vitamins-Minerals (MULTIVITAMIN WITH MINERALS) tablet Take 1 tablet by mouth daily.  . traMADol (ULTRAM) 50 MG tablet Take 50-100 mg by mouth 2 (  two) times daily as needed for moderate pain.  Marland Kitchen trolamine salicylate (ASPERCREME) 10 % cream Apply 1 application topically as needed for muscle pain.  Marland Kitchen umeclidinium-vilanterol (ANORO ELLIPTA) 62.5-25 MCG/INH AEPB Inhale 1 puff daily into the lungs.                                   Objective:   Physical Exam   Elderly wf ? Stated age using rollator   09/17/2017        79  03/02/17 80 lb (36.3 kg)  01/21/17 81 lb 9.6 oz (37 kg)  01/01/17 80 lb 12.8 oz (36.7 kg)     Vital signs reviewed - Note on arrival 02 sats  96% on RA     Edentulous  .HEENT: nl   turbinates bilaterally, and oropharynx. Nl external ear canals without cough reflex - edentulous   NECK :  without JVD/Nodes/TM/ nl carotid upstrokes bilaterally   LUNGS: no acc muscle use,  Barrel chest/ kyphotic  with distant bs/ no wheeze and no cough on insp/exp   CV:  RRR  no s3 or murmur or increase in P2, and no edema   ABD:  soft and nontender with nl inspiratory excursion in the supine position. No bruits or organomegaly appreciated, bowel sounds nl  MS:  Very slow gait/ uses rollator/ ext warm without deformities, calf tenderness, cyanosis or clubbing No obvious joint restrictions   SKIN: warm and dry  without lesions    NEURO:  alert, approp, nl sensorium with  no motor or cerebellar deficits apparent.              Assessment & Plan:

## 2017-09-19 ENCOUNTER — Encounter: Payer: Self-pay | Admitting: Internal Medicine

## 2017-09-19 NOTE — Assessment & Plan Note (Addendum)
Quit smoking 02/24/17 -  Spirometry 03/02/2017  FEV1 0.27 (14%)  Ratio 40 with severe curvature 15 min p saba - 03/02/2017  After extensive coaching device  effectiveness =   75% (short Ti) with smi > try spiriva 2.5   > insurance did not cover so changed to incruse - PFT's  05/26/2017  FEV1 0.73 (39 % ) ratio 40   p no % improvement from saba p incruse 18h  prior to study with DLCO  79/70c % corrects to 109  % for alv volume   - 05/26/2017    try anoro one click each am      1/55/2080  After extensive coaching inhaler device  effectiveness =    50% with hfa/ 90% with dpi    Very mild flare ? Needs ICS next but says even prednisone hasn't helped in past so rec one six day trial then consider change anoro to Trelegy as she has masted the elipat device and continue prn saba but work in Geologist, engineering   I had an extended discussion with the patient reviewing all relevant studies completed to date and  lasting 10 minutes of a 15 minute office  visit    Each maintenance medication was reviewed in detail including most importantly the difference between maintenance and prns and under what circumstances the prns are to be triggered using an action plan format that is not reflected in the computer generated alphabetically organized AVS.    Please see AVS for specific instructions unique to this visit that I personally wrote and verbalized to the the pt in detail and then reviewed with pt  by my nurse highlighting any  changes in therapy recommended at today's visit to their plan of care.

## 2017-09-20 DIAGNOSIS — M8008XD Age-related osteoporosis with current pathological fracture, vertebra(e), subsequent encounter for fracture with routine healing: Secondary | ICD-10-CM | POA: Diagnosis not present

## 2017-09-20 DIAGNOSIS — Z87891 Personal history of nicotine dependence: Secondary | ICD-10-CM | POA: Diagnosis not present

## 2017-09-20 DIAGNOSIS — K7030 Alcoholic cirrhosis of liver without ascites: Secondary | ICD-10-CM | POA: Diagnosis not present

## 2017-09-20 DIAGNOSIS — G8929 Other chronic pain: Secondary | ICD-10-CM | POA: Diagnosis not present

## 2017-09-20 DIAGNOSIS — J449 Chronic obstructive pulmonary disease, unspecified: Secondary | ICD-10-CM | POA: Diagnosis not present

## 2017-09-20 DIAGNOSIS — M6281 Muscle weakness (generalized): Secondary | ICD-10-CM | POA: Diagnosis not present

## 2017-09-22 DIAGNOSIS — M6281 Muscle weakness (generalized): Secondary | ICD-10-CM | POA: Diagnosis not present

## 2017-09-22 DIAGNOSIS — M8008XD Age-related osteoporosis with current pathological fracture, vertebra(e), subsequent encounter for fracture with routine healing: Secondary | ICD-10-CM | POA: Diagnosis not present

## 2017-09-22 DIAGNOSIS — K7030 Alcoholic cirrhosis of liver without ascites: Secondary | ICD-10-CM | POA: Diagnosis not present

## 2017-09-22 DIAGNOSIS — G8929 Other chronic pain: Secondary | ICD-10-CM | POA: Diagnosis not present

## 2017-09-22 DIAGNOSIS — J449 Chronic obstructive pulmonary disease, unspecified: Secondary | ICD-10-CM | POA: Diagnosis not present

## 2017-09-22 DIAGNOSIS — Z87891 Personal history of nicotine dependence: Secondary | ICD-10-CM | POA: Diagnosis not present

## 2017-09-23 NOTE — Progress Notes (Signed)
[facility]    HEMATOLOGY/ONCOLOGY CLINIC NOTE  Date of Service: 09/27/17   Patient Care Team: Lanice Shirts, MD as PCP - General (Internal Medicine) Jeralene Peters, DDS as Dietitian (Periodontics)  Orthopedics - Dr. Layne Benton from [facility]   CHIEF COMPLAINTS/PURPOSE OF CONSULTATION:   F/u for smoldering multiple myeloma  HISTORY OF PRESENTING ILLNESS:   Cassidy Barrett is a wonderful 72 y.o. female who has been referred to [place] by Dr Wandra Feinstein for evaluation and management of possible plasma cell dyscrasia.  Patient has a history of liver cirrhosis with ascites, alcohol abuse, emphysema, macrocytic anemia, osteoporosis and was seen by orthopedics for spinal vertebral compression fractures in the setting of osteoporosis.  She was noted to have multiple significant lab abnormalities including an elevated alkaline phosphatase and high total protein level.  Chronic appealing L1-L2 compression fractures  Patient subsequently had a bone scan to evaluate these compression fractures on 09/26/2015.  This showed mild increased activity within the superior endplates of L3, L4 and L5 likely representing subacute fractures. Increased activity within the T2 vertebral body compatible with known compression fracture. No evidence of suggest bony metastatic disease.  As a part of her workup she also had an SPEP done which shows 3 monoclonal protein bands for a total M protein of 2.3 g/dL. On IFE this is noted to be monoclonal IgA kappa protein. Her sedimentation rate was also elevated at 73.  She was referred to [place] for further evaluation to rule out multiple myeloma. Patient notes her mid and lower back pain related to her compression fractures. Also notes some left hip pain. Her labs from January 2017 did not show any anemia, hypercalcemia or renal failure.  She notes that she did have a colonoscopy in 2015 which demonstrated a 4.5 cm rectal polyp which was removed  piecemeal. Pathology showed a tubulovillous adenoma. Patient was also noted to have significant diverticulosis and was not recommended routine colonoscopies. Given her CT scan showing concern of rectal mass she was referred back to Dr. Henrene Pastor at [place] for consideration of a sigmoidoscopy/colonoscopy. Patient notes no overt rectal bleeding. No obstructive symptoms. No change in bowel habits. No new abdominal pain. She also needs to connect with gi for management of her liver cirrhosis and related issues.   INTERVAL HISTORY:  Cassidy Barrett is here for her scheduled followup for smoldering myeloma. The patient's last visit with [place] was on 05/31/17. The pt reports that she is doing very well overall.   Of note since the patient last visit, pt has had a T7 compression fracture, after presenting for an URI 11 weeks ago. She has also had all of her teeth pulled since her last visit with [place]. The pt reports that Dr. Hal Morales can give Prolia shots after her dental extractions have completely healed. She is still taking Calcitonin for her osteoporosis.   Lab results today (09/27/17) of CBC and Reticulocytes is as follows: all values are WNL except for MCV at 101.5. CMP 09/27/17 is pending.  MMP 09/27/17 is pending. Kappa/lambda 09/27/17 is pending.  On review of systems, pt reports eating well, steady weight, regular breathing, some back pain and denies any other symptoms.   MEDICAL HISTORY:  Past Medical History:  Diagnosis Date  . Alcohol abuse   . Arthritis   . Ascites   . Atherosclerosis of aorta ([place])   . Cholelithiasis   . Cirrhosis ([place])   . Complication of anesthesia    Difficult time  waking up after having biospy with colonoscopy  . Compression fracture of lumbar vertebra (HCC)     L1 and L2  . Emphysema of lung ([place])   . History of multiple myeloma    smoldering  . Hyponatremia   . Macrocytic anemia   . 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 Osteoporosis   . Pneumonia 04/2014  . Tobacco abuse    . Tubular adenoma   . Vertebral compression fracture (HCC)    ?? 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  SURGICAL HISTORY: Past Surgical History:  Procedure Laterality Date  . COLONOSCOPY N/A 05/24/2014   Procedure: COLONOSCOPY;  Surgeon: Gatha Mayer, MD;  Location: [facility];  Service: Endoscopy;  Laterality: N/A;  MAC if possible ultraslim colonoscope and gastroscope needed  . FLEXIBLE SIGMOIDOSCOPY N/A 05/21/2014   Procedure: FLEXIBLE SIGMOIDOSCOPY;  Surgeon: Gatha Mayer, MD;  Location: [facility];  Service: Endoscopy;  Laterality: N/A;  . KYPHOPLASTY N/A 12/12/2014   Procedure: KYPHOPLASTY;  Surgeon: Phylliss Bob, MD;  Location: [facility];  Service: Orthopedics;  Laterality: N/A;  T10 kyphoplasty  . MANDIBLE FRACTURE SURGERY     X 2  . TONSILLECTOMY      SOCIAL HISTORY: Social History   Socioeconomic History  . Marital status: Married    Spouse name: Not on file  . Number of children: Not on file  . Years of education: Not on file  . Highest education level: Not on file  Social Needs  . Financial resource strain: Not on file  . Food insecurity - worry: Not on file  . Food insecurity - inability: Not on file  . Transportation needs - medical: Not on file  . Transportation needs - non-medical: Not on file  Occupational History  . Occupation: retired -[employer]  Tobacco Use  . Smoking status: Former Smoker    Packs/day: 0.25    Years: 46.00    Pack years: 11.50    Types: Cigarettes    Last attempt to quit: 02/24/2017    Years since quitting: 0.5  . Smokeless tobacco: Never Used  Substance and Sexual Activity  . Alcohol use: Yes    Alcohol/week: 0.0 oz    Comment: occasional  . Drug use: No  . Sexual activity: Not on file  Other Topics Concern  . Not on file  Social History Narrative  . Not on file   Patient notes that she is drinking about 1 beer and 1 hard liquor every day - was counseled regarding absolute  alcohol abstinence.  FAMILY HISTORY:  No family history of colon cancer.  ALLERGIES:  is allergic to benadryl [diphenhydramine]; hydrocodone-acetaminophen; oxycodone; tape; and valium [diazepam].  MEDICATIONS:  Current Outpatient Medications  Medication Sig Dispense Refill  . albuterol (PROVENTIL HFA;VENTOLIN HFA) 108 (90 Base) MCG/ACT inhaler Inhale 2 puffs into the lungs every 6 (six) hours as needed for wheezing or shortness of breath. 1 Inhaler 2  . amLODipine (NORVASC) 2.5 MG tablet Take 2.5 mg by mouth daily.    Marland Kitchen azithromycin (ZITHROMAX) 250 MG tablet Take 2 on day one then 1 daily x 4 days 6 tablet 0  . calcitonin, salmon, (MIACALCIN/FORTICAL) 200 UNIT/ACT nasal spray U 1 SPRAY NASALLY D. ALTERNATING NOSTRILS  3  . Calcium Citrate-Vitamin D (CALCIUM + D PO) Take 1 tablet by mouth daily.     . cyclobenzaprine (FLEXERIL) 5 MG tablet Take 5-10 mg by mouth 3 (three) times daily as needed for muscle spasms. 2 to 3  times daily as needed for spasm    . ibuprofen (ADVIL,MOTRIN) 600 MG tablet Take 600 mg by mouth every 6 (six) hours as needed for moderate pain.     Marland Kitchen lidocaine (LIDODERM) 5 % Place 1-2 patches onto the skin daily as needed (severe pain). Remove & Discard patch within 12 hours or as directed by MD    . morphine (MSIR) 15 MG tablet Take 1 tablet (15 mg total) by mouth every 6 (six) hours as needed for severe pain. 15 tablet 0  . Multiple Vitamins-Minerals (MULTIVITAMIN WITH MINERALS) tablet Take 1 tablet by mouth daily.    . predniSONE (DELTASONE) 10 MG tablet Take  4 each am x 2 days,   2 each am x 2 days,  1 each am x 2 days and stop 14 tablet 0  . traMADol (ULTRAM) 50 MG tablet Take 50-100 mg by mouth 2 (two) times daily as needed for moderate pain.    Marland Kitchen trolamine salicylate (ASPERCREME) 10 % cream Apply 1 application topically as needed for muscle pain.    Marland Kitchen umeclidinium-vilanterol (ANORO ELLIPTA) 62.5-25 MCG/INH AEPB Inhale 1 puff daily into the lungs. 60 each 6   No  current facility-administered medications for this visit.     REVIEW OF SYSTEMS:    .10 Point review of Systems was done is negative except as noted above.   PHYSICAL EXAMINATION: ECOG PERFORMANCE STATUS: 2 - Symptomatic, <50% confined to bed  . Vitals:   09/27/17 1306  BP: (!) 125/91  Pulse: (!) 124  Resp: 20  Temp: 98.5 F (36.9 C)  SpO2: 95%   Filed Weights   09/27/17 1306  Weight: 86 lb (39 kg)   .Body mass index is 17.37 kg/m.    LABORATORY DATA:  I have reviewed the data as listed  . CBC Latest Ref Rng & Units 08/13/2017 05/31/2017 01/21/2017  WBC 4.0 - 10.5 K/uL 13.0(H) 7.5 8.7  Hemoglobin 12.0 - 15.0 g/dL 13.7 14.5 12.8  Hematocrit 36.0 - 46.0 % 38.3 41.5 37.3  Platelets 150 - 400 K/uL 271 180 295    . CMP Latest Ref Rng & Units 09/27/2017 08/13/2017 05/31/2017  Glucose 70 - 140 mg/dL 115 151(H) 108  BUN 7 - 26 mg/dL 6(L) 7 7.9  Creatinine 0.60 - 1.10 mg/dL 0.61 0.50 0.6  Sodium 136 - 145 mmol/L 134(L) 131(L) 135(L)  Potassium 3.5 - 5.1 mmol/L 3.9 3.6 3.4(L)  Chloride 98 - 109 mmol/L 96(L) 98(L) -  CO2 22 - 29 mmol/L _0 Calcium 8.4 - 10.4 mg/dL 8.9 9.0 9.4  Total Protein 6.4 - 8.3 g/dL 7.8 - 8.7(H)  Total Bilirubin 0.2 - 1.2 mg/dL 2.1(H) - 0.68  Alkaline Phos 40 - 150 U/L 709(H) - 234(H)  AST 5 - 34 U/L 82(H) - 38(H)  ALT 0 - 55 U/L 34 - 21          RADIOGRAPHIC STUDIES: I have personally reviewed the radiological images as listed and agreed with the findings in the report. No results found.   ASSESSMENT & PLAN:   72 year old Caucasian female with severe osteoporosis and vertebral compression fractures  #1 IgA kappa Smoldering Multiple myeloma    M protein level stable today ( 1.5-->1.8 ---> 1.9-->1.8-->1.7) Labs with no signs of anemia, hypercalcemia or renal failure. No overt focal new bone pains. Bm Bx shows 21% abnormal plasma cells. Rpt Bone Survey on 05/12/2016 -- showed no evidence of lytic lesions. Plan -No clear signs of  progression to  multiple myeloma at this time.  -SPEP and K/L SFLC levels were reviewed and was stable/better. -will hold off on definitive treatments for multiple myeloma unless there is any overt evidence of end organ injury meeting criteria for multiple myeloma. -Return to care with Dr. Irene Limbo in 4 months with repeat CBC, CMP and myeloma panel. -Discussed pt labwork today; blood counts are stable, chemistries are pending  -Will continue to watch for anemia, liver problems, calcium irregularities to monitor her smoldering myeloma.  -If her M protein continues to climb from the tests that are pending today, we may need to repeat full body scan. -Smolder myeloma as a risk factor for her osteoporosis.    #2 liver cirrhosis with known varices Plan -Counseled on absolute alcohol cessation. -continue f/u with PCP  #3 osteoporosis with vertebral compression fractures.   #4 ?? history of osteonecrosis of the jaw with Fosamax/bisphosphonates previously . Unclear if this truly represents ONJ ---patient decribes it as "having brittle teeth" Plan  -Patient has been following with orthopedics Dr. Wandra Feinstein to manage her osteoporosis . -continue aggressive vitamin D replacement . -Pt has had full dental extraction and is recovering well.  -mxper orthopedics.   RTC with Dr Irene Limbo in 4 months with labs   All of the patients and her female partners questions were answered to their apparent satisfaction. The patient knows to call the clinic with any problems, questions or concerns.  . The total time spent in the appointment was 15 minutes and more than 50% was on counseling and direct patient cares.    Sullivan Lone MD [place] AAHIVMS [facility] Hematology/Oncology Physician [place]  (Office):       [phone] (Work cell):  [phone] (Fax):           [phone]  This document serves as a record of services personally performed by Sullivan Lone, MD. It was created on his behalf by  Baldwin Jamaica, a trained medical scribe. The creation of this record is based on the scribe's personal observations and the provider's statements to them.   .I have reviewed the above documentation for accuracy and completeness, and I agree with the above. Brunetta Genera MD MS

## 2017-09-27 ENCOUNTER — Inpatient Hospital Stay (HOSPITAL_BASED_OUTPATIENT_CLINIC_OR_DEPARTMENT_OTHER): Payer: Medicare Other | Admitting: Hematology

## 2017-09-27 ENCOUNTER — Telehealth: Payer: Self-pay | Admitting: Hematology

## 2017-09-27 ENCOUNTER — Inpatient Hospital Stay: Payer: Medicare Other | Attending: Hematology

## 2017-09-27 VITALS — BP 125/91 | HR 124 | Temp 98.5°F | Resp 20 | Ht 59.0 in | Wt 86.0 lb

## 2017-09-27 DIAGNOSIS — M810 Age-related osteoporosis without current pathological fracture: Secondary | ICD-10-CM | POA: Diagnosis not present

## 2017-09-27 DIAGNOSIS — R188 Other ascites: Secondary | ICD-10-CM | POA: Diagnosis not present

## 2017-09-27 DIAGNOSIS — F1010 Alcohol abuse, uncomplicated: Secondary | ICD-10-CM | POA: Diagnosis not present

## 2017-09-27 DIAGNOSIS — C9000 Multiple myeloma not having achieved remission: Secondary | ICD-10-CM | POA: Insufficient documentation

## 2017-09-27 DIAGNOSIS — J439 Emphysema, unspecified: Secondary | ICD-10-CM

## 2017-09-27 DIAGNOSIS — M1990 Unspecified osteoarthritis, unspecified site: Secondary | ICD-10-CM | POA: Diagnosis not present

## 2017-09-27 DIAGNOSIS — D472 Monoclonal gammopathy: Secondary | ICD-10-CM

## 2017-09-27 DIAGNOSIS — Z79899 Other long term (current) drug therapy: Secondary | ICD-10-CM

## 2017-09-27 DIAGNOSIS — Z87891 Personal history of nicotine dependence: Secondary | ICD-10-CM | POA: Diagnosis not present

## 2017-09-27 DIAGNOSIS — M25552 Pain in left hip: Secondary | ICD-10-CM

## 2017-09-27 DIAGNOSIS — K0889 Other specified disorders of teeth and supporting structures: Secondary | ICD-10-CM

## 2017-09-27 DIAGNOSIS — M818 Other osteoporosis without current pathological fracture: Secondary | ICD-10-CM

## 2017-09-27 DIAGNOSIS — K7460 Unspecified cirrhosis of liver: Secondary | ICD-10-CM | POA: Insufficient documentation

## 2017-09-27 DIAGNOSIS — Z87311 Personal history of (healed) other pathological fracture: Secondary | ICD-10-CM | POA: Insufficient documentation

## 2017-09-27 LAB — COMPREHENSIVE METABOLIC PANEL
ALBUMIN: 2.6 g/dL — AB (ref 3.5–5.0)
ALT: 34 U/L (ref 0–55)
AST: 82 U/L — AB (ref 5–34)
Alkaline Phosphatase: 709 U/L — ABNORMAL HIGH (ref 40–150)
Anion gap: 11 (ref 3–11)
BUN: 6 mg/dL — AB (ref 7–26)
CHLORIDE: 96 mmol/L — AB (ref 98–109)
CO2: 27 mmol/L (ref 22–29)
CREATININE: 0.61 mg/dL (ref 0.60–1.10)
Calcium: 8.9 mg/dL (ref 8.4–10.4)
GFR calc Af Amer: >60 mL/min (ref >60)
GFR calc non Af Amer: >60 mL/min (ref >60)
Glucose, Bld: 115 mg/dL (ref 70–140)
Potassium: 3.9 mmol/L (ref 3.5–5.1)
SODIUM: 134 mmol/L — AB (ref 136–145)
Total Bilirubin: 2.1 mg/dL — ABNORMAL HIGH (ref 0.2–1.2)
Total Protein: 7.8 g/dL (ref 6.4–8.3)

## 2017-09-27 LAB — CBC WITH DIFFERENTIAL (CANCER CENTER ONLY)
Basophils Absolute: 0 10*3/uL (ref 0.0–0.1)
Basophils Relative: 0 %
EOS ABS: 0.3 10*3/uL (ref 0.0–0.5)
EOS PCT: 4 %
HCT: 41.3 % (ref 34.8–46.6)
Hemoglobin: 14.1 g/dL (ref 11.6–15.9)
LYMPHS ABS: 1.4 10*3/uL (ref 0.9–3.3)
LYMPHS PCT: 19 %
MCH: 34.6 pg — AB (ref 25.1–34.0)
MCHC: 34.1 g/dL (ref 31.5–36.0)
MCV: 101.5 fL — AB (ref 79.5–101.0)
MONOS PCT: 11 %
Monocytes Absolute: 0.8 10*3/uL (ref 0.1–0.9)
Neutro Abs: 4.7 10*3/uL (ref 1.5–6.5)
Neutrophils Relative %: 66 %
PLATELETS: 252 10*3/uL (ref 145–400)
RBC: 4.07 MIL/uL (ref 3.70–5.45)
RDW: 14 % (ref 11.2–14.5)
WBC Count: 7.1 10*3/uL (ref 3.9–10.3)

## 2017-09-27 LAB — RETICULOCYTES
RBC.: 4.07 MIL/uL (ref 3.70–5.45)
RETIC CT PCT: 2.1 % (ref 0.7–2.1)
Retic Count, Absolute: 85.5 10*3/uL (ref 33.7–90.7)

## 2017-09-27 NOTE — Telephone Encounter (Signed)
Appointments scheduled AVS/Calendar printed per 3-4 los °

## 2017-09-28 LAB — KAPPA/LAMBDA LIGHT CHAINS
Kappa free light chain: 63 mg/L — ABNORMAL HIGH (ref 3.3–19.4)
Kappa, lambda light chain ratio: 4.29 — ABNORMAL HIGH (ref 0.26–1.65)
Lambda free light chains: 14.7 mg/L (ref 5.7–26.3)

## 2017-09-29 ENCOUNTER — Other Ambulatory Visit: Payer: Self-pay | Admitting: Internal Medicine

## 2017-09-29 DIAGNOSIS — K7030 Alcoholic cirrhosis of liver without ascites: Secondary | ICD-10-CM | POA: Diagnosis not present

## 2017-09-29 DIAGNOSIS — M8008XD Age-related osteoporosis with current pathological fracture, vertebra(e), subsequent encounter for fracture with routine healing: Secondary | ICD-10-CM | POA: Diagnosis not present

## 2017-09-29 DIAGNOSIS — G8929 Other chronic pain: Secondary | ICD-10-CM | POA: Diagnosis not present

## 2017-09-29 DIAGNOSIS — M6281 Muscle weakness (generalized): Secondary | ICD-10-CM | POA: Diagnosis not present

## 2017-09-29 DIAGNOSIS — Z87891 Personal history of nicotine dependence: Secondary | ICD-10-CM | POA: Diagnosis not present

## 2017-09-29 DIAGNOSIS — J449 Chronic obstructive pulmonary disease, unspecified: Secondary | ICD-10-CM | POA: Diagnosis not present

## 2017-09-29 LAB — MULTIPLE MYELOMA PANEL, SERUM
Albumin SerPl Elph-Mcnc: 2.9 g/dL (ref 2.9–4.4)
Albumin/Glob SerPl: 0.7 (ref 0.7–1.7)
Alpha 1: 0.3 g/dL (ref 0.0–0.4)
Alpha2 Glob SerPl Elph-Mcnc: 0.6 g/dL (ref 0.4–1.0)
B-GLOBULIN SERPL ELPH-MCNC: 2.6 g/dL — AB (ref 0.7–1.3)
GAMMA GLOB SERPL ELPH-MCNC: 1 g/dL (ref 0.4–1.8)
GLOBULIN, TOTAL: 4.5 g/dL — AB (ref 2.2–3.9)
IgA: 3158 mg/dL — ABNORMAL HIGH (ref 87–352)
IgG (Immunoglobin G), Serum: 844 mg/dL (ref 700–1600)
IgM (Immunoglobulin M), Srm: 203 mg/dL (ref 26–217)
M PROTEIN SERPL ELPH-MCNC: 1.7 g/dL — AB
TOTAL PROTEIN ELP: 7.4 g/dL (ref 6.0–8.5)

## 2017-09-30 DIAGNOSIS — J449 Chronic obstructive pulmonary disease, unspecified: Secondary | ICD-10-CM | POA: Diagnosis not present

## 2017-09-30 DIAGNOSIS — M6281 Muscle weakness (generalized): Secondary | ICD-10-CM | POA: Diagnosis not present

## 2017-09-30 DIAGNOSIS — M8008XD Age-related osteoporosis with current pathological fracture, vertebra(e), subsequent encounter for fracture with routine healing: Secondary | ICD-10-CM | POA: Diagnosis not present

## 2017-09-30 DIAGNOSIS — Z87891 Personal history of nicotine dependence: Secondary | ICD-10-CM | POA: Diagnosis not present

## 2017-09-30 DIAGNOSIS — K7030 Alcoholic cirrhosis of liver without ascites: Secondary | ICD-10-CM | POA: Diagnosis not present

## 2017-09-30 DIAGNOSIS — G8929 Other chronic pain: Secondary | ICD-10-CM | POA: Diagnosis not present

## 2017-10-04 DIAGNOSIS — G8929 Other chronic pain: Secondary | ICD-10-CM | POA: Diagnosis not present

## 2017-10-04 DIAGNOSIS — K7030 Alcoholic cirrhosis of liver without ascites: Secondary | ICD-10-CM | POA: Diagnosis not present

## 2017-10-04 DIAGNOSIS — M6281 Muscle weakness (generalized): Secondary | ICD-10-CM | POA: Diagnosis not present

## 2017-10-04 DIAGNOSIS — Z87891 Personal history of nicotine dependence: Secondary | ICD-10-CM | POA: Diagnosis not present

## 2017-10-04 DIAGNOSIS — J449 Chronic obstructive pulmonary disease, unspecified: Secondary | ICD-10-CM | POA: Diagnosis not present

## 2017-10-04 DIAGNOSIS — M8008XD Age-related osteoporosis with current pathological fracture, vertebra(e), subsequent encounter for fracture with routine healing: Secondary | ICD-10-CM | POA: Diagnosis not present

## 2017-10-06 DIAGNOSIS — J449 Chronic obstructive pulmonary disease, unspecified: Secondary | ICD-10-CM | POA: Diagnosis not present

## 2017-10-06 DIAGNOSIS — K7030 Alcoholic cirrhosis of liver without ascites: Secondary | ICD-10-CM | POA: Diagnosis not present

## 2017-10-06 DIAGNOSIS — M8008XD Age-related osteoporosis with current pathological fracture, vertebra(e), subsequent encounter for fracture with routine healing: Secondary | ICD-10-CM | POA: Diagnosis not present

## 2017-10-06 DIAGNOSIS — G8929 Other chronic pain: Secondary | ICD-10-CM | POA: Diagnosis not present

## 2017-10-06 DIAGNOSIS — Z87891 Personal history of nicotine dependence: Secondary | ICD-10-CM | POA: Diagnosis not present

## 2017-10-06 DIAGNOSIS — M6281 Muscle weakness (generalized): Secondary | ICD-10-CM | POA: Diagnosis not present

## 2017-10-11 DIAGNOSIS — G8929 Other chronic pain: Secondary | ICD-10-CM | POA: Diagnosis not present

## 2017-10-11 DIAGNOSIS — J449 Chronic obstructive pulmonary disease, unspecified: Secondary | ICD-10-CM | POA: Diagnosis not present

## 2017-10-11 DIAGNOSIS — M8008XD Age-related osteoporosis with current pathological fracture, vertebra(e), subsequent encounter for fracture with routine healing: Secondary | ICD-10-CM | POA: Diagnosis not present

## 2017-10-11 DIAGNOSIS — Z87891 Personal history of nicotine dependence: Secondary | ICD-10-CM | POA: Diagnosis not present

## 2017-10-11 DIAGNOSIS — K7030 Alcoholic cirrhosis of liver without ascites: Secondary | ICD-10-CM | POA: Diagnosis not present

## 2017-10-11 DIAGNOSIS — M6281 Muscle weakness (generalized): Secondary | ICD-10-CM | POA: Diagnosis not present

## 2017-10-12 DIAGNOSIS — M8008XD Age-related osteoporosis with current pathological fracture, vertebra(e), subsequent encounter for fracture with routine healing: Secondary | ICD-10-CM | POA: Diagnosis not present

## 2017-10-12 DIAGNOSIS — M6281 Muscle weakness (generalized): Secondary | ICD-10-CM | POA: Diagnosis not present

## 2017-10-12 DIAGNOSIS — Z87891 Personal history of nicotine dependence: Secondary | ICD-10-CM | POA: Diagnosis not present

## 2017-10-12 DIAGNOSIS — K7030 Alcoholic cirrhosis of liver without ascites: Secondary | ICD-10-CM | POA: Diagnosis not present

## 2017-10-12 DIAGNOSIS — G8929 Other chronic pain: Secondary | ICD-10-CM | POA: Diagnosis not present

## 2017-10-12 DIAGNOSIS — J449 Chronic obstructive pulmonary disease, unspecified: Secondary | ICD-10-CM | POA: Diagnosis not present

## 2017-10-18 DIAGNOSIS — M8008XD Age-related osteoporosis with current pathological fracture, vertebra(e), subsequent encounter for fracture with routine healing: Secondary | ICD-10-CM | POA: Diagnosis not present

## 2017-10-18 DIAGNOSIS — M6281 Muscle weakness (generalized): Secondary | ICD-10-CM | POA: Diagnosis not present

## 2017-10-18 DIAGNOSIS — Z87891 Personal history of nicotine dependence: Secondary | ICD-10-CM | POA: Diagnosis not present

## 2017-10-18 DIAGNOSIS — G8929 Other chronic pain: Secondary | ICD-10-CM | POA: Diagnosis not present

## 2017-10-18 DIAGNOSIS — J449 Chronic obstructive pulmonary disease, unspecified: Secondary | ICD-10-CM | POA: Diagnosis not present

## 2017-10-18 DIAGNOSIS — K7030 Alcoholic cirrhosis of liver without ascites: Secondary | ICD-10-CM | POA: Diagnosis not present

## 2017-10-20 DIAGNOSIS — M8008XD Age-related osteoporosis with current pathological fracture, vertebra(e), subsequent encounter for fracture with routine healing: Secondary | ICD-10-CM | POA: Diagnosis not present

## 2017-10-20 DIAGNOSIS — Z87891 Personal history of nicotine dependence: Secondary | ICD-10-CM | POA: Diagnosis not present

## 2017-10-20 DIAGNOSIS — M6281 Muscle weakness (generalized): Secondary | ICD-10-CM | POA: Diagnosis not present

## 2017-10-20 DIAGNOSIS — K7030 Alcoholic cirrhosis of liver without ascites: Secondary | ICD-10-CM | POA: Diagnosis not present

## 2017-10-20 DIAGNOSIS — J449 Chronic obstructive pulmonary disease, unspecified: Secondary | ICD-10-CM | POA: Diagnosis not present

## 2017-10-20 DIAGNOSIS — G8929 Other chronic pain: Secondary | ICD-10-CM | POA: Diagnosis not present

## 2017-12-22 DIAGNOSIS — J982 Interstitial emphysema: Secondary | ICD-10-CM | POA: Diagnosis not present

## 2017-12-22 DIAGNOSIS — R748 Abnormal levels of other serum enzymes: Secondary | ICD-10-CM | POA: Diagnosis not present

## 2017-12-22 DIAGNOSIS — K7460 Unspecified cirrhosis of liver: Secondary | ICD-10-CM | POA: Diagnosis not present

## 2017-12-22 DIAGNOSIS — M8080XA Other osteoporosis with current pathological fracture, unspecified site, initial encounter for fracture: Secondary | ICD-10-CM | POA: Diagnosis not present

## 2017-12-22 DIAGNOSIS — C9000 Multiple myeloma not having achieved remission: Secondary | ICD-10-CM | POA: Diagnosis not present

## 2017-12-22 DIAGNOSIS — E46 Unspecified protein-calorie malnutrition: Secondary | ICD-10-CM | POA: Diagnosis not present

## 2017-12-28 ENCOUNTER — Telehealth: Payer: Self-pay | Admitting: Internal Medicine

## 2017-12-28 NOTE — Telephone Encounter (Signed)
Left message for patient to call back  

## 2017-12-28 NOTE — Telephone Encounter (Signed)
Decreased Hgb On Nsaids - told to stop  Also  has cirrhosis  Dr. Rudene Anda is faxing labs   I told her we would contact the patient and get her an appt (App ok)

## 2017-12-29 NOTE — Telephone Encounter (Signed)
Patient has been scheduled to see Tye Savoy RNP on 01/06/18 2:30 .  New patient letter mailed.

## 2017-12-29 NOTE — Telephone Encounter (Signed)
Left message for patient to call back  

## 2018-01-04 ENCOUNTER — Other Ambulatory Visit: Payer: Self-pay | Admitting: Internal Medicine

## 2018-01-06 ENCOUNTER — Encounter (INDEPENDENT_AMBULATORY_CARE_PROVIDER_SITE_OTHER): Payer: Self-pay

## 2018-01-06 ENCOUNTER — Ambulatory Visit (INDEPENDENT_AMBULATORY_CARE_PROVIDER_SITE_OTHER): Payer: Medicare Other | Admitting: Nurse Practitioner

## 2018-01-06 ENCOUNTER — Encounter: Payer: Self-pay | Admitting: Nurse Practitioner

## 2018-01-06 ENCOUNTER — Other Ambulatory Visit (INDEPENDENT_AMBULATORY_CARE_PROVIDER_SITE_OTHER): Payer: Medicare Other

## 2018-01-06 ENCOUNTER — Other Ambulatory Visit: Payer: Self-pay | Admitting: Nurse Practitioner

## 2018-01-06 VITALS — BP 98/48 | HR 72 | Ht 59.0 in | Wt 81.0 lb

## 2018-01-06 DIAGNOSIS — K7460 Unspecified cirrhosis of liver: Secondary | ICD-10-CM | POA: Diagnosis not present

## 2018-01-06 DIAGNOSIS — D539 Nutritional anemia, unspecified: Secondary | ICD-10-CM

## 2018-01-06 LAB — CBC
HCT: 31.1 % — ABNORMAL LOW (ref 36.0–46.0)
Hemoglobin: 10.7 g/dL — ABNORMAL LOW (ref 12.0–15.0)
MCHC: 34.3 g/dL (ref 30.0–36.0)
MCV: 109 fl — ABNORMAL HIGH (ref 78.0–100.0)
Platelets: 318 10*3/uL (ref 150.0–400.0)
RBC: 2.85 Mil/uL — ABNORMAL LOW (ref 3.87–5.11)
RDW: 16.5 % — ABNORMAL HIGH (ref 11.5–15.5)
WBC: 9.4 10*3/uL (ref 4.0–10.5)

## 2018-01-06 MED ORDER — OMEPRAZOLE 40 MG PO CPDR: 40.0000 mg | DELAYED_RELEASE_CAPSULE | Freq: Every day | ORAL | 0 refills | Status: DC

## 2018-01-06 NOTE — Patient Instructions (Addendum)
If you are age 70 or older, your body mass index should be between 23-30. Your Body mass index is 16.36 kg/m. If this is out of the aforementioned range listed, please consider follow up with your Primary Care Provider.  If you are age 10 or younger, your body mass index should be between 19-25. Your Body mass index is 16.36 kg/m. If this is out of the aformentioned range listed, please consider follow up with your Primary Care Provider.   Your provider has requested that you go to the basement level for lab work before leaving today. Press "B" on the elevator. The lab is located at the first door on the left as you exit the elevator.  We have sent the following medications to your pharmacy for you to pick up at your convenience: Omeprazole  NO NSAIDS - Ibuprofen, Advil, Naproxen Etc.  Thank you for choosing me and [facility].   Tye Savoy, NP

## 2018-01-06 NOTE — Progress Notes (Addendum)
Chief Complaint: anemia  Referring Provider:    Emi Belfast, MD    ASSESSMENT AND PLAN;   1. 72 yo female with new mild anemia (hgb 13.1 >>>11.5). She had been taking NSAIDs (stopped two weeks ago). No overt GI bleeding and she says recent stool cards for blood were negative. She is macrocytic but has cirrhosis by imaging and also continues to drink ETOH.  -repeat CBC today -NSAIDS have already been stopped.  -Add daily PPI for now.  -Will call her with CBC results. Further recommendations pending clinical course. Will try to reserve EGD for overt GI bleeding / worsening anemia given her severe COPD / frail health   2. ? Cirrhosis by CT scan in 2015. No evidence for decompensation -Markedly elevated alk phos, ? Secondary to multiple compression fractures.  -[place] screening : When I see her back for anemia follow up will get AFP and arrange for ultrasounds -Varices screening: not a good candidate for surveillance EGD given health problems, especially very severe COPD  3. Hx of large TVA rectal polyp removed Oct 2015. There were no plans for follow up colonoscopy at that time given health status.  If hgb continues to decline or develops blood in stools then we may need to reconsiders. Additionally, if patient requires EGD then it may be worth while proceeding with the colonoscopy as well.   Agree with Ms. Mirta Mally's assessment and plan.  We can consider an EGD if needed - not on home O2 and if we need to check EGD think ok Did not schedule colonoscopy recall due to life expectancy as opposed to sedation risk Gatha Mayer, MD, [facility]   HPI:    Patient is a 72 year old female with multiple medical problems not limited to very severe COPD, multiple compression fractures.  Her GI hx is pertinent for cirrhosis, diverticulosis and colon polyps.   She had a colonoscopy Oct 2015 remarkable for diverticulosis and a 45 mm pedunculated polyp which was removed piecemeal fashion.    Path c/w tubulovillous adenoma but no follow-up colonoscopy recommended due to comorbidities.  Patient is referred by Dr. Coralyn Mark for anemia. Hgb 11.6, down from baseline of 13.1, MCV 105. Ferritin 254. Patient says she just completed stool cards which were negative. She hasn't seen any blood in stool or black stools. She had been taking 400-600 mg of motrin daily until a couple of weeks ago. No abdominal pain or N/V. Weight down ~ 4 pounds since mouth surgery but overall stable. Previous imaging study raised concern for cirrhosis. Patient has drank alcohol for years. Typically has a 2-4 drinks a day.     Past Medical History:  Diagnosis Date  . Alcohol abuse   . Arthritis   . Ascites   . Atherosclerosis of aorta ([place])   . Cholelithiasis   . Cirrhosis ([place])   . Complication of anesthesia    Difficult time waking up after having biospy with colonoscopy  . Compression fracture of lumbar vertebra (HCC)     L1 and L2  . Emphysema of lung ([place])   . History of multiple myeloma    smoldering  . Hyponatremia   . Macrocytic anemia   . 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 Osteoporosis   . Pneumonia 04/2014  . Tobacco abuse   . Tubular adenoma   . Vertebral compression fracture [facility])      Past Surgical History:  Procedure Laterality Date  . COLONOSCOPY N/A 05/24/2014   Procedure: COLONOSCOPY;  Surgeon:  Gatha Mayer, MD;  Location: [facility];  Service: Endoscopy;  Laterality: N/A;  MAC if possible ultraslim colonoscope and gastroscope needed  . FLEXIBLE SIGMOIDOSCOPY N/A 05/21/2014   Procedure: FLEXIBLE SIGMOIDOSCOPY;  Surgeon: Gatha Mayer, MD;  [facility];  Service: Endoscopy;  Laterality: N/A;  . KYPHOPLASTY N/A 12/12/2014   Procedure: KYPHOPLASTY;  Surgeon: Phylliss Bob, MD;  Location: [facility];  Service: Orthopedics;  Laterality: N/A;  T10 kyphoplasty  . MANDIBLE FRACTURE SURGERY     X 2  . TONSILLECTOMY     Family History  Problem Relation Age of Onset  . Heart  disease Mother   . Other Father        brain tumor  . COPD Sister   . ALS Brother   . Colon cancer Neg Hx   . Breast cancer Neg Hx    Social History   Tobacco Use  . Smoking status: Former Smoker    Packs/day: 0.25    Years: 46.00    Pack years: 11.50    Types: Cigarettes    Last attempt to quit: 02/24/2017    Years since quitting: 0.8  . Smokeless tobacco: Never Used  Substance Use Topics  . Alcohol use: Yes    Alcohol/week: 0.0 oz    Comment: 1-4 alcoholic beverages daily  . Drug use: No   Current Outpatient Medications  Medication Sig Dispense Refill  . albuterol (PROVENTIL HFA;VENTOLIN HFA) 108 (90 Base) MCG/ACT inhaler INHALE 2 PUFFS INTO THE LUNGS EVERY 6 HOURS AS NEEDED FOR WHEEZING OR SHORTNESS OF BREATH 18 g 2  . amLODipine (NORVASC) 2.5 MG tablet Take 2.5 mg by mouth daily.    Jearl Klinefelter ELLIPTA 62.5-25 MCG/INH AEPB INHALE 1 PUFF INTO THE LUNGS DAILY 60 each 11  . calcitonin, salmon, (MIACALCIN/FORTICAL) 200 UNIT/ACT nasal spray U 1 SPRAY NASALLY D. ALTERNATING NOSTRILS  3  . Calcium-Magnesium-Vitamin D (CALCIUM 500 PO) Take 1 tablet by mouth daily.    Marland Kitchen conjugated estrogens (PREMARIN) vaginal cream Place 1 Applicatorful vaginally as directed. Only for GYN appointments    . cyclobenzaprine (FLEXERIL) 5 MG tablet Take 5-10 mg by mouth at bedtime as needed for muscle spasms.     Marland Kitchen ibuprofen (ADVIL,MOTRIN) 800 MG tablet Take 800 mg by mouth every 8 (eight) hours as needed.    . lidocaine (LIDODERM) 5 % Place 1-2 patches onto the skin daily as needed (severe pain). Remove & Discard patch within 12 hours or as directed by MD    . Menthol, Topical Analgesic, (BIOFREEZE EX) Apply topically as directed.    . Multiple Vitamins-Minerals (MULTIVITAMIN WITH MINERALS) tablet Take 1 tablet by mouth daily.     No current facility-administered medications for this visit.    Allergies  Allergen Reactions  . Benadryl [Diphenhydramine]     "climbs the walls"  .  Hydrocodone-Acetaminophen     "flips me out"  . Oxycodone Other (See Comments)    Anxiety, restless  . Tape     Skin sensitive to tape  . Valium [Diazepam] Other (See Comments)    Pt states makes her crazy     Review of Systems: Positive for arthritis, back pain and cough. All other systems reviewed and negative except where noted in HPI.   Creatinine clearance cannot be calculated ([name] most recent lab result is older than the maximum 21 days allowed.)   Physical Exam:    Wt Readings from Last 3 Encounters:  01/06/18 81 lb (36.7 kg)  09/27/17 86  lb (39 kg)  09/17/17 77 lb (34.9 kg)    BP (!) 98/48   Pulse 72   Ht 4' 11"  (1.499 m)   Wt 81 lb (36.7 kg)   BMI 16.36 kg/m   Constitutional:  Pleasant frail, chronically ill appearing female in no acute distress. Psychiatric: Normal mood and affect. Behavior is normal. EENT: Pupils normal.  Conjunctivae are normal. No scleral icterus. Neck supple.  Cardiovascular: Normal rate, regular rhythm. No edema Pulmonary/chest: Effort normal and breath sounds normal. No wheezing, rales or rhonchi. Abdominal: Soft, nondistended, nontender. Bowel sounds active throughout. There are no masses palpable. No hepatomegaly. Neurological: Alert and oriented to person place and time. Skin: Skin is warm and dry. No rashes noted.  Tye Savoy, NP  01/06/2018, 2:54 PM  Cc: Coralyn Mark Altamese Cabal, *

## 2018-01-10 ENCOUNTER — Encounter: Payer: Self-pay | Admitting: Nurse Practitioner

## 2018-01-25 ENCOUNTER — Other Ambulatory Visit: Payer: Self-pay

## 2018-01-25 DIAGNOSIS — D539 Nutritional anemia, unspecified: Secondary | ICD-10-CM

## 2018-01-25 NOTE — Progress Notes (Signed)
Agree with f/u labs - bone marrow suppression likely  Chart reviewed and I do think she is ok for an EGD - should consider variceal screening but not urgent and can discuss  Not being given colonoscopy surveillance due to co-morbidities but not specifically due to sedation risk  As best I can tell not on O2  If you have not done so would get complete Abd [place] re: cirrhosis

## 2018-01-26 ENCOUNTER — Telehealth: Payer: Self-pay | Admitting: Nurse Practitioner

## 2018-01-26 NOTE — Telephone Encounter (Signed)
Pt stated that she was speaking with Harlon Ditty and got disconnected. She mentioned that it was regarding scheduling a test that [place] ordered. Pls call pt.

## 2018-01-28 NOTE — Progress Notes (Signed)
Marland Kitchen    HEMATOLOGY/ONCOLOGY CLINIC NOTE  Date of Service: 02/01/18     Patient Care Team: Lanice Shirts, MD as PCP - General (Internal Medicine) Jeralene Peters, DDS as Dietitian (Periodontics)  Orthopedics - Dr. Layne Benton from [facility]   CHIEF COMPLAINTS/PURPOSE OF CONSULTATION:   F/u for smoldering multiple myeloma  HISTORY OF PRESENTING ILLNESS:   Cassidy Barrett is a wonderful 72 y.o. female who has been referred to [place] by Dr Wandra Feinstein for evaluation and management of possible plasma cell dyscrasia.  Patient has a history of liver cirrhosis with ascites, alcohol abuse, emphysema, macrocytic anemia, osteoporosis and was seen by orthopedics for spinal vertebral compression fractures in the setting of osteoporosis.  She was noted to have multiple significant lab abnormalities including an elevated alkaline phosphatase and high total protein level.  Chronic appealing L1-L2 compression fractures  Patient subsequently had a bone scan to evaluate these compression fractures on 09/26/2015.  This showed mild increased activity within the superior endplates of L3, L4 and L5 likely representing subacute fractures. Increased activity within the T2 vertebral body compatible with known compression fracture. No evidence of suggest bony metastatic disease.  As a part of her workup she also had an SPEP done which shows 3 monoclonal protein bands for a total M protein of 2.3 g/dL. On IFE this is noted to be monoclonal IgA kappa protein. Her sedimentation rate was also elevated at 73.  She was referred to [place] for further evaluation to rule out multiple myeloma. Patient notes her mid and lower back pain related to her compression fractures. Also notes some left hip pain. Her labs from January 2017 did not show any anemia, hypercalcemia or renal failure.  She notes that she did have a colonoscopy in 2015 which demonstrated a 4.5 cm rectal polyp which was removed  piecemeal. Pathology showed a tubulovillous adenoma. Patient was also noted to have significant diverticulosis and was not recommended routine colonoscopies. Given her CT scan showing concern of rectal mass she was referred back to Dr. Henrene Pastor at [facility] for consideration of a sigmoidoscopy/colonoscopy. Patient notes no overt rectal bleeding. No obstructive symptoms. No change in bowel habits. No new abdominal pain. She also needs to connect with gi for management of her liver cirrhosis and related issues.   INTERVAL HISTORY:  Cassidy Barrett is here for her scheduled followup for smoldering myeloma. The patient's last visit with [place] was on 09/27/17. She is accompanied today by her daughter. The pt reports that she is doing well overall.   The pt reports that she recently saw her GI Dr Leroy Kennedy, for anemia. She will be having an endoscopy and US Abdomen with GI next week. She is switching to Protonix from Prilosec. She denies any concern for bleeding, and denying blood in the stools.  She notes that she has not been eating well, as she just does not want food, eating less than half of what she used to. She also sites dental factors for limiting her food intake and adds that she has feelings of fullness in her abdomen but denies abdominal pain. She adds that her feet/ankles have begun swelling.  She is continuing to take Calcitonin spray. She is continuing to be followed by Dr Hal Morales.   She notes that her urine has begun looking orange, and after taking Peptobismol her stools have become dark.   She notes that she has bee having about 2 beers each day and 2-3 glasses of wine.  I counseled her towards complete alcohol cessation.   Lab results today (02/01/18) of CBC w/diff, CMP, and Reticulocytes is as follows: all values are WNL except for RBC at 2.76, HGB at 10.0, HCT at 29.4, MCV at 106.5, MCH at 36.2, RDW at 15.6, ANC at 7.1k, Monocytes at 1.0k, Sodium at 129, Chloride at 91, Glucose at 139, BUN at 3,  Calcium at 7.8, Albumin at 1.5, AST at 106, Alk Phos at 450, Total Bilirubin at 3.5, Retic ct pct at 6.8%, Retic ct abs at 187.7. SPEP 02/01/18 is pending Kappa/Lambda 02/01/18 is pending  On review of systems, pt reports not eating well, lack of appetite, foot/ankle swelling, feelings of fullness, and denies concerns for bleeding, blood in the stools, new back pains, and any other symptoms.   MEDICAL HISTORY:  Past Medical History:  Diagnosis Date  . Alcohol abuse   . Arthritis   . Ascites   . Atherosclerosis of aorta ([place])   . Cholelithiasis   . Cirrhosis ([facility])   . Complication of anesthesia    Difficult time waking up after having biospy with colonoscopy  . Compression fracture of lumbar vertebra ([facility])     L1 and L2  . Emphysema of lung ([place])   . History of multiple myeloma    smoldering  . Hyponatremia   . Macrocytic anemia   . 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 Osteoporosis   . Pneumonia 04/2014  . Tobacco abuse   . Tubular adenoma   . Vertebral compression fracture ([facility])    ?? 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  SURGICAL HISTORY: Past Surgical History:  Procedure Laterality Date  . COLONOSCOPY N/A 05/24/2014   Procedure: COLONOSCOPY;  Surgeon: Gatha Mayer, MD;  Location: [facility];  Service: Endoscopy;  Laterality: N/A;  MAC if possible ultraslim colonoscope and gastroscope needed  . FLEXIBLE SIGMOIDOSCOPY N/A 05/21/2014   Procedure: FLEXIBLE SIGMOIDOSCOPY;  Surgeon: Gatha Mayer, MD;  Location: [facility];  Service: Endoscopy;  Laterality: N/A;  . KYPHOPLASTY N/A 12/12/2014   Procedure: KYPHOPLASTY;  Surgeon: Phylliss Bob, MD;  Location: [facility];  Service: Orthopedics;  Laterality: N/A;  T10 kyphoplasty  . MANDIBLE FRACTURE SURGERY     X 2  . TONSILLECTOMY      SOCIAL HISTORY: Social History   Socioeconomic History  . Marital status: Married    Spouse name: Not on file  . Number of children: Not on file  . Years of  education: Not on file  . Highest education level: Not on file  Occupational History  . Occupation: retired -[employer]  Social Needs  . Financial resource strain: Not on file  . Food insecurity:    Worry: Not on file    Inability: Not on file  . Transportation needs:    Medical: Not on file    Non-medical: Not on file  Tobacco Use  . Smoking status: Former Smoker    Packs/day: 0.25    Years: 46.00    Pack years: 11.50    Types: Cigarettes    Last attempt to quit: 02/24/2017    Years since quitting: 0.9  . Smokeless tobacco: Never Used  Substance and Sexual Activity  . Alcohol use: Yes    Alcohol/week: 0.0 oz    Comment: 1-4 alcoholic beverages daily  . Drug use: No  . Sexual activity: Not on file  Lifestyle  . Physical activity:    Days per week: Not on file    Minutes  per session: Not on file  . Stress: Not on file  Relationships  . Social connections:    Talks on phone: Not on file    Gets together: Not on file    Attends religious service: Not on file    Active member of club or organization: Not on file    Attends meetings of clubs or organizations: Not on file    Relationship status: Not on file  . Intimate partner violence:    Fear of current or ex partner: Not on file    Emotionally abused: Not on file    Physically abused: Not on file    Forced sexual activity: Not on file  Other Topics Concern  . Not on file  Social History Narrative  . Not on file   Patient notes that she is drinking about 1 beer and 1 hard liquor every day - was counseled regarding absolute alcohol abstinence.  FAMILY HISTORY:  No family history of colon cancer.  ALLERGIES:  is allergic to benadryl [diphenhydramine]; hydrocodone-acetaminophen; oxycodone; tape; and valium [diazepam].  MEDICATIONS:  Current Outpatient Medications  Medication Sig Dispense Refill  . albuterol (PROVENTIL HFA;VENTOLIN HFA) 108 (90 Base) MCG/ACT inhaler INHALE 2 PUFFS INTO THE LUNGS EVERY 6 HOURS  AS NEEDED FOR WHEEZING OR SHORTNESS OF BREATH 18 g 2  . amLODipine (NORVASC) 2.5 MG tablet Take 2.5 mg by mouth daily.    Jearl Klinefelter ELLIPTA 62.5-25 MCG/INH AEPB INHALE 1 PUFF INTO THE LUNGS DAILY 60 each 11  . calcitonin, salmon, (MIACALCIN/FORTICAL) 200 UNIT/ACT nasal spray U 1 SPRAY NASALLY D. ALTERNATING NOSTRILS  3  . Calcium-Magnesium-Vitamin D (CALCIUM 500 PO) Take 1 tablet by mouth daily.    Marland Kitchen conjugated estrogens (PREMARIN) vaginal cream Place 1 Applicatorful vaginally as directed. Only for GYN appointments    . cyclobenzaprine (FLEXERIL) 5 MG tablet Take 5-10 mg by mouth at bedtime as needed for muscle spasms.     Marland Kitchen ibuprofen (ADVIL,MOTRIN) 800 MG tablet Take 800 mg by mouth every 8 (eight) hours as needed.    . lidocaine (LIDODERM) 5 % Place 1-2 patches onto the skin daily as needed (severe pain). Remove & Discard patch within 12 hours or as directed by MD    . Menthol, Topical Analgesic, (BIOFREEZE EX) Apply topically as directed.    . Multiple Vitamins-Minerals (MULTIVITAMIN WITH MINERALS) tablet Take 1 tablet by mouth daily.    . pantoprazole (PROTONIX) 40 MG tablet Take 1 tablet (40 mg total) by mouth daily. 90 tablet 1   No current facility-administered medications for this visit.     REVIEW OF SYSTEMS:    A 10+ POINT REVIEW OF SYSTEMS WAS OBTAINED including neurology, dermatology, psychiatry, cardiac, respiratory, lymph, extremities, GI, GU, Musculoskeletal, constitutional, breasts, reproductive, HEENT.  All pertinent positives are noted in the HPI.  All others are negative.   PHYSICAL EXAMINATION: ECOG PERFORMANCE STATUS: 2 - Symptomatic, <50% confined to bed  Vitals:   02/01/18 1402  BP: 116/79  Pulse: (!) 135  Resp: 18  Temp: 98 F (36.7 C)  SpO2: 95%   Filed Weights   02/01/18 1402  Weight: 83 lb 4.8 oz (37.8 kg)   .Body mass index is 16.82 kg/m.   GENERAL:alert, in no acute distress and comfortable SKIN: no acute rashes, no significant lesions EYES:  conjunctiva are pink and non-injected, sclera anicteric OROPHARYNX: MMM, no exudates, no oropharyngeal erythema or ulceration NECK: supple, no JVD LYMPH:  no palpable lymphadenopathy in the cervical, axillary or inguinal regions  LUNGS: clear to auscultation b/l with normal respiratory effort HEART: regular rate & rhythm ABDOMEN:  normoactive bowel sounds , non tender, not distended. No palpable hepatosplenomegaly.  Extremity: 1+ pedal edema PSYCH: alert & oriented x 3 with fluent speech NEURO: no focal motor/sensory deficits   LABORATORY DATA:  I have reviewed the data as listed  . CBC Latest Ref Rng & Units 02/01/2018 01/06/2018 09/27/2017  WBC 3.9 - 10.3 K/uL 9.7 9.4 7.1  Hemoglobin 11.6 - 15.9 g/dL 10.0(L) 10.7(L) 14.1  Hematocrit 34.8 - 46.6 % 29.4(L) 31.1(L) 41.3  Platelets 145 - 400 K/uL 277 318.0 252    . CMP Latest Ref Rng & Units 02/01/2018 09/27/2017 08/13/2017  Glucose 70 - 99 mg/dL 139(H) 115 151(H)  BUN 8 - 23 mg/dL 3(L) 6(L) 7  Creatinine 0.44 - 1.00 mg/dL 0.64 0.61 0.50  Sodium 135 - 145 mmol/L 129(L) 134(L) 131(L)  Potassium 3.5 - 5.1 mmol/L 3.5 3.9 3.6  Chloride 98 - 111 mmol/L 91(L) 96(L) 98(L)  CO2 22 - 32 mmol/L _0 Calcium 8.9 - 10.3 mg/dL 7.8(L) 8.9 9.0  Total Protein 6.5 - 8.1 g/dL 8.1 7.8 -  Total Bilirubin 0.3 - 1.2 mg/dL 3.5(HH) 2.1(H) -  Alkaline Phos 38 - 126 U/L 450(H) 709(H) -  AST 15 - 41 U/L 106(H) 82(H) -  ALT 0 - 44 U/L 26 34 -          RADIOGRAPHIC STUDIES: I have personally reviewed the radiological images as listed and agreed with the findings in the report. No results found.   ASSESSMENT & PLAN:   72 year old Caucasian female with severe osteoporosis and vertebral compression fractures  #1 IgA kappa Smoldering Multiple myeloma    M protein level increased significantly to 3 ( 1.5-->1.8 ---> 1.9-->1.8-->1.7--> 3) Noted to have new anemia with a hgb of 10 Labs with no signs of  hypercalcemia or renal failure. No overt focal new  bone pains. Bm Bx shows 21% abnormal plasma cells. Rpt Bone Survey on 05/12/2016 -- showed no evidence of lytic lesions. Plan -will hold off on definitive treatments for multiple myeloma unless there is any overt evidence of end organ injury meeting criteria for multiple myeloma. -If her M protein continues to climb from the tests that are pending today, we may need to repeat full body scan. -Smolder myeloma as a risk factor for her osteoporosis.   -Discussed pt labwork today, 02/01/18; HGB at 10.0, Sodium at 129, Total Bilirubin at 3.5, AST at 106 -SPEP shows increase in M spike to 3  -Protein malnutrition observed- encouraged pt to begin eating more as best as she is able to.  -Worsening liver functions could be driving anemia -Concern for liver - Follow up with GI next week for endoscopy and US Abdomen.  -Counseled pt for complete alcohol cessation -Will see pt back in 3 months unless new concerns develop before then   #2 liver cirrhosis with known varices. Increased bilirubin levels. Plan -Counseled on absolute alcohol cessation. -continue f/u with PCP  #3 osteoporosis with vertebral compression fractures.   #4 ?? history of osteonecrosis of the jaw with Fosamax/bisphosphonates previously . Unclear if this truly represents ONJ ---patient decribes it as "having brittle teeth" Plan  -Patient has been following with orthopedics Dr. Wandra Feinstein to manage her osteoporosis . -continue aggressive vitamin D replacement . -Pt has had full dental extraction and is recovering well.  -mx per orthopedics.   RTC with Dr Irene Limbo in 1 month with labs  and Bone survey  All of the patients and her female partners questions were answered to their apparent satisfaction. The patient knows to call the clinic with any problems, questions or concerns.  The total time spent in the appt was 30 minutes and more than 50% was on counseling and direct patient cares.    Sullivan Lone MD [place] [facility]IV[facility]  [facility] Hematology/Oncology Physician [facility]  (Office):       [phone] (Work cell):  [phone] (Fax):           [phone]  I, Baldwin Jamaica, am acting as a scribe for Dr Irene Limbo.   .I have reviewed the above documentation for accuracy and completeness, and I agree with the above. Brunetta Genera MD

## 2018-01-31 NOTE — Telephone Encounter (Signed)
Patient calling back requesting to speak to [facility]. Best # [phone]

## 2018-02-01 ENCOUNTER — Inpatient Hospital Stay (HOSPITAL_BASED_OUTPATIENT_CLINIC_OR_DEPARTMENT_OTHER): Payer: Medicare Other | Admitting: Hematology

## 2018-02-01 ENCOUNTER — Other Ambulatory Visit: Payer: Self-pay | Admitting: *Deleted

## 2018-02-01 ENCOUNTER — Inpatient Hospital Stay: Payer: Medicare Other | Attending: Hematology

## 2018-02-01 ENCOUNTER — Telehealth: Payer: Self-pay

## 2018-02-01 VITALS — BP 116/79 | HR 135 | Temp 98.0°F | Resp 18 | Ht 59.0 in | Wt 83.3 lb

## 2018-02-01 DIAGNOSIS — E46 Unspecified protein-calorie malnutrition: Secondary | ICD-10-CM

## 2018-02-01 DIAGNOSIS — D649 Anemia, unspecified: Secondary | ICD-10-CM

## 2018-02-01 DIAGNOSIS — M8088XS Other osteoporosis with current pathological fracture, vertebra(e), sequela: Secondary | ICD-10-CM | POA: Insufficient documentation

## 2018-02-01 DIAGNOSIS — X58XXXS Exposure to other specified factors, sequela: Secondary | ICD-10-CM | POA: Insufficient documentation

## 2018-02-01 DIAGNOSIS — J439 Emphysema, unspecified: Secondary | ICD-10-CM | POA: Insufficient documentation

## 2018-02-01 DIAGNOSIS — D472 Monoclonal gammopathy: Secondary | ICD-10-CM

## 2018-02-01 DIAGNOSIS — E785 Hyperlipidemia, unspecified: Secondary | ICD-10-CM | POA: Diagnosis not present

## 2018-02-01 DIAGNOSIS — M818 Other osteoporosis without current pathological fracture: Secondary | ICD-10-CM

## 2018-02-01 DIAGNOSIS — K7031 Alcoholic cirrhosis of liver with ascites: Secondary | ICD-10-CM | POA: Diagnosis not present

## 2018-02-01 DIAGNOSIS — Z87891 Personal history of nicotine dependence: Secondary | ICD-10-CM | POA: Diagnosis not present

## 2018-02-01 DIAGNOSIS — Z79899 Other long term (current) drug therapy: Secondary | ICD-10-CM | POA: Diagnosis not present

## 2018-02-01 DIAGNOSIS — M1990 Unspecified osteoarthritis, unspecified site: Secondary | ICD-10-CM | POA: Insufficient documentation

## 2018-02-01 DIAGNOSIS — C9000 Multiple myeloma not having achieved remission: Secondary | ICD-10-CM

## 2018-02-01 DIAGNOSIS — K7030 Alcoholic cirrhosis of liver without ascites: Secondary | ICD-10-CM

## 2018-02-01 LAB — CMP (CANCER CENTER ONLY)
ALK PHOS: 450 U/L — AB (ref 38–126)
ALT: 26 U/L (ref 0–44)
ANION GAP: 9 (ref 5–15)
AST: 106 U/L — ABNORMAL HIGH (ref 15–41)
Albumin: 1.5 g/dL — ABNORMAL LOW (ref 3.5–5.0)
BILIRUBIN TOTAL: 3.5 mg/dL — AB (ref 0.3–1.2)
BUN: 3 mg/dL — ABNORMAL LOW (ref 8–23)
CHLORIDE: 91 mmol/L — AB (ref 98–111)
CO2: 29 mmol/L (ref 22–32)
Calcium: 7.8 mg/dL — ABNORMAL LOW (ref 8.9–10.3)
Creatinine: 0.64 mg/dL (ref 0.44–1.00)
Glucose, Bld: 139 mg/dL — ABNORMAL HIGH (ref 70–99)
POTASSIUM: 3.5 mmol/L (ref 3.5–5.1)
SODIUM: 129 mmol/L — AB (ref 135–145)
Total Protein: 8.1 g/dL (ref 6.5–8.1)

## 2018-02-01 LAB — CBC WITH DIFFERENTIAL (CANCER CENTER ONLY)
BASOS ABS: 0.1 10*3/uL (ref 0.0–0.1)
BASOS PCT: 1 %
EOS ABS: 0.2 10*3/uL (ref 0.0–0.5)
Eosinophils Relative: 2 %
HEMATOCRIT: 29.4 % — AB (ref 34.8–46.6)
HEMOGLOBIN: 10 g/dL — AB (ref 11.6–15.9)
Lymphocytes Relative: 14 %
Lymphs Abs: 1.3 10*3/uL (ref 0.9–3.3)
MCH: 36.2 pg — ABNORMAL HIGH (ref 25.1–34.0)
MCHC: 34 g/dL (ref 31.5–36.0)
MCV: 106.5 fL — ABNORMAL HIGH (ref 79.5–101.0)
Monocytes Absolute: 1 10*3/uL — ABNORMAL HIGH (ref 0.1–0.9)
Monocytes Relative: 11 %
NEUTROS ABS: 7.1 10*3/uL — AB (ref 1.5–6.5)
NEUTROS PCT: 72 %
Platelet Count: 277 10*3/uL (ref 145–400)
RBC: 2.76 MIL/uL — ABNORMAL LOW (ref 3.70–5.45)
RDW: 15.6 % — AB (ref 11.2–14.5)
WBC Count: 9.7 10*3/uL (ref 3.9–10.3)

## 2018-02-01 LAB — RETICULOCYTES
RBC.: 2.76 MIL/uL — AB (ref 3.70–5.45)
RETIC COUNT ABSOLUTE: 187.7 10*3/uL — AB (ref 33.7–90.7)
RETIC CT PCT: 6.8 % — AB (ref 0.7–2.1)

## 2018-02-01 MED ORDER — PANTOPRAZOLE SODIUM 40 MG PO TBEC: 40.0000 mg | DELAYED_RELEASE_TABLET | Freq: Every day | ORAL | 1 refills | Status: DC

## 2018-02-01 NOTE — Telephone Encounter (Signed)
Left a message for patient to call back. 

## 2018-02-01 NOTE — Telephone Encounter (Signed)
Spoke with patient and gave her Paula's recommendations. Patient needs [place] after 02/10/18 Lab for AFP in Epic. Scheduled Abdominal US on 7/19 at 9:00 AM [facility]. NPO after midnight. Scheduled f/u OV with  Tye Savoy, NP on 02/21/18 at 3 PM. Left a message for patient to call back to get appointments and instructions.

## 2018-02-01 NOTE — Telephone Encounter (Signed)
Received a call from the lab. Total bilirubin=3.5. Dr. Irene Limbo made aware and reviewed lab results.

## 2018-02-02 LAB — MULTIPLE MYELOMA PANEL, SERUM
ALPHA2 GLOB SERPL ELPH-MCNC: 0.4 g/dL (ref 0.4–1.0)
Albumin SerPl Elph-Mcnc: 2.1 g/dL — ABNORMAL LOW (ref 2.9–4.4)
Albumin/Glob SerPl: 0.4 — ABNORMAL LOW (ref 0.7–1.7)
Alpha 1: 0.2 g/dL (ref 0.0–0.4)
B-Globulin SerPl Elph-Mcnc: 3.8 g/dL — ABNORMAL HIGH (ref 0.7–1.3)
Gamma Glob SerPl Elph-Mcnc: 1.2 g/dL (ref 0.4–1.8)
Globulin, Total: 5.6 g/dL — ABNORMAL HIGH (ref 2.2–3.9)
IGM (IMMUNOGLOBULIN M), SRM: 212 mg/dL (ref 26–217)
IgA: 4757 mg/dL — ABNORMAL HIGH (ref 87–352)
IgG (Immunoglobin G), Serum: 996 mg/dL (ref 700–1600)
M Protein SerPl Elph-Mcnc: 3 g/dL — ABNORMAL HIGH
TOTAL PROTEIN ELP: 7.7 g/dL (ref 6.0–8.5)

## 2018-02-02 LAB — KAPPA/LAMBDA LIGHT CHAINS
KAPPA FREE LGHT CHN: 79.1 mg/L — AB (ref 3.3–19.4)
Kappa, lambda light chain ratio: 4.32 — ABNORMAL HIGH (ref 0.26–1.65)
LAMDA FREE LIGHT CHAINS: 18.3 mg/L (ref 5.7–26.3)

## 2018-02-02 NOTE — Telephone Encounter (Signed)
Patient given appointments and instructions.

## 2018-02-03 ENCOUNTER — Telehealth: Payer: Self-pay | Admitting: Nurse Practitioner

## 2018-02-03 NOTE — Telephone Encounter (Signed)
She should not be taking ibuprofen or Aleve  Since she is still drinking acetaminophen on a regular basis is not good for her  So she needs to ask PCP about what to take for headaches

## 2018-02-04 NOTE — Telephone Encounter (Signed)
Patient was given this advice and will contact her PCP regarding her headaches.

## 2018-02-08 ENCOUNTER — Encounter: Payer: Self-pay | Admitting: Nurse Practitioner

## 2018-02-08 ENCOUNTER — Telehealth: Payer: Self-pay | Admitting: Nurse Practitioner

## 2018-02-08 NOTE — Telephone Encounter (Signed)
Cassidy Barrett, I spoke with her wife [name] who reports that she is having bilateral pitting edema from just above he ankles down to her toes, no weeping.  The edema is less when she elevates her feet but returns when she brings her feet down.  They are asking for a diuretic, low dose and short term.  Please advise.  Thank you.

## 2018-02-08 NOTE — Telephone Encounter (Signed)
Is her abdomen swelling?  I can start her on Lasix 20 mg daily and Aldactone 50 mg daily but she will need a basic metabolic profile in 1 week .  Please give her only a month supply without refills.  Make sure she knows about her ultrasound appointment coming up.  And she needs to follow a 2 g sodium diet.  Please explained that to her if she does not understand.  She should be weighing herself every day same time, same scale, first thing in the morning without close.

## 2018-02-09 ENCOUNTER — Other Ambulatory Visit: Payer: Self-pay | Admitting: Nurse Practitioner

## 2018-02-09 ENCOUNTER — Encounter: Payer: Self-pay | Admitting: Nurse Practitioner

## 2018-02-09 ENCOUNTER — Other Ambulatory Visit: Payer: Self-pay

## 2018-02-09 DIAGNOSIS — K7030 Alcoholic cirrhosis of liver without ascites: Secondary | ICD-10-CM

## 2018-02-09 MED ORDER — SPIRONOLACTONE 50 MG PO TABS: 50.0000 mg | ORAL_TABLET | Freq: Every day | ORAL | 0 refills | Status: DC

## 2018-02-09 MED ORDER — FUROSEMIDE 20 MG PO TABS: 20.0000 mg | ORAL_TABLET | Freq: Every day | ORAL | 0 refills | Status: DC

## 2018-02-09 NOTE — Telephone Encounter (Signed)
I spoke with [name] again this morning and gave her all of your instructions.  New medications were sent to her pharmacy.  [name] does relate that Cassidy Barrett's stomach is "hard" and her urine is "dark".  [name] has not being weighing herself daily but will start.  Ultrasound is this Friday and follow-up with you next week.  I did explain the 2 gm sodium diet and also sent written information.

## 2018-02-10 ENCOUNTER — Other Ambulatory Visit: Payer: Self-pay | Admitting: Internal Medicine

## 2018-02-11 ENCOUNTER — Ambulatory Visit (HOSPITAL_COMMUNITY)
Admission: RE | Admit: 2018-02-11 | Discharge: 2018-02-11 | Disposition: A | Discharge status: Home / Self Care | Payer: Medicare Other | Source: Ambulatory Visit | Attending: Nurse Practitioner | Admitting: Nurse Practitioner

## 2018-02-11 ENCOUNTER — Telehealth: Payer: Self-pay | Admitting: Hematology

## 2018-02-11 DIAGNOSIS — K828 Other specified diseases of gallbladder: Secondary | ICD-10-CM | POA: Diagnosis not present

## 2018-02-11 DIAGNOSIS — K8020 Calculus of gallbladder without cholecystitis without obstruction: Secondary | ICD-10-CM | POA: Insufficient documentation

## 2018-02-11 DIAGNOSIS — J90 Pleural effusion, not elsewhere classified: Secondary | ICD-10-CM | POA: Diagnosis not present

## 2018-02-11 DIAGNOSIS — K7030 Alcoholic cirrhosis of liver without ascites: Secondary | ICD-10-CM | POA: Diagnosis not present

## 2018-02-11 NOTE — Telephone Encounter (Signed)
Scheduled lab appt per 7/15 sch message - pt wife is aware of appt date and time.

## 2018-02-14 ENCOUNTER — Encounter: Payer: Self-pay | Admitting: Nurse Practitioner

## 2018-02-15 ENCOUNTER — Ambulatory Visit: Payer: Self-pay | Admitting: Hematology and Oncology

## 2018-02-18 ENCOUNTER — Other Ambulatory Visit (INDEPENDENT_AMBULATORY_CARE_PROVIDER_SITE_OTHER): Payer: Medicare Other

## 2018-02-18 ENCOUNTER — Encounter (HOSPITAL_COMMUNITY): Payer: Self-pay | Admitting: Emergency Medicine

## 2018-02-18 ENCOUNTER — Telehealth: Payer: Self-pay | Admitting: Gastroenterology

## 2018-02-18 ENCOUNTER — Other Ambulatory Visit: Payer: Self-pay

## 2018-02-18 ENCOUNTER — Emergency Department (HOSPITAL_COMMUNITY)
Admission: EM | Admit: 2018-02-18 | Discharge: 2018-02-18 | Disposition: A | Discharge status: Home / Self Care | Payer: Medicare Other | Attending: Emergency Medicine | Admitting: Emergency Medicine

## 2018-02-18 DIAGNOSIS — D539 Nutritional anemia, unspecified: Secondary | ICD-10-CM

## 2018-02-18 DIAGNOSIS — K7030 Alcoholic cirrhosis of liver without ascites: Secondary | ICD-10-CM

## 2018-02-18 DIAGNOSIS — K7460 Unspecified cirrhosis of liver: Secondary | ICD-10-CM | POA: Diagnosis not present

## 2018-02-18 DIAGNOSIS — Z87891 Personal history of nicotine dependence: Secondary | ICD-10-CM | POA: Insufficient documentation

## 2018-02-18 DIAGNOSIS — Z79899 Other long term (current) drug therapy: Secondary | ICD-10-CM | POA: Diagnosis not present

## 2018-02-18 DIAGNOSIS — E871 Hypo-osmolality and hyponatremia: Secondary | ICD-10-CM | POA: Insufficient documentation

## 2018-02-18 DIAGNOSIS — E876 Hypokalemia: Secondary | ICD-10-CM | POA: Diagnosis not present

## 2018-02-18 LAB — CBC WITH DIFFERENTIAL/PLATELET
BASOS ABS: 0 10*3/uL (ref 0.0–0.1)
BASOS PCT: 0.4 % (ref 0.0–3.0)
EOS ABS: 0.2 10*3/uL (ref 0.0–0.7)
Eosinophils Relative: 3.6 % (ref 0.0–5.0)
HCT: 29.3 % — ABNORMAL LOW (ref 36.0–46.0)
Hemoglobin: 9.7 g/dL — ABNORMAL LOW (ref 12.0–15.0)
Lymphocytes Relative: 25.6 % (ref 12.0–46.0)
Lymphs Abs: 1.4 10*3/uL (ref 0.7–4.0)
MCHC: 33.3 g/dL (ref 30.0–36.0)
MCV: 109 fl — ABNORMAL HIGH (ref 78.0–100.0)
MONO ABS: 0.8 10*3/uL (ref 0.1–1.0)
Monocytes Relative: 15 % — ABNORMAL HIGH (ref 3.0–12.0)
NEUTROS ABS: 3.1 10*3/uL (ref 1.4–7.7)
Neutrophils Relative %: 55.4 % (ref 43.0–77.0)
Platelets: 226 10*3/uL (ref 150.0–400.0)
RBC: 2.69 Mil/uL — ABNORMAL LOW (ref 3.87–5.11)
RDW: 16.1 % — AB (ref 11.5–15.5)
WBC: 5.6 10*3/uL (ref 4.0–10.5)

## 2018-02-18 LAB — VITAMIN B12: Vitamin B-12: 804 pg/mL (ref 211–911)

## 2018-02-18 LAB — BASIC METABOLIC PANEL
BUN: 7 mg/dL (ref 6–23)
CALCIUM: 7.2 mg/dL — AB (ref 8.4–10.5)
CO2: 30 mEq/L (ref 19–32)
CREATININE: 0.55 mg/dL (ref 0.40–1.20)
Chloride: 87 mEq/L — ABNORMAL LOW (ref 96–112)
GFR: 116.19 mL/min (ref >60.00)
Glucose, Bld: 144 mg/dL — ABNORMAL HIGH (ref 70–99)
Potassium: 2.6 mEq/L — CL (ref 3.5–5.1)
Sodium: 129 mEq/L — ABNORMAL LOW (ref 135–145)

## 2018-02-18 LAB — FERRITIN

## 2018-02-18 LAB — FOLATE: Folate: 3.5 ng/mL — ABNORMAL LOW (ref >5.9)

## 2018-02-18 LAB — MAGNESIUM: MAGNESIUM: 1.3 mg/dL — AB (ref 1.7–2.4)

## 2018-02-18 MED ORDER — POTASSIUM CHLORIDE CRYS ER 20 MEQ PO TBCR
40.0000 meq | EXTENDED_RELEASE_TABLET | Freq: Once | ORAL | Status: AC
  Administered 2018-02-18: 40 meq via ORAL
  Filled 2018-02-18: qty 2

## 2018-02-18 MED ORDER — POTASSIUM CHLORIDE 10 MEQ/100ML IV SOLN
10.0000 meq | Freq: Once | INTRAVENOUS | Status: AC
  Administered 2018-02-18: 10 meq via INTRAVENOUS
  Filled 2018-02-18: qty 100

## 2018-02-18 MED ORDER — SODIUM CHLORIDE 0.9 % IV SOLN
Freq: Once | INTRAVENOUS | Status: AC
Start: 2018-02-18 — End: 2018-02-18
  Administered 2018-02-18: 19:00:00 via INTRAVENOUS

## 2018-02-18 MED ORDER — POTASSIUM CHLORIDE CRYS ER 20 MEQ PO TBCR: 40.0000 meq | EXTENDED_RELEASE_TABLET | Freq: Every day | ORAL | 0 refills | Status: DC

## 2018-02-18 MED ORDER — MAGNESIUM OXIDE 400 (241.3 MG) MG PO TABS: 400.0000 mg | ORAL_TABLET | Freq: Every day | ORAL | 0 refills | Status: DC

## 2018-02-18 MED ORDER — MAGNESIUM SULFATE 2 GM/50ML IV SOLN
2.0000 g | Freq: Once | INTRAVENOUS | Status: AC
  Administered 2018-02-18: 2 g via INTRAVENOUS
  Filled 2018-02-18: qty 50

## 2018-02-18 MED ORDER — POTASSIUM CHLORIDE 10 MEQ/100ML IV SOLN: 10.0000 meq | Freq: Once | INTRAVENOUS | Status: DC

## 2018-02-18 NOTE — Telephone Encounter (Signed)
Patient has critical lab result of potassium 2.6 and low sodium. Brought to me as DOD this afternoon. Cirrhosis, oxygen dependent COPD.  Recently started on diuretics.  Please call patient now (as we just discussed) and send her to ED immediately.

## 2018-02-18 NOTE — ED Provider Notes (Signed)
[place] DEPT Provider Note   CSN: [id number] Arrival date & time: 02/18/18  1744     History   Chief Complaint Chief Complaint  Patient presents with  . Abnormal Lab    HPI Cassidy Barrett is a 72 y.o. female.  Pt presents to the ED today with low potassium and low sodium.  Pt has a hx of cirrhosis and was recently started on both lasix and spironolactone about 1 week ago.  She had labs done at her gi doctor's office which showed hypokalemia and hyponatremia.  Pt said she feels really good.  She has lost 10 lbs of fluid.     Past Medical History:  Diagnosis Date  . Alcohol abuse   . Arthritis   . Ascites   . Atherosclerosis of aorta ([place])   . Cholelithiasis   . Cirrhosis ([place])   . Complication of anesthesia    Difficult time waking up after having biospy with colonoscopy  . Compression fracture of lumbar vertebra (H[facility])     L1 and L2  . Emphysema of lung ([place])   . History of multiple myeloma    smoldering  . Hyponatremia   . Macrocytic anemia   . 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 Osteoporosis   . Pneumonia 04/2014  . Tobacco abuse   . Tubular adenoma   . Vertebral compression fracture [facility])     Patient Active Problem List   Diagnosis Date Noted  . 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 Osteoporosis 10/24/2015  . Vitamin D deficiency 10/24/2015  . Plasma cell dyscrasia 09/30/2015  . Rectal mass 09/30/2015  . Compression fracture 12/12/2014  . Pleural effusion 06/25/2014  . Rectal mass 05/21/2014  . Alcoholic cirrhosis ([place]) 69/67/8938  . Rectal polyp 05/21/2014  . Protein-calorie malnutrition, severe ([place]) 05/16/2014  . Pneumonia 05/14/2014  . Hyponatremia 05/14/2014  . Edema 05/14/2014  . Tobacco abuse 05/14/2014  . Alcohol abuse 05/14/2014  . Hypokalemia 05/14/2014  . COPD GOLD III 05/14/2014    Past Surgical History:  Procedure Laterality Date  . COLONOSCOPY N/A 05/24/2014   Procedure: COLONOSCOPY;   Surgeon: Gatha Mayer, MD;  Location: [facility];  Service: Endoscopy;  Laterality: N/A;  MAC if possible ultraslim colonoscope and gastroscope needed  . FLEXIBLE SIGMOIDOSCOPY N/A 05/21/2014   Procedure: FLEXIBLE SIGMOIDOSCOPY;  Surgeon: Gatha Mayer, MD;  Location: [facility];  Service: Endoscopy;  Laterality: N/A;  . KYPHOPLASTY N/A 12/12/2014   Procedure: KYPHOPLASTY;  Surgeon: Phylliss Bob, MD;  Location: [place];  Service: Orthopedics;  Laterality: N/A;  T10 kyphoplasty  . MANDIBLE FRACTURE SURGERY     X 2  . TONSILLECTOMY       OB History   None      Home Medications    Prior to Admission medications   Medication Sig Start Date End Date Taking? Authorizing Provider  albuterol (PROVENTIL HFA;VENTOLIN HFA) 108 (90 Base) MCG/ACT inhaler INHALE 2 PUFFS INTO THE LUNGS EVERY 6 HOURS AS NEEDED FOR WHEEZING OR SHORTNESS OF BREATH 02/10/18  Yes Tanda Rockers, MD  ANORO ELLIPTA 62.5-25 MCG/INH AEPB INHALE 1 PUFF INTO THE LUNGS DAILY 01/04/18  Yes Tanda Rockers, MD  calcitonin, salmon, (MIACALCIN/FORTICAL) 200 UNIT/ACT nasal spray U 1 SPRAY NASALLY D. ALTERNATING NOSTRILS 07/30/17  Yes [provider]  furosemide (LASIX) 20 MG tablet Take 1 tablet (20 mg total) by mouth daily. 02/09/18  Yes Willia Craze, NP  lidocaine (LIDODERM) 5 % Place 1-2 patches onto  the skin daily as needed (severe pain). Remove & Discard patch within 12 hours or as directed by MD   Yes [provider]  Menthol, Topical Analgesic, (BIOFREEZE EX) Apply topically as directed.   Yes [provider]  OVER THE COUNTER MEDICATION Apply 400 mg topically daily as needed (arthritis/back pain). CBD cream   Yes [provider]  pantoprazole (PROTONIX) 40 MG tablet Take 1 tablet (40 mg total) by mouth daily. 02/01/18  Yes Willia Craze, NP  spironolactone (ALDACTONE) 50 MG tablet Take 1 tablet (50 mg total) by mouth daily. 02/09/18  Yes Willia Craze, NP  ibuprofen (ADVIL,MOTRIN)  800 MG tablet Take 800 mg by mouth every 8 (eight) hours as needed.    [provider]  magnesium oxide (MAG-OX) 400 (241.3 Mg) MG tablet Take 1 tablet (400 mg total) by mouth daily. 02/18/18   Isla Pence, MD  potassium chloride SA (K-DUR,KLOR-CON) 20 MEQ tablet Take 2 tablets (40 mEq total) by mouth daily. 02/18/18   Isla Pence, MD    Family History Family History  Problem Relation Age of Onset  . Heart disease Mother   . Other Father        brain tumor  . COPD Sister   . ALS Brother   . Colon cancer Neg Hx   . Breast cancer Neg Hx     Social History Social History   Tobacco Use  . Smoking status: Former Smoker    Packs/day: 0.25    Years: 46.00    Pack years: 11.50    Types: Cigarettes    Last attempt to quit: 02/24/2017    Years since quitting: 0.9  . Smokeless tobacco: Never Used  Substance Use Topics  . Alcohol use: Yes    Alcohol/week: 0.0 oz    Comment: 1-4 alcoholic beverages daily  . Drug use: No     Allergies   Bee venom; Benadryl [diphenhydramine]; Hydrocodone-acetaminophen; Oxycodone; Tape; and Valium [diazepam]   Review of Systems Review of Systems  All other systems reviewed and are negative.    Physical Exam Updated Vital Signs BP (!) 103/57 (BP Location: Left Arm)   Pulse (!) 107   Temp 98.1 F (36.7 C) (Oral)   Resp 15   Ht _0  (1.499 m)   Wt 34 kg (75 lb)   SpO2 93%   BMI 15.15 kg/m   Physical Exam  Constitutional: She is oriented to person, place, and time. She appears well-developed and well-nourished.  HENT:  Head: Normocephalic and atraumatic.  Right Ear: External ear normal.  Left Ear: External ear normal.  Nose: Nose normal.  Mouth/Throat: Mucous membranes are dry.  Eyes: Pupils are equal, round, and reactive to light. Conjunctivae and EOM are normal.  Neck: Normal range of motion. Neck supple.  Cardiovascular: Regular rhythm, normal heart sounds and intact distal pulses. Tachycardia present.    Pulmonary/Chest: Effort normal and breath sounds normal.  Abdominal: Soft. Bowel sounds are normal.  Musculoskeletal: Normal range of motion.  Neurological: She is alert and oriented to person, place, and time.  Skin: Skin is dry. Capillary refill takes less than 2 seconds.  Psychiatric: She has a normal mood and affect. Her behavior is normal. Judgment and thought content normal.  Nursing note and vitals reviewed.    ED Treatments / Results  Labs (all labs ordered are listed, but only abnormal results are displayed) Labs Reviewed  MAGNESIUM - Abnormal; Notable for the following components:  Result Value   Magnesium 1.3 (*)    All other components within normal limits   13:09    Sodium 135 - 145 mEq/L 129Low    Potassium 3.5 - 5.1 mEq/L 2.6 Repeated and verified X2.Low Panic    Chloride 96 - 112 mEq/L 87Low    CO2 19 - 32 mEq/L 30   Glucose, Bld 70 - 99 mg/dL 144High    BUN 6 - 23 mg/dL 7   Creatinine, Ser 0.40 - 1.20 mg/dL 0.55   Calcium 8.4 - 10.5 mg/dL 7.2Low    GFR >60.00 mL/min 116.19     EKG None  Radiology No results found.  Procedures Procedures (including critical care time)  Medications Ordered in ED Medications  potassium chloride 10 mEq in 100 mL IVPB (10 mEq Intravenous New Bag/Given 02/18/18 2146)  potassium chloride 10 mEq in 100 mL IVPB (0 mEq Intravenous Stopped 02/18/18 2011)  potassium chloride SA (K-DUR,KLOR-CON) CR tablet 40 mEq (40 mEq Oral Given 02/18/18 1850)  0.9 %  sodium chloride infusion ( Intravenous New Bag/Given 02/18/18 1850)  magnesium sulfate IVPB 2 g 50 mL (0 g Intravenous Stopped 02/18/18 2136)     Initial Impression / Assessment and Plan / ED Course  I have reviewed the triage vital signs and the nursing notes.  Pertinent labs & imaging results that were available during my care of the patient were reviewed by me and considered in my medical decision making (see chart for details).  Potassium and magnesium replaced.   Pt  is feeling much better.  Pt told to stop lasix.  Pt to f/u with GI on the 29th as scheduled.  Final Clinical Impressions(s) / ED Diagnoses   Final diagnoses:  Hypokalemia  Hypomagnesemia  Hyponatremia    ED Discharge Orders        Ordered    potassium chloride SA (K-DUR,KLOR-CON) 20 MEQ tablet  Daily     02/18/18 2202    magnesium oxide (MAG-OX) 400 (241.3 Mg) MG tablet  Daily     02/18/18 2202       Isla Pence, MD 02/18/18 2206

## 2018-02-18 NOTE — Telephone Encounter (Signed)
Spoke to patient and let her know her labs were abnormal and it is very important she head to the ED ASAP. She verbalized understanding, she states she needed to call her wife and then come to the ED.

## 2018-02-18 NOTE — ED Notes (Signed)
Pt denies pain at this time and is alert and ori ted x 4 and is verbally.

## 2018-02-18 NOTE — Discharge Instructions (Addendum)
Stop lasix 

## 2018-02-18 NOTE — ED Triage Notes (Signed)
Patient here from home called by [facility] because of low potassium and sodium. Hx of COPD and cirrhosis. On diuretics.

## 2018-02-21 ENCOUNTER — Other Ambulatory Visit (INDEPENDENT_AMBULATORY_CARE_PROVIDER_SITE_OTHER): Payer: Medicare Other

## 2018-02-21 ENCOUNTER — Ambulatory Visit (INDEPENDENT_AMBULATORY_CARE_PROVIDER_SITE_OTHER): Payer: Medicare Other | Admitting: Nurse Practitioner

## 2018-02-21 ENCOUNTER — Encounter: Payer: Self-pay | Admitting: Nurse Practitioner

## 2018-02-21 VITALS — BP 100/62 | HR 104 | Ht 59.0 in | Wt 79.6 lb

## 2018-02-21 DIAGNOSIS — K7460 Unspecified cirrhosis of liver: Secondary | ICD-10-CM | POA: Diagnosis not present

## 2018-02-21 LAB — BASIC METABOLIC PANEL
BUN: 6 mg/dL (ref 6–23)
CALCIUM: 7.9 mg/dL — AB (ref 8.4–10.5)
CO2: 24 mEq/L (ref 19–32)
CREATININE: 0.48 mg/dL (ref 0.40–1.20)
Chloride: 99 mEq/L (ref 96–112)
GFR: 135.95 mL/min (ref >60.00)
GLUCOSE: 101 mg/dL — AB (ref 70–99)
Potassium: 4.6 mEq/L (ref 3.5–5.1)
SODIUM: 129 meq/L — AB (ref 135–145)

## 2018-02-21 LAB — AFP TUMOR MARKER: AFP-Tumor Marker: 2.9 ng/mL

## 2018-02-21 NOTE — Patient Instructions (Signed)
If you are age 70 or older, your body mass index should be between 23-30. Your Body mass index is 16.08 kg/m. If this is out of the aforementioned range listed, please consider follow up with your Primary Care Provider.  If you are age 80 or younger, your body mass index should be between 19-25. Your Body mass index is 16.08 kg/m. If this is out of the aformentioned range listed, please consider follow up with your Primary Care Provider.   You have been scheduled for an endoscopy. Please follow written instructions given to you at your visit today. If you use inhalers (even only as needed), please bring them with you on the day of your procedure. Your physician has requested that you go to www.startemmi.com and enter the access code given to you at your visit today. This web site gives a general overview about your procedure. However, you should still follow specific instructions given to you by our office regarding your preparation for the procedure.  Your provider has requested that you go to the basement level for lab work before leaving today. Press "B" on the elevator. The lab is located at the first door on the left as you exit the elevator. BMET  Thank you for choosing me and [facility].   Tye Savoy, NP

## 2018-02-21 NOTE — Progress Notes (Addendum)
P GI  Chief Complaint:    Weight loss  IMPRESSION and PLAN:    #1. 72 yo female with cirrhosis, likely ETOH though she hasn't had full workup to rule out other etiologies. She has ongoing Etoh use. Low MELD. She has moderate ascites on u/s but no other signs of decompensation at present.  -continue low dose aldactone, daily weights, 2 gram Na+ diet. Continue K+ supplements for now. Will call with lab results. Renal function is normal. Need to restart low dose lasix soon. If steady gain / loss of weight patient needs to call us.   -[place] surveillance:  AFP normal. No focal liver lesions on recent u/s -Varices screening:  She looks better / stronger than when I saw her In June. Will arrange for initial EGD. The risks and benefits of EGD were discussed and the patient agrees to proceed.  -obviously abstinence for Etoh is of paramount importance -Will need repeat HBV, HAV studies at next visit. Immunize based on results.    #2. Hypokalemia / hypomagnesemia, probably multifactorial (nutritional / ETOH / diuretics) - Oral Mg+ and K+ supplements prescribed by ED. - Will recheck BMET today but hesitant to keep her on Aldactone and K+ supplements without adding back lasix. She is obviously sensitive to diuretics and will need to be monitored with frequent labs for a while. Will call her with results and recommendations  #3. Multiple Myeloma. It was smoldering but patient says Oncology recently told her it was now multiple myeloma     Agree with Cassidy Barrett's assessment and plan. Gatha Mayer, MD, [facility]   HPI:    Cassidy Barrett is a 72 year old female with smoldering multiple myeloma, severe COPD with ongoing tobacco use , osteoporosis with multiple compression fractures, alcohol abuse, cirrhosis based on imaging and hx of a large rectal  tubulovillous adenoma in 2015,  not on surveillance plan due to overall life expectancy.   I saw Cassidy Barrett in the office last month for evaluation of macrocytic anemia.   Her hgb had dropped 2 gms from around 13-11.5 in the setting of NSAIDs.  No overt bleeding.  NSAIDs had already been stopped, I started her on a PPI and repeated labs.  Hgb had declined further to 10. Anemia felt to be multifactorial (BM suppression, nutritional, cirrhosis).   Her alk phos was 700 but in setting of multiple compression fractures. U/S was ordered for further evaluation and to remarkable for cirrhosis with moderate amount of ascites.   A few days following our visit patient's partner called with complaints of lower extremity swelling and patient started on low diuretics, advised to follow 2 gram sodium diet and come for BMET in a week.   Follow-up BMET remarkable for potassium of 2.6, sodium was stable at 129.  I was out of office but Doc of Day sent her to the ED.  Mg+ was repleted, she was given IV K+, home lasix discontinued. Labs not repeated. Patient was sent home with a prescription for magnesium and potassium.  She is here for follow-up.    Since being off the Lasix for a few days [name] feels she is starting to reaccumulate fluid in her ankles and her abdomen looks a little distended. She has continued to weigh herself daily and overall weight is down about 8 pounds over last couple of weeks but it is creeping back up. She has not followed the 2 g sodium diet as closely as possible.  Drinks 1 alcoholic beverage on Saturday and  sometimes one on Sunday but no more than that. She recently had some dark stool but took bismuth. Overall she feels okay.   Review of systems:     No chest pain, no SOB, no fevers, no urinary sx   Past Medical History:  Diagnosis Date  . Alcohol abuse   . Arthritis   . Ascites   . Atherosclerosis of aorta ([place])   . Cholelithiasis   . Cirrhosis ([place])   . Complication of anesthesia    Difficult time waking up after having biospy with colonoscopy  . Compression fracture of lumbar vertebra (H[facility])     L1 and L2  . Emphysema of lung ([place])   . History of  multiple myeloma    smoldering  . Hyponatremia   . Macrocytic anemia   . 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 Osteoporosis   . Pneumonia 04/2014  . Tobacco abuse   . Tubular adenoma   . Vertebral compression fracture ([facility])     [name] surgical history, family medical history, social history, medications and allergies were all reviewed in Epic   Serum creatinine: 0.55 mg/dL 02/18/18 1309 Estimated creatinine clearance: 37.8 mL/min   Physical Exam:     BP 100/62   Pulse (!) 104   Ht _0  (1.499 m)   Wt 79 lb 9.6 oz (36.1 kg)   BMI 16.08 kg/m   GENERAL:  Pleasant frail female in NAD PSYCH: : Cooperative, normal affect EENT:  conjunctiva pink, mucous membranes moist, neck supple without masses CARDIAC:  RRR, no peripheral edema PULM: decreased breath sounds bilaterally ABDOMEN:  Nondistended, soft, mildly distended. No obvious masses. Normal bowel sounds SKIN:  turgor, no lesions seen Musculoskeletal:  Normal muscle tone, normal strength NEURO: Alert and oriented x 3, no focal neurologic deficits   Tye Savoy , NP 02/21/2018, 3:25 PM

## 2018-02-23 ENCOUNTER — Encounter: Payer: Self-pay | Admitting: Nurse Practitioner

## 2018-02-24 ENCOUNTER — Encounter: Payer: Self-pay | Admitting: Nurse Practitioner

## 2018-02-24 ENCOUNTER — Other Ambulatory Visit: Payer: Self-pay

## 2018-02-24 DIAGNOSIS — K7460 Unspecified cirrhosis of liver: Secondary | ICD-10-CM

## 2018-02-24 MED ORDER — FOLIC ACID 1 MG PO TABS: 1.0000 mg | ORAL_TABLET | Freq: Every day | ORAL | 3 refills | Status: DC

## 2018-02-24 MED ORDER — FUROSEMIDE 20 MG PO TABS: 10.0000 mg | ORAL_TABLET | Freq: Every day | ORAL | 1 refills | Status: DC

## 2018-02-28 ENCOUNTER — Ambulatory Visit (HOSPITAL_COMMUNITY)
Admission: RE | Admit: 2018-02-28 | Discharge: 2018-02-28 | Disposition: A | Discharge status: Home / Self Care | Payer: Medicare Other | Source: Ambulatory Visit | Attending: Hematology | Admitting: Hematology

## 2018-02-28 ENCOUNTER — Inpatient Hospital Stay: Payer: Medicare Other | Attending: Hematology

## 2018-02-28 DIAGNOSIS — Z79899 Other long term (current) drug therapy: Secondary | ICD-10-CM | POA: Insufficient documentation

## 2018-02-28 DIAGNOSIS — K7460 Unspecified cirrhosis of liver: Secondary | ICD-10-CM | POA: Insufficient documentation

## 2018-02-28 DIAGNOSIS — M1990 Unspecified osteoarthritis, unspecified site: Secondary | ICD-10-CM | POA: Insufficient documentation

## 2018-02-28 DIAGNOSIS — Z87891 Personal history of nicotine dependence: Secondary | ICD-10-CM | POA: Insufficient documentation

## 2018-02-28 DIAGNOSIS — M8008XS Age-related osteoporosis with current pathological fracture, vertebra(e), sequela: Secondary | ICD-10-CM | POA: Insufficient documentation

## 2018-02-28 DIAGNOSIS — C9000 Multiple myeloma not having achieved remission: Secondary | ICD-10-CM | POA: Insufficient documentation

## 2018-02-28 DIAGNOSIS — D472 Monoclonal gammopathy: Secondary | ICD-10-CM

## 2018-02-28 DIAGNOSIS — J439 Emphysema, unspecified: Secondary | ICD-10-CM | POA: Insufficient documentation

## 2018-02-28 DIAGNOSIS — R109 Unspecified abdominal pain: Secondary | ICD-10-CM | POA: Diagnosis not present

## 2018-02-28 DIAGNOSIS — E46 Unspecified protein-calorie malnutrition: Secondary | ICD-10-CM | POA: Diagnosis not present

## 2018-02-28 DIAGNOSIS — M438X4 Other specified deforming dorsopathies, thoracic region: Secondary | ICD-10-CM | POA: Insufficient documentation

## 2018-02-28 LAB — CMP (CANCER CENTER ONLY)
ALT: 14 U/L (ref 0–44)
AST: 51 U/L — AB (ref 15–41)
Albumin: 1.4 g/dL — ABNORMAL LOW (ref 3.5–5.0)
Alkaline Phosphatase: 183 U/L — ABNORMAL HIGH (ref 38–126)
Anion gap: 8 (ref 5–15)
BILIRUBIN TOTAL: 1.5 mg/dL — AB (ref 0.3–1.2)
BUN: 6 mg/dL — AB (ref 8–23)
CALCIUM: 7.9 mg/dL — AB (ref 8.9–10.3)
CHLORIDE: 99 mmol/L (ref 98–111)
CO2: 24 mmol/L (ref 22–32)
Creatinine: 0.66 mg/dL (ref 0.44–1.00)
Glucose, Bld: 120 mg/dL — ABNORMAL HIGH (ref 70–99)
Potassium: 4.3 mmol/L (ref 3.5–5.1)
Sodium: 131 mmol/L — ABNORMAL LOW (ref 135–145)
TOTAL PROTEIN: 8.6 g/dL — AB (ref 6.5–8.1)

## 2018-02-28 LAB — CBC WITH DIFFERENTIAL/PLATELET
BASOS ABS: 0.1 10*3/uL (ref 0.0–0.1)
BASOS PCT: 1 %
EOS ABS: 0.2 10*3/uL (ref 0.0–0.5)
EOS PCT: 3 %
HCT: 29.3 % — ABNORMAL LOW (ref 34.8–46.6)
HEMOGLOBIN: 9.7 g/dL — AB (ref 11.6–15.9)
LYMPHS ABS: 1.1 10*3/uL (ref 0.9–3.3)
Lymphocytes Relative: 19 %
MCH: 35.9 pg — AB (ref 25.1–34.0)
MCHC: 33.1 g/dL (ref 31.5–36.0)
MCV: 108.5 fL — ABNORMAL HIGH (ref 79.5–101.0)
Monocytes Absolute: 0.7 10*3/uL (ref 0.1–0.9)
Monocytes Relative: 12 %
Neutro Abs: 3.9 10*3/uL (ref 1.5–6.5)
Neutrophils Relative %: 65 %
PLATELETS: 241 10*3/uL (ref 145–400)
RBC: 2.7 MIL/uL — AB (ref 3.70–5.45)
RDW: 17.7 % — ABNORMAL HIGH (ref 11.2–14.5)
WBC: 5.9 10*3/uL (ref 3.9–10.3)

## 2018-03-01 DIAGNOSIS — K7460 Unspecified cirrhosis of liver: Secondary | ICD-10-CM | POA: Diagnosis not present

## 2018-03-01 DIAGNOSIS — I700 Atherosclerosis of aorta: Secondary | ICD-10-CM | POA: Diagnosis not present

## 2018-03-01 DIAGNOSIS — E876 Hypokalemia: Secondary | ICD-10-CM | POA: Diagnosis not present

## 2018-03-01 DIAGNOSIS — B199 Unspecified viral hepatitis without hepatic coma: Secondary | ICD-10-CM | POA: Diagnosis not present

## 2018-03-01 DIAGNOSIS — C9000 Multiple myeloma not having achieved remission: Secondary | ICD-10-CM | POA: Diagnosis not present

## 2018-03-01 DIAGNOSIS — E871 Hypo-osmolality and hyponatremia: Secondary | ICD-10-CM | POA: Diagnosis not present

## 2018-03-02 ENCOUNTER — Encounter: Payer: Self-pay | Admitting: Internal Medicine

## 2018-03-02 ENCOUNTER — Ambulatory Visit (AMBULATORY_SURGERY_CENTER): Payer: Medicare Other | Admitting: Internal Medicine

## 2018-03-02 ENCOUNTER — Other Ambulatory Visit (INDEPENDENT_AMBULATORY_CARE_PROVIDER_SITE_OTHER): Payer: Medicare Other

## 2018-03-02 VITALS — BP 106/64 | HR 110 | Temp 98.7°F | Resp 22 | Ht 59.0 in | Wt 79.6 lb

## 2018-03-02 DIAGNOSIS — K7460 Unspecified cirrhosis of liver: Secondary | ICD-10-CM | POA: Diagnosis not present

## 2018-03-02 LAB — BASIC METABOLIC PANEL
BUN: 7 mg/dL (ref 6–23)
CO2: 28 mEq/L (ref 19–32)
Calcium: 7.3 mg/dL — ABNORMAL LOW (ref 8.4–10.5)
Chloride: 99 mEq/L (ref 96–112)
Creatinine, Ser: 0.43 mg/dL (ref 0.40–1.20)
GFR: 154.34 mL/min (ref >60.00)
Glucose, Bld: 90 mg/dL (ref 70–99)
Potassium: 3.7 mEq/L (ref 3.5–5.1)
Sodium: 131 mEq/L — ABNORMAL LOW (ref 135–145)

## 2018-03-02 LAB — MULTIPLE MYELOMA PANEL, SERUM
ALBUMIN/GLOB SERPL: 0.4 — AB (ref 0.7–1.7)
ALPHA2 GLOB SERPL ELPH-MCNC: 0.5 g/dL (ref 0.4–1.0)
Albumin SerPl Elph-Mcnc: 2.1 g/dL — ABNORMAL LOW (ref 2.9–4.4)
Alpha 1: 0.2 g/dL (ref 0.0–0.4)
B-GLOBULIN SERPL ELPH-MCNC: 4.5 g/dL — AB (ref 0.7–1.3)
GAMMA GLOB SERPL ELPH-MCNC: 0.8 g/dL (ref 0.4–1.8)
GLOBULIN, TOTAL: 6 g/dL — AB (ref 2.2–3.9)
IGG (IMMUNOGLOBIN G), SERUM: 864 mg/dL (ref 700–1600)
IgA: 5283 mg/dL — ABNORMAL HIGH (ref 87–352)
IgM (Immunoglobulin M), Srm: 167 mg/dL (ref 26–217)
M PROTEIN SERPL ELPH-MCNC: 3.4 g/dL — AB
TOTAL PROTEIN ELP: 8.1 g/dL (ref 6.0–8.5)

## 2018-03-02 LAB — PROTIME-INR
INR: 1.4 ratio — AB (ref 0.8–1.0)
PROTHROMBIN TIME: 16.7 s — AB (ref 9.6–13.1)

## 2018-03-02 MED ORDER — SODIUM CHLORIDE 0.9 % IV SOLN: 500.0000 mL | Freq: Once | INTRAVENOUS | Status: DC

## 2018-03-02 NOTE — Op Note (Signed)
[place] Patient Name: Cassidy Barrett Procedure Date: 03/02/2018 4:30 PM MRN: [id number] Endoscopist: Gatha Mayer , MD Age: 72 Referring MD:  Date of Birth: Aug 28, 1945 Gender: Female Account #: [id number] Procedure:                Upper GI endoscopy Indications:              Cirrhosis rule out esophageal varices Medicines:                Propofol per Anesthesia, Monitored Anesthesia Care Procedure:                Pre-Anesthesia Assessment:                           - Prior to the procedure, a History and Physical                            was performed, and patient medications and                            allergies were reviewed. The patient's tolerance of                            previous anesthesia was also reviewed. The risks                            and benefits of the procedure and the sedation                            options and risks were discussed with the patient.                            All questions were answered, and informed consent                            was obtained. Prior Anticoagulants: The patient has                            taken no previous anticoagulant or antiplatelet                            agents. ASA Grade Assessment: II - A patient with                            mild systemic disease. After reviewing the risks                            and benefits, the patient was deemed in                            satisfactory condition to undergo the procedure.                           After obtaining informed consent, the endoscope was  passed under direct vision. Throughout the                            procedure, the patient's blood pressure, pulse, and                            oxygen saturations were monitored continuously. The                            Model GIF-[id number]) scope was introduced                            through the mouth, and advanced to the second part         of duodenum. The upper GI endoscopy was                            accomplished without difficulty. The patient                            tolerated the procedure well. Scope In: Scope Out: Findings:                 The esophagus was normal.                           The stomach was normal.                           The examined duodenum was normal.                           The cardia and gastric fundus were normal on                            retroflexion. Complications:            No immediate complications. Estimated Blood Loss:     Estimated blood loss: none. Impression:               - Normal esophagus.                           - Normal stomach.                           - Normal examined duodenum.                           - No specimens collected. Recommendation:           - Patient has a contact number available for                            emergencies. The signs and symptoms of potential                            delayed complications were discussed with the  patient. Return to normal activities tomorrow.                            Written discharge instructions were provided to the                            patient.                           - Resume previous diet.                           - Continue present medications.                           - Repeat upper endoscopy in 1 year for screening                            purposes.                           - Return to GI clinic in 3 months. patient to call                            and get appointment Go to basement for labs ordered                            but not yet done re: hep A and B immunity Gatha Mayer, MD 03/02/2018 4:55:11 PM This report has been signed electronically.

## 2018-03-02 NOTE — Progress Notes (Signed)
A and O x3. Report to RN. Tolerated MAC anesthesia well.Gums unchanged after procedure. 

## 2018-03-02 NOTE — Patient Instructions (Addendum)
This looks ok - no complications of cirrhosis seen.  You do need to go to the basement and get some lab tests done that we ordered before but are not yet done.  We will take you down. Once I se those results will let you know.  Please do not drink alcohol.  Please call soon and make an appointment to see [place] in October  I appreciate the opportunity to care for you. Gatha Mayer, MD, FACG  YOU HAD AN ENDOSCOPIC PROCEDURE TODAY AT [facility]:   Refer to the procedure report that was given to you for any specific questions about what was found during the examination.  If the procedure report does not answer your questions, please call your gastroenterologist to clarify.  If you requested that your care partner not be given the details of your procedure findings, then the procedure report has been included in a sealed envelope for you to review at your convenience later.  YOU SHOULD EXPECT: Some feelings of bloating in the abdomen. Passage of more gas than usual.  Walking can help get rid of the air that was put into your GI tract during the procedure and reduce the bloating. If you had a lower endoscopy (such as a colonoscopy or flexible sigmoidoscopy) you may notice spotting of blood in your stool or on the toilet paper. If you underwent a bowel prep for your procedure, you may not have a normal bowel movement for a few days.  Please Note:  You might notice some irritation and congestion in your nose or some drainage.  This is from the oxygen used during your procedure.  There is no need for concern and it should clear up in a day or so.  SYMPTOMS TO REPORT IMMEDIATELY:     Following upper endoscopy (EGD)  Vomiting of blood or coffee ground material  New chest pain or pain under the shoulder blades  Painful or persistently difficult swallowing  New shortness of breath  Fever of 100F or higher  Black, tarry-looking stools  For urgent or emergent issues, a  gastroenterologist can be reached at any hour by calling [phone].   DIET:  We do recommend a small meal at first, but then you may proceed to your regular diet.  Drink plenty of fluids but you should avoid alcoholic beverages for 24 hours.  ACTIVITY:  You should plan to take it easy for the rest of today and you should NOT DRIVE or use heavy machinery until tomorrow (because of the sedation medicines used during the test).    FOLLOW UP: Our staff will call the number listed on your records the next business day following your procedure to check on you and address any questions or concerns that you may have regarding the information given to you following your procedure. If we do not reach you, we will leave a message.  However, if you are feeling well and you are not experiencing any problems, there is no need to return our call.  We will assume that you have returned to your regular daily activities without incident.  If any biopsies were taken you will be contacted by phone or by letter within the next 1-3 weeks.  Please call us at [phone] if you have not heard about the biopsies in 3 weeks.    SIGNATURES/CONFIDENTIALITY: You and/or your care partner have signed paperwork which will be entered into your electronic medical record.  These signatures attest to the  fact that that the information above on your After Visit Summary has been reviewed and is understood.  Full responsibility of the confidentiality of this discharge information lies with you and/or your care-partner.

## 2018-03-03 ENCOUNTER — Telehealth: Payer: Self-pay

## 2018-03-03 LAB — HEPATITIS C ANTIBODY
HEP C AB: NONREACTIVE
SIGNAL TO CUT-OFF: 0.02 (ref <1.00)

## 2018-03-03 LAB — IRON AND TIBC
IRON: 109 ug/dL (ref 27–139)
Iron Saturation: 86 % (ref 15–55)
Total Iron Binding Capacity: 127 ug/dL — ABNORMAL LOW (ref 250–450)
UIBC: 18 ug/dL — ABNORMAL LOW (ref 118–369)

## 2018-03-03 LAB — HEPATITIS A ANTIBODY, TOTAL: Hepatitis A AB,Total: NONREACTIVE

## 2018-03-03 LAB — HEPATITIS B SURFACE ANTIGEN: Hepatitis B Surface Ag: NONREACTIVE

## 2018-03-03 LAB — HEPATITIS B SURFACE ANTIBODY,QUALITATIVE: Hep B S Ab: NONREACTIVE

## 2018-03-03 NOTE — Telephone Encounter (Signed)
  Follow up Call-  Call back number 03/02/2018  Post procedure Call Back phone  # [phone]   okay to speak with [name]  Permission to leave phone message Yes  Some recent data might be hidden     Patient questions:  Do you have a fever, pain , or abdominal swelling? No. Pain Score  0 *  Have you tolerated food without any problems? Yes.    Have you been able to return to your normal activities? Yes.    Do you have any questions about your discharge instructions: Diet   No. Medications  No. Follow up visit  No.  Do you have questions or concerns about your Care? No.  Actions: * If pain score is 4 or above: No action needed, pain <4.

## 2018-03-07 ENCOUNTER — Other Ambulatory Visit: Payer: Self-pay

## 2018-03-07 ENCOUNTER — Telehealth: Payer: Self-pay

## 2018-03-07 MED ORDER — FUROSEMIDE 20 MG PO TABS: 10.0000 mg | ORAL_TABLET | Freq: Every day | ORAL | 1 refills | Status: DC

## 2018-03-07 MED ORDER — MAGNESIUM OXIDE 400 (241.3 MG) MG PO TABS: 400.0000 mg | ORAL_TABLET | Freq: Every day | ORAL | 0 refills | Status: DC

## 2018-03-07 MED ORDER — SPIRONOLACTONE 50 MG PO TABS: 50.0000 mg | ORAL_TABLET | Freq: Every day | ORAL | 0 refills | Status: DC

## 2018-03-07 MED ORDER — POTASSIUM CHLORIDE 20 MEQ PO PACK: 40.0000 meq | PACK | Freq: Two times a day (BID) | ORAL | 0 refills | Status: DC

## 2018-03-07 NOTE — Telephone Encounter (Signed)
Please review and advise.  Potassium and mag-oxide from ER werw for 15 days, and are gone. No refills.  Spironolactone from you has 3 more doses, with no refill. Do you want them refilled?  Still on lasix.  Weights since last visit  7/30. 77.2  7/31. 76.4  8/1. 74.8  8/2 74.8  8/3. 71.6  8/4. 73.4  8/5. 72.2  8/6. 73.0  8/7. 71.6  8/8. 73.4  8/9. 71.6  8/10. 72.2  8/11. 69.8

## 2018-03-07 NOTE — Telephone Encounter (Signed)
Information sent via patient message.

## 2018-03-07 NOTE — Telephone Encounter (Signed)
Refill all of the medications you asked about at same doses  K, Mg and spironolactone  The weight coming down is from fluid loss  We expect and want that  She needs to see Nevin Bloodgood in September - looks like that schedule not set up yet but we need to make sure she gets an appointment so

## 2018-03-09 ENCOUNTER — Other Ambulatory Visit: Payer: Self-pay | Admitting: *Deleted

## 2018-03-09 MED ORDER — POTASSIUM CHLORIDE 20 MEQ PO PACK: 40.0000 meq | PACK | Freq: Two times a day (BID) | ORAL | 0 refills | Status: DC

## 2018-03-09 NOTE — Telephone Encounter (Signed)
Ok for 90 day supply of potassium per Dr Carlean Purl. 90 day supply was requested by pharmacy.

## 2018-03-10 ENCOUNTER — Telehealth: Payer: Self-pay | Admitting: Hematology

## 2018-03-10 DIAGNOSIS — E871 Hypo-osmolality and hyponatremia: Secondary | ICD-10-CM | POA: Diagnosis not present

## 2018-03-10 DIAGNOSIS — C9000 Multiple myeloma not having achieved remission: Secondary | ICD-10-CM | POA: Diagnosis not present

## 2018-03-10 DIAGNOSIS — I700 Atherosclerosis of aorta: Secondary | ICD-10-CM | POA: Diagnosis not present

## 2018-03-10 DIAGNOSIS — E876 Hypokalemia: Secondary | ICD-10-CM | POA: Diagnosis not present

## 2018-03-10 DIAGNOSIS — B199 Unspecified viral hepatitis without hepatic coma: Secondary | ICD-10-CM | POA: Diagnosis not present

## 2018-03-10 DIAGNOSIS — K7460 Unspecified cirrhosis of liver: Secondary | ICD-10-CM | POA: Diagnosis not present

## 2018-03-10 NOTE — Telephone Encounter (Signed)
Scheduled appt per 8/14 sch message - left message with appt date and time.

## 2018-03-12 ENCOUNTER — Encounter: Payer: Self-pay | Admitting: Hematology

## 2018-03-14 DIAGNOSIS — C9000 Multiple myeloma not having achieved remission: Secondary | ICD-10-CM | POA: Diagnosis not present

## 2018-03-14 DIAGNOSIS — E876 Hypokalemia: Secondary | ICD-10-CM | POA: Diagnosis not present

## 2018-03-14 DIAGNOSIS — K7460 Unspecified cirrhosis of liver: Secondary | ICD-10-CM | POA: Diagnosis not present

## 2018-03-14 DIAGNOSIS — B199 Unspecified viral hepatitis without hepatic coma: Secondary | ICD-10-CM | POA: Diagnosis not present

## 2018-03-14 DIAGNOSIS — E871 Hypo-osmolality and hyponatremia: Secondary | ICD-10-CM | POA: Diagnosis not present

## 2018-03-14 DIAGNOSIS — I700 Atherosclerosis of aorta: Secondary | ICD-10-CM | POA: Diagnosis not present

## 2018-03-14 NOTE — Progress Notes (Signed)
Marland Kitchen    HEMATOLOGY/ONCOLOGY CLINIC NOTE  Date of Service: 03/15/18     Patient Care Team: Lanice Shirts, MD as PCP - General (Internal Medicine) Jeralene Peters, DDS as Dietitian (Periodontics)  Orthopedics - Dr. Layne Benton from [facility]   CHIEF COMPLAINTS/PURPOSE OF CONSULTATION:   F/u for smoldering multiple myeloma  HISTORY OF PRESENTING ILLNESS:   Cassidy Barrett is a wonderful 72 y.o. female who has been referred to [place] by Dr Wandra Feinstein for evaluation and management of possible plasma cell dyscrasia.  Patient has a history of liver cirrhosis with ascites, alcohol abuse, emphysema, macrocytic anemia, osteoporosis and was seen by orthopedics for spinal vertebral compression fractures in the setting of osteoporosis.  She was noted to have multiple significant lab abnormalities including an elevated alkaline phosphatase and high total protein level.  Chronic appealing L1-L2 compression fractures  Patient subsequently had a bone scan to evaluate these compression fractures on 09/26/2015.  This showed mild increased activity within the superior endplates of L3, L4 and L5 likely representing subacute fractures. Increased activity within the T2 vertebral body compatible with known compression fracture. No evidence of suggest bony metastatic disease.  As a part of her workup she also had an SPEP done which shows 3 monoclonal protein bands for a total M protein of 2.3 g/dL. On IFE this is noted to be monoclonal IgA kappa protein. Her sedimentation rate was also elevated at 73.  She was referred to [place] for further evaluation to rule out multiple myeloma. Patient notes her mid and lower back pain related to her compression fractures. Also notes some left hip pain. Her labs from January 2017 did not show any anemia, hypercalcemia or renal failure.  She notes that she did have a colonoscopy in 2015 which demonstrated a 4.5 cm rectal polyp which was removed  piecemeal. Pathology showed a tubulovillous adenoma. Patient was also noted to have significant diverticulosis and was not recommended routine colonoscopies. Given her CT scan showing concern of rectal mass she was referred back to Dr. Henrene Pastor at [facility] for consideration of a sigmoidoscopy/colonoscopy. Patient notes no overt rectal bleeding. No obstructive symptoms. No change in bowel habits. No new abdominal pain. She also needs to connect with gi for management of her liver cirrhosis and related issues.   INTERVAL HISTORY:  SOLEY Barrett is here for her scheduled followup for smoldering myeloma. The patient's last visit with [place] was on 02/01/18. She is accompanied today by her daughter. The pt reports that she is doing well overall.   The pt reports that her abdominal pain is a little better and she recently had an unremarkable endoscopy with GI Dr. Carlean Purl. She notes that her appetite has improved and she has been eating better. The pt was recently put on Lasix for ankle swelling which worsened and resolved in the interim. She is also on Spironolactone and is checking her weights at home.   The pt notes that her dental procedures have ended and she no longer has any mouth sores.   The pt had chicken pox as a child, has not had Shingles, and has not had a shingles vaccine.   Of note since the patient's last visit, pt has had Bone Survey completed on 02/28/18 with results revealing New T7 compression deformity. Multiple chronic compression deformities are noted. No definitive lytic lesions are identified.  Lab results (02/28/18) of CBC w/diff, CMP, and Reticulocytes is as follows: all values are WNL except for RBC  at 2.70, HGB at 9.7, MCV at 108.5, MCH at 35.9, RDW at 17.7, Sodium at 131, Glucose at 120, BUN at 6, Calcium at 7.9, Total Protein at 8.6, Albumin at 1.4, AST at 51, Alk Phos at 183, Total Bilirubin at 1.5. MMP 02/28/18 revealed all values WNL except for IgA at 5283, Albumin at 2.1,  B-globulin at 4.5, M Protein at 3.4, Globulin total at 6.0, Albumin/Glob at 0.4.   On review of systems, pt reports resolved ankle swelling, resolved abdominal pains, increased appetite, eating better, stable back pain, and denies tingling/numbness in her hands/feet, new bone pains, abdominal pain, leg swelling, and any other symptoms.   MEDICAL HISTORY:  Past Medical History:  Diagnosis Date  . Alcohol abuse   . Arthritis   . Ascites   . Atherosclerosis of aorta ([place])   . Cholelithiasis   . Cirrhosis ([place])   . Complication of anesthesia    Difficult time waking up after having biospy with colonoscopy  . Compression fracture of lumbar vertebra (H[facility])     L1 and L2  . Emphysema of lung ([place])   . History of multiple myeloma    smoldering  . Hyponatremia   . Macrocytic anemia   . 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 Osteoporosis   . Pneumonia 04/2014  . Tobacco abuse   . Tubular adenoma   . Vertebral compression fracture (H[facility])    ?? 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  SURGICAL HISTORY: Past Surgical History:  Procedure Laterality Date  . COLONOSCOPY N/A 05/24/2014   Procedure: COLONOSCOPY;  Surgeon: Gatha Mayer, MD;  Location: [facility];  Service: Endoscopy;  Laterality: N/A;  MAC if possible ultraslim colonoscope and gastroscope needed  . FLEXIBLE SIGMOIDOSCOPY N/A 05/21/2014   Procedure: FLEXIBLE SIGMOIDOSCOPY;  Surgeon: Gatha Mayer, MD;  Location: [facility];  Service: Endoscopy;  Laterality: N/A;  . KYPHOPLASTY N/A 12/12/2014   Procedure: KYPHOPLASTY;  Surgeon: Phylliss Bob, MD;  Location: [facility];  Service: Orthopedics;  Laterality: N/A;  T10 kyphoplasty  . MANDIBLE FRACTURE SURGERY     X 2  . TONSILLECTOMY      SOCIAL HISTORY: Social History   Socioeconomic History  . Marital status: Married    Spouse name: Not on file  . Number of children: Not on file  . Years of education: Not on file  . Highest education level: Not on  file  Occupational History  . Occupation: retired -[employer]  Social Needs  . Financial resource strain: Not on file  . Food insecurity:    Worry: Not on file    Inability: Not on file  . Transportation needs:    Medical: Not on file    Non-medical: Not on file  Tobacco Use  . Smoking status: Former Smoker    Packs/day: 0.25    Years: 46.00    Pack years: 11.50    Types: Cigarettes    Last attempt to quit: 02/24/2017    Years since quitting: 1.0  . Smokeless tobacco: Never Used  Substance and Sexual Activity  . Alcohol use: Yes    Alcohol/week: 0.0 standard drinks    Comment: 1-4 alcoholic beverages daily  . Drug use: No  . Sexual activity: Not on file  Lifestyle  . Physical activity:    Days per week: Not on file    Minutes per session: Not on file  . Stress: Not on file  Relationships  . Social connections:    Talks on  phone: Not on file    Gets together: Not on file    Attends religious service: Not on file    Active member of club or organization: Not on file    Attends meetings of clubs or organizations: Not on file    Relationship status: Not on file  . Intimate partner violence:    Fear of current or ex partner: Not on file    Emotionally abused: Not on file    Physically abused: Not on file    Forced sexual activity: Not on file  Other Topics Concern  . Not on file  Social History Narrative  . Not on file   Patient notes that she is drinking about 1 beer and 1 hard liquor every day - was counseled regarding absolute alcohol abstinence.  FAMILY HISTORY:  No family history of colon cancer.  ALLERGIES:  is allergic to bee venom; benadryl [diphenhydramine]; hydrocodone-acetaminophen; oxycodone; tape; and valium [diazepam].  MEDICATIONS:  Current Outpatient Medications  Medication Sig Dispense Refill  . albuterol (PROVENTIL HFA;VENTOLIN HFA) 108 (90 Base) MCG/ACT inhaler INHALE 2 PUFFS INTO THE LUNGS EVERY 6 HOURS AS NEEDED FOR WHEEZING OR SHORTNESS  OF BREATH 18 g 1  . ANORO ELLIPTA 62.5-25 MCG/INH AEPB INHALE 1 PUFF INTO THE LUNGS DAILY 60 each 11  . calcitonin, salmon, (MIACALCIN/FORTICAL) 200 UNIT/ACT nasal spray U 1 SPRAY NASALLY D. ALTERNATING NOSTRILS  3  . folic acid (FOLVITE) 1 MG tablet Take 1 tablet (1 mg total) by mouth daily. 30 tablet 3  . furosemide (LASIX) 20 MG tablet Take 0.5 tablets (10 mg total) by mouth daily. 30 tablet 1  . lidocaine (LIDODERM) 5 % Place 1-2 patches onto the skin daily as needed (severe pain). Remove & Discard patch within 12 hours or as directed by MD    . magnesium oxide (MAG-OX) 400 (241.3 Mg) MG tablet Take 1 tablet (400 mg total) by mouth daily. 30 tablet 0  . Menthol, Topical Analgesic, (BIOFREEZE EX) Apply topically as directed.    Marland Kitchen OVER THE COUNTER MEDICATION Apply 400 mg topically daily as needed (arthritis/back pain). CBD cream    . pantoprazole (PROTONIX) 40 MG tablet Take 1 tablet (40 mg total) by mouth daily. 90 tablet 1  . potassium chloride (KLOR-CON) 20 MEQ packet Take 40 mEq by mouth 2 (two) times daily. 180 tablet 0  . spironolactone (ALDACTONE) 50 MG tablet Take 1 tablet (50 mg total) by mouth daily. 30 tablet 0   No current facility-administered medications for this visit.     REVIEW OF SYSTEMS:    A 10+ POINT REVIEW OF SYSTEMS WAS OBTAINED including neurology, dermatology, psychiatry, cardiac, respiratory, lymph, extremities, GI, GU, Musculoskeletal, constitutional, breasts, reproductive, HEENT.  All pertinent positives are noted in the HPI.  All others are negative.   PHYSICAL EXAMINATION: ECOG PERFORMANCE STATUS: 2 - Symptomatic, <50% confined to bed  Vitals:   03/15/18 1520  BP: 108/66  Pulse: (!) 125  Resp: 18  Temp: 98 F (36.7 C)  SpO2: 95%   Filed Weights   03/15/18 1520  Weight: 72 lb 14.4 oz (33.1 kg)   .Body mass index is 14.72 kg/m.   GENERAL:alert, in no acute distress and comfortable SKIN: no acute rashes, no significant lesions EYES: conjunctiva  are pink and non-injected, sclera anicteric OROPHARYNX: MMM, no exudates, no oropharyngeal erythema or ulceration NECK: supple, no JVD LYMPH:  no palpable lymphadenopathy in the cervical, axillary or inguinal regions LUNGS: clear to auscultation b/l with normal respiratory  effort HEART: regular rate & rhythm ABDOMEN:  normoactive bowel sounds , non tender, not distended. No palpable hepatosplenomegaly.  Extremity: 1+ pedal edema PSYCH: alert & oriented x 3 with fluent speech NEURO: no focal motor/sensory deficits   LABORATORY DATA:  I have reviewed the data as listed  . CBC Latest Ref Rng & Units 03/21/2018 02/28/2018 02/18/2018  WBC 4.0 - 10.5 K/uL 8.4 5.9 5.6  Hemoglobin 12.0 - 15.0 g/dL 10.9(L) 9.7(L) 9.7(L)  Hematocrit 36.0 - 46.0 % 31.0(L) 29.3(L) 29.3(L)  Platelets 150 - 400 K/uL 240 241 226.0    . CMP Latest Ref Rng & Units 03/02/2018 02/28/2018 02/21/2018  Glucose 70 - 99 mg/dL 90 120(H) 101(H)  BUN 6 - 23 mg/dL 7 6(L) 6  Creatinine 0.40 - 1.20 mg/dL 0.43 0.66 0.48  Sodium 135 - 145 mEq/L 131(L) 131(L) 129(L)  Potassium 3.5 - 5.1 mEq/L 3.7 4.3 4.6  Chloride 96 - 112 mEq/L 99 99 99  CO2 19 - 32 mEq/L _0 Calcium 8.4 - 10.5 mg/dL 7.3(L) 7.9(L) 7.9(L)  Total Protein 6.5 - 8.1 g/dL - 8.6(H) -  Total Bilirubin 0.3 - 1.2 mg/dL - 1.5(H) -  Alkaline Phos 38 - 126 U/L - 183(H) -  AST 15 - 41 U/L - 51(H) -  ALT 0 - 44 U/L - 14 -      Component     Latest Ref Rng & Units 02/18/2018 03/02/2018  TIBC     250 - 450 ug/dL  127 (L)  UIBC     118 - 369 ug/dL  18 (L)  Iron     27 - 139 ug/dL  109  Iron Saturation     15 - 55 %  86 (HH)  Hepatitis C Ab     NON-REACTI  NON-REACTIVE  SIGNAL TO CUT-OFF     <1.00  0.02  AFP Tumor Marker     ng/mL 2.9   Hepatitis A AB,Total     NON-REACTI  NON-REACTIVE  Hep B S Ab     NON-REACTI  NON-REACTIVE  Hepatitis B Surface Ag     NON-REACTI  NON-REACTIVE       RADIOGRAPHIC STUDIES: I have personally reviewed the radiological  images as listed and agreed with the findings in the report. Dg Bone Survey Met  Result Date: 02/28/2018 CLINICAL DATA:  Multiple myeloma EXAM: METASTATIC BONE SURVEY COMPARISON:  05/12/2016 FINDINGS: Lateral view of the skull was obtained and reveals no focal lytic lesions. Views of the cervical spine reveal degenerative change without lytic lesion. There are changes consistent with compression fractures at T11-T12 and L1 with prior augmentation at T12. These are stable from the prior exam. There is a significant compression at T7 new from the prior exam. Multilevel osteophytic changes are seen. Lumbar spine demonstrates compression deformities of L1, L4 and L5 which appear stable from the previous exam. No new compression fracture is seen. The pelvis demonstrates an intact pelvic ring. No definitive lytic lesions are seen. The lower extremities are well visualized bilaterally as are the upper extremities and no definitive lytic lesions are noted. IMPRESSION: New T7 compression deformity. Multiple chronic compression deformities are noted. No definitive lytic lesions are identified. Electronically Signed   By: Inez Catalina M.D.   On: 02/28/2018 13:37     ASSESSMENT & PLAN:   72 y.o. Caucasian female with severe osteoporosis and vertebral compression fractures  #1 IgA kappa Smoldering Multiple myeloma    M protein level increased significantly to  3 ( 1.5-->1.8 ---> 1.9-->1.8-->1.7--> 3-->3.4 Noted to have new anemia with a hgb of 10 Labs with no signs of  hypercalcemia or renal failure. No overt focal new bone pains. Bm Bx shows 21% abnormal plasma cells. Rpt Bone Survey on 05/12/2016 -- showed no evidence of lytic lesions.  PLAN: -Protein malnutrition observed- encouraged pt to begin eating more as best as she is able to.  -Counseled pt for complete alcohol cessation -Discussed pt labwork from 02/28/18; HGB down to 9.7, IgA at 5.28g, M Protein at 3.4, normal Creatinine and Calcium at 7.9. Albumin  at 1.4, stable liver functions -Discussed the 02/28/18 Bone Survey which revealed New T7 compression deformity. Multiple chronic compression deformities are noted. No definitive lytic lesions are identified. -Discussed my concern that the patient's M Protein doubling in the last five months to 3.4, IgA increasing to 5.28g, and HGB decreasing to 9.7 are indicative towards progression to Multiple Myeloma -Will repeat BM Bx -Will order PET/CT  -Will treat with Velcade if BM Bx confirms progression to Multiple Myeloma -Will begin Vitamin D replacement -Will hold Calcitonin spray -Anticipate adding on Xgeva every 4 weeks as well    #2 liver cirrhosis with known varices. Increased bilirubin levels. Plan -Counseled on absolute alcohol cessation. -continue f/u with PCP  #3 osteoporosis with vertebral compression fractures.   #4 ?? history of osteonecrosis of the jaw with Fosamax/bisphosphonates previously . Unclear if this truly represents ONJ ---patient decribes it as "having brittle teeth"  Plan  -Patient has been following with orthopedics Dr. Wandra Feinstein to manage her osteoporosis . -continue aggressive vitamin D replacement . -Pt has had full dental extraction and is recovering well.  -mx per orthopedics.   PET/CT in 5 days CT bone marrow biopsy in 3-4 days RTC with Dr Irene Limbo in 10 days with labs   All of the patients and her female partners questions were answered to their apparent satisfaction. The patient knows to call the clinic with any problems, questions or concerns.  The total time spent in the appt was 30 minutes and more than 50% was on counseling and direct patient cares.     Sullivan Lone MD MS AAHIVMS [facility] Hematology/Oncology Physician [facility]  (Office):       [phone] (Work cell):  [phone] (Fax):           [phone]  I, Baldwin Jamaica, am acting as a scribe for Dr. Irene Limbo  .I have reviewed the above documentation for accuracy and  completeness, and I agree with the above. Brunetta Genera MD

## 2018-03-15 ENCOUNTER — Inpatient Hospital Stay (HOSPITAL_BASED_OUTPATIENT_CLINIC_OR_DEPARTMENT_OTHER): Payer: Medicare Other | Admitting: Hematology

## 2018-03-15 VITALS — BP 108/66 | HR 125 | Temp 98.0°F | Resp 18 | Ht 59.0 in | Wt 72.9 lb

## 2018-03-15 DIAGNOSIS — E46 Unspecified protein-calorie malnutrition: Secondary | ICD-10-CM | POA: Diagnosis not present

## 2018-03-15 DIAGNOSIS — M8008XS Age-related osteoporosis with current pathological fracture, vertebra(e), sequela: Secondary | ICD-10-CM | POA: Diagnosis not present

## 2018-03-15 DIAGNOSIS — R109 Unspecified abdominal pain: Secondary | ICD-10-CM | POA: Diagnosis not present

## 2018-03-15 DIAGNOSIS — J439 Emphysema, unspecified: Secondary | ICD-10-CM

## 2018-03-15 DIAGNOSIS — Z87891 Personal history of nicotine dependence: Secondary | ICD-10-CM | POA: Diagnosis not present

## 2018-03-15 DIAGNOSIS — C9000 Multiple myeloma not having achieved remission: Secondary | ICD-10-CM | POA: Diagnosis not present

## 2018-03-15 DIAGNOSIS — M1990 Unspecified osteoarthritis, unspecified site: Secondary | ICD-10-CM | POA: Diagnosis not present

## 2018-03-15 DIAGNOSIS — K7460 Unspecified cirrhosis of liver: Secondary | ICD-10-CM

## 2018-03-15 DIAGNOSIS — Z79899 Other long term (current) drug therapy: Secondary | ICD-10-CM | POA: Diagnosis not present

## 2018-03-17 ENCOUNTER — Ambulatory Visit: Payer: Self-pay | Admitting: Internal Medicine

## 2018-03-18 ENCOUNTER — Other Ambulatory Visit: Payer: Self-pay | Admitting: Physician Assistant

## 2018-03-18 ENCOUNTER — Encounter: Payer: Self-pay | Admitting: Physician Assistant

## 2018-03-18 DIAGNOSIS — E871 Hypo-osmolality and hyponatremia: Secondary | ICD-10-CM | POA: Diagnosis not present

## 2018-03-18 DIAGNOSIS — I700 Atherosclerosis of aorta: Secondary | ICD-10-CM | POA: Diagnosis not present

## 2018-03-18 DIAGNOSIS — K7460 Unspecified cirrhosis of liver: Secondary | ICD-10-CM | POA: Diagnosis not present

## 2018-03-18 DIAGNOSIS — B199 Unspecified viral hepatitis without hepatic coma: Secondary | ICD-10-CM | POA: Diagnosis not present

## 2018-03-18 DIAGNOSIS — C9000 Multiple myeloma not having achieved remission: Secondary | ICD-10-CM | POA: Diagnosis not present

## 2018-03-18 DIAGNOSIS — E876 Hypokalemia: Secondary | ICD-10-CM | POA: Diagnosis not present

## 2018-03-21 ENCOUNTER — Other Ambulatory Visit: Payer: Self-pay

## 2018-03-21 ENCOUNTER — Encounter (HOSPITAL_COMMUNITY): Payer: Self-pay

## 2018-03-21 ENCOUNTER — Ambulatory Visit (HOSPITAL_COMMUNITY)
Admission: RE | Admit: 2018-03-21 | Discharge: 2018-03-21 | Disposition: A | Discharge status: Home / Self Care | Payer: Medicare Other | Source: Ambulatory Visit | Attending: Hematology | Admitting: Hematology

## 2018-03-21 DIAGNOSIS — Z79899 Other long term (current) drug therapy: Secondary | ICD-10-CM | POA: Diagnosis not present

## 2018-03-21 DIAGNOSIS — Z825 Family history of asthma and other chronic lower respiratory diseases: Secondary | ICD-10-CM | POA: Insufficient documentation

## 2018-03-21 DIAGNOSIS — Z91030 Bee allergy status: Secondary | ICD-10-CM | POA: Insufficient documentation

## 2018-03-21 DIAGNOSIS — Z87891 Personal history of nicotine dependence: Secondary | ICD-10-CM | POA: Insufficient documentation

## 2018-03-21 DIAGNOSIS — D4989 Neoplasm of unspecified behavior of other specified sites: Secondary | ICD-10-CM | POA: Diagnosis not present

## 2018-03-21 DIAGNOSIS — Z888 Allergy status to other drugs, medicaments and biological substances status: Secondary | ICD-10-CM | POA: Insufficient documentation

## 2018-03-21 DIAGNOSIS — K7460 Unspecified cirrhosis of liver: Secondary | ICD-10-CM | POA: Diagnosis not present

## 2018-03-21 DIAGNOSIS — Z8249 Family history of ischemic heart disease and other diseases of the circulatory system: Secondary | ICD-10-CM | POA: Insufficient documentation

## 2018-03-21 DIAGNOSIS — Z820 Family history of epilepsy and other diseases of the nervous system: Secondary | ICD-10-CM | POA: Insufficient documentation

## 2018-03-21 DIAGNOSIS — M810 Age-related osteoporosis without current pathological fracture: Secondary | ICD-10-CM | POA: Insufficient documentation

## 2018-03-21 DIAGNOSIS — Z885 Allergy status to narcotic agent status: Secondary | ICD-10-CM | POA: Diagnosis not present

## 2018-03-21 DIAGNOSIS — D539 Nutritional anemia, unspecified: Secondary | ICD-10-CM | POA: Insufficient documentation

## 2018-03-21 DIAGNOSIS — M1990 Unspecified osteoarthritis, unspecified site: Secondary | ICD-10-CM | POA: Insufficient documentation

## 2018-03-21 DIAGNOSIS — C9000 Multiple myeloma not having achieved remission: Secondary | ICD-10-CM | POA: Diagnosis not present

## 2018-03-21 DIAGNOSIS — J439 Emphysema, unspecified: Secondary | ICD-10-CM | POA: Insufficient documentation

## 2018-03-21 LAB — CBC
HCT: 31 % — ABNORMAL LOW (ref 36.0–46.0)
Hemoglobin: 10.9 g/dL — ABNORMAL LOW (ref 12.0–15.0)
MCH: 36.8 pg — AB (ref 26.0–34.0)
MCHC: 35.2 g/dL (ref 30.0–36.0)
MCV: 104.7 fL — ABNORMAL HIGH (ref 78.0–100.0)
PLATELETS: 240 10*3/uL (ref 150–400)
RBC: 2.96 MIL/uL — ABNORMAL LOW (ref 3.87–5.11)
RDW: 16.1 % — AB (ref 11.5–15.5)
WBC: 8.4 10*3/uL (ref 4.0–10.5)

## 2018-03-21 LAB — APTT: aPTT: 31 seconds (ref 24–36)

## 2018-03-21 LAB — PROTIME-INR
INR: 1.11
PROTHROMBIN TIME: 14.3 s (ref 11.4–15.2)

## 2018-03-21 MED ORDER — MIDAZOLAM HCL 2 MG/2ML IJ SOLN
INTRAMUSCULAR | Status: AC | PRN
  Administered 2018-03-21 (×2): 1 mg via INTRAVENOUS

## 2018-03-21 MED ORDER — MIDAZOLAM HCL 2 MG/2ML IJ SOLN
INTRAMUSCULAR | Status: AC
  Filled 2018-03-21: qty 4

## 2018-03-21 MED ORDER — NALOXONE HCL 0.4 MG/ML IJ SOLN
INTRAMUSCULAR | Status: AC
  Filled 2018-03-21: qty 1

## 2018-03-21 MED ORDER — SODIUM CHLORIDE 0.9 % IV SOLN
INTRAVENOUS | Status: DC
  Administered 2018-03-21: 08:00:00 via INTRAVENOUS

## 2018-03-21 MED ORDER — SODIUM CHLORIDE 0.9 % IV SOLN
INTRAVENOUS | Status: AC
  Filled 2018-03-21: qty 250

## 2018-03-21 MED ORDER — FLUMAZENIL 0.5 MG/5ML IV SOLN
INTRAVENOUS | Status: AC
  Filled 2018-03-21: qty 5

## 2018-03-21 MED ORDER — FENTANYL CITRATE (PF) 100 MCG/2ML IJ SOLN
INTRAMUSCULAR | Status: AC | PRN
  Administered 2018-03-21 (×2): 50 ug via INTRAVENOUS

## 2018-03-21 MED ORDER — FENTANYL CITRATE (PF) 100 MCG/2ML IJ SOLN
INTRAMUSCULAR | Status: AC
  Filled 2018-03-21: qty 4

## 2018-03-21 NOTE — Procedures (Signed)
Pre-procedure Diagnosis: Multiple Myeloma Post-procedure Diagnosis: Same  Technically successful CT guided bone marrow aspiration and biopsy of left iliac crest.   Complications: None Immediate  EBL: None  SignedSandi Mariscal Pager: [phone] 03/21/2018, 9:57 AM

## 2018-03-21 NOTE — Discharge Instructions (Addendum)

## 2018-03-21 NOTE — Consult Note (Signed)
Chief Complaint: Patient was seen in consultation today for image guided aspiration of bone marrow  Referring Physician(s): Brunetta Genera  Supervising Physician: Sandi Mariscal  Patient Status: [facility] - Out-pt  History of Present Illness: Cassidy Barrett is a 72 y.o. female with a past medical history of cirrhosis, COPD, osteoporosis, anemia and multiple myeloma. Recently presented to GI for macrocytic anemia with 2 point drop in hemoglobin - NSAIDs were stopped, PPI was started. On follow up evaluation hemoglobin had dropped another point. She presents today for bone marrow biopsy which she reports is because her smoldering multiple myeloma is now multiple myeloma per her oncologist. She denies any complaints today, reports last PO intake was last night, she is not on any blood thinners and she did not take any medications this morning.   Past Medical History:  Diagnosis Date  . Alcohol abuse   . Arthritis   . Ascites   . Atherosclerosis of aorta ([place])   . Cholelithiasis   . Cirrhosis ([place])   . Complication of anesthesia    Difficult time waking up after having biospy with colonoscopy  . Compression fracture of lumbar vertebra ([facility])     L1 and L2  . Emphysema of lung ([place])   . History of multiple myeloma    smoldering  . Hyponatremia   . Macrocytic anemia   . 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 Osteoporosis   . Pneumonia 04/2014  . Tobacco abuse   . Tubular adenoma   . Vertebral compression fracture [facility])     Past Surgical History:  Procedure Laterality Date  . COLONOSCOPY N/A 05/24/2014   Procedure: COLONOSCOPY;  Surgeon: Gatha Mayer, MD;  Location: [facility];  Service: Endoscopy;  Laterality: N/A;  MAC if possible ultraslim colonoscope and gastroscope needed  . FLEXIBLE SIGMOIDOSCOPY N/A 05/21/2014   Procedure: FLEXIBLE SIGMOIDOSCOPY;  Surgeon: Gatha Mayer, MD;  Location: [facility];  Service: Endoscopy;  Laterality: N/A;  . KYPHOPLASTY N/A 12/12/2014   Procedure: KYPHOPLASTY;  Surgeon: Phylliss Bob, MD;  Location: [facility];  Service: Orthopedics;  Laterality: N/A;  T10 kyphoplasty  . MANDIBLE FRACTURE SURGERY     X 2  . TONSILLECTOMY      Allergies: Bee venom; Benadryl [diphenhydramine]; Hydrocodone-acetaminophen; Oxycodone; Tape; and Valium [diazepam]  Medications: Prior to Admission medications   Medication Sig Start Date End Date Taking? Authorizing Provider  albuterol (PROVENTIL HFA;VENTOLIN HFA) 108 (90 Base) MCG/ACT inhaler INHALE 2 PUFFS INTO THE LUNGS EVERY 6 HOURS AS NEEDED FOR WHEEZING OR SHORTNESS OF BREATH 02/10/18  Yes Tanda Rockers, MD  ANORO ELLIPTA 62.5-25 MCG/INH AEPB INHALE 1 PUFF INTO THE LUNGS DAILY 01/04/18  Yes Tanda Rockers, MD  calcitonin, salmon, (MIACALCIN/FORTICAL) 200 UNIT/ACT nasal spray U 1 SPRAY NASALLY D. ALTERNATING NOSTRILS 07/30/17  Yes [provider]  folic acid (FOLVITE) 1 MG tablet Take 1 tablet (1 mg total) by mouth daily. 02/24/18  Yes Willia Craze, NP  furosemide (LASIX) 20 MG tablet Take 0.5 tablets (10 mg total) by mouth daily. 03/07/18  Yes Gatha Mayer, MD  lidocaine (LIDODERM) 5 % Place 1-2 patches onto the skin daily as needed (severe pain). Remove & Discard patch within 12 hours or as directed by MD   Yes [provider]  magnesium oxide (MAG-OX) 400 (241.3 Mg) MG tablet Take 1 tablet (400 mg total) by mouth daily. 03/07/18  Yes Gatha Mayer, MD  Menthol, Topical Analgesic, (BIOFREEZE EX) Apply topically as directed.  Yes [provider]  OVER THE COUNTER MEDICATION Apply 400 mg topically daily as needed (arthritis/back pain). CBD cream   Yes [provider]  pantoprazole (PROTONIX) 40 MG tablet Take 1 tablet (40 mg total) by mouth daily. 02/01/18  Yes Willia Craze, NP  potassium chloride (KLOR-CON) 20 MEQ packet Take 40 mEq by mouth 2 (two) times daily. 03/09/18  Yes Gatha Mayer, MD  spironolactone (ALDACTONE) 50 MG tablet Take 1 tablet (50 mg  total) by mouth daily. 03/07/18  Yes Gatha Mayer, MD     Family History  Problem Relation Age of Onset  . Heart disease Mother   . Other Father        brain tumor  . COPD Sister   . ALS Brother   . Colon cancer Neg Hx   . Breast cancer Neg Hx   . Stomach cancer Neg Hx   . Esophageal cancer Neg Hx   . Rectal cancer Neg Hx     Social History   Socioeconomic History  . Marital status: Married    Spouse name: Not on file  . Number of children: Not on file  . Years of education: Not on file  . Highest education level: Not on file  Occupational History  . Occupation: retired -[employer]  Social Needs  . Financial resource strain: Not on file  . Food insecurity:    Worry: Not on file    Inability: Not on file  . Transportation needs:    Medical: Not on file    Non-medical: Not on file  Tobacco Use  . Smoking status: Former Smoker    Packs/day: 0.25    Years: 46.00    Pack years: 11.50    Types: Cigarettes    Last attempt to quit: 02/24/2017    Years since quitting: 1.0  . Smokeless tobacco: Never Used  Substance and Sexual Activity  . Alcohol use: Yes    Alcohol/week: 0.0 standard drinks    Comment: 1-4 alcoholic beverages daily  . Drug use: No  . Sexual activity: Not on file  Lifestyle  . Physical activity:    Days per week: Not on file    Minutes per session: Not on file  . Stress: Not on file  Relationships  . Social connections:    Talks on phone: Not on file    Gets together: Not on file    Attends religious service: Not on file    Active member of club or organization: Not on file    Attends meetings of clubs or organizations: Not on file    Relationship status: Not on file  Other Topics Concern  . Not on file  Social History Narrative  . Not on file     Review of Systems: A 12 point ROS discussed and pertinent positives are indicated in the HPI above.  All other systems are negative.  Review of Systems  Constitutional: Negative for  chills and fever.  Respiratory: Negative for cough and shortness of breath.   Cardiovascular: Positive for leg swelling. Negative for chest pain.  Gastrointestinal: Positive for abdominal distention. Negative for abdominal pain, diarrhea, nausea and vomiting.  Musculoskeletal: Positive for back pain.  Skin: Negative for rash.    Vital Signs: Pulse (!) 115 Comment: Pt anxious  Physical Exam  Constitutional: She is oriented to person, place, and time. No distress.  HENT:  Head: Normocephalic.  Cardiovascular: Normal rate, regular rhythm and normal heart sounds.  Pulmonary/Chest: Effort  normal.  Abdominal: Soft. Bowel sounds are normal. She exhibits distension. There is no tenderness.  Neurological: She is alert and oriented to person, place, and time.  Skin: Skin is warm and dry. She is not diaphoretic.  Psychiatric: She has a normal mood and affect. Her behavior is normal. Judgment and thought content normal.  Nursing note reviewed.    MD Evaluation Airway: WNL Heart: WNL Abdomen: WNL Chest/ Lungs: WNL ASA  Classification: 3 Mallampati/Airway Score: Two   Imaging: Dg Bone Survey Met  Result Date: 02/28/2018 CLINICAL DATA:  Multiple myeloma EXAM: METASTATIC BONE SURVEY COMPARISON:  05/12/2016 FINDINGS: Lateral view of the skull was obtained and reveals no focal lytic lesions. Views of the cervical spine reveal degenerative change without lytic lesion. There are changes consistent with compression fractures at T11-T12 and L1 with prior augmentation at T12. These are stable from the prior exam. There is a significant compression at T7 new from the prior exam. Multilevel osteophytic changes are seen. Lumbar spine demonstrates compression deformities of L1, L4 and L5 which appear stable from the previous exam. No new compression fracture is seen. The pelvis demonstrates an intact pelvic ring. No definitive lytic lesions are seen. The lower extremities are well visualized bilaterally as  are the upper extremities and no definitive lytic lesions are noted. IMPRESSION: New T7 compression deformity. Multiple chronic compression deformities are noted. No definitive lytic lesions are identified. Electronically Signed   By: Inez Catalina M.D.   On: 02/28/2018 13:37    Labs:  CBC: Recent Labs    02/01/18 1317 02/18/18 1309 02/28/18 1014 03/21/18 0749  WBC 9.7 5.6 5.9 8.4  HGB 10.0* 9.7* 9.7* 10.9*  HCT 29.4* 29.3* 29.3* 31.0*  PLT 277 226.0 241 240    COAGS: Recent Labs    03/02/18 1708  INR 1.4*    BMP: Recent Labs    08/13/17 1026 09/27/17 1234 02/01/18 1317 02/18/18 1309 02/21/18 1628 02/28/18 1014 03/02/18 1708  NA 131* 134* 129* 129* 129* 131* 131*  K 3.6 3.9 3.5 2.6 Repeated and verified X2.* 4.6 4.3 3.7  CL 98* 96* 91* 87* 99 99 99  CO2 _0 GLUCOSE 151* 115 139* 144* 101* 120* 90  BUN 7 6* 3* 7 6 6* 7  CALCIUM 9.0 8.9 7.8* 7.2* 7.9* 7.9* 7.3*  CREATININE 0.50 0.61 0.64 0.55 0.48 0.66 0.43  GFRNONAA >60 >60 >60  --   --  >60  --   GFRAA >60 >60 >60  --   --  >60  --     LIVER FUNCTION TESTS: Recent Labs    05/31/17 1314 09/27/17 1234 02/01/18 1317 02/28/18 1014  BILITOT 0.68 2.1* 3.5* 1.5*  AST 38* 82* 106* 51*  ALT 21 34 26 14  ALKPHOS 234* 709* 450* 183*  PROT 8.7* 7.8 8.1 8.6*  ALBUMIN 3.7 2.6* 1.5* 1.4*    TUMOR MARKERS: Recent Labs    02/18/18 1309  AFPTM 2.9    Assessment and Plan:  Patient with worsening anemia and previously smoldering multiple myeloma followed by Dr. Irene Limbo now thought to be multiple myeloma, request for bone marrow biopsy in IR for further evaluation. WBC 8.4, H/H 10.9/31.0, INR 1.11 on exam today.  Risks and benefits discussed with the patient including, but not limited to bleeding, infection, damage to adjacent structures or low yield requiring additional tests.  All of the patient's questions were answered, patient is agreeable to proceed. Consent signed and in chart.  Thank  you for this interesting consult.  I greatly enjoyed meeting Cassidy Barrett and look forward to participating in their care.  A copy of this report was sent to the requesting provider on this date.  Electronically Signed: Joaquim Nam, PA-C 03/21/2018, 8:32 AM   I spent a total of  40 Minutes  in face to face in clinical consultation, greater than 50% of which was counseling/coordinating care for bone marrow biopsy.

## 2018-03-22 ENCOUNTER — Other Ambulatory Visit: Payer: Self-pay | Admitting: Hematology

## 2018-03-22 MED ORDER — ERGOCALCIFEROL 1.25 MG (50000 UT) PO CAPS: 50000.0000 [IU] | ORAL_CAPSULE | ORAL | 2 refills | Status: AC

## 2018-03-23 ENCOUNTER — Encounter: Payer: Self-pay | Admitting: Internal Medicine

## 2018-03-23 DIAGNOSIS — I700 Atherosclerosis of aorta: Secondary | ICD-10-CM | POA: Diagnosis not present

## 2018-03-23 DIAGNOSIS — E871 Hypo-osmolality and hyponatremia: Secondary | ICD-10-CM | POA: Diagnosis not present

## 2018-03-23 DIAGNOSIS — C9000 Multiple myeloma not having achieved remission: Secondary | ICD-10-CM | POA: Diagnosis not present

## 2018-03-23 DIAGNOSIS — K7460 Unspecified cirrhosis of liver: Secondary | ICD-10-CM | POA: Diagnosis not present

## 2018-03-23 DIAGNOSIS — B199 Unspecified viral hepatitis without hepatic coma: Secondary | ICD-10-CM | POA: Diagnosis not present

## 2018-03-23 DIAGNOSIS — E876 Hypokalemia: Secondary | ICD-10-CM | POA: Diagnosis not present

## 2018-03-24 ENCOUNTER — Ambulatory Visit (HOSPITAL_COMMUNITY)
Admission: RE | Admit: 2018-03-24 | Discharge: 2018-03-24 | Disposition: A | Discharge status: Home / Self Care | Payer: Medicare Other | Source: Ambulatory Visit | Attending: Hematology | Admitting: Hematology

## 2018-03-24 DIAGNOSIS — C9000 Multiple myeloma not having achieved remission: Secondary | ICD-10-CM | POA: Insufficient documentation

## 2018-03-24 DIAGNOSIS — J439 Emphysema, unspecified: Secondary | ICD-10-CM | POA: Diagnosis not present

## 2018-03-24 DIAGNOSIS — I700 Atherosclerosis of aorta: Secondary | ICD-10-CM | POA: Insufficient documentation

## 2018-03-24 LAB — GLUCOSE, CAPILLARY: Glucose-Capillary: 115 mg/dL — ABNORMAL HIGH (ref 70–99)

## 2018-03-24 MED ORDER — FLUDEOXYGLUCOSE F - 18 (FDG) INJECTION
5.3000 | Freq: Once | INTRAVENOUS | Status: AC | PRN
  Administered 2018-03-24: 5.3 via INTRAVENOUS

## 2018-03-25 ENCOUNTER — Ambulatory Visit: Payer: Self-pay | Admitting: Internal Medicine

## 2018-03-25 DIAGNOSIS — C9000 Multiple myeloma not having achieved remission: Secondary | ICD-10-CM | POA: Diagnosis not present

## 2018-03-25 NOTE — Progress Notes (Signed)
Marland Kitchen    HEMATOLOGY/ONCOLOGY CLINIC NOTE  Date of Service: 03/29/18     Patient Care Team: Lanice Shirts, MD as PCP - General (Internal Medicine) Jeralene Peters, DDS as Dietitian (Periodontics)  Orthopedics - Dr. Layne Benton from [facility]   CHIEF COMPLAINTS/PURPOSE OF CONSULTATION:   F/u for newly diagnosed multiple myeloma  HISTORY OF PRESENTING ILLNESS:   Cassidy Barrett is a wonderful 72 y.o. female who has been referred to [place] by Dr Wandra Feinstein for evaluation and management of possible plasma cell dyscrasia.  Patient has a history of liver cirrhosis with ascites, alcohol abuse, emphysema, macrocytic anemia, osteoporosis and was seen by orthopedics for spinal vertebral compression fractures in the setting of osteoporosis.  She was noted to have multiple significant lab abnormalities including an elevated alkaline phosphatase and high total protein level.  Chronic appealing L1-L2 compression fractures  Patient subsequently had a bone scan to evaluate these compression fractures on 09/26/2015.  This showed mild increased activity within the superior endplates of L3, L4 and L5 likely representing subacute fractures. Increased activity within the T2 vertebral body compatible with known compression fracture. No evidence of suggest bony metastatic disease.  As a part of her workup she also had an SPEP done which shows 3 monoclonal protein bands for a total M protein of 2.3 g/dL. On IFE this is noted to be monoclonal IgA kappa protein. Her sedimentation rate was also elevated at 73.  She was referred to [place] for further evaluation to rule out multiple myeloma. Patient notes her mid and lower back pain related to her compression fractures. Also notes some left hip pain. Her labs from January 2017 did not show any anemia, hypercalcemia or renal failure.  She notes that she did have a colonoscopy in 2015 which demonstrated a 4.5 cm rectal polyp which was  removed piecemeal. Pathology showed a tubulovillous adenoma. Patient was also noted to have significant diverticulosis and was not recommended routine colonoscopies. Given her CT scan showing concern of rectal mass she was referred back to Dr. Henrene Pastor at [place] for consideration of a sigmoidoscopy/colonoscopy. Patient notes no overt rectal bleeding. No obstructive symptoms. No change in bowel habits. No new abdominal pain. She also needs to connect with gi for management of her liver cirrhosis and related issues.   INTERVAL HISTORY:  Cassidy Barrett is here for her scheduled followup for newly diagnosed myeloma. The patient's last visit with [place] was on 03/15/18. She is accompanied today by her friend. The pt reports that she is doing well overall.   The pt notes that she has noticed worsened vision, noting that books have been more blurry recently. The pt notes that her eyes do not feel itchy, nor burn, and have not been dry.   In conversation about treatment options like Velcade, the pt reports that she has never had neuropathy.  She also notes that she has been more tired recently, but is doing her best to eat better. She denies any dental concerns at the time.  Of note since the patient's last visit, pt has had a PET/CT completed on 03/25/18 with results revealing No evidence of active multiple myeloma within the skeleton. 2. No soft tissue mass to suggest plasmacytoma. 3. Aortic Atherosclerosis and Emphysema.  Her 03/21/18 BM Bx revealed 30% atypical plasma cells consistent with myeloma  Lab results today (03/29/18) of CBC w/diff, CMP is as follows: all values are WNL except for RBC at 2.72, HGB at 9.6, HCT  at 28.5, MCV at 104.8, MCH at 35.3, RDW at 17.4, Sodium at 130, Chloride at 93, Glucose at 115, Calcium at 8.2, Total Protein at 8.4, Albumin at 1.5, AST at 65, Alk Phos at 274, Total Bilirubin at 1.5. 03/29/18 MMP is M< spike of 3.7  On review of systems, pt reports worsened vision, eating  better, feeling tired, and denies eye pain, eye discharge, eye redness, dental issues, abdominal pains, leg swelling, skin rash, and any other symptoms.   MEDICAL HISTORY:  Past Medical History:  Diagnosis Date  . Alcohol abuse   . Arthritis   . Ascites   . Atherosclerosis of aorta ([place])   . Cholelithiasis   . Cirrhosis ([place])   . Complication of anesthesia    Difficult time waking up after having biospy with colonoscopy  . Compression fracture of lumbar vertebra (HCC)     L1 and L2  . Emphysema of lung ([place])   . History of multiple myeloma    smoldering  . Hyponatremia   . Macrocytic anemia   . 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 Osteoporosis   . Pneumonia 04/2014  . Tobacco abuse   . Tubular adenoma   . Vertebral compression fracture (H[facility])    ?? 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  SURGICAL HISTORY: Past Surgical History:  Procedure Laterality Date  . COLONOSCOPY N/A 05/24/2014   Procedure: COLONOSCOPY;  Surgeon: Gatha Mayer, MD;  Location: [facility];  Service: Endoscopy;  Laterality: N/A;  MAC if possible ultraslim colonoscope and gastroscope needed  . FLEXIBLE SIGMOIDOSCOPY N/A 05/21/2014   Procedure: FLEXIBLE SIGMOIDOSCOPY;  Surgeon: Gatha Mayer, MD;  Location: [facility];  Service: Endoscopy;  Laterality: N/A;  . KYPHOPLASTY N/A 12/12/2014   Procedure: KYPHOPLASTY;  Surgeon: Phylliss Bob, MD;  Location: [place];  Service: Orthopedics;  Laterality: N/A;  T10 kyphoplasty  . MANDIBLE FRACTURE SURGERY     X 2  . TONSILLECTOMY      SOCIAL HISTORY: Social History   Socioeconomic History  . Marital status: Married    Spouse name: Not on file  . Number of children: Not on file  . Years of education: Not on file  . Highest education level: Not on file  Occupational History  . Occupation: retired -[employer]  Social Needs  . Financial resource strain: Not on file  . Food insecurity:    Worry: Not on file    Inability:  Not on file  . Transportation needs:    Medical: Not on file    Non-medical: Not on file  Tobacco Use  . Smoking status: Former Smoker    Packs/day: 0.25    Years: 46.00    Pack years: 11.50    Types: Cigarettes    Last attempt to quit: 02/24/2017    Years since quitting: 1.0  . Smokeless tobacco: Never Used  Substance and Sexual Activity  . Alcohol use: Yes    Alcohol/week: 0.0 standard drinks    Comment: 1-4 alcoholic beverages daily  . Drug use: No  . Sexual activity: Not on file  Lifestyle  . Physical activity:    Days per week: Not on file    Minutes per session: Not on file  . Stress: Not on file  Relationships  . Social connections:    Talks on phone: Not on file    Gets together: Not on file    Attends religious service: Not on file    Active member of club or  organization: Not on file    Attends meetings of clubs or organizations: Not on file    Relationship status: Not on file  . Intimate partner violence:    Fear of current or ex partner: Not on file    Emotionally abused: Not on file    Physically abused: Not on file    Forced sexual activity: Not on file  Other Topics Concern  . Not on file  Social History Narrative  . Not on file   Patient notes that she is drinking about 1 beer and 1 hard liquor every day - was counseled regarding absolute alcohol abstinence.  FAMILY HISTORY:  No family history of colon cancer.  ALLERGIES:  is allergic to bee venom; benadryl [diphenhydramine]; hydrocodone-acetaminophen; oxycodone; tape; and valium [diazepam].  MEDICATIONS:  Current Outpatient Medications  Medication Sig Dispense Refill  . albuterol (PROVENTIL HFA;VENTOLIN HFA) 108 (90 Base) MCG/ACT inhaler INHALE 2 PUFFS INTO THE LUNGS EVERY 6 HOURS AS NEEDED FOR WHEEZING OR SHORTNESS OF BREATH 18 g 1  . ANORO ELLIPTA 62.5-25 MCG/INH AEPB INHALE 1 PUFF INTO THE LUNGS DAILY 60 each 11  . calcitonin, salmon, (MIACALCIN/FORTICAL) 200 UNIT/ACT nasal spray U 1 SPRAY  NASALLY D. ALTERNATING NOSTRILS  3  . ergocalciferol (VITAMIN D2) 50000 units capsule Take 1 capsule (50,000 Units total) by mouth once a week. 12 capsule 2  . folic acid (FOLVITE) 1 MG tablet Take 1 tablet (1 mg total) by mouth daily. 30 tablet 3  . furosemide (LASIX) 20 MG tablet Take 0.5 tablets (10 mg total) by mouth daily. 30 tablet 1  . lidocaine (LIDODERM) 5 % Place 1-2 patches onto the skin daily as needed (severe pain). Remove & Discard patch within 12 hours or as directed by MD    . magnesium oxide (MAG-OX) 400 (241.3 Mg) MG tablet Take 1 tablet (400 mg total) by mouth daily. 30 tablet 0  . Menthol, Topical Analgesic, (BIOFREEZE EX) Apply topically as directed.    Marland Kitchen OVER THE COUNTER MEDICATION Apply 400 mg topically daily as needed (arthritis/back pain). CBD cream    . pantoprazole (PROTONIX) 40 MG tablet Take 1 tablet (40 mg total) by mouth daily. 90 tablet 1  . potassium chloride (KLOR-CON) 20 MEQ packet Take 40 mEq by mouth 2 (two) times daily. 180 tablet 0  . spironolactone (ALDACTONE) 50 MG tablet Take 1 tablet (50 mg total) by mouth daily. 30 tablet 0   No current facility-administered medications for this visit.     REVIEW OF SYSTEMS:    A 10+ POINT REVIEW OF SYSTEMS WAS OBTAINED including neurology, dermatology, psychiatry, cardiac, respiratory, lymph, extremities, GI, GU, Musculoskeletal, constitutional, breasts, reproductive, HEENT.  All pertinent positives are noted in the HPI.  All others are negative.   PHYSICAL EXAMINATION: ECOG PERFORMANCE STATUS: 2 - Symptomatic, <50% confined to bed  Vitals:   03/29/18 1420  BP: (!) 92/51  Pulse: (!) 120  Resp: 18  Temp: 98.2 F (36.8 C)  SpO2: 91%   Filed Weights   03/29/18 1420  Weight: 74 lb 8 oz (33.8 kg)   .Body mass index is 15.05 kg/m.   GENERAL:alert, in no acute distress and comfortable SKIN: no acute rashes, no significant lesions EYES: conjunctiva are pink and non-injected, sclera anicteric OROPHARYNX:  MMM, no exudates, no oropharyngeal erythema or ulceration NECK: supple, no JVD LYMPH:  no palpable lymphadenopathy in the cervical, axillary or inguinal regions LUNGS: clear to auscultation b/l with normal respiratory effort HEART: regular rate & rhythm  ABDOMEN:  normoactive bowel sounds , non tender, not distended. No palpable hepatosplenomegaly.  Extremity: no pedal edema PSYCH: alert & oriented x 3 with fluent speech NEURO: no focal motor/sensory deficits   LABORATORY DATA:  I have reviewed the data as listed  . CBC Latest Ref Rng & Units 03/29/2018 03/21/2018 02/28/2018  WBC 3.9 - 10.3 K/uL 6.5 8.4 5.9  Hemoglobin 11.6 - 15.9 g/dL 9.6(L) 10.9(L) 9.7(L)  Hematocrit 34.8 - 46.6 % 28.5(L) 31.0(L) 29.3(L)  Platelets 145 - 400 K/uL 186 240 241    . CMP Latest Ref Rng & Units 03/29/2018 03/02/2018 02/28/2018  Glucose 70 - 99 mg/dL 115(H) 90 120(H)  BUN 8 - 23 mg/dL 8 7 6(L)  Creatinine 0.44 - 1.00 mg/dL 0.68 0.43 0.66  Sodium 135 - 145 mmol/L 130(L) 131(L) 131(L)  Potassium 3.5 - 5.1 mmol/L 4.2 3.7 4.3  Chloride 98 - 111 mmol/L 93(L) 99 99  CO2 22 - 32 mmol/L _0 Calcium 8.9 - 10.3 mg/dL 8.2(L) 7.3(L) 7.9(L)  Total Protein 6.5 - 8.1 g/dL 8.4(H) - 8.6(H)  Total Bilirubin 0.3 - 1.2 mg/dL 1.5(H) - 1.5(H)  Alkaline Phos 38 - 126 U/L 274(H) - 183(H)  AST 15 - 41 U/L 65(H) - 51(H)  ALT 0 - 44 U/L 15 - 14      Component     Latest Ref Rng & Units 02/18/2018 03/02/2018  TIBC     250 - 450 ug/dL  127 (L)  UIBC     118 - 369 ug/dL  18 (L)  Iron     27 - 139 ug/dL  109  Iron Saturation     15 - 55 %  86 (HH)  Hepatitis C Ab     NON-REACTI  NON-REACTIVE  SIGNAL TO CUT-OFF     <1.00  0.02  AFP Tumor Marker     ng/mL 2.9   Hepatitis A AB,Total     NON-REACTI  NON-REACTIVE  Hep B S Ab     NON-REACTI  NON-REACTIVE  Hepatitis B Surface Ag     NON-REACTI  NON-REACTIVE   . Lab Results  Component Value Date   IRON 109 03/02/2018   TIBC 127 (L) 03/02/2018   IRONPCTSAT 86 (HH)  03/02/2018   (Iron and TIBC)  Lab Results  Component Value Date   FERRITIN >1500.0 (H) 02/18/2018    10/16/15 BM Bx:   03/21/18 BM Bx:    RADIOGRAPHIC STUDIES: I have personally reviewed the radiological images as listed and agreed with the findings in the report. Nm Pet Image Initial (pi) Skull Base To Thigh  Result Date: 03/25/2018 CLINICAL DATA: INITIAL: CLINICAL DATA: INITIAL Initial treatment strategy for multiple myeloma. EXAM: NUCLEAR MEDICINE PET SKULL BASE TO THIGH TECHNIQUE: 5.3 mCi F-18 FDG was injected intravenously. Full-ring PET imaging was performed from the skull base to thigh after the radiotracer. CT data was obtained and used for attenuation correction and anatomic localization. Fasting blood glucose: 115 mg/dl COMPARISON:  None. FINDINGS: Mediastinal blood pool activity: SUV max 1.6 NECK: No hypermetabolic lymph nodes in the neck. Incidental CT findings: none CHEST: No hypermetabolic mediastinal or hilar nodes. No suspicious pulmonary nodules on the CT scan. Incidental CT findings: Severe upper lobe emphysema. ABDOMEN/PELVIS: No abnormal hypermetabolic activity within the liver, pancreas, adrenal glands, or spleen. No hypermetabolic lymph nodes in the abdomen or pelvis. Incidental CT findings: Nephrocalcinosis noted. Atherosclerotic calcification of the aorta. SKELETON: No focal metabolic activity within the skeleton to localize active multiple  myeloma. No plasmacytoma identified. Incidental CT findings: Kyphoplasty noted in the T12 vertebral body with focal kyphosis. IMPRESSION: 1. No evidence of active multiple myeloma within the skeleton. 2. No soft tissue mass to suggest plasmacytoma. 3. Aortic Atherosclerosis (ICD10-I70.0) and Emphysema (ICD10-J43.9). Electronically Signed   By: Suzy Bouchard M.D.   On: 03/25/2018 11:34   Ct Biopsy  Result Date: 03/21/2018 INDICATION: History of multiple myeloma. Please perform CT-guided bone marrow biopsy for tissue diagnostic  purposes. EXAM: CT-GUIDED BONE MARROW BIOPSY AND ASPIRATION MEDICATIONS: None ANESTHESIA/SEDATION: Fentanyl 100 mcg IV; Versed 2 mg IV Sedation Time: 13 Minutes; The patient was continuously monitored during the procedure by the interventional radiology nurse under my direct supervision. COMPLICATIONS: None immediate. PROCEDURE: Informed consent was obtained from the patient following an explanation of the procedure, risks, benefits and alternatives. The patient understands, agrees and consents for the procedure. All questions were addressed. A time out was performed prior to the initiation of the procedure. The patient was positioned left lateral decubitus and non-contrast localization CT was performed of the pelvis to demonstrate the iliac marrow spaces. The operative site was prepped and draped in the usual sterile fashion. Under sterile conditions and local anesthesia, a 22 gauge spinal needle was utilized for procedural planning. Next, an 11 gauge coaxial bone biopsy needle was advanced into the left iliac marrow space. Needle position was confirmed with CT imaging. Initially, bone marrow aspiration was performed. Next, a bone marrow biopsy was obtained with the 11 gauge outer bone marrow device. The 11 gauge coaxial bone biopsy needle was re-advanced into a slightly different location within the left iliac marrow space, positioning was confirmed and an additional bone marrow biopsy was obtained. Samples were prepared with the cytotechnologist and deemed adequate. The needle was removed intact. Hemostasis was obtained with compression and a dressing was placed. The patient tolerated the procedure well without immediate post procedural complication. IMPRESSION: Successful CT guided left iliac bone marrow aspiration and core biopsy. Electronically Signed   By: Sandi Mariscal M.D.   On: 03/21/2018 11:15   Dg Bone Survey Met  Result Date: 02/28/2018 CLINICAL DATA:  Multiple myeloma EXAM: METASTATIC BONE SURVEY  COMPARISON:  05/12/2016 FINDINGS: Lateral view of the skull was obtained and reveals no focal lytic lesions. Views of the cervical spine reveal degenerative change without lytic lesion. There are changes consistent with compression fractures at T11-T12 and L1 with prior augmentation at T12. These are stable from the prior exam. There is a significant compression at T7 new from the prior exam. Multilevel osteophytic changes are seen. Lumbar spine demonstrates compression deformities of L1, L4 and L5 which appear stable from the previous exam. No new compression fracture is seen. The pelvis demonstrates an intact pelvic ring. No definitive lytic lesions are seen. The lower extremities are well visualized bilaterally as are the upper extremities and no definitive lytic lesions are noted. IMPRESSION: New T7 compression deformity. Multiple chronic compression deformities are noted. No definitive lytic lesions are identified. Electronically Signed   By: Inez Catalina M.D.   On: 02/28/2018 13:37   Ct Bone Marrow Biopsy & Aspiration  Result Date: 03/21/2018 INDICATION: History of multiple myeloma. Please perform CT-guided bone marrow biopsy for tissue diagnostic purposes. EXAM: CT-GUIDED BONE MARROW BIOPSY AND ASPIRATION MEDICATIONS: None ANESTHESIA/SEDATION: Fentanyl 100 mcg IV; Versed 2 mg IV Sedation Time: 13 Minutes; The patient was continuously monitored during the procedure by the interventional radiology nurse under my direct supervision. COMPLICATIONS: None immediate. PROCEDURE: Informed consent was obtained  from the patient following an explanation of the procedure, risks, benefits and alternatives. The patient understands, agrees and consents for the procedure. All questions were addressed. A time out was performed prior to the initiation of the procedure. The patient was positioned left lateral decubitus and non-contrast localization CT was performed of the pelvis to demonstrate the iliac marrow spaces. The  operative site was prepped and draped in the usual sterile fashion. Under sterile conditions and local anesthesia, a 22 gauge spinal needle was utilized for procedural planning. Next, an 11 gauge coaxial bone biopsy needle was advanced into the left iliac marrow space. Needle position was confirmed with CT imaging. Initially, bone marrow aspiration was performed. Next, a bone marrow biopsy was obtained with the 11 gauge outer bone marrow device. The 11 gauge coaxial bone biopsy needle was re-advanced into a slightly different location within the left iliac marrow space, positioning was confirmed and an additional bone marrow biopsy was obtained. Samples were prepared with the cytotechnologist and deemed adequate. The needle was removed intact. Hemostasis was obtained with compression and a dressing was placed. The patient tolerated the procedure well without immediate post procedural complication. IMPRESSION: Successful CT guided left iliac bone marrow aspiration and core biopsy. Electronically Signed   By: Sandi Mariscal M.D.   On: 03/21/2018 11:15     ASSESSMENT & PLAN:   72 y.o. Caucasian female with severe osteoporosis and vertebral compression fractures  #1 IgA kappa Smoldering Multiple myeloma    M protein level increased significantly to 3 ( 1.5-->1.8 ---> 1.9-->1.8-->1.7--> 3-->3.4-->3.7 Noted to have new anemia with a hgb of 10 Labs with no signs of  hypercalcemia or renal failure. No overt focal new bone pains. Bm Bx shows 21% abnormal plasma cells. Rpt Bone Survey on 05/12/2016 -- showed no evidence of lytic lesions.  02/28/18 Bone Survey which revealed New T7 compression deformity. Multiple chronic compression deformities are noted. No definitive lytic lesions are identified.   PLAN: -Previously discussed my concern that the patient's M Protein doubling in the last five months to 3.4, IgA increasing to 5.28g, and HGB decreasing to 9.7 are indicative towards progression to Multiple  Myeloma -Will hold Calcitonin spray -Discussed pt labwork today, 03/29/18; hyponatremia with sodium at 130, worsened anemia at 9.6 -Discussed the 03/25/18 PET/CT which revealed No evidence of active multiple myeloma within the skeleton. 2. No soft tissue mass to suggest plasmacytoma. 3. Aortic Atherosclerosis and Emphysema. -Discussed the 03/21/18 BM Bx which revealed 30% atypical plasma cells -Bone directed therapy indicated by osteoporosis, no current dental concerns  -Discussed that triple drug therapy might be difficult to tolerate with concern for her poor liver functions -Discussed that her worsening blood counts are indicative towards considering treatment of her multiple myeloma  -Will treat with dose reduced Dexamethasone to 59m and dose reduced Velcade within the next week -Discussed with the pt that her condition is treatable but not curable -Discussed that with Velcade treatment we will monitor for neuropathy and shingles outbreak -Will also order Acyclovir -Will set pt up for chemotherapy counseling -Discussed that I would not recommend a BM transplant but that if the [facility] for a second opinion  -Advised that the pt return to care with her previously seen eye doctor -Continue taking Vitamin D, and Vitamin B complex -Provided supplemental information about Velcade and MMP -Have sent supportive medications to her pharmacy  #2 liver cirrhosis with known varices. Increased bilirubin  levels. Plan -Counseled on absolute alcohol cessation. -continue f/u with PCP  #3 osteoporosis with vertebral compression fractures.  Plan  -Patient has been following with orthopedics Dr. Wandra Feinstein to manage her osteoporosis . -continue aggressive vitamin D replacement . -Pt has had full dental extraction and is recovering well.  -mx per orthopedics.   Chemo-counseling for Velcade/Dexamethasone for Myeloma Schedule patient to  start Velcade /Dexamethasone in 1 week (weekly doses) with labs Xgeva RTC with Dr Irene Limbo 1 week after starting with labs for toxicity check  All of the patients and her female partners questions were answered to their apparent satisfaction. The patient knows to call the clinic with any problems, questions or concerns.  The total time spent in the appt was 40 minutes and more than 50% was on counseling and direct patient cares.      Sullivan Lone MD MS AAHIVMS [facility] Hematology/Oncology Physician [facility]  (Office):       [phone] (Work cell):  [phone] (Fax):           [phone]  I, Baldwin Jamaica, am acting as a scribe for Dr. Irene Limbo  .I have reviewed the above documentation for accuracy and completeness, and I agree with the above. Brunetta Genera MD

## 2018-03-29 ENCOUNTER — Inpatient Hospital Stay: Payer: Medicare Other | Attending: Hematology

## 2018-03-29 ENCOUNTER — Inpatient Hospital Stay (HOSPITAL_BASED_OUTPATIENT_CLINIC_OR_DEPARTMENT_OTHER): Payer: Medicare Other | Admitting: Hematology

## 2018-03-29 VITALS — BP 92/51 | HR 120 | Temp 98.2°F | Resp 18 | Ht 59.0 in | Wt 74.5 lb

## 2018-03-29 DIAGNOSIS — K7460 Unspecified cirrhosis of liver: Secondary | ICD-10-CM | POA: Insufficient documentation

## 2018-03-29 DIAGNOSIS — C9000 Multiple myeloma not having achieved remission: Secondary | ICD-10-CM | POA: Insufficient documentation

## 2018-03-29 DIAGNOSIS — Z5112 Encounter for antineoplastic immunotherapy: Secondary | ICD-10-CM | POA: Diagnosis not present

## 2018-03-29 DIAGNOSIS — M810 Age-related osteoporosis without current pathological fracture: Secondary | ICD-10-CM | POA: Insufficient documentation

## 2018-03-29 DIAGNOSIS — D649 Anemia, unspecified: Secondary | ICD-10-CM

## 2018-03-29 DIAGNOSIS — E871 Hypo-osmolality and hyponatremia: Secondary | ICD-10-CM | POA: Diagnosis not present

## 2018-03-29 LAB — CBC WITH DIFFERENTIAL/PLATELET
BASOS PCT: 1 %
Basophils Absolute: 0 10*3/uL (ref 0.0–0.1)
EOS ABS: 0.1 10*3/uL (ref 0.0–0.5)
Eosinophils Relative: 2 %
HEMATOCRIT: 28.5 % — AB (ref 34.8–46.6)
Hemoglobin: 9.6 g/dL — ABNORMAL LOW (ref 11.6–15.9)
Lymphocytes Relative: 17 %
Lymphs Abs: 1.1 10*3/uL (ref 0.9–3.3)
MCH: 35.3 pg — ABNORMAL HIGH (ref 25.1–34.0)
MCHC: 33.7 g/dL (ref 31.5–36.0)
MCV: 104.8 fL — ABNORMAL HIGH (ref 79.5–101.0)
MONO ABS: 0.8 10*3/uL (ref 0.1–0.9)
MONOS PCT: 12 %
Neutro Abs: 4.4 10*3/uL (ref 1.5–6.5)
Neutrophils Relative %: 68 %
Platelets: 186 10*3/uL (ref 145–400)
RBC: 2.72 MIL/uL — ABNORMAL LOW (ref 3.70–5.45)
RDW: 17.4 % — ABNORMAL HIGH (ref 11.2–14.5)
WBC: 6.5 10*3/uL (ref 3.9–10.3)

## 2018-03-29 LAB — CMP (CANCER CENTER ONLY)
ALT: 15 U/L (ref 0–44)
AST: 65 U/L — ABNORMAL HIGH (ref 15–41)
Albumin: 1.5 g/dL — ABNORMAL LOW (ref 3.5–5.0)
Alkaline Phosphatase: 274 U/L — ABNORMAL HIGH (ref 38–126)
Anion gap: 10 (ref 5–15)
BUN: 8 mg/dL (ref 8–23)
CHLORIDE: 93 mmol/L — AB (ref 98–111)
CO2: 27 mmol/L (ref 22–32)
CREATININE: 0.68 mg/dL (ref 0.44–1.00)
Calcium: 8.2 mg/dL — ABNORMAL LOW (ref 8.9–10.3)
GFR, Est AFR Am: >60 mL/min (ref >60)
GFR, Estimated: >60 mL/min (ref >60)
Glucose, Bld: 115 mg/dL — ABNORMAL HIGH (ref 70–99)
Potassium: 4.2 mmol/L (ref 3.5–5.1)
Sodium: 130 mmol/L — ABNORMAL LOW (ref 135–145)
Total Bilirubin: 1.5 mg/dL — ABNORMAL HIGH (ref 0.3–1.2)
Total Protein: 8.4 g/dL — ABNORMAL HIGH (ref 6.5–8.1)

## 2018-03-29 LAB — SAMPLE TO BLOOD BANK

## 2018-03-30 ENCOUNTER — Encounter (HOSPITAL_COMMUNITY): Payer: Self-pay | Admitting: Hematology

## 2018-03-30 DIAGNOSIS — K7460 Unspecified cirrhosis of liver: Secondary | ICD-10-CM | POA: Diagnosis not present

## 2018-03-30 DIAGNOSIS — C9000 Multiple myeloma not having achieved remission: Secondary | ICD-10-CM | POA: Diagnosis not present

## 2018-03-30 DIAGNOSIS — B199 Unspecified viral hepatitis without hepatic coma: Secondary | ICD-10-CM | POA: Diagnosis not present

## 2018-03-30 DIAGNOSIS — I700 Atherosclerosis of aorta: Secondary | ICD-10-CM | POA: Diagnosis not present

## 2018-03-30 DIAGNOSIS — E876 Hypokalemia: Secondary | ICD-10-CM | POA: Diagnosis not present

## 2018-03-30 DIAGNOSIS — E871 Hypo-osmolality and hyponatremia: Secondary | ICD-10-CM | POA: Diagnosis not present

## 2018-03-30 LAB — MULTIPLE MYELOMA PANEL, SERUM
ALBUMIN SERPL ELPH-MCNC: 2.2 g/dL — AB (ref 2.9–4.4)
ALBUMIN/GLOB SERPL: 0.4 — AB (ref 0.7–1.7)
ALPHA 1: 0.3 g/dL (ref 0.0–0.4)
Alpha2 Glob SerPl Elph-Mcnc: 0.4 g/dL (ref 0.4–1.0)
B-Globulin SerPl Elph-Mcnc: 4.3 g/dL — ABNORMAL HIGH (ref 0.7–1.3)
GAMMA GLOB SERPL ELPH-MCNC: 0.9 g/dL (ref 0.4–1.8)
GLOBULIN, TOTAL: 5.9 g/dL — AB (ref 2.2–3.9)
IGA: 4930 mg/dL — AB (ref 87–352)
IgG (Immunoglobin G), Serum: 744 mg/dL (ref 700–1600)
IgM (Immunoglobulin M), Srm: 63 mg/dL (ref 26–217)
M Protein SerPl Elph-Mcnc: 3.7 g/dL — ABNORMAL HIGH
Total Protein ELP: 8.1 g/dL (ref 6.0–8.5)

## 2018-03-31 ENCOUNTER — Inpatient Hospital Stay: Payer: Medicare Other

## 2018-04-01 DIAGNOSIS — K7460 Unspecified cirrhosis of liver: Secondary | ICD-10-CM | POA: Diagnosis not present

## 2018-04-01 DIAGNOSIS — I700 Atherosclerosis of aorta: Secondary | ICD-10-CM | POA: Diagnosis not present

## 2018-04-01 DIAGNOSIS — E876 Hypokalemia: Secondary | ICD-10-CM | POA: Diagnosis not present

## 2018-04-01 DIAGNOSIS — C9000 Multiple myeloma not having achieved remission: Secondary | ICD-10-CM | POA: Diagnosis not present

## 2018-04-01 DIAGNOSIS — E871 Hypo-osmolality and hyponatremia: Secondary | ICD-10-CM | POA: Diagnosis not present

## 2018-04-01 DIAGNOSIS — B199 Unspecified viral hepatitis without hepatic coma: Secondary | ICD-10-CM | POA: Diagnosis not present

## 2018-04-04 ENCOUNTER — Telehealth: Payer: Self-pay

## 2018-04-04 ENCOUNTER — Encounter: Payer: Self-pay | Admitting: Internal Medicine

## 2018-04-04 ENCOUNTER — Ambulatory Visit (INDEPENDENT_AMBULATORY_CARE_PROVIDER_SITE_OTHER): Payer: Medicare Other | Admitting: Internal Medicine

## 2018-04-04 ENCOUNTER — Encounter: Payer: Self-pay | Admitting: Hematology

## 2018-04-04 VITALS — BP 112/64 | HR 125 | Ht 59.0 in | Wt 71.4 lb

## 2018-04-04 DIAGNOSIS — J449 Chronic obstructive pulmonary disease, unspecified: Secondary | ICD-10-CM | POA: Diagnosis not present

## 2018-04-04 DIAGNOSIS — Z7189 Other specified counseling: Secondary | ICD-10-CM | POA: Insufficient documentation

## 2018-04-04 DIAGNOSIS — D649 Anemia, unspecified: Secondary | ICD-10-CM | POA: Diagnosis not present

## 2018-04-04 DIAGNOSIS — C9000 Multiple myeloma not having achieved remission: Secondary | ICD-10-CM | POA: Insufficient documentation

## 2018-04-04 MED ORDER — ONDANSETRON HCL 8 MG PO TABS: 8.0000 mg | ORAL_TABLET | Freq: Two times a day (BID) | ORAL | 1 refills | Status: DC | PRN

## 2018-04-04 MED ORDER — ACYCLOVIR 400 MG PO TABS: 400.0000 mg | ORAL_TABLET | Freq: Two times a day (BID) | ORAL | 3 refills | Status: DC

## 2018-04-04 NOTE — Progress Notes (Signed)
Subjective:    Patient ID: Cassidy Barrett, female    DOB: 12-Nov-1945   MRN: [id number]    Brief patient profile:  72 yo former smoker (04/2014) referred by [facility] after hospitalization for pleural effusion, CAP and COPD exacerbation . Patient has alcoholic cirrhosis, ascites, hyponatremia   06/25/2014  [facility] follow up / NP eval  Patient was admitted October 19 through October 30 for COPD exacerbation, acute hypoxic respiratory failure with community-acquired pneumonia and a right pleural effusion She was treated with IV antibiotics. CT chest showed a moderate layering right pleural effusion with compression of the right lung. CT abdomen showed possible liver cirrhosis with ascites and a 3-4 cm soft tissue mass in the rectum. Labs were remarkable for elevated LFTs and low sodium She underwent a paracentesis with 140 cc removed. She was seen by nephrology, and was treated with hypertonic IV fluids and salt tablets with improvement of her sodium Sodium returned to normal prior to discharge. Patient was seen by gastroenterology and underwent colonoscopy with polyp removal , path showed a tubovillous adenoma.  No formal dx of COPD in past. Has quit smoking since discharge. Denies any alcohol intake since discharge Since discharge, she is feeling improved. She has decreased shortness of breath or cough. Chest x-ray today is improved with resolution of pleural effusion. She denies any chest pain, orthopnea, PND or leg swelling She has home health and physical therapy at home Feels that her strength is improving. rec Great job on not smoking -keep up good work.  Follow up Dr. Gwenette Greet in 4-6 weeks with PFT > did not return    03/02/2017 1st [facility] office visit/ re-establish care/ Cassidy Barrett  / stopped smoking 02/24/17  Chief Complaint  Patient presents with  . Pulmonary Consult    Referred by PCP. Pt c/o SOB for the past wk. She states she gets SOB with walking short distances such as  from room to room at home. She also gets SOB lying down and wakes up feeling breathless.   best  Day =  3 weeks prior to OV  Limited by knee and back more than doe and no need for inhalers Onset of tachycardia was 4 weeks prior to OV  > rx metaprolol around 3 weeks prior to OV worse sob so > coreg 8/1-8/5   But no better and then improved off coreg x 48 h prior to OV   rx ventolin seems to help only used 20 doses since this flare  Doe = MMRC4  = sob if tries to leave home or while getting dressed   Also nightly sob unless sleeps upright x 3 weeks  rec Plan A = Automatic = Spiriva 2.'5mg'$   X 2 pffs each am Work on inhaler technique: Plan B = Backup Only use your albuterol (ventolin) as a rescue medication  Strongly prefer in this setting if need a beta blocker:  bisoprolol the most selective generic choice  on the market.      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 Follow-up    patient is doing better  [facility] = can't walk a nl pace on a flat grade s sob but does fine slow and flat eg shopping leaning on rolator  Using albuterol up to twice weekly  rec Plan A = Automatic = Incruse  or Anoro one click  each am - take two good  drags smooth and deep and then rinse your mouth Plan B = Backup Only use your albuterol as a rescue medication    07/30/17  NP  Zpack take as directed .  Mucinex DM Twice daily  As needed  Cough/congestion  Prednisone taper over next week .  Fluids and rest  follow up Dr. Melvyn Novas  As planned and As needed     09/17/2017  [name]  Chief Complaint  Patient presents with  . Follow-up    Sinus pressure and congestion for the past 3 days. She is coughing some but no sputum production. Breathing is overall doing well. She is using her albuterol inhaler 4 x per wk on average.    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 Follow-up    6 month follow up. Patient states her breathing has been ok since her last visit. Recently diagnosed with multiple myeloma. She will start chemo this Friday.    Dyspnea:  Limited by legs Cough: no Sleeping: on propped up 30 degrees  SABA use: when over does it  02: none     No obvious day to day or daytime variability or assoc excess/ purulent sputum or mucus plugs or hemoptysis or cp or chest tightness, subjective wheeze or overt sinus or hb symptoms.   Sleeping as above without nocturnal  or early am exacerbation  of respiratory  c/o's or need for noct saba. Also denies any obvious fluctuation of symptoms with weather or environmental changes or other aggravating or alleviating factors except as outlined above   No unusual exposure hx or h/o childhood pna/ asthma or knowledge of premature birth.  Current Allergies, Complete Past Medical History, Past Surgical History, Family History, and Social History were reviewed in [employer] record.  ROS  The following are not active complaints unless bolded Hoarseness, sore throat, dysphagia, dental problems, itching, sneezing,  nasal congestion or discharge of excess mucus or purulent secretions, ear ache,   fever, chills, sweats, unintended wt loss or wt gain, classically pleuritic or exertional cp,  orthopnea pnd or arm/hand swelling  or leg swelling, presyncope, palpitations, abdominal pain, anorexia, nausea, vomiting, diarrhea  or change in bowel habits or change in bladder habits, change in stools or change in urine, dysuria, hematuria,   rash, arthralgias, visual complaints, headache, numbness, weakness or ataxia or problems with walking or coordination,  change in mood or  memory.        Current Meds  Medication Sig  . acyclovir (ZOVIRAX) 400 MG tablet Take 1 tablet (400 mg total) by mouth 2 (two) times daily.  Marland Kitchen albuterol (PROVENTIL HFA;VENTOLIN HFA) 108 (90 Base) MCG/ACT inhaler INHALE 2 PUFFS INTO THE LUNGS EVERY 6 HOURS AS NEEDED FOR WHEEZING OR SHORTNESS OF BREATH  . ANORO ELLIPTA 62.5-25 MCG/INH AEPB INHALE 1 PUFF INTO THE LUNGS DAILY  . calcitonin, salmon, (MIACALCIN/FORTICAL) 200 UNIT/ACT nasal spray  U 1 SPRAY NASALLY D. ALTERNATING NOSTRILS  . ergocalciferol (VITAMIN D2) 50000 units capsule Take 1 capsule (50,000 Units total) by mouth once a week.  . folic acid (FOLVITE) 1 MG tablet Take 1 tablet (1 mg total) by mouth daily.  . furosemide (LASIX) 20 MG tablet Take 0.5 tablets (10 mg total) by mouth daily.  Marland Kitchen lidocaine (LIDODERM) 5 % Place 1-2 patches onto the skin daily as needed (severe pain). Remove & Discard patch within 12 hours or as directed by MD  . magnesium oxide (MAG-OX) 400 (241.3 Mg) MG tablet Take 1 tablet (400 mg total) by mouth daily.  . Menthol, Topical Analgesic, (BIOFREEZE EX) Apply topically as directed.  . ondansetron (ZOFRAN) 8 MG tablet Take 1 tablet (8 mg total) by mouth 2 (two) times daily as needed (Nausea or vomiting).  Marland Kitchen OVER THE COUNTER MEDICATION Apply 400 mg topically daily as needed (arthritis/back pain). CBD cream  . pantoprazole (PROTONIX) 40 MG tablet Take 1 tablet (40 mg total) by mouth daily.  . potassium chloride (KLOR-CON) 20 MEQ packet Take 40 mEq by mouth 2 (two) times daily.  Marland Kitchen spironolactone (ALDACTONE) 50 MG tablet Take 1 tablet (50 mg total) by mouth daily.                   Objective:   Physical Exam    elderly wf in w/c for first time  04/04/2018          73  09/17/2017        79  03/02/17 80 lb (36.3 kg)  01/21/17 81 lb 9.6 oz (37 kg)  01/01/17 80 lb 12.8 oz  (36.7 kg)    Vital signs reviewed - Note on arrival 02 sats  93% on RA   HEENT: nl   oropharynx. Nl external ear canals without cough reflex -  Mild bilateral non-specific turbinate edema /edentulous     NECK :  without JVD/Nodes/TM/ nl carotid upstrokes bilaterally   LUNGS: no acc muscle use,  Mild barrel  contour chest wall with bilateral  Distant bs s audible wheeze and  without cough on insp or exp maneuver and mild  Hyperresonant  to  percussion bilaterally     CV:  RRR  no s3 or murmur or increase in P2, and no edema   ABD:  soft and nontender with pos mid insp Hoover's . No bruits or organomegaly appreciated, bowel sounds nl  MS:    ext warm without deformities, calf tenderness, cyanosis or clubbing No obvious joint restrictions   SKIN: warm and dry without lesions    NEURO:  alert, approp, nl sensorium with  no motor or cerebellar deficits apparent.           Assessment & Plan:

## 2018-04-04 NOTE — Progress Notes (Signed)
Patient on plan of care prior to pathways. 

## 2018-04-04 NOTE — Progress Notes (Signed)
START ON PATHWAY REGIMEN - Multiple Myeloma and Other Plasma Cell Dyscrasias     A cycle is every 21 days:     Bortezomib      Lenalidomide      Dexamethasone   **Always confirm dose/schedule in your pharmacy ordering system**  Patient Characteristics: Newly Diagnosed, Transplant Ineligible or Refused, Standard Risk R-ISS Staging: Unknown Disease Classification: Newly Diagnosed Is Patient Eligible for Transplant<= Transplant Ineligible or Refused Risk Status: Standard Risk Intent of Therapy: Non-Curative / Palliative Intent, Discussed with Patient

## 2018-04-04 NOTE — Patient Instructions (Signed)
Work on inhaler technique:  relax and gently blow all the way out then take a nice smooth deep breath back in, triggering the inhaler at same time you start breathing in.  Hold for up to 5 seconds if you can. Blow out thru nose. Rinse and gargle with water when done      Please schedule a follow up visit in 12 months but call sooner if needed  

## 2018-04-04 NOTE — Telephone Encounter (Signed)
Called patient to let her know that meds are ready for pick up at [facility]. Patient verbalized understanding.

## 2018-04-05 ENCOUNTER — Encounter: Payer: Self-pay | Admitting: Internal Medicine

## 2018-04-05 DIAGNOSIS — D649 Anemia, unspecified: Secondary | ICD-10-CM | POA: Insufficient documentation

## 2018-04-05 NOTE — Assessment & Plan Note (Signed)
Quit smoking 02/24/17 -  Spirometry 03/02/2017  FEV1 0.27 (14%)  Ratio 40 with severe curvature 15 min p saba - 03/02/2017  After extensive coaching device  effectiveness =   75% (short Ti) with smi > try spiriva 2.5   > insurance did not cover so changed to incruse - PFT's  05/26/2017  FEV1 0.73 (39 % ) ratio 40   p no % improvement from saba p incruse 18h  prior to study with DLCO  79/70c % corrects to 109  % for alv volume   - 05/26/2017    try anoro one click each am     02/12/9469  After extensive coaching inhaler device  effectiveness =    50% with hfa/ 90% with dpi    - The proper method of use, as well as anticipated side effects, of a metered-dose inhaler are discussed and demonstrated to the patient. Improved effectiveness after extensive coaching during this visit to a level of approximately 75 % from a baseline of 50 % with hfa and 90% with dpi so no change in inhaler type needed   I had an extended discussion with the patient reviewing all relevant studies completed to date and  lasting 15 to 20 minutes of a 25 minute visit    See device teaching which extended face to face time for this visit.  Each maintenance medication was reviewed in detail including emphasizing most importantly the difference between maintenance and prns and under what circumstances the prns are to be triggered using an action plan format that is not reflected in the computer generated alphabetically organized AVS which I have not found useful in most complex patients, especially with respiratory illnesses  Please see AVS for specific instructions unique to this visit that I personally wrote and verbalized to the the pt in detail and then reviewed with pt  by my nurse highlighting any  changes in therapy recommended at today's visit to their plan of care.

## 2018-04-05 NOTE — Assessment & Plan Note (Addendum)
  Lab Results  Component Value Date   HGB 9.6 (L) 03/29/2018   HGB 10.9 (L) 03/21/2018   HGB 9.7 (L) 02/28/2018   HGB 10.0 (L) 02/01/2018   HGB 14.1 09/27/2017   HGB 14.5 05/31/2017   HGB 12.8 01/21/2017   HGB 14.0 09/24/2016    On rx for MM now, anemia very may contribute to poor activity tolerance / doe  > f/u hematology

## 2018-04-06 ENCOUNTER — Telehealth: Payer: Self-pay

## 2018-04-06 DIAGNOSIS — C9000 Multiple myeloma not having achieved remission: Secondary | ICD-10-CM | POA: Diagnosis not present

## 2018-04-06 DIAGNOSIS — B199 Unspecified viral hepatitis without hepatic coma: Secondary | ICD-10-CM | POA: Diagnosis not present

## 2018-04-06 DIAGNOSIS — K7460 Unspecified cirrhosis of liver: Secondary | ICD-10-CM | POA: Diagnosis not present

## 2018-04-06 DIAGNOSIS — E871 Hypo-osmolality and hyponatremia: Secondary | ICD-10-CM | POA: Diagnosis not present

## 2018-04-06 DIAGNOSIS — E876 Hypokalemia: Secondary | ICD-10-CM | POA: Diagnosis not present

## 2018-04-06 DIAGNOSIS — I700 Atherosclerosis of aorta: Secondary | ICD-10-CM | POA: Diagnosis not present

## 2018-04-06 MED ORDER — SPIRONOLACTONE 50 MG PO TABS: 50.0000 mg | ORAL_TABLET | Freq: Every day | ORAL | 2 refills | Status: DC

## 2018-04-06 MED ORDER — MAGNESIUM OXIDE 400 (241.3 MG) MG PO TABS: 400.0000 mg | ORAL_TABLET | Freq: Every day | ORAL | 2 refills | Status: DC

## 2018-04-06 MED ORDER — POTASSIUM CHLORIDE 20 MEQ PO PACK: 40.0000 meq | PACK | Freq: Two times a day (BID) | ORAL | 0 refills | Status: DC

## 2018-04-06 MED ORDER — POTASSIUM CHLORIDE CRYS ER 20 MEQ PO TBCR: 20.0000 meq | EXTENDED_RELEASE_TABLET | Freq: Two times a day (BID) | ORAL | 0 refills | Status: DC

## 2018-04-06 NOTE — Telephone Encounter (Signed)
[facility] requesting refill on patients Mag-oxide and her Spironolactone, please advise Sir, thank you.

## 2018-04-06 NOTE — Telephone Encounter (Signed)
OK to refill for 3 mos

## 2018-04-06 NOTE — Telephone Encounter (Signed)
Ok x 3 mos  I should see her Nov/Dec

## 2018-04-06 NOTE — Telephone Encounter (Signed)
Pharmacy also wants her K refilled Sir, okay?

## 2018-04-06 NOTE — Telephone Encounter (Signed)
I've refilled the medicines and put myself a note to call her when the Nov/Dec schedule comes out. Pharmacy called back and wanted to know if the K was packets or tablets. Dr Carlean Purl said to use what she had before which was the tablets.

## 2018-04-07 ENCOUNTER — Other Ambulatory Visit: Payer: Self-pay

## 2018-04-07 ENCOUNTER — Telehealth: Payer: Self-pay | Admitting: Nurse Practitioner

## 2018-04-07 DIAGNOSIS — C9000 Multiple myeloma not having achieved remission: Secondary | ICD-10-CM

## 2018-04-07 NOTE — Telephone Encounter (Signed)
When do you want her in for follow up?

## 2018-04-08 ENCOUNTER — Inpatient Hospital Stay: Payer: Medicare Other

## 2018-04-08 ENCOUNTER — Telehealth: Payer: Self-pay

## 2018-04-08 VITALS — BP 107/57 | HR 124 | Temp 98.2°F | Resp 18

## 2018-04-08 DIAGNOSIS — M810 Age-related osteoporosis without current pathological fracture: Secondary | ICD-10-CM | POA: Diagnosis not present

## 2018-04-08 DIAGNOSIS — E871 Hypo-osmolality and hyponatremia: Secondary | ICD-10-CM | POA: Diagnosis not present

## 2018-04-08 DIAGNOSIS — Z5112 Encounter for antineoplastic immunotherapy: Secondary | ICD-10-CM | POA: Diagnosis not present

## 2018-04-08 DIAGNOSIS — C9000 Multiple myeloma not having achieved remission: Secondary | ICD-10-CM | POA: Diagnosis not present

## 2018-04-08 DIAGNOSIS — K7460 Unspecified cirrhosis of liver: Secondary | ICD-10-CM | POA: Diagnosis not present

## 2018-04-08 DIAGNOSIS — D649 Anemia, unspecified: Secondary | ICD-10-CM | POA: Diagnosis not present

## 2018-04-08 DIAGNOSIS — Z7189 Other specified counseling: Secondary | ICD-10-CM

## 2018-04-08 LAB — CMP (CANCER CENTER ONLY)
ALT: 17 U/L (ref 0–44)
ANION GAP: 10 (ref 5–15)
AST: 69 U/L — ABNORMAL HIGH (ref 15–41)
Albumin: 1.5 g/dL — ABNORMAL LOW (ref 3.5–5.0)
Alkaline Phosphatase: 299 U/L — ABNORMAL HIGH (ref 38–126)
BILIRUBIN TOTAL: 1.6 mg/dL — AB (ref 0.3–1.2)
BUN: 8 mg/dL (ref 8–23)
CALCIUM: 8.9 mg/dL (ref 8.9–10.3)
CO2: 24 mmol/L (ref 22–32)
CREATININE: 0.64 mg/dL (ref 0.44–1.00)
Chloride: 94 mmol/L — ABNORMAL LOW (ref 98–111)
Glucose, Bld: 124 mg/dL — ABNORMAL HIGH (ref 70–99)
Potassium: 4.3 mmol/L (ref 3.5–5.1)
Sodium: 128 mmol/L — ABNORMAL LOW (ref 135–145)
Total Protein: 8.7 g/dL — ABNORMAL HIGH (ref 6.5–8.1)

## 2018-04-08 LAB — CBC WITH DIFFERENTIAL (CANCER CENTER ONLY)
BASOS ABS: 0.1 10*3/uL (ref 0.0–0.1)
Basophils Relative: 1 %
Eosinophils Absolute: 0.1 10*3/uL (ref 0.0–0.5)
Eosinophils Relative: 2 %
HEMATOCRIT: 29 % — AB (ref 34.8–46.6)
Hemoglobin: 10 g/dL — ABNORMAL LOW (ref 11.6–15.9)
LYMPHS PCT: 16 %
Lymphs Abs: 0.9 10*3/uL (ref 0.9–3.3)
MCH: 36.2 pg — ABNORMAL HIGH (ref 25.1–34.0)
MCHC: 34.5 g/dL (ref 31.5–36.0)
MCV: 105.1 fL — AB (ref 79.5–101.0)
MONOS PCT: 14 %
Monocytes Absolute: 0.8 10*3/uL (ref 0.1–0.9)
NEUTROS ABS: 3.9 10*3/uL (ref 1.5–6.5)
Neutrophils Relative %: 67 %
Platelet Count: 211 10*3/uL (ref 145–400)
RBC: 2.76 MIL/uL — ABNORMAL LOW (ref 3.70–5.45)
RDW: 18.3 % — ABNORMAL HIGH (ref 11.2–14.5)
WBC Count: 5.8 10*3/uL (ref 3.9–10.3)

## 2018-04-08 MED ORDER — DENOSUMAB 120 MG/1.7ML ~~LOC~~ SOLN
SUBCUTANEOUS | Status: AC
  Filled 2018-04-08: qty 1.7

## 2018-04-08 MED ORDER — PROCHLORPERAZINE MALEATE 10 MG PO TABS
10.0000 mg | ORAL_TABLET | Freq: Once | ORAL | Status: AC
  Administered 2018-04-08: 10 mg via ORAL

## 2018-04-08 MED ORDER — PROCHLORPERAZINE MALEATE 10 MG PO TABS
ORAL_TABLET | ORAL | Status: AC
  Filled 2018-04-08: qty 1

## 2018-04-08 MED ORDER — DENOSUMAB 120 MG/1.7ML ~~LOC~~ SOLN
120.0000 mg | Freq: Once | SUBCUTANEOUS | Status: AC
  Administered 2018-04-08: 120 mg via SUBCUTANEOUS

## 2018-04-08 MED ORDER — DEXAMETHASONE 4 MG PO TABS
ORAL_TABLET | ORAL | Status: AC
  Filled 2018-04-08: qty 5

## 2018-04-08 MED ORDER — DEXAMETHASONE 4 MG PO TABS
20.0000 mg | ORAL_TABLET | Freq: Once | ORAL | Status: AC
  Administered 2018-04-08: 20 mg via ORAL

## 2018-04-08 MED ORDER — BORTEZOMIB CHEMO SQ INJECTION 3.5 MG (2.5MG/ML)
1.0000 mg/m2 | Freq: Once | INTRAMUSCULAR | Status: AC
  Administered 2018-04-08: 1.25 mg via SUBCUTANEOUS
  Filled 2018-04-08: qty 0.5

## 2018-04-08 NOTE — Telephone Encounter (Signed)
Cassidy Barrett, this patient needs to follow up with her primary GI, I think Dr. Carlean Purl. I am happy to see her in the interim for acute problems but she needs a routine follow-up on the books with him.  Thanks

## 2018-04-08 NOTE — Telephone Encounter (Signed)
Per Dr. Irene Limbo it is ok to treat with Velcade today with a heart rate of 124.

## 2018-04-08 NOTE — Patient Instructions (Signed)
[facility] Discharge Instructions for Patients Receiving Chemotherapy  Today you received the following chemotherapy agents Velcade  To help prevent nausea and vomiting after your treatment, we encourage you to take your nausea medication.   If you develop nausea and vomiting that is not controlled by your nausea medication, call the clinic.   BELOW ARE SYMPTOMS THAT SHOULD BE REPORTED IMMEDIATELY:  *FEVER GREATER THAN 100.5 F  *CHILLS WITH OR WITHOUT FEVER  NAUSEA AND VOMITING THAT IS NOT CONTROLLED WITH YOUR NAUSEA MEDICATION  *UNUSUAL SHORTNESS OF BREATH  *UNUSUAL BRUISING OR BLEEDING  TENDERNESS IN MOUTH AND THROAT WITH OR WITHOUT PRESENCE OF ULCERS  *URINARY PROBLEMS  *BOWEL PROBLEMS  UNUSUAL RASH Items with * indicate a potential emergency and should be followed up as soon as possible.  Feel free to call the clinic should you have any questions or concerns. The clinic phone number is [phone].  Please show the [facility] at check-in to the Emergency Department and triage nurse.  Bortezomib injection What is this medicine? BORTEZOMIB (bor TEZ oh mib) is a medicine that targets proteins in cancer cells and stops the cancer cells from growing. It is used to treat multiple myeloma and mantle-cell lymphoma. This medicine may be used for other purposes; ask your health care provider or pharmacist if you have questions. COMMON BRAND NAME(S): Velcade What should I tell my health care provider before I take this medicine? They need to know if you have any of these conditions: -diabetes -heart disease -irregular heartbeat -liver disease -on hemodialysis -low blood counts, like low white blood cells, platelets, or hemoglobin -peripheral neuropathy -taking medicine for blood pressure -an unusual or allergic reaction to bortezomib, mannitol, boron, other medicines, foods, dyes, or preservatives -pregnant or trying to get  pregnant -breast-feeding How should I use this medicine? This medicine is for injection into a vein or for injection under the skin. It is given by a health care professional in a hospital or clinic setting. Talk to your pediatrician regarding the use of this medicine in children. Special care may be needed. Overdosage: If you think you have taken too much of this medicine contact a poison control center or emergency room at once. NOTE: This medicine is only for you. Do not share this medicine with others. What if I miss a dose? It is important not to miss your dose. Call your doctor or health care professional if you are unable to keep an appointment. What may interact with this medicine? This medicine may interact with the following medications: -ketoconazole -rifampin -ritonavir -St. John's Wort This list may not describe all possible interactions. Give your health care provider a list of all the medicines, herbs, non-prescription drugs, or dietary supplements you use. Also tell them if you smoke, drink alcohol, or use illegal drugs. Some items may interact with your medicine. What should I watch for while using this medicine? You may get drowsy or dizzy. Do not drive, use machinery, or do anything that needs mental alertness until you know how this medicine affects you. Do not stand or sit up quickly, especially if you are an older patient. This reduces the risk of dizzy or fainting spells. In some cases, you may be given additional medicines to help with side effects. Follow all directions for their use. Call your doctor or health care professional for advice if you get a fever, chills or sore throat, or other symptoms of a cold or flu. Do not treat yourself. This  drug decreases your body's ability to fight infections. Try to avoid being around people who are sick. This medicine may increase your risk to bruise or bleed. Call your doctor or health care professional if you notice any unusual  bleeding. You may need blood work done while you are taking this medicine. In some patients, this medicine may cause a serious brain infection that may cause death. If you have any problems seeing, thinking, speaking, walking, or standing, tell your doctor right away. If you cannot reach your doctor, urgently seek other source of medical care. Check with your doctor or health care professional if you get an attack of severe diarrhea, nausea and vomiting, or if you sweat a lot. The loss of too much body fluid can make it dangerous for you to take this medicine. Do not become pregnant while taking this medicine or for at least 2 months after stopping it. Women should inform their doctor if they wish to become pregnant or think they might be pregnant. Men should not father a child while taking this medicine and for at least 2 months after stopping it. There is a potential for serious side effects to an unborn child. Talk to your health care professional or pharmacist for more information. Do not breast-feed an infant while taking this medicine or for 2 months after stopping it. This medicine may interfere with the ability to have a child. You should talk with your doctor or health care professional if you are concerned about your fertility. What side effects may I notice from receiving this medicine? Side effects that you should report to your doctor or health care professional as soon as possible: -allergic reactions like skin rash, itching or hives, swelling of the face, lips, or tongue -breathing problems -changes in hearing -changes in vision -fast, irregular heartbeat -feeling faint or lightheaded, falls -pain, tingling, numbness in the hands or feet -right upper belly pain -seizures -swelling of the ankles, feet, hands -unusual bleeding or bruising -unusually weak or tired -vomiting -yellowing of the eyes or skin Side effects that usually do not require medical attention (report to your  doctor or health care professional if they continue or are bothersome): -changes in emotions or moods -constipation -diarrhea -loss of appetite -headache -irritation at site where injected -nausea This list may not describe all possible side effects. Call your doctor for medical advice about side effects. You may report side effects to FDA at 1-800-FDA-1088. Where should I keep my medicine? This drug is given in a hospital or clinic and will not be stored at home. NOTE: This sheet is a summary. It may not cover all possible information. If you have questions about this medicine, talk to your doctor, pharmacist, or health care provider.  2018 Elsevier/Gold Standard (2016-06-11 15:53:51)  Denosumab injection What is this medicine? DENOSUMAB (den oh sue mab) slows bone breakdown. Prolia is used to treat osteoporosis in women after menopause and in men. Delton See is used to treat a high calcium level due to cancer and to prevent bone fractures and other bone problems caused by multiple myeloma or cancer bone metastases. Delton See is also used to treat giant cell tumor of the bone. This medicine may be used for other purposes; ask your health care provider or pharmacist if you have questions. COMMON BRAND NAME(S): Prolia, XGEVA What should I tell my health care provider before I take this medicine? They need to know if you have any of these conditions: -dental disease -having surgery or tooth extraction -  infection -kidney disease -low levels of calcium or Vitamin D in the blood -malnutrition -on hemodialysis -skin conditions or sensitivity -thyroid or parathyroid disease -an unusual reaction to denosumab, other medicines, foods, dyes, or preservatives -pregnant or trying to get pregnant -breast-feeding How should I use this medicine? This medicine is for injection under the skin. It is given by a health care professional in a hospital or clinic setting. If you are getting Prolia, a special  MedGuide will be given to you by the pharmacist with each prescription and refill. Be sure to read this information carefully each time. For Prolia, talk to your pediatrician regarding the use of this medicine in children. Special care may be needed. For Delton See, talk to your pediatrician regarding the use of this medicine in children. While this drug may be prescribed for children as young as 13 years for selected conditions, precautions do apply. Overdosage: If you think you have taken too much of this medicine contact a poison control center or emergency room at once. NOTE: This medicine is only for you. Do not share this medicine with others. What if I miss a dose? It is important not to miss your dose. Call your doctor or health care professional if you are unable to keep an appointment. What may interact with this medicine? Do not take this medicine with any of the following medications: -other medicines containing denosumab This medicine may also interact with the following medications: -medicines that lower your chance of fighting infection -steroid medicines like prednisone or cortisone This list may not describe all possible interactions. Give your health care provider a list of all the medicines, herbs, non-prescription drugs, or dietary supplements you use. Also tell them if you smoke, drink alcohol, or use illegal drugs. Some items may interact with your medicine. What should I watch for while using this medicine? Visit your doctor or health care professional for regular checks on your progress. Your doctor or health care professional may order blood tests and other tests to see how you are doing. Call your doctor or health care professional for advice if you get a fever, chills or sore throat, or other symptoms of a cold or flu. Do not treat yourself. This drug may decrease your body's ability to fight infection. Try to avoid being around people who are sick. You should make sure you get  enough calcium and vitamin D while you are taking this medicine, unless your doctor tells you not to. Discuss the foods you eat and the vitamins you take with your health care professional. See your dentist regularly. Brush and floss your teeth as directed. Before you have any dental work done, tell your dentist you are receiving this medicine. Do not become pregnant while taking this medicine or for 5 months after stopping it. Talk with your doctor or health care professional about your birth control options while taking this medicine. Women should inform their doctor if they wish to become pregnant or think they might be pregnant. There is a potential for serious side effects to an unborn child. Talk to your health care professional or pharmacist for more information. What side effects may I notice from receiving this medicine? Side effects that you should report to your doctor or health care professional as soon as possible: -allergic reactions like skin rash, itching or hives, swelling of the face, lips, or tongue -bone pain -breathing problems -dizziness -jaw pain, especially after dental work -redness, blistering, peeling of the skin -signs and symptoms of infection  like fever or chills; cough; sore throat; pain or trouble passing urine -signs of low calcium like fast heartbeat, muscle cramps or muscle pain; pain, tingling, numbness in the hands or feet; seizures -unusual bleeding or bruising -unusually weak or tired Side effects that usually do not require medical attention (report to your doctor or health care professional if they continue or are bothersome): -constipation -diarrhea -headache -joint pain -loss of appetite -muscle pain -runny nose -tiredness -upset stomach This list may not describe all possible side effects. Call your doctor for medical advice about side effects. You may report side effects to FDA at 1-800-FDA-1088. Where should I keep my medicine? This medicine is  only given in a clinic, doctor's office, or other health care setting and will not be stored at home. NOTE: This sheet is a summary. It may not cover all possible information. If you have questions about this medicine, talk to your doctor, pharmacist, or health care provider.  2018 Elsevier/Gold Standard (2016-08-04 19:17:21)

## 2018-04-08 NOTE — Telephone Encounter (Signed)
Message left on the patient's voicemail. I have scheduled her with Dr Carlean Purl 05/25/18 at 1:30 pm.

## 2018-04-11 ENCOUNTER — Telehealth: Payer: Self-pay | Admitting: Internal Medicine

## 2018-04-11 NOTE — Telephone Encounter (Signed)
She can stop it.

## 2018-04-12 ENCOUNTER — Telehealth: Payer: Self-pay | Admitting: Internal Medicine

## 2018-04-12 NOTE — Telephone Encounter (Signed)
Left a message for the patient with this information.

## 2018-04-12 NOTE — Telephone Encounter (Signed)
Confirmed the message from Dr Carlean Purl. She will stop the Protonix.

## 2018-04-13 ENCOUNTER — Encounter: Payer: Self-pay | Admitting: Hematology

## 2018-04-13 DIAGNOSIS — K7460 Unspecified cirrhosis of liver: Secondary | ICD-10-CM | POA: Diagnosis not present

## 2018-04-13 DIAGNOSIS — E871 Hypo-osmolality and hyponatremia: Secondary | ICD-10-CM | POA: Diagnosis not present

## 2018-04-13 DIAGNOSIS — I700 Atherosclerosis of aorta: Secondary | ICD-10-CM | POA: Diagnosis not present

## 2018-04-13 DIAGNOSIS — E876 Hypokalemia: Secondary | ICD-10-CM | POA: Diagnosis not present

## 2018-04-13 DIAGNOSIS — B199 Unspecified viral hepatitis without hepatic coma: Secondary | ICD-10-CM | POA: Diagnosis not present

## 2018-04-13 DIAGNOSIS — C9000 Multiple myeloma not having achieved remission: Secondary | ICD-10-CM | POA: Diagnosis not present

## 2018-04-14 ENCOUNTER — Other Ambulatory Visit: Payer: Self-pay

## 2018-04-14 DIAGNOSIS — C9000 Multiple myeloma not having achieved remission: Secondary | ICD-10-CM

## 2018-04-14 NOTE — Progress Notes (Signed)
Marland Kitchen    HEMATOLOGY/ONCOLOGY CLINIC NOTE  Date of Service: 04/15/18     Patient Care Team: Lanice Shirts, MD as PCP - General (Internal Medicine) Jeralene Peters, DDS as Dietitian (Periodontics)  Orthopedics - Dr. Layne Benton from [facility]   CHIEF COMPLAINTS/PURPOSE OF CONSULTATION:   F/u for newly diagnosed multiple myeloma  HISTORY OF PRESENTING ILLNESS:   Cassidy Barrett is a wonderful 72 y.o. female who has been referred to [place] by Dr Wandra Feinstein for evaluation and management of possible plasma cell dyscrasia.  Patient has a history of liver cirrhosis with ascites, alcohol abuse, emphysema, macrocytic anemia, osteoporosis and was seen by orthopedics for spinal vertebral compression fractures in the setting of osteoporosis.  She was noted to have multiple significant lab abnormalities including an elevated alkaline phosphatase and high total protein level.  Chronic appealing L1-L2 compression fractures  Patient subsequently had a bone scan to evaluate these compression fractures on 09/26/2015.  This showed mild increased activity within the superior endplates of L3, L4 and L5 likely representing subacute fractures. Increased activity within the T2 vertebral body compatible with known compression fracture. No evidence of suggest bony metastatic disease.  As a part of her workup she also had an SPEP done which shows 3 monoclonal protein bands for a total M protein of 2.3 g/dL. On IFE this is noted to be monoclonal IgA kappa protein. Her sedimentation rate was also elevated at 73.  She was referred to [place] for further evaluation to rule out multiple myeloma. Patient notes her mid and lower back pain related to her compression fractures. Also notes some left hip pain. Her labs from January 2017 did not show any anemia, hypercalcemia or renal failure.  She notes that she did have a colonoscopy in 2015 which demonstrated a 4.5 cm rectal polyp which was  removed piecemeal. Pathology showed a tubulovillous adenoma. Patient was also noted to have significant diverticulosis and was not recommended routine colonoscopies. Given her CT scan showing concern of rectal mass she was referred back to Dr. Henrene Pastor at [facility] for consideration of a sigmoidoscopy/colonoscopy. Patient notes no overt rectal bleeding. No obstructive symptoms. No change in bowel habits. No new abdominal pain. She also needs to connect with gi for management of her liver cirrhosis and related issues.   INTERVAL HISTORY:  Cassidy Barrett is here for her scheduled followup for newly diagnosed myeloma. The patient's last visit with [place] was on 03/29/18. She is accompanied today by her partner. The pt reports that she is doing well overall.   The pt reports that she has tolerated Velcade and Dexamethasone well and has not had any skin rashes, mouth sores, diarrhea, any other concerns for side effects.   The pt notes that she will be seeing Dr. Carlean Purl in GI on October 20 for management of her liver concerns.   She notes that she will be seeing Dr. Layne Benton for evaluation of her back pain.   She is currently taking 35m Lasix and 143mSpironolactone each day. She continues on Acyclovir as well.   Lab results today (04/14/18) of CBC w/diff, CMP, and Reticulocytes is as follows: all values are WNL except for RBC at 2.92, HGB at 10.6, HCT at 31.3, MCV at 107.2, MCH at 36.3, RDW at 19.1, Monocytes abs at 1.1k, Sodium at 129, Chloride at 97, Calcium at 8.0, Total Protein at 8.2, Albumin at 1.6, AST at 54, Alk Phos at 259.  On review of systems, pt  reports occasional ankle swelling, stable energy levels, stable back pain, and denies nausea, skin rashes, diarrhea, mouth sores, fevers, chills, night sweats, leg swelling, peripheral neuropathy, dental concerns, and any other symptoms.    MEDICAL HISTORY:  Past Medical History:  Diagnosis Date  . Alcohol abuse   . Arthritis   . Ascites   .  Atherosclerosis of aorta ([place])   . Cholelithiasis   . Cirrhosis ([place])   . Complication of anesthesia    Difficult time waking up after having biospy with colonoscopy  . Compression fracture of lumbar vertebra ([facility])     L1 and L2  . Emphysema of lung ([place])   . History of multiple myeloma    smoldering  . Hyponatremia   . Macrocytic anemia   . 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 Osteoporosis   . Pneumonia 04/2014  . Tobacco abuse   . Tubular adenoma   . Vertebral compression fracture ([facility])    ?? 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  SURGICAL HISTORY: Past Surgical History:  Procedure Laterality Date  . COLONOSCOPY N/A 05/24/2014   Procedure: COLONOSCOPY;  Surgeon: Gatha Mayer, MD;  Location: [facility];  Service: Endoscopy;  Laterality: N/A;  MAC if possible ultraslim colonoscope and gastroscope needed  . FLEXIBLE SIGMOIDOSCOPY N/A 05/21/2014   Procedure: FLEXIBLE SIGMOIDOSCOPY;  Surgeon: Gatha Mayer, MD;  Location: [facility];  Service: Endoscopy;  Laterality: N/A;  . KYPHOPLASTY N/A 12/12/2014   Procedure: KYPHOPLASTY;  Surgeon: Phylliss Bob, MD;  Location: [facility];  Service: Orthopedics;  Laterality: N/A;  T10 kyphoplasty  . MANDIBLE FRACTURE SURGERY     X 2  . TONSILLECTOMY      SOCIAL HISTORY: Social History   Socioeconomic History  . Marital status: Married    Spouse name: Not on file  . Number of children: Not on file  . Years of education: Not on file  . Highest education level: Not on file  Occupational History  . Occupation: retired -[employer]  Social Needs  . Financial resource strain: Not on file  . Food insecurity:    Worry: Not on file    Inability: Not on file  . Transportation needs:    Medical: Not on file    Non-medical: Not on file  Tobacco Use  . Smoking status: Former Smoker    Packs/day: 0.25    Years: 46.00    Pack years: 11.50    Types: Cigarettes    Last attempt to quit: 02/24/2017    Years  since quitting: 1.1  . Smokeless tobacco: Never Used  Substance and Sexual Activity  . Alcohol use: Yes    Alcohol/week: 0.0 standard drinks    Comment: 1-4 alcoholic beverages daily  . Drug use: No  . Sexual activity: Not on file  Lifestyle  . Physical activity:    Days per week: Not on file    Minutes per session: Not on file  . Stress: Not on file  Relationships  . Social connections:    Talks on phone: Not on file    Gets together: Not on file    Attends religious service: Not on file    Active member of club or organization: Not on file    Attends meetings of clubs or organizations: Not on file    Relationship status: Not on file  . Intimate partner violence:    Fear of current or ex partner: Not on file    Emotionally abused: Not on  file    Physically abused: Not on file    Forced sexual activity: Not on file  Other Topics Concern  . Not on file  Social History Narrative  . Not on file   Patient notes that she is drinking about 1 beer and 1 hard liquor every day - was counseled regarding absolute alcohol abstinence.  FAMILY HISTORY:  No family history of colon cancer.  ALLERGIES:  is allergic to bee venom; benadryl [diphenhydramine]; hydrocodone-acetaminophen; oxycodone; tape; and valium [diazepam].  MEDICATIONS:  Current Outpatient Medications  Medication Sig Dispense Refill  . acyclovir (ZOVIRAX) 400 MG tablet Take 1 tablet (400 mg total) by mouth 2 (two) times daily. 60 tablet 3  . albuterol (PROVENTIL HFA;VENTOLIN HFA) 108 (90 Base) MCG/ACT inhaler INHALE 2 PUFFS INTO THE LUNGS EVERY 6 HOURS AS NEEDED FOR WHEEZING OR SHORTNESS OF BREATH 18 g 1  . ANORO ELLIPTA 62.5-25 MCG/INH AEPB INHALE 1 PUFF INTO THE LUNGS DAILY 60 each 11  . calcitonin, salmon, (MIACALCIN/FORTICAL) 200 UNIT/ACT nasal spray U 1 SPRAY NASALLY D. ALTERNATING NOSTRILS  3  . ergocalciferol (VITAMIN D2) 50000 units capsule Take 1 capsule (50,000 Units total) by mouth once a week. 12 capsule 2  .  folic acid (FOLVITE) 1 MG tablet Take 1 tablet (1 mg total) by mouth daily. 30 tablet 3  . furosemide (LASIX) 20 MG tablet Take 0.5 tablets (10 mg total) by mouth daily. 30 tablet 1  . lidocaine (LIDODERM) 5 % Place 1-2 patches onto the skin daily as needed (severe pain). Remove & Discard patch within 12 hours or as directed by MD    . magnesium oxide (MAG-OX) 400 (241.3 Mg) MG tablet Take 1 tablet (400 mg total) by mouth daily. 30 tablet 2  . Menthol, Topical Analgesic, (BIOFREEZE EX) Apply topically as directed.    . ondansetron (ZOFRAN) 8 MG tablet Take 1 tablet (8 mg total) by mouth 2 (two) times daily as needed (Nausea or vomiting). (Patient not taking: Reported on 04/15/2018) 30 tablet 1  . OVER THE COUNTER MEDICATION Apply 400 mg topically daily as needed (arthritis/back pain). CBD cream    . pantoprazole (PROTONIX) 40 MG tablet Take 1 tablet (40 mg total) by mouth daily. 90 tablet 1  . potassium chloride (KLOR-CON) 20 MEQ packet Take 40 mEq by mouth 2 (two) times daily. 180 tablet 0  . potassium chloride SA (K-DUR,KLOR-CON) 20 MEQ tablet Take 1 tablet (20 mEq total) by mouth 2 (two) times daily. (Patient not taking: Reported on 04/15/2018) 180 tablet 0  . spironolactone (ALDACTONE) 50 MG tablet Take 1 tablet (50 mg total) by mouth daily. 30 tablet 2   No current facility-administered medications for this visit.     REVIEW OF SYSTEMS:    A 10+ POINT REVIEW OF SYSTEMS WAS OBTAINED including neurology, dermatology, psychiatry, cardiac, respiratory, lymph, extremities, GI, GU, Musculoskeletal, constitutional, breasts, reproductive, HEENT.  All pertinent positives are noted in the HPI.  All others are negative.   PHYSICAL EXAMINATION: ECOG PERFORMANCE STATUS: 2 - Symptomatic, <50% confined to bed  Vitals:   04/15/18 1014  BP: 106/69  Pulse: (!) 111  Resp: 17  Temp: 97.9 F (36.6 C)  SpO2: 95%   Filed Weights   04/15/18 1014  Weight: 72 lb 9.6 oz (32.9 kg)   .Body mass index is  14.66 kg/m.   GENERAL:alert, in no acute distress and comfortable SKIN: no acute rashes, no significant lesions EYES: conjunctiva are pink and non-injected, sclera anicteric OROPHARYNX: MMM, no  exudates, no oropharyngeal erythema or ulceration NECK: supple, no JVD LYMPH:  no palpable lymphadenopathy in the cervical, axillary or inguinal regions LUNGS: clear to auscultation b/l with normal respiratory effort HEART: regular rate & rhythm ABDOMEN:  normoactive bowel sounds , non tender, not distended. No palpable hepatosplenomegaly.  Extremity: no pedal edema PSYCH: alert & oriented x 3 with fluent speech NEURO: no focal motor/sensory deficits   LABORATORY DATA:  I have reviewed the data as listed  . CBC Latest Ref Rng & Units 04/15/2018 04/08/2018 03/29/2018  WBC 3.9 - 10.3 K/uL 5.7 5.8 6.5  Hemoglobin 11.6 - 15.9 g/dL 10.6(L) 10.0(L) 9.6(L)  Hematocrit 34.8 - 46.6 % 31.3(L) 29.0(L) 28.5(L)  Platelets 145 - 400 K/uL 193 211 186    . CMP Latest Ref Rng & Units 04/15/2018 04/08/2018 03/29/2018  Glucose 70 - 99 mg/dL 99 124(H) 115(H)  BUN 8 - 23 mg/dL _0 Creatinine 0.44 - 1.00 mg/dL 0.57 0.64 0.68  Sodium 135 - 145 mmol/L 129(L) 128(L) 130(L)  Potassium 3.5 - 5.1 mmol/L 4.5 4.3 4.2  Chloride 98 - 111 mmol/L 97(L) 94(L) 93(L)  CO2 22 - 32 mmol/L _1 Calcium 8.9 - 10.3 mg/dL 8.0(L) 8.9 8.2(L)  Total Protein 6.5 - 8.1 g/dL 8.2(H) 8.7(H) 8.4(H)  Total Bilirubin 0.3 - 1.2 mg/dL 1.2 1.6(H) 1.5(H)  Alkaline Phos 38 - 126 U/L 259(H) 299(H) 274(H)  AST 15 - 41 U/L 54(H) 69(H) 65(H)  ALT 0 - 44 U/L _2 Component     Latest Ref Rng & Units 02/18/2018 03/02/2018  TIBC     250 - 450 ug/dL  127 (L)  UIBC     118 - 369 ug/dL  18 (L)  Iron     27 - 139 ug/dL  109  Iron Saturation     15 - 55 %  86 (HH)  Hepatitis C Ab     NON-REACTI  NON-REACTIVE  SIGNAL TO CUT-OFF     <1.00  0.02  AFP Tumor Marker     ng/mL 2.9   Hepatitis A AB,Total     NON-REACTI  NON-REACTIVE    Hep B S Ab     NON-REACTI  NON-REACTIVE  Hepatitis B Surface Ag     NON-REACTI  NON-REACTIVE   . Lab Results  Component Value Date   IRON 109 03/02/2018   TIBC 127 (L) 03/02/2018   IRONPCTSAT 86 (HH) 03/02/2018   (Iron and TIBC)  Lab Results  Component Value Date   FERRITIN >1500.0 (H) 02/18/2018    10/16/15 BM Bx:   03/21/18 BM Bx:    RADIOGRAPHIC STUDIES: I have personally reviewed the radiological images as listed and agreed with the findings in the report. Nm Pet Image Initial (pi) Skull Base To Thigh  Result Date: 03/25/2018 CLINICAL DATA: INITIAL: CLINICAL DATA: INITIAL Initial treatment strategy for multiple myeloma. EXAM: NUCLEAR MEDICINE PET SKULL BASE TO THIGH TECHNIQUE: 5.3 mCi F-18 FDG was injected intravenously. Full-ring PET imaging was performed from the skull base to thigh after the radiotracer. CT data was obtained and used for attenuation correction and anatomic localization. Fasting blood glucose: 115 mg/dl COMPARISON:  None. FINDINGS: Mediastinal blood pool activity: SUV max 1.6 NECK: No hypermetabolic lymph nodes in the neck. Incidental CT findings: none CHEST: No hypermetabolic mediastinal or hilar nodes. No suspicious pulmonary nodules on the CT scan. Incidental CT findings: Severe upper lobe emphysema. ABDOMEN/PELVIS: No abnormal hypermetabolic activity within the  liver, pancreas, adrenal glands, or spleen. No hypermetabolic lymph nodes in the abdomen or pelvis. Incidental CT findings: Nephrocalcinosis noted. Atherosclerotic calcification of the aorta. SKELETON: No focal metabolic activity within the skeleton to localize active multiple myeloma. No plasmacytoma identified. Incidental CT findings: Kyphoplasty noted in the T12 vertebral body with focal kyphosis. IMPRESSION: 1. No evidence of active multiple myeloma within the skeleton. 2. No soft tissue mass to suggest plasmacytoma. 3. Aortic Atherosclerosis (ICD10-I70.0) and Emphysema (ICD10-J43.9). Electronically  Signed   By: Suzy Bouchard M.D.   On: 03/25/2018 11:34   Ct Biopsy  Result Date: 03/21/2018 INDICATION: History of multiple myeloma. Please perform CT-guided bone marrow biopsy for tissue diagnostic purposes. EXAM: CT-GUIDED BONE MARROW BIOPSY AND ASPIRATION MEDICATIONS: None ANESTHESIA/SEDATION: Fentanyl 100 mcg IV; Versed 2 mg IV Sedation Time: 13 Minutes; The patient was continuously monitored during the procedure by the interventional radiology nurse under my direct supervision. COMPLICATIONS: None immediate. PROCEDURE: Informed consent was obtained from the patient following an explanation of the procedure, risks, benefits and alternatives. The patient understands, agrees and consents for the procedure. All questions were addressed. A time out was performed prior to the initiation of the procedure. The patient was positioned left lateral decubitus and non-contrast localization CT was performed of the pelvis to demonstrate the iliac marrow spaces. The operative site was prepped and draped in the usual sterile fashion. Under sterile conditions and local anesthesia, a 22 gauge spinal needle was utilized for procedural planning. Next, an 11 gauge coaxial bone biopsy needle was advanced into the left iliac marrow space. Needle position was confirmed with CT imaging. Initially, bone marrow aspiration was performed. Next, a bone marrow biopsy was obtained with the 11 gauge outer bone marrow device. The 11 gauge coaxial bone biopsy needle was re-advanced into a slightly different location within the left iliac marrow space, positioning was confirmed and an additional bone marrow biopsy was obtained. Samples were prepared with the cytotechnologist and deemed adequate. The needle was removed intact. Hemostasis was obtained with compression and a dressing was placed. The patient tolerated the procedure well without immediate post procedural complication. IMPRESSION: Successful CT guided left iliac bone marrow  aspiration and core biopsy. Electronically Signed   By: Sandi Mariscal M.D.   On: 03/21/2018 11:15   Ct Bone Marrow Biopsy & Aspiration  Result Date: 03/21/2018 INDICATION: History of multiple myeloma. Please perform CT-guided bone marrow biopsy for tissue diagnostic purposes. EXAM: CT-GUIDED BONE MARROW BIOPSY AND ASPIRATION MEDICATIONS: None ANESTHESIA/SEDATION: Fentanyl 100 mcg IV; Versed 2 mg IV Sedation Time: 13 Minutes; The patient was continuously monitored during the procedure by the interventional radiology nurse under my direct supervision. COMPLICATIONS: None immediate. PROCEDURE: Informed consent was obtained from the patient following an explanation of the procedure, risks, benefits and alternatives. The patient understands, agrees and consents for the procedure. All questions were addressed. A time out was performed prior to the initiation of the procedure. The patient was positioned left lateral decubitus and non-contrast localization CT was performed of the pelvis to demonstrate the iliac marrow spaces. The operative site was prepped and draped in the usual sterile fashion. Under sterile conditions and local anesthesia, a 22 gauge spinal needle was utilized for procedural planning. Next, an 11 gauge coaxial bone biopsy needle was advanced into the left iliac marrow space. Needle position was confirmed with CT imaging. Initially, bone marrow aspiration was performed. Next, a bone marrow biopsy was obtained with the 11 gauge outer bone marrow device. The 11 gauge coaxial  bone biopsy needle was re-advanced into a slightly different location within the left iliac marrow space, positioning was confirmed and an additional bone marrow biopsy was obtained. Samples were prepared with the cytotechnologist and deemed adequate. The needle was removed intact. Hemostasis was obtained with compression and a dressing was placed. The patient tolerated the procedure well without immediate post procedural  complication. IMPRESSION: Successful CT guided left iliac bone marrow aspiration and core biopsy. Electronically Signed   By: Sandi Mariscal M.D.   On: 03/21/2018 11:15     ASSESSMENT & PLAN:   72 y.o. Caucasian female with severe osteoporosis and vertebral compression fractures  #1 IgA kappa Multiple myeloma    M protein level increased significantly to 3 ( 1.5-->1.8 ---> 1.9-->1.8-->1.7--> 3-->3.4-->3.7 Noted to have new anemia with a hgb of 10 Labs with no signs of  hypercalcemia or renal failure. No overt focal new bone pains. Bm Bx shows 21% abnormal plasma cells. Rpt Bone Survey on 05/12/2016 -- showed no evidence of lytic lesions.  02/28/18 Bone Survey which revealed New T7 compression deformity. Multiple chronic compression deformities are noted. No definitive lytic lesions are identified.   03/25/18 PET/CT revealed No evidence of active multiple myeloma within the skeleton. 2. No soft tissue mass to suggest plasmacytoma. 3. Aortic Atherosclerosis and Emphysema. 03/21/18 BM Bx revealed 30% atypical plasma cells   PLAN: -Will hold Calcitonin spray -Discussed that triple drug therapy might be difficult to tolerate with concern for her poor liver functions -Discussed that with Velcade treatment we will monitor for neuropathy and shingles outbreak -Discussed that I would not recommend a BM transplant but that if the pt would like to discuss this with [facility] I would provide a referral for a second opinion  -Advised that the pt return to care with her previously seen eye doctor -Continue taking Vitamin D, and Vitamin B complex -Discussed pt labwork today, 04/14/18; blood counts are stable, liver functions continue to be high with her liver issues -Continue follow up with Dr. Carlean Purl in GI -The pt has no prohibitive toxicities from continuing dose reduced Velcade and 83m Dexamethasone at this time.   -Her dentists approved her for Xgeva use and will begin this at next visit every 4  weeks  -Continue Acyclovir  -Will consider [place] further into treatment to ensure tolerance to treatment  -Will see the pt back in 3 weeks   #2 liver cirrhosis with known varices. Increased bilirubin levels. Plan -Counseled on absolute alcohol cessation. -continue f/u with PCP  #3 osteoporosis with vertebral compression fractures.  Plan  -Patient has been following with orthopedics Dr. RWandra Feinsteinto manage her osteoporosis . -continue aggressive vitamin D replacement . -Pt has had full dental extraction and has recovered well with dental clearance to begin Xgeva.  -mx per orthopedics.   -continue Weekly Velcade - please schedule next 4 treatments -Starting XMarchelle Folksfrom next week -labs weekly -RTC with Dr kIrene Limboin 3 weeks    All of the patients and her female partners questions were answered to their apparent satisfaction. The patient knows to call the clinic with any problems, questions or concerns.  The total time spent in the appt was 25 minutes and more than 50% was on counseling and direct patient cares.    GSullivan LoneMD MShickleyAAHIVMS SShenando[facility] Memorial HospitalCFirst SurgicenterHematology/Oncology Physician [facility] (Office):       [phone](Work cell):  [phone](Fax):  [phone]  I, Baldwin Jamaica, am acting as a scribe for Dr. Irene Limbo  .I have reviewed the above documentation for accuracy and completeness, and I agree with the above. Brunetta Genera MD

## 2018-04-15 ENCOUNTER — Inpatient Hospital Stay: Payer: Medicare Other

## 2018-04-15 ENCOUNTER — Telehealth: Payer: Self-pay | Admitting: Hematology

## 2018-04-15 ENCOUNTER — Inpatient Hospital Stay (HOSPITAL_BASED_OUTPATIENT_CLINIC_OR_DEPARTMENT_OTHER): Payer: Medicare Other | Admitting: Hematology

## 2018-04-15 ENCOUNTER — Encounter: Payer: Self-pay | Admitting: Hematology

## 2018-04-15 VITALS — BP 106/69 | HR 111 | Temp 97.9°F | Resp 17 | Ht 59.0 in | Wt 72.6 lb

## 2018-04-15 DIAGNOSIS — C9000 Multiple myeloma not having achieved remission: Secondary | ICD-10-CM

## 2018-04-15 DIAGNOSIS — M810 Age-related osteoporosis without current pathological fracture: Secondary | ICD-10-CM | POA: Diagnosis not present

## 2018-04-15 DIAGNOSIS — K7460 Unspecified cirrhosis of liver: Secondary | ICD-10-CM

## 2018-04-15 DIAGNOSIS — E871 Hypo-osmolality and hyponatremia: Secondary | ICD-10-CM | POA: Diagnosis not present

## 2018-04-15 DIAGNOSIS — D649 Anemia, unspecified: Secondary | ICD-10-CM | POA: Diagnosis not present

## 2018-04-15 DIAGNOSIS — Z7189 Other specified counseling: Secondary | ICD-10-CM

## 2018-04-15 DIAGNOSIS — Z5112 Encounter for antineoplastic immunotherapy: Secondary | ICD-10-CM | POA: Diagnosis not present

## 2018-04-15 LAB — CBC WITH DIFFERENTIAL (CANCER CENTER ONLY)
BASOS ABS: 0 10*3/uL (ref 0.0–0.1)
Basophils Relative: 1 %
EOS PCT: 3 %
Eosinophils Absolute: 0.2 10*3/uL (ref 0.0–0.5)
HEMATOCRIT: 31.3 % — AB (ref 34.8–46.6)
Hemoglobin: 10.6 g/dL — ABNORMAL LOW (ref 11.6–15.9)
LYMPHS ABS: 0.9 10*3/uL (ref 0.9–3.3)
LYMPHS PCT: 16 %
MCH: 36.3 pg — AB (ref 25.1–34.0)
MCHC: 33.9 g/dL (ref 31.5–36.0)
MCV: 107.2 fL — AB (ref 79.5–101.0)
Monocytes Absolute: 1.1 10*3/uL — ABNORMAL HIGH (ref 0.1–0.9)
Monocytes Relative: 20 %
NEUTROS ABS: 3.4 10*3/uL (ref 1.5–6.5)
NEUTROS PCT: 60 %
PLATELETS: 193 10*3/uL (ref 145–400)
RBC: 2.92 MIL/uL — ABNORMAL LOW (ref 3.70–5.45)
RDW: 19.1 % — AB (ref 11.2–14.5)
WBC: 5.7 10*3/uL (ref 3.9–10.3)

## 2018-04-15 LAB — CMP (CANCER CENTER ONLY)
ALK PHOS: 259 U/L — AB (ref 38–126)
ALT: 14 U/L (ref 0–44)
ANION GAP: 8 (ref 5–15)
AST: 54 U/L — ABNORMAL HIGH (ref 15–41)
Albumin: 1.6 g/dL — ABNORMAL LOW (ref 3.5–5.0)
BILIRUBIN TOTAL: 1.2 mg/dL (ref 0.3–1.2)
BUN: 8 mg/dL (ref 8–23)
CALCIUM: 8 mg/dL — AB (ref 8.9–10.3)
CO2: 24 mmol/L (ref 22–32)
CREATININE: 0.57 mg/dL (ref 0.44–1.00)
Chloride: 97 mmol/L — ABNORMAL LOW (ref 98–111)
Glucose, Bld: 99 mg/dL (ref 70–99)
Potassium: 4.5 mmol/L (ref 3.5–5.1)
SODIUM: 129 mmol/L — AB (ref 135–145)
TOTAL PROTEIN: 8.2 g/dL — AB (ref 6.5–8.1)

## 2018-04-15 MED ORDER — DEXAMETHASONE 4 MG PO TABS
ORAL_TABLET | ORAL | Status: AC
  Filled 2018-04-15: qty 5

## 2018-04-15 MED ORDER — DEXAMETHASONE 4 MG PO TABS
20.0000 mg | ORAL_TABLET | Freq: Once | ORAL | Status: AC
  Administered 2018-04-15: 20 mg via ORAL

## 2018-04-15 MED ORDER — PROCHLORPERAZINE MALEATE 10 MG PO TABS
10.0000 mg | ORAL_TABLET | Freq: Once | ORAL | Status: AC
  Administered 2018-04-15: 10 mg via ORAL

## 2018-04-15 MED ORDER — PROCHLORPERAZINE MALEATE 10 MG PO TABS
ORAL_TABLET | ORAL | Status: AC
  Filled 2018-04-15: qty 1

## 2018-04-15 MED ORDER — BORTEZOMIB CHEMO SQ INJECTION 3.5 MG (2.5MG/ML)
1.0000 mg/m2 | Freq: Once | INTRAMUSCULAR | Status: AC
  Administered 2018-04-15: 1.25 mg via SUBCUTANEOUS
  Filled 2018-04-15: qty 0.5

## 2018-04-15 NOTE — Telephone Encounter (Signed)
Scheduled appt per 9/20 los - gave patient AVS and calender per los. - unable to schedule cycle 2 because patient is going to be out of town - appt is scheduled on 10/09 then every Friday after.

## 2018-04-15 NOTE — Patient Instructions (Signed)
[facility] Discharge Instructions for Patients Receiving Chemotherapy  Today you received the following chemotherapy agents Velcade  To help prevent nausea and vomiting after your treatment, we encourage you to take your nausea medication.   If you develop nausea and vomiting that is not controlled by your nausea medication, call the clinic.   BELOW ARE SYMPTOMS THAT SHOULD BE REPORTED IMMEDIATELY:  *FEVER GREATER THAN 100.5 F  *CHILLS WITH OR WITHOUT FEVER  NAUSEA AND VOMITING THAT IS NOT CONTROLLED WITH YOUR NAUSEA MEDICATION  *UNUSUAL SHORTNESS OF BREATH  *UNUSUAL BRUISING OR BLEEDING  TENDERNESS IN MOUTH AND THROAT WITH OR WITHOUT PRESENCE OF ULCERS  *URINARY PROBLEMS  *BOWEL PROBLEMS  UNUSUAL RASH Items with * indicate a potential emergency and should be followed up as soon as possible.  Feel free to call the clinic should you have any questions or concerns. The clinic phone number is [phone].  Please show the [facility] at check-in to the Emergency Department and triage nurse.  Bortezomib injection What is this medicine? BORTEZOMIB (bor TEZ oh mib) is a medicine that targets proteins in cancer cells and stops the cancer cells from growing. It is used to treat multiple myeloma and mantle-cell lymphoma. This medicine may be used for other purposes; ask your health care provider or pharmacist if you have questions. COMMON BRAND NAME(S): Velcade What should I tell my health care provider before I take this medicine? They need to know if you have any of these conditions: -diabetes -heart disease -irregular heartbeat -liver disease -on hemodialysis -low blood counts, like low white blood cells, platelets, or hemoglobin -peripheral neuropathy -taking medicine for blood pressure -an unusual or allergic reaction to bortezomib, mannitol, boron, other medicines, foods, dyes, or preservatives -pregnant or trying to get  pregnant -breast-feeding How should I use this medicine? This medicine is for injection into a vein or for injection under the skin. It is given by a health care professional in a hospital or clinic setting. Talk to your pediatrician regarding the use of this medicine in children. Special care may be needed. Overdosage: If you think you have taken too much of this medicine contact a poison control center or emergency room at once. NOTE: This medicine is only for you. Do not share this medicine with others. What if I miss a dose? It is important not to miss your dose. Call your doctor or health care professional if you are unable to keep an appointment. What may interact with this medicine? This medicine may interact with the following medications: -ketoconazole -rifampin -ritonavir -St. John's Wort This list may not describe all possible interactions. Give your health care provider a list of all the medicines, herbs, non-prescription drugs, or dietary supplements you use. Also tell them if you smoke, drink alcohol, or use illegal drugs. Some items may interact with your medicine. What should I watch for while using this medicine? You may get drowsy or dizzy. Do not drive, use machinery, or do anything that needs mental alertness until you know how this medicine affects you. Do not stand or sit up quickly, especially if you are an older patient. This reduces the risk of dizzy or fainting spells. In some cases, you may be given additional medicines to help with side effects. Follow all directions for their use. Call your doctor or health care professional for advice if you get a fever, chills or sore throat, or other symptoms of a cold or flu. Do not treat yourself. This  drug decreases your body's ability to fight infections. Try to avoid being around people who are sick. This medicine may increase your risk to bruise or bleed. Call your doctor or health care professional if you notice any unusual  bleeding. You may need blood work done while you are taking this medicine. In some patients, this medicine may cause a serious brain infection that may cause death. If you have any problems seeing, thinking, speaking, walking, or standing, tell your doctor right away. If you cannot reach your doctor, urgently seek other source of medical care. Check with your doctor or health care professional if you get an attack of severe diarrhea, nausea and vomiting, or if you sweat a lot. The loss of too much body fluid can make it dangerous for you to take this medicine. Do not become pregnant while taking this medicine or for at least 2 months after stopping it. Women should inform their doctor if they wish to become pregnant or think they might be pregnant. Men should not father a child while taking this medicine and for at least 2 months after stopping it. There is a potential for serious side effects to an unborn child. Talk to your health care professional or pharmacist for more information. Do not breast-feed an infant while taking this medicine or for 2 months after stopping it. This medicine may interfere with the ability to have a child. You should talk with your doctor or health care professional if you are concerned about your fertility. What side effects may I notice from receiving this medicine? Side effects that you should report to your doctor or health care professional as soon as possible: -allergic reactions like skin rash, itching or hives, swelling of the face, lips, or tongue -breathing problems -changes in hearing -changes in vision -fast, irregular heartbeat -feeling faint or lightheaded, falls -pain, tingling, numbness in the hands or feet -right upper belly pain -seizures -swelling of the ankles, feet, hands -unusual bleeding or bruising -unusually weak or tired -vomiting -yellowing of the eyes or skin Side effects that usually do not require medical attention (report to your  doctor or health care professional if they continue or are bothersome): -changes in emotions or moods -constipation -diarrhea -loss of appetite -headache -irritation at site where injected -nausea This list may not describe all possible side effects. Call your doctor for medical advice about side effects. You may report side effects to FDA at 1-800-FDA-1088. Where should I keep my medicine? This drug is given in a hospital or clinic and will not be stored at home. NOTE: This sheet is a summary. It may not cover all possible information. If you have questions about this medicine, talk to your doctor, pharmacist, or health care provider.  2018 Elsevier/Gold Standard (2016-06-11 15:53:51)  Denosumab injection What is this medicine? DENOSUMAB (den oh sue mab) slows bone breakdown. Prolia is used to treat osteoporosis in women after menopause and in men. Xgeva is used to treat a high calcium level due to cancer and to prevent bone fractures and other bone problems caused by multiple myeloma or cancer bone metastases. Xgeva is also used to treat giant cell tumor of the bone. This medicine may be used for other purposes; ask your health care provider or pharmacist if you have questions. COMMON BRAND NAME(S): Prolia, XGEVA What should I tell my health care provider before I take this medicine? They need to know if you have any of these conditions: -dental disease -having surgery or tooth extraction -  infection -kidney disease -low levels of calcium or Vitamin D in the blood -malnutrition -on hemodialysis -skin conditions or sensitivity -thyroid or parathyroid disease -an unusual reaction to denosumab, other medicines, foods, dyes, or preservatives -pregnant or trying to get pregnant -breast-feeding How should I use this medicine? This medicine is for injection under the skin. It is given by a health care professional in a hospital or clinic setting. If you are getting Prolia, a special  MedGuide will be given to you by the pharmacist with each prescription and refill. Be sure to read this information carefully each time. For Prolia, talk to your pediatrician regarding the use of this medicine in children. Special care may be needed. For Xgeva, talk to your pediatrician regarding the use of this medicine in children. While this drug may be prescribed for children as young as 13 years for selected conditions, precautions do apply. Overdosage: If you think you have taken too much of this medicine contact a poison control center or emergency room at once. NOTE: This medicine is only for you. Do not share this medicine with others. What if I miss a dose? It is important not to miss your dose. Call your doctor or health care professional if you are unable to keep an appointment. What may interact with this medicine? Do not take this medicine with any of the following medications: -other medicines containing denosumab This medicine may also interact with the following medications: -medicines that lower your chance of fighting infection -steroid medicines like prednisone or cortisone This list may not describe all possible interactions. Give your health care provider a list of all the medicines, herbs, non-prescription drugs, or dietary supplements you use. Also tell them if you smoke, drink alcohol, or use illegal drugs. Some items may interact with your medicine. What should I watch for while using this medicine? Visit your doctor or health care professional for regular checks on your progress. Your doctor or health care professional may order blood tests and other tests to see how you are doing. Call your doctor or health care professional for advice if you get a fever, chills or sore throat, or other symptoms of a cold or flu. Do not treat yourself. This drug may decrease your body's ability to fight infection. Try to avoid being around people who are sick. You should make sure you get  enough calcium and vitamin D while you are taking this medicine, unless your doctor tells you not to. Discuss the foods you eat and the vitamins you take with your health care professional. See your dentist regularly. Brush and floss your teeth as directed. Before you have any dental work done, tell your dentist you are receiving this medicine. Do not become pregnant while taking this medicine or for 5 months after stopping it. Talk with your doctor or health care professional about your birth control options while taking this medicine. Women should inform their doctor if they wish to become pregnant or think they might be pregnant. There is a potential for serious side effects to an unborn child. Talk to your health care professional or pharmacist for more information. What side effects may I notice from receiving this medicine? Side effects that you should report to your doctor or health care professional as soon as possible: -allergic reactions like skin rash, itching or hives, swelling of the face, lips, or tongue -bone pain -breathing problems -dizziness -jaw pain, especially after dental work -redness, blistering, peeling of the skin -signs and symptoms of infection   like fever or chills; cough; sore throat; pain or trouble passing urine -signs of low calcium like fast heartbeat, muscle cramps or muscle pain; pain, tingling, numbness in the hands or feet; seizures -unusual bleeding or bruising -unusually weak or tired Side effects that usually do not require medical attention (report to your doctor or health care professional if they continue or are bothersome): -constipation -diarrhea -headache -joint pain -loss of appetite -muscle pain -runny nose -tiredness -upset stomach This list may not describe all possible side effects. Call your doctor for medical advice about side effects. You may report side effects to FDA at 1-800-FDA-1088. Where should I keep my medicine? This medicine is  only given in a clinic, doctor's office, or other health care setting and will not be stored at home. NOTE: This sheet is a summary. It may not cover all possible information. If you have questions about this medicine, talk to your doctor, pharmacist, or health care provider.  2018 Elsevier/Gold Standard (2016-08-04 19:17:21)   

## 2018-04-15 NOTE — Progress Notes (Signed)
Ok to treat per Dr. Irene Limbo despite labs and heart rate today.

## 2018-04-20 DIAGNOSIS — E871 Hypo-osmolality and hyponatremia: Secondary | ICD-10-CM | POA: Diagnosis not present

## 2018-04-20 DIAGNOSIS — C9000 Multiple myeloma not having achieved remission: Secondary | ICD-10-CM | POA: Diagnosis not present

## 2018-04-20 DIAGNOSIS — K7460 Unspecified cirrhosis of liver: Secondary | ICD-10-CM | POA: Diagnosis not present

## 2018-04-20 DIAGNOSIS — S32050K Wedge compression fracture of fifth lumbar vertebra, subsequent encounter for fracture with nonunion: Secondary | ICD-10-CM

## 2018-04-20 DIAGNOSIS — E876 Hypokalemia: Secondary | ICD-10-CM | POA: Diagnosis not present

## 2018-04-20 DIAGNOSIS — S32050A Wedge compression fracture of fifth lumbar vertebra, initial encounter for closed fracture: Secondary | ICD-10-CM | POA: Diagnosis not present

## 2018-04-20 DIAGNOSIS — I700 Atherosclerosis of aorta: Secondary | ICD-10-CM | POA: Diagnosis not present

## 2018-04-20 DIAGNOSIS — B199 Unspecified viral hepatitis without hepatic coma: Secondary | ICD-10-CM | POA: Diagnosis not present

## 2018-04-20 DIAGNOSIS — M810 Age-related osteoporosis without current pathological fracture: Secondary | ICD-10-CM | POA: Diagnosis not present

## 2018-04-20 HISTORY — DX: Wedge compression fracture of fifth lumbar vertebra, subsequent encounter for fracture with nonunion: S32.050K

## 2018-04-22 ENCOUNTER — Inpatient Hospital Stay (HOSPITAL_BASED_OUTPATIENT_CLINIC_OR_DEPARTMENT_OTHER): Payer: Medicare Other | Admitting: Hematology

## 2018-04-22 ENCOUNTER — Inpatient Hospital Stay: Payer: Medicare Other

## 2018-04-22 ENCOUNTER — Encounter: Payer: Self-pay | Admitting: Hematology

## 2018-04-22 VITALS — HR 122

## 2018-04-22 VITALS — BP 109/69 | HR 117 | Temp 97.6°F | Resp 18 | Ht 59.0 in | Wt 73.5 lb

## 2018-04-22 DIAGNOSIS — D649 Anemia, unspecified: Secondary | ICD-10-CM | POA: Diagnosis not present

## 2018-04-22 DIAGNOSIS — Z5112 Encounter for antineoplastic immunotherapy: Secondary | ICD-10-CM | POA: Diagnosis not present

## 2018-04-22 DIAGNOSIS — C9000 Multiple myeloma not having achieved remission: Secondary | ICD-10-CM

## 2018-04-22 DIAGNOSIS — K7460 Unspecified cirrhosis of liver: Secondary | ICD-10-CM

## 2018-04-22 DIAGNOSIS — M810 Age-related osteoporosis without current pathological fracture: Secondary | ICD-10-CM

## 2018-04-22 DIAGNOSIS — E871 Hypo-osmolality and hyponatremia: Secondary | ICD-10-CM | POA: Diagnosis not present

## 2018-04-22 DIAGNOSIS — Z7189 Other specified counseling: Secondary | ICD-10-CM

## 2018-04-22 LAB — CBC WITH DIFFERENTIAL/PLATELET
BASOS ABS: 0 10*3/uL (ref 0.0–0.1)
BASOS PCT: 1 %
EOS PCT: 2 %
Eosinophils Absolute: 0.2 10*3/uL (ref 0.0–0.5)
HCT: 35.5 % (ref 34.8–46.6)
Hemoglobin: 12.1 g/dL (ref 11.6–15.9)
LYMPHS PCT: 15 %
Lymphs Abs: 1 10*3/uL (ref 0.9–3.3)
MCH: 38.1 pg — ABNORMAL HIGH (ref 25.1–34.0)
MCHC: 34.2 g/dL (ref 31.5–36.0)
MCV: 111.5 fL — ABNORMAL HIGH (ref 79.5–101.0)
MONO ABS: 1.5 10*3/uL — AB (ref 0.1–0.9)
Monocytes Relative: 22 %
Neutro Abs: 4.1 10*3/uL (ref 1.5–6.5)
Neutrophils Relative %: 60 %
PLATELETS: 174 10*3/uL (ref 145–400)
RBC: 3.18 MIL/uL — AB (ref 3.70–5.45)
RDW: 19.1 % — AB (ref 11.2–14.5)
WBC: 6.9 10*3/uL (ref 3.9–10.3)

## 2018-04-22 LAB — CMP (CANCER CENTER ONLY)
ALK PHOS: 251 U/L — AB (ref 38–126)
ALT: 18 U/L (ref 0–44)
ANION GAP: 8 (ref 5–15)
AST: 58 U/L — AB (ref 15–41)
Albumin: 2.2 g/dL — ABNORMAL LOW (ref 3.5–5.0)
BUN: 9 mg/dL (ref 8–23)
CALCIUM: 8.7 mg/dL — AB (ref 8.9–10.3)
CO2: 26 mmol/L (ref 22–32)
Chloride: 97 mmol/L — ABNORMAL LOW (ref 98–111)
Creatinine: 0.58 mg/dL (ref 0.44–1.00)
GFR, Est AFR Am: >60 mL/min (ref >60)
GLUCOSE: 100 mg/dL — AB (ref 70–99)
POTASSIUM: 4.9 mmol/L (ref 3.5–5.1)
Sodium: 131 mmol/L — ABNORMAL LOW (ref 135–145)
Total Bilirubin: 1.2 mg/dL (ref 0.3–1.2)
Total Protein: 7.3 g/dL (ref 6.5–8.1)

## 2018-04-22 MED ORDER — DEXAMETHASONE 4 MG PO TABS
ORAL_TABLET | ORAL | Status: AC
  Filled 2018-04-22: qty 5

## 2018-04-22 MED ORDER — BORTEZOMIB CHEMO SQ INJECTION 3.5 MG (2.5MG/ML)
1.0000 mg/m2 | Freq: Once | INTRAMUSCULAR | Status: AC
  Administered 2018-04-22: 1.25 mg via SUBCUTANEOUS
  Filled 2018-04-22: qty 0.5

## 2018-04-22 MED ORDER — PROCHLORPERAZINE MALEATE 10 MG PO TABS
ORAL_TABLET | ORAL | Status: AC
  Filled 2018-04-22: qty 1

## 2018-04-22 MED ORDER — DEXAMETHASONE 4 MG PO TABS
ORAL_TABLET | ORAL | Status: AC
  Filled 2018-04-22: qty 1

## 2018-04-22 MED ORDER — DEXAMETHASONE 4 MG PO TABS
20.0000 mg | ORAL_TABLET | Freq: Once | ORAL | Status: AC
  Administered 2018-04-22: 20 mg via ORAL

## 2018-04-22 MED ORDER — PROCHLORPERAZINE MALEATE 10 MG PO TABS
10.0000 mg | ORAL_TABLET | Freq: Once | ORAL | Status: AC
  Administered 2018-04-22: 10 mg via ORAL

## 2018-04-22 NOTE — Progress Notes (Signed)
Marland Kitchen    HEMATOLOGY/ONCOLOGY CLINIC NOTE  Date of Service: 04/22/18     Patient Care Team: Lanice Shirts, MD as PCP - General (Internal Medicine) Jeralene Peters, DDS as Dietitian (Periodontics)  Orthopedics - Dr. Layne Benton from [facility]   CHIEF COMPLAINTS/PURPOSE OF CONSULTATION:   F/u for newly diagnosed multiple myeloma  HISTORY OF PRESENTING ILLNESS:   Cassidy Barrett is a wonderful 72 y.o. female who has been referred to [place] by Dr Wandra Feinstein for evaluation and management of possible plasma cell dyscrasia.  Patient has a history of liver cirrhosis with ascites, alcohol abuse, emphysema, macrocytic anemia, osteoporosis and was seen by orthopedics for spinal vertebral compression fractures in the setting of osteoporosis.  She was noted to have multiple significant lab abnormalities including an elevated alkaline phosphatase and high total protein level.  Chronic appealing L1-L2 compression fractures  Patient subsequently had a bone scan to evaluate these compression fractures on 09/26/2015.  This showed mild increased activity within the superior endplates of L3, L4 and L5 likely representing subacute fractures. Increased activity within the T2 vertebral body compatible with known compression fracture. No evidence of suggest bony metastatic disease.  As a part of her workup she also had an SPEP done which shows 3 monoclonal protein bands for a total M protein of 2.3 g/dL. On IFE this is noted to be monoclonal IgA kappa protein. Her sedimentation rate was also elevated at 73.  She was referred to [place] for further evaluation to rule out multiple myeloma. Patient notes her mid and lower back pain related to her compression fractures. Also notes some left hip pain. Her labs from January 2017 did not show any anemia, hypercalcemia or renal failure.  She notes that she did have a colonoscopy in 2015 which demonstrated a 4.5 cm rectal polyp which was  removed piecemeal. Pathology showed a tubulovillous adenoma. Patient was also noted to have significant diverticulosis and was not recommended routine colonoscopies. Given her CT scan showing concern of rectal mass she was referred back to Dr. Henrene Pastor at [place] for consideration of a sigmoidoscopy/colonoscopy. Patient notes no overt rectal bleeding. No obstructive symptoms. No change in bowel habits. No new abdominal pain. She also needs to connect with gi for management of her liver cirrhosis and related issues.   INTERVAL HISTORY:  Cassidy Barrett is here for her scheduled followup for recently diagnosed myeloma. The patient's last visit with [place] was on 04/15/18. She is accompanied today by her partner. The pt reports that she is doing well overall.   The pt reports that she has increased her food intake and has gained a pound in the short interim. She notes that she has held her calcitonin spray and is awaiting her first [place] shot today. She is taking Acyclovir and Vitamin D replacement as well. She notes that she is tolerating her Velcade and Dexamethasone very well.   Lab results today (04/22/18) of CBC w/diff, CMP is as follows: all values are WNL except for RBC at 3.18, MCV at 111.5, MCH at 38.1, RDW at 19.1, Monocytes abs at 1.5k, Sodium at 131, Chloride at 97, Glucose at 100, Calcium at 8.7, Albumin at 2.2, AST at 58, Alk Phos at 251.  On review of systems, pt reports eating better, weight gain, stable back pain, and denies nausea, vomiting, diarrhea, and any other symptoms.   MEDICAL HISTORY:  Past Medical History:  Diagnosis Date  . Alcohol abuse   . Arthritis   .  Ascites   . Atherosclerosis of aorta ([place])   . Cholelithiasis   . Cirrhosis ([place])   . Complication of anesthesia    Difficult time waking up after having biospy with colonoscopy  . 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 Compression fracture of lumbar vertebra ([facility])     L1 and L2  . Emphysema  of lung ([place])   . History of multiple myeloma    smoldering  . Hyponatremia   . Macrocytic anemia   . 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 Osteoporosis   . Pneumonia 04/2014  . Tobacco abuse   . Tubular adenoma   . Vertebral compression fracture ([facility])    ?? 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  SURGICAL HISTORY: Past Surgical History:  Procedure Laterality Date  . COLONOSCOPY N/A 05/24/2014   Procedure: COLONOSCOPY;  Surgeon: Gatha Mayer, MD;  Location: [facility];  Service: Endoscopy;  Laterality: N/A;  MAC if possible ultraslim colonoscope and gastroscope needed  . FLEXIBLE SIGMOIDOSCOPY N/A 05/21/2014   Procedure: FLEXIBLE SIGMOIDOSCOPY;  Surgeon: Gatha Mayer, MD;  Location: [facility];  Service: Endoscopy;  Laterality: N/A;  . KYPHOPLASTY N/A 12/12/2014   Procedure: KYPHOPLASTY;  Surgeon: Phylliss Bob, MD;  Location: [place];  Service: Orthopedics;  Laterality: N/A;  T10 kyphoplasty  . MANDIBLE FRACTURE SURGERY     X 2  . TONSILLECTOMY      SOCIAL HISTORY: Social History   Socioeconomic History  . Marital status: Married    Spouse name: Not on file  . Number of children: Not on file  . Years of education: Not on file  . Highest education level: Not on file  Occupational History  . Occupation: retired -[employer]  Social Needs  . Financial resource strain: Not on file  . Food insecurity:    Worry: Not on file    Inability: Not on file  . Transportation needs:    Medical: Not on file    Non-medical: Not on file  Tobacco Use  . Smoking status: Former Smoker    Packs/day: 0.25    Years: 46.00    Pack years: 11.50    Types: Cigarettes    Last attempt to quit: 02/24/2017    Years since quitting: 1.1  . Smokeless tobacco: Never Used  Substance and Sexual Activity  . Alcohol use: Yes    Alcohol/week: 0.0 standard drinks    Comment: 1-4 alcoholic beverages daily  . Drug use: No  . Sexual activity: Not on file  Lifestyle   . Physical activity:    Days per week: Not on file    Minutes per session: Not on file  . Stress: Not on file  Relationships  . Social connections:    Talks on phone: Not on file    Gets together: Not on file    Attends religious service: Not on file    Active member of club or organization: Not on file    Attends meetings of clubs or organizations: Not on file    Relationship status: Not on file  . Intimate partner violence:    Fear of current or ex partner: Not on file    Emotionally abused: Not on file    Physically abused: Not on file    Forced sexual activity: Not on file  Other Topics Concern  . Not on file  Social History Narrative  . Not on file   Patient notes that she is drinking about  1 beer and 1 hard liquor every day - was counseled regarding absolute alcohol abstinence.  FAMILY HISTORY:  No family history of colon cancer.  ALLERGIES:  is allergic to bee venom; benadryl [diphenhydramine]; hydrocodone-acetaminophen; oxycodone; tape; and valium [diazepam].  MEDICATIONS:  Current Outpatient Medications  Medication Sig Dispense Refill  . acyclovir (ZOVIRAX) 400 MG tablet Take 1 tablet (400 mg total) by mouth 2 (two) times daily. 60 tablet 3  . albuterol (PROVENTIL HFA;VENTOLIN HFA) 108 (90 Base) MCG/ACT inhaler INHALE 2 PUFFS INTO THE LUNGS EVERY 6 HOURS AS NEEDED FOR WHEEZING OR SHORTNESS OF BREATH 18 g 1  . ANORO ELLIPTA 62.5-25 MCG/INH AEPB INHALE 1 PUFF INTO THE LUNGS DAILY 60 each 11  . ergocalciferol (VITAMIN D2) 50000 units capsule Take 1 capsule (50,000 Units total) by mouth once a week. 12 capsule 2  . folic acid (FOLVITE) 1 MG tablet Take 1 tablet (1 mg total) by mouth daily. 30 tablet 3  . furosemide (LASIX) 20 MG tablet Take 0.5 tablets (10 mg total) by mouth daily. 30 tablet 1  . lidocaine (LIDODERM) 5 % Place 1-2 patches onto the skin daily as needed (severe pain). Remove & Discard patch within 12 hours or as directed by MD    . magnesium oxide  (MAG-OX) 400 (241.3 Mg) MG tablet Take 1 tablet (400 mg total) by mouth daily. 30 tablet 2  . OVER THE COUNTER MEDICATION Apply 400 mg topically daily as needed (arthritis/back pain). CBD cream    . potassium chloride SA (K-DUR,KLOR-CON) 20 MEQ tablet Take 1 tablet (20 mEq total) by mouth 2 (two) times daily. 180 tablet 0  . spironolactone (ALDACTONE) 50 MG tablet Take 1 tablet (50 mg total) by mouth daily. 30 tablet 2  . Menthol, Topical Analgesic, (BIOFREEZE EX) Apply topically as directed.    . ondansetron (ZOFRAN) 8 MG tablet Take 1 tablet (8 mg total) by mouth 2 (two) times daily as needed (Nausea or vomiting). (Patient not taking: Reported on 04/22/2018) 30 tablet 1  . pantoprazole (PROTONIX) 40 MG tablet Take 1 tablet (40 mg total) by mouth daily. (Patient not taking: Reported on 04/22/2018) 90 tablet 1  . potassium chloride (KLOR-CON) 20 MEQ packet Take 40 mEq by mouth 2 (two) times daily. (Patient not taking: Reported on 04/22/2018) 180 tablet 0   No current facility-administered medications for this visit.     REVIEW OF SYSTEMS:    A 10+ POINT REVIEW OF SYSTEMS WAS OBTAINED including neurology, dermatology, psychiatry, cardiac, respiratory, lymph, extremities, GI, GU, Musculoskeletal, constitutional, breasts, reproductive, HEENT.  All pertinent positives are noted in the HPI.  All others are negative.   PHYSICAL EXAMINATION: ECOG PERFORMANCE STATUS: 2 - Symptomatic, <50% confined to bed  Vitals:   04/22/18 1030  BP: 109/69  Pulse: (!) 117  Resp: 18  Temp: 97.6 F (36.4 C)  SpO2: 91%   Filed Weights   04/22/18 1030  Weight: 73 lb 8 oz (33.3 kg)   .Body mass index is 14.85 kg/m.   GENERAL:alert, in no acute distress and comfortable SKIN: no acute rashes, no significant lesions EYES: conjunctiva are pink and non-injected, sclera anicteric OROPHARYNX: MMM, no exudates, no oropharyngeal erythema or ulceration NECK: supple, no JVD LYMPH:  no palpable lymphadenopathy in the  cervical, axillary or inguinal regions LUNGS: clear to auscultation b/l with normal respiratory effort HEART: regular rate & rhythm ABDOMEN:  normoactive bowel sounds , non tender, not distended. No palpable hepatosplenomegaly.  Extremity: no pedal edema PSYCH: alert &  oriented x 3 with fluent speech NEURO: no focal motor/sensory deficits   LABORATORY DATA:  I have reviewed the data as listed  . CBC Latest Ref Rng & Units 04/22/2018 04/15/2018 04/08/2018  WBC 3.9 - 10.3 K/uL 6.9 5.7 5.8  Hemoglobin 11.6 - 15.9 g/dL 12.1 10.6(L) 10.0(L)  Hematocrit 34.8 - 46.6 % 35.5 31.3(L) 29.0(L)  Platelets 145 - 400 K/uL 174 193 211    . CMP Latest Ref Rng & Units 04/22/2018 04/15/2018 04/08/2018  Glucose 70 - 99 mg/dL 100(H) 99 124(H)  BUN 8 - 23 mg/dL _0 Creatinine 0.44 - 1.00 mg/dL 0.58 0.57 0.64  Sodium 135 - 145 mmol/L 131(L) 129(L) 128(L)  Potassium 3.5 - 5.1 mmol/L 4.9 4.5 4.3  Chloride 98 - 111 mmol/L 97(L) 97(L) 94(L)  CO2 22 - 32 mmol/L _1 Calcium 8.9 - 10.3 mg/dL 8.7(L) 8.0(L) 8.9  Total Protein 6.5 - 8.1 g/dL 7.3 8.2(H) 8.7(H)  Total Bilirubin 0.3 - 1.2 mg/dL 1.2 1.2 1.6(H)  Alkaline Phos 38 - 126 U/L 251(H) 259(H) 299(H)  AST 15 - 41 U/L 58(H) 54(H) 69(H)  ALT 0 - 44 U/L _2 Component     Latest Ref Rng & Units 02/18/2018 03/02/2018  TIBC     250 - 450 ug/dL  127 (L)  UIBC     118 - 369 ug/dL  18 (L)  Iron     27 - 139 ug/dL  109  Iron Saturation     15 - 55 %  86 (HH)  Hepatitis C Ab     NON-REACTI  NON-REACTIVE  SIGNAL TO CUT-OFF     <1.00  0.02  AFP Tumor Marker     ng/mL 2.9   Hepatitis A AB,Total     NON-REACTI  NON-REACTIVE  Hep B S Ab     NON-REACTI  NON-REACTIVE  Hepatitis B Surface Ag     NON-REACTI  NON-REACTIVE   . Lab Results  Component Value Date   IRON 109 03/02/2018   TIBC 127 (L) 03/02/2018   IRONPCTSAT 86 (HH) 03/02/2018   (Iron and TIBC)  Lab Results  Component Value Date   FERRITIN >1500.0 (H) 02/18/2018     10/16/15 BM Bx:   03/21/18 BM Bx:    RADIOGRAPHIC STUDIES: I have personally reviewed the radiological images as listed and agreed with the findings in the report. Nm Pet Image Initial (pi) Skull Base To Thigh  Result Date: 03/25/2018 CLINICAL DATA: INITIAL: CLINICAL DATA: INITIAL Initial treatment strategy for multiple myeloma. EXAM: NUCLEAR MEDICINE PET SKULL BASE TO THIGH TECHNIQUE: 5.3 mCi F-18 FDG was injected intravenously. Full-ring PET imaging was performed from the skull base to thigh after the radiotracer. CT data was obtained and used for attenuation correction and anatomic localization. Fasting blood glucose: 115 mg/dl COMPARISON:  None. FINDINGS: Mediastinal blood pool activity: SUV max 1.6 NECK: No hypermetabolic lymph nodes in the neck. Incidental CT findings: none CHEST: No hypermetabolic mediastinal or hilar nodes. No suspicious pulmonary nodules on the CT scan. Incidental CT findings: Severe upper lobe emphysema. ABDOMEN/PELVIS: No abnormal hypermetabolic activity within the liver, pancreas, adrenal glands, or spleen. No hypermetabolic lymph nodes in the abdomen or pelvis. Incidental CT findings: Nephrocalcinosis noted. Atherosclerotic calcification of the aorta. SKELETON: No focal metabolic activity within the skeleton to localize active multiple myeloma. No plasmacytoma identified. Incidental CT findings: Kyphoplasty noted in the T12 vertebral body with focal kyphosis. IMPRESSION: 1. No evidence  of active multiple myeloma within the skeleton. 2. No soft tissue mass to suggest plasmacytoma. 3. Aortic Atherosclerosis (ICD10-I70.0) and Emphysema (ICD10-J43.9). Electronically Signed   By: Suzy Bouchard M.D.   On: 03/25/2018 11:34     ASSESSMENT & PLAN:   72 y.o. Caucasian female with severe osteoporosis and vertebral compression fractures  #1 IgA kappa Multiple myeloma    M protein level increased significantly to 3 ( 1.5-->1.8 ---> 1.9-->1.8-->1.7--> 3-->3.4-->3.7 Noted  to have new anemia with a hgb of 10 Labs with no signs of  hypercalcemia or renal failure. No overt focal new bone pains. Bm Bx shows 21% abnormal plasma cells. Rpt Bone Survey on 05/12/2016 -- showed no evidence of lytic lesions.  02/28/18 Bone Survey which revealed New T7 compression deformity. Multiple chronic compression deformities are noted. No definitive lytic lesions are identified.   03/25/18 PET/CT revealed No evidence of active multiple myeloma within the skeleton. 2. No soft tissue mass to suggest plasmacytoma. 3. Aortic Atherosclerosis and Emphysema. 03/21/18 BM Bx revealed 30% atypical plasma cells   PLAN: -Will hold Calcitonin spray -Discussed that triple drug therapy might be difficult to tolerate with concern for her poor liver functions -Continue taking Vitamin D, and Vitamin B complex -Continue follow up with Dr. Carlean Purl in GI  -Continue Acyclovir  -Will consider [place] further into treatment to ensure tolerance to treatment  -Discussed pt labwork today, 04/22/18; anemia resolved with HGB at 12.1, albumin improved to 2.2 -Continue with Xgeva shot today and every 4 weeks -The pt has no prohibitive toxicities from continuing dose reduced Velcade (75m/m2) and 256mDexamethasone at this time.  -will consider increasing velcade dose to 1.30m35m2 if treatment tolerated considering improved bilirubin levels -Continue optimizing food intake and calcium rich foods -Will see the pt back in one month    #2 liver cirrhosis with known varices. Increased bilirubin levels. Plan -Counseled on absolute alcohol cessation. -continue f/u with PCP  #3 osteoporosis with vertebral compression fractures.  Plan  -continue aggressive vitamin D replacement . -Pt has had full dental extraction and has recovered well with dental clearance to begin Xgeva.  -continue Xgeva q4weeks   -No treatment next week -Patient wants to cancel appointment from 10/9  And change to 10/11 and then continue  weekly treatments with labs - please schedule next 6 treatments -continue XgeMarchelle FolksTC with Dr KalIrene Limboth labs in 4 weeks    All of the patients and her female partners questions were answered to their apparent satisfaction. The patient knows to call the clinic with any problems, questions or concerns.  The total time spent in the appt was 20 minutes and more than 50% was on counseling and direct patient cares.      GauSullivan Lone MS AAHIVMS SCHMount Nittany Medical CenterHMilan General Hospitalmatology/Oncology Physician ConHeart Of Florida Surgery CenterOffice):       [phone] cell):  [phone]):           [phone], SchBaldwin Jamaicam acting as a scribe for Dr. KalIrene LimboI have reviewed the above documentation for accuracy and completeness, and I agree with the above. .GaBrunetta Genera

## 2018-04-22 NOTE — Progress Notes (Signed)
Per Dr. Irene Limbo, ok to treat with HR 122.

## 2018-04-22 NOTE — Patient Instructions (Addendum)
[facility] Discharge Instructions for Patients Receiving Chemotherapy  Today you received the following chemotherapy agents Velcade  To help prevent nausea and vomiting after your treatment, we encourage you to take your nausea medication.   If you develop nausea and vomiting that is not controlled by your nausea medication, call the clinic.   BELOW ARE SYMPTOMS THAT SHOULD BE REPORTED IMMEDIATELY:  *FEVER GREATER THAN 100.5 F  *CHILLS WITH OR WITHOUT FEVER  NAUSEA AND VOMITING THAT IS NOT CONTROLLED WITH YOUR NAUSEA MEDICATION  *UNUSUAL SHORTNESS OF BREATH  *UNUSUAL BRUISING OR BLEEDING  TENDERNESS IN MOUTH AND THROAT WITH OR WITHOUT PRESENCE OF ULCERS  *URINARY PROBLEMS  *BOWEL PROBLEMS  UNUSUAL RASH Items with * indicate a potential emergency and should be followed up as soon as possible.  Feel free to call the clinic should you have any questions or concerns. The clinic phone number is [phone].  Please show the CHEMO ALERT CARD at check-in to the Emergency Department and triage nurse.    

## 2018-05-04 ENCOUNTER — Other Ambulatory Visit: Payer: Self-pay

## 2018-05-04 ENCOUNTER — Ambulatory Visit: Payer: Self-pay

## 2018-05-04 ENCOUNTER — Ambulatory Visit: Payer: Self-pay | Admitting: Hematology

## 2018-05-06 ENCOUNTER — Inpatient Hospital Stay: Payer: Medicare Other | Attending: Hematology

## 2018-05-06 ENCOUNTER — Inpatient Hospital Stay: Payer: Medicare Other

## 2018-05-06 VITALS — BP 94/83 | HR 110 | Temp 97.7°F | Resp 18

## 2018-05-06 DIAGNOSIS — M1990 Unspecified osteoarthritis, unspecified site: Secondary | ICD-10-CM | POA: Diagnosis not present

## 2018-05-06 DIAGNOSIS — X58XXXS Exposure to other specified factors, sequela: Secondary | ICD-10-CM | POA: Diagnosis not present

## 2018-05-06 DIAGNOSIS — Z23 Encounter for immunization: Secondary | ICD-10-CM | POA: Diagnosis not present

## 2018-05-06 DIAGNOSIS — J439 Emphysema, unspecified: Secondary | ICD-10-CM | POA: Diagnosis not present

## 2018-05-06 DIAGNOSIS — M545 Low back pain: Secondary | ICD-10-CM | POA: Diagnosis not present

## 2018-05-06 DIAGNOSIS — Z79899 Other long term (current) drug therapy: Secondary | ICD-10-CM | POA: Insufficient documentation

## 2018-05-06 DIAGNOSIS — M8008XS Age-related osteoporosis with current pathological fracture, vertebra(e), sequela: Secondary | ICD-10-CM | POA: Diagnosis not present

## 2018-05-06 DIAGNOSIS — K7031 Alcoholic cirrhosis of liver with ascites: Secondary | ICD-10-CM | POA: Insufficient documentation

## 2018-05-06 DIAGNOSIS — Z5111 Encounter for antineoplastic chemotherapy: Secondary | ICD-10-CM | POA: Diagnosis not present

## 2018-05-06 DIAGNOSIS — Z87891 Personal history of nicotine dependence: Secondary | ICD-10-CM | POA: Insufficient documentation

## 2018-05-06 DIAGNOSIS — C9000 Multiple myeloma not having achieved remission: Secondary | ICD-10-CM | POA: Diagnosis not present

## 2018-05-06 DIAGNOSIS — R748 Abnormal levels of other serum enzymes: Secondary | ICD-10-CM | POA: Diagnosis not present

## 2018-05-06 DIAGNOSIS — M546 Pain in thoracic spine: Secondary | ICD-10-CM | POA: Diagnosis not present

## 2018-05-06 DIAGNOSIS — I700 Atherosclerosis of aorta: Secondary | ICD-10-CM | POA: Diagnosis not present

## 2018-05-06 DIAGNOSIS — D472 Monoclonal gammopathy: Secondary | ICD-10-CM

## 2018-05-06 DIAGNOSIS — M25552 Pain in left hip: Secondary | ICD-10-CM | POA: Diagnosis not present

## 2018-05-06 DIAGNOSIS — Z7189 Other specified counseling: Secondary | ICD-10-CM

## 2018-05-06 LAB — CBC WITH DIFFERENTIAL/PLATELET
ABS IMMATURE GRANULOCYTES: 0.02 10*3/uL (ref 0.00–0.07)
BASOS PCT: 1 %
Basophils Absolute: 0.1 10*3/uL (ref 0.0–0.1)
Eosinophils Absolute: 0.1 10*3/uL (ref 0.0–0.5)
Eosinophils Relative: 2 %
HEMATOCRIT: 36.7 % (ref 36.0–46.0)
HEMOGLOBIN: 13 g/dL (ref 12.0–15.0)
Immature Granulocytes: 0 %
LYMPHS ABS: 0.9 10*3/uL (ref 0.7–4.0)
LYMPHS PCT: 16 %
MCH: 37.4 pg — AB (ref 26.0–34.0)
MCHC: 35.4 g/dL (ref 30.0–36.0)
MCV: 105.5 fL — AB (ref 80.0–100.0)
MONO ABS: 0.8 10*3/uL (ref 0.1–1.0)
MONOS PCT: 14 %
NEUTROS ABS: 3.8 10*3/uL (ref 1.7–7.7)
Neutrophils Relative %: 67 %
Platelets: 327 10*3/uL (ref 150–400)
RBC: 3.48 MIL/uL — ABNORMAL LOW (ref 3.87–5.11)
RDW: 14.7 % (ref 11.5–15.5)
WBC: 5.7 10*3/uL (ref 4.0–10.5)
nRBC: 0 % (ref 0.0–0.2)

## 2018-05-06 LAB — CMP (CANCER CENTER ONLY)
ALT: 19 U/L (ref 0–44)
AST: 49 U/L — ABNORMAL HIGH (ref 15–41)
Albumin: 2.8 g/dL — ABNORMAL LOW (ref 3.5–5.0)
Alkaline Phosphatase: 259 U/L — ABNORMAL HIGH (ref 38–126)
Anion gap: 8 (ref 5–15)
BILIRUBIN TOTAL: 1.1 mg/dL (ref 0.3–1.2)
BUN: 5 mg/dL — AB (ref 8–23)
CALCIUM: 8.9 mg/dL (ref 8.9–10.3)
CO2: 26 mmol/L (ref 22–32)
CREATININE: 0.59 mg/dL (ref 0.44–1.00)
Chloride: 99 mmol/L (ref 98–111)
Glucose, Bld: 108 mg/dL — ABNORMAL HIGH (ref 70–99)
Potassium: 4.7 mmol/L (ref 3.5–5.1)
Sodium: 133 mmol/L — ABNORMAL LOW (ref 135–145)
Total Protein: 6.3 g/dL — ABNORMAL LOW (ref 6.5–8.1)

## 2018-05-06 LAB — IRON AND TIBC
IRON: 136 ug/dL (ref 41–142)
Saturation Ratios: 50 % (ref 21–57)
TIBC: 274 ug/dL (ref 236–444)
UIBC: 138 ug/dL

## 2018-05-06 LAB — FERRITIN: FERRITIN: 1066 ng/mL — AB (ref 11–307)

## 2018-05-06 MED ORDER — DENOSUMAB 120 MG/1.7ML ~~LOC~~ SOLN
120.0000 mg | Freq: Once | SUBCUTANEOUS | Status: AC
  Administered 2018-05-06: 120 mg via SUBCUTANEOUS

## 2018-05-06 MED ORDER — PROCHLORPERAZINE MALEATE 10 MG PO TABS
10.0000 mg | ORAL_TABLET | Freq: Once | ORAL | Status: AC
  Administered 2018-05-06: 10 mg via ORAL

## 2018-05-06 MED ORDER — PROCHLORPERAZINE MALEATE 10 MG PO TABS
ORAL_TABLET | ORAL | Status: AC
  Filled 2018-05-06: qty 1

## 2018-05-06 MED ORDER — DEXAMETHASONE 4 MG PO TABS
20.0000 mg | ORAL_TABLET | Freq: Once | ORAL | Status: AC
  Administered 2018-05-06: 20 mg via ORAL

## 2018-05-06 MED ORDER — BORTEZOMIB CHEMO SQ INJECTION 3.5 MG (2.5MG/ML)
1.0000 mg/m2 | Freq: Once | INTRAMUSCULAR | Status: AC
  Administered 2018-05-06: 1.25 mg via SUBCUTANEOUS
  Filled 2018-05-06: qty 0.5

## 2018-05-06 MED ORDER — DEXAMETHASONE 4 MG PO TABS
ORAL_TABLET | ORAL | Status: AC
  Filled 2018-05-06: qty 5

## 2018-05-06 MED ORDER — DENOSUMAB 120 MG/1.7ML ~~LOC~~ SOLN
SUBCUTANEOUS | Status: AC
  Filled 2018-05-06: qty 1.7

## 2018-05-06 NOTE — Patient Instructions (Addendum)
[facility] Discharge Instructions for Patients Receiving Chemotherapy  Today you received the following chemotherapy agents Velcade  To help prevent nausea and vomiting after your treatment, we encourage you to take your nausea medication.   If you develop nausea and vomiting that is not controlled by your nausea medication, call the clinic.   BELOW ARE SYMPTOMS THAT SHOULD BE REPORTED IMMEDIATELY:  *FEVER GREATER THAN 100.5 F  *CHILLS WITH OR WITHOUT FEVER  NAUSEA AND VOMITING THAT IS NOT CONTROLLED WITH YOUR NAUSEA MEDICATION  *UNUSUAL SHORTNESS OF BREATH  *UNUSUAL BRUISING OR BLEEDING  TENDERNESS IN MOUTH AND THROAT WITH OR WITHOUT PRESENCE OF ULCERS  *URINARY PROBLEMS  *BOWEL PROBLEMS  UNUSUAL RASH Items with * indicate a potential emergency and should be followed up as soon as possible.  Feel free to call the clinic should you have any questions or concerns. The clinic phone number is [phone].  Please show the [place] at check-in to the Emergency Department and triage nurse.   Denosumab injection What is this medicine? DENOSUMAB (den oh sue mab) slows bone breakdown. Prolia is used to treat osteoporosis in women after menopause and in men. Delton See is used to treat a high calcium level due to cancer and to prevent bone fractures and other bone problems caused by multiple myeloma or cancer bone metastases. Delton See is also used to treat giant cell tumor of the bone. This medicine may be used for other purposes; ask your health care provider or pharmacist if you have questions. COMMON BRAND NAME(S): Prolia, XGEVA What should I tell my health care provider before I take this medicine? They need to know if you have any of these conditions: -dental disease -having surgery or tooth extraction -infection -kidney disease -low levels of calcium or Vitamin D in the blood -malnutrition -on hemodialysis -skin conditions or sensitivity -thyroid or  parathyroid disease -an unusual reaction to denosumab, other medicines, foods, dyes, or preservatives -pregnant or trying to get pregnant -breast-feeding How should I use this medicine? This medicine is for injection under the skin. It is given by a health care professional in a hospital or clinic setting. If you are getting Prolia, a special MedGuide will be given to you by the pharmacist with each prescription and refill. Be sure to read this information carefully each time. For Prolia, talk to your pediatrician regarding the use of this medicine in children. Special care may be needed. For Delton See, talk to your pediatrician regarding the use of this medicine in children. While this drug may be prescribed for children as young as 13 years for selected conditions, precautions do apply. Overdosage: If you think you have taken too much of this medicine contact a poison control center or emergency room at once. NOTE: This medicine is only for you. Do not share this medicine with others. What if I miss a dose? It is important not to miss your dose. Call your doctor or health care professional if you are unable to keep an appointment. What may interact with this medicine? Do not take this medicine with any of the following medications: -other medicines containing denosumab This medicine may also interact with the following medications: -medicines that lower your chance of fighting infection -steroid medicines like prednisone or cortisone This list may not describe all possible interactions. Give your health care provider a list of all the medicines, herbs, non-prescription drugs, or dietary supplements you use. Also tell them if you smoke, drink alcohol, or use illegal drugs.  Some items may interact with your medicine. What should I watch for while using this medicine? Visit your doctor or health care professional for regular checks on your progress. Your doctor or health care professional may order  blood tests and other tests to see how you are doing. Call your doctor or health care professional for advice if you get a fever, chills or sore throat, or other symptoms of a cold or flu. Do not treat yourself. This drug may decrease your body's ability to fight infection. Try to avoid being around people who are sick. You should make sure you get enough calcium and vitamin D while you are taking this medicine, unless your doctor tells you not to. Discuss the foods you eat and the vitamins you take with your health care professional. See your dentist regularly. Brush and floss your teeth as directed. Before you have any dental work done, tell your dentist you are receiving this medicine. Do not become pregnant while taking this medicine or for 5 months after stopping it. Talk with your doctor or health care professional about your birth control options while taking this medicine. Women should inform their doctor if they wish to become pregnant or think they might be pregnant. There is a potential for serious side effects to an unborn child. Talk to your health care professional or pharmacist for more information. What side effects may I notice from receiving this medicine? Side effects that you should report to your doctor or health care professional as soon as possible: -allergic reactions like skin rash, itching or hives, swelling of the face, lips, or tongue -bone pain -breathing problems -dizziness -jaw pain, especially after dental work -redness, blistering, peeling of the skin -signs and symptoms of infection like fever or chills; cough; sore throat; pain or trouble passing urine -signs of low calcium like fast heartbeat, muscle cramps or muscle pain; pain, tingling, numbness in the hands or feet; seizures -unusual bleeding or bruising -unusually weak or tired Side effects that usually do not require medical attention (report to your doctor or health care professional if they continue or are  bothersome): -constipation -diarrhea -headache -joint pain -loss of appetite -muscle pain -runny nose -tiredness -upset stomach This list may not describe all possible side effects. Call your doctor for medical advice about side effects. You may report side effects to FDA at 1-800-FDA-1088. Where should I keep my medicine? This medicine is only given in a clinic, doctor's office, or other health care setting and will not be stored at home. NOTE: This sheet is a summary. It may not cover all possible information. If you have questions about this medicine, talk to your doctor, pharmacist, or health care provider.  2018 Elsevier/Gold Standard (2016-08-04 19:17:21)

## 2018-05-09 LAB — KAPPA/LAMBDA LIGHT CHAINS
KAPPA, LAMDA LIGHT CHAIN RATIO: 1.09 (ref 0.26–1.65)
Kappa free light chain: 13 mg/L (ref 3.3–19.4)
LAMDA FREE LIGHT CHAINS: 11.9 mg/L (ref 5.7–26.3)

## 2018-05-10 LAB — MULTIPLE MYELOMA PANEL, SERUM
ALPHA2 GLOB SERPL ELPH-MCNC: 0.6 g/dL (ref 0.4–1.0)
Albumin SerPl Elph-Mcnc: 3.1 g/dL (ref 2.9–4.4)
Albumin/Glob SerPl: 1.1 (ref 0.7–1.7)
Alpha 1: 0.3 g/dL (ref 0.0–0.4)
B-Globulin SerPl Elph-Mcnc: 1.4 g/dL — ABNORMAL HIGH (ref 0.7–1.3)
Gamma Glob SerPl Elph-Mcnc: 0.8 g/dL (ref 0.4–1.8)
Globulin, Total: 3.1 g/dL (ref 2.2–3.9)
IGA: 1207 mg/dL — AB (ref 87–352)
IGG (IMMUNOGLOBIN G), SERUM: 867 mg/dL (ref 700–1600)
IGM (IMMUNOGLOBULIN M), SRM: 29 mg/dL (ref 26–217)
M Protein SerPl Elph-Mcnc: 0.7 g/dL — ABNORMAL HIGH
TOTAL PROTEIN ELP: 6.2 g/dL (ref 6.0–8.5)

## 2018-05-11 ENCOUNTER — Telehealth: Payer: Self-pay | Admitting: *Deleted

## 2018-05-11 ENCOUNTER — Other Ambulatory Visit: Payer: Self-pay | Admitting: *Deleted

## 2018-05-11 DIAGNOSIS — Z23 Encounter for immunization: Secondary | ICD-10-CM

## 2018-05-11 NOTE — Telephone Encounter (Signed)
Patient contacted office to ask if flu and pneumonia vaccines could be given Friday during her velcade appointment. Dr. Irene Limbo authorized both injections. Flu shot added to Friday's appointment. Per pharmacy, since last year's pneumonia vaccine was given 06/25/17, insurance may not cover it as it is recommended to have them yearly and this is early. Contacted patient, gave above information. Patient verbalized understanding of explanation and states she will wait until December for pneumonia vaccine.

## 2018-05-13 ENCOUNTER — Encounter: Payer: Self-pay | Admitting: Hematology

## 2018-05-13 ENCOUNTER — Inpatient Hospital Stay: Payer: Medicare Other

## 2018-05-13 VITALS — BP 100/69 | HR 127 | Temp 98.0°F | Resp 18 | Ht 59.0 in | Wt 73.5 lb

## 2018-05-13 DIAGNOSIS — I700 Atherosclerosis of aorta: Secondary | ICD-10-CM | POA: Diagnosis not present

## 2018-05-13 DIAGNOSIS — C9000 Multiple myeloma not having achieved remission: Secondary | ICD-10-CM

## 2018-05-13 DIAGNOSIS — Z5111 Encounter for antineoplastic chemotherapy: Secondary | ICD-10-CM | POA: Diagnosis not present

## 2018-05-13 DIAGNOSIS — Z23 Encounter for immunization: Secondary | ICD-10-CM | POA: Diagnosis not present

## 2018-05-13 DIAGNOSIS — Z7189 Other specified counseling: Secondary | ICD-10-CM

## 2018-05-13 DIAGNOSIS — J439 Emphysema, unspecified: Secondary | ICD-10-CM | POA: Diagnosis not present

## 2018-05-13 DIAGNOSIS — K7031 Alcoholic cirrhosis of liver with ascites: Secondary | ICD-10-CM | POA: Diagnosis not present

## 2018-05-13 LAB — CBC WITH DIFFERENTIAL/PLATELET
Abs Immature Granulocytes: 0.03 10*3/uL (ref 0.00–0.07)
BASOS PCT: 0 %
Basophils Absolute: 0 10*3/uL (ref 0.0–0.1)
EOS ABS: 0.1 10*3/uL (ref 0.0–0.5)
EOS PCT: 2 %
HEMATOCRIT: 42 % (ref 36.0–46.0)
Hemoglobin: 14.8 g/dL (ref 12.0–15.0)
IMMATURE GRANULOCYTES: 1 %
LYMPHS ABS: 1 10*3/uL (ref 0.7–4.0)
Lymphocytes Relative: 17 %
MCH: 37 pg — AB (ref 26.0–34.0)
MCHC: 35.2 g/dL (ref 30.0–36.0)
MCV: 105 fL — AB (ref 80.0–100.0)
MONO ABS: 1 10*3/uL (ref 0.1–1.0)
MONOS PCT: 18 %
NEUTROS PCT: 62 %
Neutro Abs: 3.6 10*3/uL (ref 1.7–7.7)
PLATELETS: 164 10*3/uL (ref 150–400)
RBC: 4 MIL/uL (ref 3.87–5.11)
RDW: 13.9 % (ref 11.5–15.5)
WBC: 5.8 10*3/uL (ref 4.0–10.5)
nRBC: 0 % (ref 0.0–0.2)

## 2018-05-13 LAB — CMP (CANCER CENTER ONLY)
ALT: 21 U/L (ref 0–44)
AST: 51 U/L — AB (ref 15–41)
Albumin: 3.1 g/dL — ABNORMAL LOW (ref 3.5–5.0)
Alkaline Phosphatase: 287 U/L — ABNORMAL HIGH (ref 38–126)
Anion gap: 10 (ref 5–15)
BILIRUBIN TOTAL: 1.3 mg/dL — AB (ref 0.3–1.2)
BUN: 7 mg/dL — AB (ref 8–23)
CHLORIDE: 99 mmol/L (ref 98–111)
CO2: 26 mmol/L (ref 22–32)
Calcium: 9.5 mg/dL (ref 8.9–10.3)
Creatinine: 0.62 mg/dL (ref 0.44–1.00)
Glucose, Bld: 98 mg/dL (ref 70–99)
POTASSIUM: 4.8 mmol/L (ref 3.5–5.1)
Sodium: 135 mmol/L (ref 135–145)
TOTAL PROTEIN: 6.8 g/dL (ref 6.5–8.1)

## 2018-05-13 MED ORDER — DEXAMETHASONE 4 MG PO TABS
ORAL_TABLET | ORAL | Status: AC
  Filled 2018-05-13: qty 5

## 2018-05-13 MED ORDER — INFLUENZA VAC SPLIT QUAD 0.5 ML IM SUSY
0.5000 mL | PREFILLED_SYRINGE | Freq: Once | INTRAMUSCULAR | Status: AC
  Administered 2018-05-13: 0.5 mL via INTRAMUSCULAR

## 2018-05-13 MED ORDER — PROCHLORPERAZINE MALEATE 10 MG PO TABS
10.0000 mg | ORAL_TABLET | Freq: Once | ORAL | Status: AC
  Administered 2018-05-13: 10 mg via ORAL

## 2018-05-13 MED ORDER — BORTEZOMIB CHEMO SQ INJECTION 3.5 MG (2.5MG/ML)
1.0000 mg/m2 | Freq: Once | INTRAMUSCULAR | Status: AC
  Administered 2018-05-13: 1.25 mg via SUBCUTANEOUS
  Filled 2018-05-13: qty 0.5

## 2018-05-13 MED ORDER — DEXAMETHASONE 4 MG PO TABS
20.0000 mg | ORAL_TABLET | Freq: Once | ORAL | Status: AC
  Administered 2018-05-13: 20 mg via ORAL

## 2018-05-13 MED ORDER — INFLUENZA VAC SPLIT QUAD 0.5 ML IM SUSY
PREFILLED_SYRINGE | INTRAMUSCULAR | Status: AC
  Filled 2018-05-13: qty 0.5

## 2018-05-13 MED ORDER — PROCHLORPERAZINE MALEATE 10 MG PO TABS
ORAL_TABLET | ORAL | Status: AC
  Filled 2018-05-13: qty 1

## 2018-05-13 NOTE — Progress Notes (Signed)
Met with patient in infusion to introduce myself as Arboriculturist and to advise of available resources.  Patient has two insurances, therefore copay assistance should not be needed.  Discussed the one-time $500 [place]. Patient states she spoke with someone on yesterday and her household is over the income. Left my card for any additional financial questions or concerns.

## 2018-05-13 NOTE — Patient Instructions (Signed)
[facility] Discharge Instructions for Patients Receiving Chemotherapy  Today you received the following chemotherapy agents bortezomib (Velcade)  To help prevent nausea and vomiting after your treatment, we encourage you to take your nausea medication.   If you develop nausea and vomiting that is not controlled by your nausea medication, call the clinic.   BELOW ARE SYMPTOMS THAT SHOULD BE REPORTED IMMEDIATELY:  *FEVER GREATER THAN 100.5 F  *CHILLS WITH OR WITHOUT FEVER  NAUSEA AND VOMITING THAT IS NOT CONTROLLED WITH YOUR NAUSEA MEDICATION  *UNUSUAL SHORTNESS OF BREATH  *UNUSUAL BRUISING OR BLEEDING  TENDERNESS IN MOUTH AND THROAT WITH OR WITHOUT PRESENCE OF ULCERS  *URINARY PROBLEMS  *BOWEL PROBLEMS  UNUSUAL RASH Items with * indicate a potential emergency and should be followed up as soon as possible.  Feel free to call the clinic should you have any questions or concerns. The clinic phone number is [phone].  Please show the [place] at check-in to the Emergency Department and triage nurse.  Influenza Virus Vaccine injection What is this medicine? INFLUENZA VIRUS VACCINE (in floo EN zuh VAHY ruhs vak SEEN) helps to reduce the risk of getting influenza also known as the flu. The vaccine only helps protect you against some strains of the flu. This medicine may be used for other purposes; ask your health care provider or pharmacist if you have questions. COMMON BRAND NAME(S): Afluria, Agriflu, Alfuria, FLUAD, Fluarix, Fluarix Quadrivalent, Flublok, Flublok Quadrivalent, FLUCELVAX, Flulaval, Fluvirin, Fluzone, Fluzone High-Dose, Fluzone Intradermal What should I tell my health care provider before I take this medicine? They need to know if you have any of these conditions: -bleeding disorder like hemophilia -fever or infection -Guillain-Barre syndrome or other neurological problems -immune system problems -infection with the human  immunodeficiency virus (HIV) or AIDS -low blood platelet counts -multiple sclerosis -an unusual or allergic reaction to influenza virus vaccine, latex, other medicines, foods, dyes, or preservatives. Different brands of vaccines contain different allergens. Some may contain latex or eggs. Talk to your doctor about your allergies to make sure that you get the right vaccine. -pregnant or trying to get pregnant -breast-feeding How should I use this medicine? This vaccine is for injection into a muscle or under the skin. It is given by a health care professional. A copy of Vaccine Information Statements will be given before each vaccination. Read this sheet carefully each time. The sheet may change frequently. Talk to your healthcare provider to see which vaccines are right for you. Some vaccines should not be used in all age groups. Overdosage: If you think you have taken too much of this medicine contact a poison control center or emergency room at once. NOTE: This medicine is only for you. Do not share this medicine with others. What if I miss a dose? This does not apply. What may interact with this medicine? -chemotherapy or radiation therapy -medicines that lower your immune system like etanercept, anakinra, infliximab, and adalimumab -medicines that treat or prevent blood clots like warfarin -phenytoin -steroid medicines like prednisone or cortisone -theophylline -vaccines This list may not describe all possible interactions. Give your health care provider a list of all the medicines, herbs, non-prescription drugs, or dietary supplements you use. Also tell them if you smoke, drink alcohol, or use illegal drugs. Some items may interact with your medicine. What should I watch for while using this medicine? Report any side effects that do not go away within 3 days to your doctor or health care professional.  Call your health care provider if any unusual symptoms occur within 6 weeks of  receiving this vaccine. You may still catch the flu, but the illness is not usually as bad. You cannot get the flu from the vaccine. The vaccine will not protect against colds or other illnesses that may cause fever. The vaccine is needed every year. What side effects may I notice from receiving this medicine? Side effects that you should report to your doctor or health care professional as soon as possible: -allergic reactions like skin rash, itching or hives, swelling of the face, lips, or tongue Side effects that usually do not require medical attention (report to your doctor or health care professional if they continue or are bothersome): -fever -headache -muscle aches and pains -pain, tenderness, redness, or swelling at the injection site -tiredness This list may not describe all possible side effects. Call your doctor for medical advice about side effects. You may report side effects to FDA at 1-800-FDA-1088. Where should I keep my medicine? The vaccine will be given by a health care professional in a clinic, pharmacy, doctor's office, or other health care setting. You will not be given vaccine doses to store at home. NOTE: This sheet is a summary. It may not cover all possible information. If you have questions about this medicine, talk to your doctor, pharmacist, or health care provider.  2018 Elsevier/Gold Standard (2015-02-01 10:07:28)

## 2018-05-16 LAB — MULTIPLE MYELOMA PANEL, SERUM
ALBUMIN SERPL ELPH-MCNC: 3.3 g/dL (ref 2.9–4.4)
ALPHA 1: 0.3 g/dL (ref 0.0–0.4)
ALPHA2 GLOB SERPL ELPH-MCNC: 0.7 g/dL (ref 0.4–1.0)
Albumin/Glob SerPl: 1.2 (ref 0.7–1.7)
B-GLOBULIN SERPL ELPH-MCNC: 1.2 g/dL (ref 0.7–1.3)
Gamma Glob SerPl Elph-Mcnc: 0.8 g/dL (ref 0.4–1.8)
Globulin, Total: 3 g/dL (ref 2.2–3.9)
IGG (IMMUNOGLOBIN G), SERUM: 955 mg/dL (ref 700–1600)
IgA: 797 mg/dL — ABNORMAL HIGH (ref 87–352)
IgM (Immunoglobulin M), Srm: 29 mg/dL (ref 26–217)
M PROTEIN SERPL ELPH-MCNC: 0.5 g/dL — AB
TOTAL PROTEIN ELP: 6.3 g/dL (ref 6.0–8.5)

## 2018-05-16 LAB — KAPPA/LAMBDA LIGHT CHAINS
Kappa free light chain: 12.2 mg/L (ref 3.3–19.4)
Kappa, lambda light chain ratio: 1.02 (ref 0.26–1.65)
LAMDA FREE LIGHT CHAINS: 12 mg/L (ref 5.7–26.3)

## 2018-05-19 ENCOUNTER — Telehealth: Payer: Self-pay

## 2018-05-19 NOTE — Telephone Encounter (Signed)
Pt called requesting visit times tomorrow. Pt performed readback and verbalized understanding that Dr. Irene Limbo will see her in infusion.

## 2018-05-20 ENCOUNTER — Telehealth: Payer: Self-pay | Admitting: Hematology

## 2018-05-20 ENCOUNTER — Inpatient Hospital Stay: Payer: Medicare Other

## 2018-05-20 ENCOUNTER — Inpatient Hospital Stay (HOSPITAL_BASED_OUTPATIENT_CLINIC_OR_DEPARTMENT_OTHER): Payer: Medicare Other | Admitting: Hematology

## 2018-05-20 VITALS — BP 119/74 | HR 117 | Temp 98.6°F | Resp 16 | Wt 73.5 lb

## 2018-05-20 DIAGNOSIS — M545 Low back pain: Secondary | ICD-10-CM | POA: Diagnosis not present

## 2018-05-20 DIAGNOSIS — J439 Emphysema, unspecified: Secondary | ICD-10-CM

## 2018-05-20 DIAGNOSIS — M8008XS Age-related osteoporosis with current pathological fracture, vertebra(e), sequela: Secondary | ICD-10-CM | POA: Diagnosis not present

## 2018-05-20 DIAGNOSIS — M546 Pain in thoracic spine: Secondary | ICD-10-CM | POA: Diagnosis not present

## 2018-05-20 DIAGNOSIS — I700 Atherosclerosis of aorta: Secondary | ICD-10-CM | POA: Diagnosis not present

## 2018-05-20 DIAGNOSIS — R748 Abnormal levels of other serum enzymes: Secondary | ICD-10-CM | POA: Diagnosis not present

## 2018-05-20 DIAGNOSIS — Z79899 Other long term (current) drug therapy: Secondary | ICD-10-CM

## 2018-05-20 DIAGNOSIS — M25552 Pain in left hip: Secondary | ICD-10-CM | POA: Diagnosis not present

## 2018-05-20 DIAGNOSIS — C9000 Multiple myeloma not having achieved remission: Secondary | ICD-10-CM

## 2018-05-20 DIAGNOSIS — M1990 Unspecified osteoarthritis, unspecified site: Secondary | ICD-10-CM

## 2018-05-20 DIAGNOSIS — K7031 Alcoholic cirrhosis of liver with ascites: Secondary | ICD-10-CM

## 2018-05-20 DIAGNOSIS — Z7189 Other specified counseling: Secondary | ICD-10-CM

## 2018-05-20 DIAGNOSIS — X58XXXS Exposure to other specified factors, sequela: Secondary | ICD-10-CM

## 2018-05-20 DIAGNOSIS — Z87891 Personal history of nicotine dependence: Secondary | ICD-10-CM

## 2018-05-20 DIAGNOSIS — Z5111 Encounter for antineoplastic chemotherapy: Secondary | ICD-10-CM | POA: Diagnosis not present

## 2018-05-20 DIAGNOSIS — Z23 Encounter for immunization: Secondary | ICD-10-CM | POA: Diagnosis not present

## 2018-05-20 LAB — CBC WITH DIFFERENTIAL/PLATELET
ABS IMMATURE GRANULOCYTES: 0.04 10*3/uL (ref 0.00–0.07)
BASOS ABS: 0 10*3/uL (ref 0.0–0.1)
BASOS PCT: 0 %
EOS PCT: 1 %
Eosinophils Absolute: 0.1 10*3/uL (ref 0.0–0.5)
HCT: 39.6 % (ref 36.0–46.0)
Hemoglobin: 14.3 g/dL (ref 12.0–15.0)
Immature Granulocytes: 1 %
Lymphocytes Relative: 14 %
Lymphs Abs: 0.9 10*3/uL (ref 0.7–4.0)
MCH: 37.4 pg — AB (ref 26.0–34.0)
MCHC: 36.1 g/dL — AB (ref 30.0–36.0)
MCV: 103.7 fL — ABNORMAL HIGH (ref 80.0–100.0)
MONO ABS: 1.3 10*3/uL — AB (ref 0.1–1.0)
Monocytes Relative: 20 %
NEUTROS ABS: 4.3 10*3/uL (ref 1.7–7.7)
NRBC: 0 % (ref 0.0–0.2)
Neutrophils Relative %: 64 %
Platelets: 158 10*3/uL (ref 150–400)
RBC: 3.82 MIL/uL — ABNORMAL LOW (ref 3.87–5.11)
RDW: 13.4 % (ref 11.5–15.5)
WBC: 6.7 10*3/uL (ref 4.0–10.5)

## 2018-05-20 LAB — CMP (CANCER CENTER ONLY)
ALT: 20 U/L (ref 0–44)
ANION GAP: 9 (ref 5–15)
AST: 42 U/L — AB (ref 15–41)
Albumin: 2.9 g/dL — ABNORMAL LOW (ref 3.5–5.0)
Alkaline Phosphatase: 272 U/L — ABNORMAL HIGH (ref 38–126)
BILIRUBIN TOTAL: 1.1 mg/dL (ref 0.3–1.2)
BUN: 8 mg/dL (ref 8–23)
CHLORIDE: 99 mmol/L (ref 98–111)
CO2: 26 mmol/L (ref 22–32)
Calcium: 8.6 mg/dL — ABNORMAL LOW (ref 8.9–10.3)
Creatinine: 0.63 mg/dL (ref 0.44–1.00)
Glucose, Bld: 100 mg/dL — ABNORMAL HIGH (ref 70–99)
POTASSIUM: 4.8 mmol/L (ref 3.5–5.1)
Sodium: 134 mmol/L — ABNORMAL LOW (ref 135–145)
TOTAL PROTEIN: 6 g/dL — AB (ref 6.5–8.1)

## 2018-05-20 MED ORDER — BORTEZOMIB CHEMO SQ INJECTION 3.5 MG (2.5MG/ML)
1.0000 mg/m2 | Freq: Once | INTRAMUSCULAR | Status: AC
  Administered 2018-05-20: 1.25 mg via SUBCUTANEOUS
  Filled 2018-05-20: qty 0.5

## 2018-05-20 MED ORDER — DEXAMETHASONE 4 MG PO TABS
40.0000 mg | ORAL_TABLET | Freq: Once | ORAL | Status: AC
  Administered 2018-05-20: 40 mg via ORAL

## 2018-05-20 MED ORDER — PROCHLORPERAZINE MALEATE 10 MG PO TABS
ORAL_TABLET | ORAL | Status: AC
  Filled 2018-05-20: qty 1

## 2018-05-20 MED ORDER — DEXAMETHASONE 4 MG PO TABS
ORAL_TABLET | ORAL | Status: AC
  Filled 2018-05-20: qty 10

## 2018-05-20 MED ORDER — PROCHLORPERAZINE MALEATE 10 MG PO TABS
10.0000 mg | ORAL_TABLET | Freq: Once | ORAL | Status: AC
  Administered 2018-05-20: 10 mg via ORAL

## 2018-05-20 NOTE — Progress Notes (Signed)
[facility]    HEMATOLOGY/ONCOLOGY CLINIC NOTE  Date of Service: 05/20/18     Patient Care Team: Lanice Shirts, MD as PCP - General (Internal Medicine) Jeralene Peters, DDS as Dietitian (Periodontics)  Orthopedics - Dr. Layne Benton from [facility]   CHIEF COMPLAINTS/PURPOSE OF CONSULTATION:   F/u for newly diagnosed multiple myeloma  HISTORY OF PRESENTING ILLNESS:   Cassidy Barrett is a wonderful 72 y.o. female who has been referred to [place] by Dr Wandra Feinstein for evaluation and management of possible plasma cell dyscrasia.  Patient has a history of liver cirrhosis with ascites, alcohol abuse, emphysema, macrocytic anemia, osteoporosis and was seen by orthopedics for spinal vertebral compression fractures in the setting of osteoporosis.  She was noted to have multiple significant lab abnormalities including an elevated alkaline phosphatase and high total protein level.  Chronic appealing L1-L2 compression fractures  Patient subsequently had a bone scan to evaluate these compression fractures on 09/26/2015.  This showed mild increased activity within the superior endplates of L3, L4 and L5 likely representing subacute fractures. Increased activity within the T2 vertebral body compatible with known compression fracture. No evidence of suggest bony metastatic disease.  As a part of her workup she also had an SPEP done which shows 3 monoclonal protein bands for a total M protein of 2.3 g/dL. On IFE this is noted to be monoclonal IgA kappa protein. Her sedimentation rate was also elevated at 73.  She was referred to [place] for further evaluation to rule out multiple myeloma. Patient notes her mid and lower back pain related to her compression fractures. Also notes some left hip pain. Her labs from January 2017 did not show any anemia, hypercalcemia or renal failure.  She notes that she did have a colonoscopy in 2015 which demonstrated a 4.5 cm rectal polyp which was  removed piecemeal. Pathology showed a tubulovillous adenoma. Patient was also noted to have significant diverticulosis and was not recommended routine colonoscopies. Given her CT scan showing concern of rectal mass she was referred back to Dr. Henrene Pastor at [facility] for consideration of a sigmoidoscopy/colonoscopy. Patient notes no overt rectal bleeding. No obstructive symptoms. No change in bowel habits. No new abdominal pain. She also needs to connect with gi for management of her liver cirrhosis and related issues.   INTERVAL HISTORY:  Cassidy Barrett is here for her scheduled followup for recently diagnosed myeloma. The patient's last visit with [place] was on 04/22/18. She is accompanied today by her partner. The pt reports that she is doing well overall.   The pt reports that she feels tired for the first 3-4 days after her treatment, then begins feeling better. She notes that she has not had any problems tolerating treatment thus far. She has obtained her flu shot.   The pt continues to use her lidocaine patch and CBD cream for her back pain, and notes that her pain is well controlled and improves each day.   The pt adds that she has been eating much better overall and has intentionally tried to increase her consumption. She has gained two pounds in the last 6 weeks.   Lab results today (05/20/18) of CBC w/diff, CMP, and Reticulocytes is as follows: all values are WNL except for RBC at 3.82, MCV at 103.7, MCH at 37.4, MCHC at 36.1, Sodium at 134, Glucose at 100, Calcium at 8.6, Total Protein at 6.0, Albumin at 2.9, AST at 42, Alk Phos at 272. 05/20/18 MMP shows M  spike is down to 0.4g/dl  On review of systems, pt reports some fatigue, improving back pain, eating better, breathing well, and denies nausea, vomiting, fevers, chills, concerns for infections, abdominal pains, and any other symptoms.   MEDICAL HISTORY:  Past Medical History:  Diagnosis Date  . Alcohol abuse   . Arthritis   . Ascites    . Atherosclerosis of aorta ([place])   . Cholelithiasis   . Cirrhosis ([place])   . Complication of anesthesia    Difficult time waking up after having biospy with colonoscopy  . 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 Compression fracture of lumbar vertebra ([facility])     L1 and L2  . Emphysema of lung ([place])   . History of multiple myeloma    smoldering  . Hyponatremia   . Macrocytic anemia   . 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 Osteoporosis   . Pneumonia 04/2014  . Tobacco abuse   . Tubular adenoma   . Vertebral compression fracture ([facility])    ?? 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  SURGICAL HISTORY: Past Surgical History:  Procedure Laterality Date  . COLONOSCOPY N/A 05/24/2014   Procedure: COLONOSCOPY;  Surgeon: Gatha Mayer, MD;  Location: [facility];  Service: Endoscopy;  Laterality: N/A;  MAC if possible ultraslim colonoscope and gastroscope needed  . FLEXIBLE SIGMOIDOSCOPY N/A 05/21/2014   Procedure: FLEXIBLE SIGMOIDOSCOPY;  Surgeon: Gatha Mayer, MD;  Location: [facility];  Service: Endoscopy;  Laterality: N/A;  . KYPHOPLASTY N/A 12/12/2014   Procedure: KYPHOPLASTY;  Surgeon: Phylliss Bob, MD;  Location: [facility];  Service: Orthopedics;  Laterality: N/A;  T10 kyphoplasty  . MANDIBLE FRACTURE SURGERY     X 2  . TONSILLECTOMY      SOCIAL HISTORY: Social History   Socioeconomic History  . Marital status: Married    Spouse name: Not on file  . Number of children: Not on file  . Years of education: Not on file  . Highest education level: Not on file  Occupational History  . Occupation: retired -[employer]  Social Needs  . Financial resource strain: Not on file  . Food insecurity:    Worry: Not on file    Inability: Not on file  . Transportation needs:    Medical: Not on file    Non-medical: Not on file  Tobacco Use  . Smoking status: Former Smoker    Packs/day: 0.25    Years: 46.00    Pack years:  11.50    Types: Cigarettes    Last attempt to quit: 02/24/2017    Years since quitting: 1.2  . Smokeless tobacco: Never Used  Substance and Sexual Activity  . Alcohol use: Yes    Alcohol/week: 0.0 standard drinks    Comment: 1-4 alcoholic beverages daily  . Drug use: No  . Sexual activity: Not on file  Lifestyle  . Physical activity:    Days per week: Not on file    Minutes per session: Not on file  . Stress: Not on file  Relationships  . Social connections:    Talks on phone: Not on file    Gets together: Not on file    Attends religious service: Not on file    Active member of club or organization: Not on file    Attends meetings of clubs or organizations: Not on file    Relationship status: Not on file  . Intimate partner violence:    Fear of current  or ex partner: Not on file    Emotionally abused: Not on file    Physically abused: Not on file    Forced sexual activity: Not on file  Other Topics Concern  . Not on file  Social History Narrative  . Not on file   Patient notes that she is drinking about 1 beer and 1 hard liquor every day - was counseled regarding absolute alcohol abstinence.  FAMILY HISTORY:  No family history of colon cancer.  ALLERGIES:  is allergic to bee venom; benadryl [diphenhydramine]; hydrocodone-acetaminophen; oxycodone; tape; and valium [diazepam].  MEDICATIONS:  Current Outpatient Medications  Medication Sig Dispense Refill  . acyclovir (ZOVIRAX) 400 MG tablet Take 1 tablet (400 mg total) by mouth 2 (two) times daily. 60 tablet 3  . albuterol (PROVENTIL HFA;VENTOLIN HFA) 108 (90 Base) MCG/ACT inhaler INHALE 2 PUFFS INTO THE LUNGS EVERY 6 HOURS AS NEEDED FOR WHEEZING OR SHORTNESS OF BREATH 18 g 1  . ANORO ELLIPTA 62.5-25 MCG/INH AEPB INHALE 1 PUFF INTO THE LUNGS DAILY 60 each 11  . ergocalciferol (VITAMIN D2) 50000 units capsule Take 1 capsule (50,000 Units total) by mouth once a week. 12 capsule 2  . folic acid (FOLVITE) 1 MG tablet Take 1  tablet (1 mg total) by mouth daily. 30 tablet 3  . furosemide (LASIX) 20 MG tablet Take 0.5 tablets (10 mg total) by mouth daily. 30 tablet 1  . lidocaine (LIDODERM) 5 % Place 1-2 patches onto the skin daily as needed (severe pain). Remove & Discard patch within 12 hours or as directed by MD    . magnesium oxide (MAG-OX) 400 (241.3 Mg) MG tablet Take 1 tablet (400 mg total) by mouth daily. 30 tablet 2  . Menthol, Topical Analgesic, (BIOFREEZE EX) Apply topically as directed.    . ondansetron (ZOFRAN) 8 MG tablet Take 1 tablet (8 mg total) by mouth 2 (two) times daily as needed (Nausea or vomiting). (Patient not taking: Reported on 04/22/2018) 30 tablet 1  . OVER THE COUNTER MEDICATION Apply 400 mg topically daily as needed (arthritis/back pain). CBD cream    . pantoprazole (PROTONIX) 40 MG tablet Take 1 tablet (40 mg total) by mouth daily. (Patient not taking: Reported on 04/22/2018) 90 tablet 1  . potassium chloride (KLOR-CON) 20 MEQ packet Take 40 mEq by mouth 2 (two) times daily. (Patient not taking: Reported on 04/22/2018) 180 tablet 0  . potassium chloride SA (K-DUR,KLOR-CON) 20 MEQ tablet Take 1 tablet (20 mEq total) by mouth 2 (two) times daily. 180 tablet 0  . spironolactone (ALDACTONE) 50 MG tablet Take 1 tablet (50 mg total) by mouth daily. 30 tablet 2   No current facility-administered medications for this visit.     REVIEW OF SYSTEMS:    A 10+ POINT REVIEW OF SYSTEMS WAS OBTAINED including neurology, dermatology, psychiatry, cardiac, respiratory, lymph, extremities, GI, GU, Musculoskeletal, constitutional, breasts, reproductive, HEENT.  All pertinent positives are noted in the HPI.  All others are negative.   PHYSICAL EXAMINATION: ECOG PERFORMANCE STATUS: 2 - Symptomatic, <50% confined to bed  VS reviewed  GENERAL:alert, in no acute distress and comfortable SKIN: no acute rashes, no significant lesions EYES: conjunctiva are pink and non-injected, sclera anicteric OROPHARYNX: MMM,  no exudates, no oropharyngeal erythema or ulceration NECK: supple, no JVD LYMPH:  no palpable lymphadenopathy in the cervical, axillary or inguinal regions LUNGS: clear to auscultation b/l with normal respiratory effort HEART: regular rate & rhythm ABDOMEN:  normoactive bowel sounds , non tender, not distended.  No palpable hepatosplenomegaly.  Extremity: no pedal edema PSYCH: alert & oriented x 3 with fluent speech NEURO: no focal motor/sensory deficits   LABORATORY DATA:  I have reviewed the data as listed  . CBC Latest Ref Rng & Units 05/20/2018 05/13/2018 05/06/2018  WBC 4.0 - 10.5 K/uL 6.7 5.8 5.7  Hemoglobin 12.0 - 15.0 g/dL 14.3 14.8 13.0  Hematocrit 36.0 - 46.0 % 39.6 42.0 36.7  Platelets 150 - 400 K/uL 158 164 327    . CMP Latest Ref Rng & Units 05/20/2018 05/13/2018 05/06/2018  Glucose 70 - 99 mg/dL 100(H) 98 108(H)  BUN 8 - 23 mg/dL 8 7(L) 5(L)  Creatinine 0.44 - 1.00 mg/dL 0.63 0.62 0.59  Sodium 135 - 145 mmol/L 134(L) 135 133(L)  Potassium 3.5 - 5.1 mmol/L 4.8 4.8 4.7  Chloride 98 - 111 mmol/L 99 99 99  CO2 22 - 32 mmol/L _0 Calcium 8.9 - 10.3 mg/dL 8.6(L) 9.5 8.9  Total Protein 6.5 - 8.1 g/dL 6.0(L) 6.8 6.3(L)  Total Bilirubin 0.3 - 1.2 mg/dL 1.1 1.3(H) 1.1  Alkaline Phos 38 - 126 U/L 272(H) 287(H) 259(H)  AST 15 - 41 U/L 42(H) 51(H) 49(H)  ALT 0 - 44 U/L _1 . Lab Results  Component Value Date   IRON 136 05/06/2018   TIBC 274 05/06/2018   IRONPCTSAT 50 05/06/2018   (Iron and TIBC)  Lab Results  Component Value Date   FERRITIN 1,066 (H) 05/06/2018    10/16/15 BM Bx:   03/21/18 BM Bx:    RADIOGRAPHIC STUDIES: I have personally reviewed the radiological images as listed and agreed with the findings in the report. No results found.   ASSESSMENT & PLAN:   72 y.o. Caucasian female with severe osteoporosis and vertebral compression fractures  #1 IgA kappa Multiple myeloma    M protein level increased significantly to 3 (  1.5-->1.8 ---> 1.9-->1.8-->1.7--> 3-->3.4-->3.7---> 0.4 Noted to have new anemia with a hgb of 10 Labs with no signs of  hypercalcemia or renal failure. No overt focal new bone pains. Bm Bx shows 21% abnormal plasma cells. Rpt Bone Survey on 05/12/2016 -- showed no evidence of lytic lesions.  02/28/18 Bone Survey which revealed New T7 compression deformity. Multiple chronic compression deformities are noted. No definitive lytic lesions are identified.   03/25/18 PET/CT revealed No evidence of active multiple myeloma within the skeleton. 2. No soft tissue mass to suggest plasmacytoma. 3. Aortic Atherosclerosis and Emphysema. 03/21/18 BM Bx revealed 30% atypical plasma cells   PLAN: -Will hold Calcitonin spray -Continue follow up with Dr. Carlean Purl in GI  -Will consider [place] further into treatment to ensure tolerance to treatment  -will consider increasing velcade dose to 1.83m/m2 if treatment tolerated considering improved bilirubin levels -Continue optimizing food intake and calcium rich foods -Discussed pt labwork today, 05/20/18; HGB has normalized to 14.3, blood counts and chemistries are otherwise stable  -Discussed the 05/13/18 MMP which revealed IgA down to 472 and M Protein down to 0.4, suggesting a partial response after two cycles. SFLC normalized as well on 05/13/18 labs.  -Continue Vitamin D replacement and a Vitamin B complex  -The pt has no prohibitive toxicities from continuing dose reduced Velcade (143mm2) and 2062mexamethasone at this time.   -Continue Xgeva every 4 weeks -Continue Acyclovir     #2 liver cirrhosis with known varices. Increased bilirubin levels. Plan -Counseled on absolute alcohol cessation. -continue f/u with PCP  #3 osteoporosis with vertebral  compression fractures.  Plan  -continue aggressive vitamin D replacement . -Pt has had full dental extraction and has recovered well with dental clearance to begin Xgeva.  -continue Xgeva q4weeks   Continue  weekly Vekcade as per orders Continue Marchelle Folks RTC with Dr Irene Limbo in 4 weeks Labs weekly   All of the patients and her female partners questions were answered to their apparent satisfaction. The patient knows to call the clinic with any problems, questions or concerns.  The total time spent in the appt was 25 minutes and more than 50% was on counseling and direct patient cares.     Sullivan Lone MD MS AAHIVMS [facility] Hematology/Oncology Physician [facility]  (Office):       [phone] (Work cell):  [phone] (Fax):           [phone]  I, Baldwin Jamaica, am acting as a scribe for Dr. Irene Limbo  .I have reviewed the above documentation for accuracy and completeness, and I agree with the above. Brunetta Genera MD

## 2018-05-20 NOTE — Progress Notes (Signed)
Dr. Irene Limbo will see in infusion.  Okay to proceed with treatment of Velcade per Dr. Irene Limbo.

## 2018-05-20 NOTE — Telephone Encounter (Signed)
Scheduled appt per 10/25 los - gave patient aVS And calender per los. - r/s appt from Friday to thursdays per patient request.

## 2018-05-20 NOTE — Patient Instructions (Signed)
[facility] Discharge Instructions for Patients Receiving Chemotherapy  Today you received the following chemotherapy agents Velcade.  To help prevent nausea and vomiting after your treatment, we encourage you to take your nausea medication as directed.  If you develop nausea and vomiting that is not controlled by your nausea medication, call the clinic.   BELOW ARE SYMPTOMS THAT SHOULD BE REPORTED IMMEDIATELY:  *FEVER GREATER THAN 100.5 F  *CHILLS WITH OR WITHOUT FEVER  NAUSEA AND VOMITING THAT IS NOT CONTROLLED WITH YOUR NAUSEA MEDICATION  *UNUSUAL SHORTNESS OF BREATH  *UNUSUAL BRUISING OR BLEEDING  TENDERNESS IN MOUTH AND THROAT WITH OR WITHOUT PRESENCE OF ULCERS  *URINARY PROBLEMS  *BOWEL PROBLEMS  UNUSUAL RASH Items with * indicate a potential emergency and should be followed up as soon as possible.  Feel free to call the clinic should you have any questions or concerns. The clinic phone number is [phone].  Please show the CHEMO ALERT CARD at check-in to the Emergency Department and triage nurse.   

## 2018-05-23 LAB — KAPPA/LAMBDA LIGHT CHAINS
KAPPA, LAMDA LIGHT CHAIN RATIO: 0.89 (ref 0.26–1.65)
Kappa free light chain: 10.9 mg/L (ref 3.3–19.4)
LAMDA FREE LIGHT CHAINS: 12.3 mg/L (ref 5.7–26.3)

## 2018-05-24 LAB — MULTIPLE MYELOMA PANEL, SERUM
ALBUMIN SERPL ELPH-MCNC: 3 g/dL (ref 2.9–4.4)
ALPHA 1: 0.3 g/dL (ref 0.0–0.4)
ALPHA2 GLOB SERPL ELPH-MCNC: 0.7 g/dL (ref 0.4–1.0)
Albumin/Glob SerPl: 1.1 (ref 0.7–1.7)
B-GLOBULIN SERPL ELPH-MCNC: 1 g/dL (ref 0.7–1.3)
GAMMA GLOB SERPL ELPH-MCNC: 0.8 g/dL (ref 0.4–1.8)
GLOBULIN, TOTAL: 2.9 g/dL (ref 2.2–3.9)
IGG (IMMUNOGLOBIN G), SERUM: 821 mg/dL (ref 700–1600)
IgA: 472 mg/dL — ABNORMAL HIGH (ref 87–352)
IgM (Immunoglobulin M), Srm: 23 mg/dL — ABNORMAL LOW (ref 26–217)
M PROTEIN SERPL ELPH-MCNC: 0.4 g/dL — AB
TOTAL PROTEIN ELP: 5.9 g/dL — AB (ref 6.0–8.5)

## 2018-05-25 ENCOUNTER — Encounter: Payer: Self-pay | Admitting: Internal Medicine

## 2018-05-25 ENCOUNTER — Ambulatory Visit (INDEPENDENT_AMBULATORY_CARE_PROVIDER_SITE_OTHER): Payer: Medicare Other | Admitting: Internal Medicine

## 2018-05-25 VITALS — BP 104/70 | HR 124 | Ht 60.0 in | Wt 75.4 lb

## 2018-05-25 DIAGNOSIS — Z860101 Personal history of adenomatous and serrated colon polyps: Secondary | ICD-10-CM

## 2018-05-25 DIAGNOSIS — Z86010 Personal history of colonic polyps: Secondary | ICD-10-CM | POA: Diagnosis not present

## 2018-05-25 DIAGNOSIS — F1010 Alcohol abuse, uncomplicated: Secondary | ICD-10-CM | POA: Diagnosis not present

## 2018-05-25 DIAGNOSIS — K7031 Alcoholic cirrhosis of liver with ascites: Secondary | ICD-10-CM | POA: Diagnosis not present

## 2018-05-25 DIAGNOSIS — C9000 Multiple myeloma not having achieved remission: Secondary | ICD-10-CM | POA: Diagnosis not present

## 2018-05-25 NOTE — Patient Instructions (Signed)
  Please call us in December for a January appointment.   Congratulations on cutting down drinking, please continue to try and stop.    I appreciate the opportunity to care for you. Silvano Rusk, MD, [facility]

## 2018-05-25 NOTE — Assessment & Plan Note (Signed)
Stable Continue current RX RTC 3 mos

## 2018-05-25 NOTE — Assessment & Plan Note (Signed)
Not a candidate for recall routine colonoscopy due to comorbidities

## 2018-05-25 NOTE — Assessment & Plan Note (Signed)
Currently on Tx [facility] Dr. Irene Limbo if vaccinations for HAV/HBV ok w/ chemo ? If immune response blunted

## 2018-05-25 NOTE — Progress Notes (Signed)
Cassidy Barrett 72 y.o. 08-Mar-1946 [id number]  Assessment & Plan:   Alcoholic cirrhosis Stable Continue current RX RTC 3 mos  Multiple myeloma ([place]) Currently on Tx [place] Dr. Irene Limbo if vaccinations for HAV/HBV ok w/ chemo ? If immune response blunted   Alcohol abuse Reduced Encouraged to get to abstinence  Hx of adenomatous polyp of rectum Not a candidate for recall routine colonoscopy due to comorbidities   I appreciate the opportunity to care for this patient. CC: Schoenhoff, Altamese Cabal, MD Dr. Glyn Ade  Subjective:   Chief Complaint: Follow up of cirrhosis  HPI Patient is here, history of alcoholic cirrhosis main complication is been ascites and edema.  Recent EGD no varices.  She is undergoing treatment for the pole myeloma at this time.  Brings with her a calendar with weights that range from 71 to 76 pounds.  No real trends fluctuates intermittently.  Thinks her ankles are swelling a little bit.  No new respiratory difficulty.  Wearing a back brace due to her previous compression fractures.  She has greatly reduced alcohol she reports but has not quit.  Drinking some beer or wine. Allergies  Allergen Reactions  . Bee Venom Swelling  . Benadryl [Diphenhydramine]     "climbs the walls"  . Hydrocodone-Acetaminophen     "flips me out"  . Oxycodone Other (See Comments)    Anxiety, restless  . Tape     Skin sensitive to tape  . Valium [Diazepam] Other (See Comments)    Pt states makes her crazy   Current Meds  Medication Sig  . acyclovir (ZOVIRAX) 400 MG tablet Take 1 tablet (400 mg total) by mouth 2 (two) times daily.  Marland Kitchen albuterol (PROVENTIL HFA;VENTOLIN HFA) 108 (90 Base) MCG/ACT inhaler INHALE 2 PUFFS INTO THE LUNGS EVERY 6 HOURS AS NEEDED FOR WHEEZING OR SHORTNESS OF BREATH  . ANORO ELLIPTA 62.5-25 MCG/INH AEPB INHALE 1 PUFF INTO THE LUNGS DAILY  . ergocalciferol (VITAMIN D2) 50000 units capsule Take 1 capsule (50,000 Units total) by mouth once a week.    . folic acid (FOLVITE) 1 MG tablet Take 1 tablet (1 mg total) by mouth daily.  . furosemide (LASIX) 20 MG tablet Take 0.5 tablets (10 mg total) by mouth daily.  Marland Kitchen lidocaine (LIDODERM) 5 % Place 1-2 patches onto the skin daily as needed (severe pain). Remove & Discard patch within 12 hours or as directed by MD  . magnesium oxide (MAG-OX) 400 (241.3 Mg) MG tablet Take 1 tablet (400 mg total) by mouth daily.  . Menthol, Topical Analgesic, (BIOFREEZE EX) Apply topically as directed.  . ondansetron (ZOFRAN) 8 MG tablet Take 1 tablet (8 mg total) by mouth 2 (two) times daily as needed (Nausea or vomiting).  Marland Kitchen OVER THE COUNTER MEDICATION Apply 400 mg topically daily as needed (arthritis/back pain). CBD cream  . pantoprazole (PROTONIX) 40 MG tablet Take 1 tablet (40 mg total) by mouth daily.  . potassium chloride (KLOR-CON) 20 MEQ packet Take 40 mEq by mouth 2 (two) times daily.  . potassium chloride SA (K-DUR,KLOR-CON) 20 MEQ tablet Take 1 tablet (20 mEq total) by mouth 2 (two) times daily.  Marland Kitchen spironolactone (ALDACTONE) 50 MG tablet Take 1 tablet (50 mg total) by mouth daily.   Past Medical History:  Diagnosis Date  . Alcohol abuse   . Arthritis   . Ascites   . Atherosclerosis of aorta ([place])   . Cholelithiasis   . Cirrhosis ([place])   . Complication of anesthesia  Difficult time waking up after having biospy with colonoscopy  . 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 Compression fracture of lumbar vertebra ([facility])     L1 and L2  . Emphysema of lung ([place])   . History of multiple myeloma    smoldering  . Hx of adenomatous polyp of rectum 05/21/2014  . Hyponatremia   . Macrocytic anemia   . 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 Osteoporosis   . Pneumonia 04/2014  . Tobacco abuse   . Tubular adenoma   . Vertebral compression fracture [facility])    Past Surgical History:  Procedure Laterality Date  . COLONOSCOPY N/A 05/24/2014   Procedure: COLONOSCOPY;  Surgeon: Gatha Mayer, MD;  [facility];  Service: Endoscopy;  Laterality: N/A;  MAC if possible ultraslim colonoscope and gastroscope needed  . FLEXIBLE SIGMOIDOSCOPY N/A 05/21/2014   Procedure: FLEXIBLE SIGMOIDOSCOPY;  Surgeon: Gatha Mayer, MD;  [facility];  Service: Endoscopy;  Laterality: N/A;  . KYPHOPLASTY N/A 12/12/2014   Procedure: KYPHOPLASTY;  Surgeon: Phylliss Bob, MD;  Location: [place];  Service: Orthopedics;  Laterality: N/A;  T10 kyphoplasty  . MANDIBLE FRACTURE SURGERY     X 2  . TONSILLECTOMY     Social History   Social History Narrative   She is retired from [employer]   Married to [employer]   Uses alcohol heavy in the past,   Former smoker quit in 2019   No substance abuse reported   family history includes ALS in her brother; COPD in her sister; Heart disease in her mother; Other in her father.   Review of Systems Back pain Skin burns (since chemo)  Objective:   Physical Exam BP 104/70 (BP Location: Left Arm, Patient Position: Sitting, Cuff Size: Normal)   Pulse (!) 124   Ht 5' (1.524 m)   Wt 75 lb 6 oz (34.2 kg)   BMI 14.72 kg/m  Frail in wheelchair Anicteric Lungs cta Kyphotic Ht s1s2 no rmg abd soft NT Ext ankle edema

## 2018-05-25 NOTE — Assessment & Plan Note (Signed)
Reduced Encouraged to get to abstinence

## 2018-05-26 DIAGNOSIS — M6281 Muscle weakness (generalized): Secondary | ICD-10-CM | POA: Diagnosis not present

## 2018-05-26 DIAGNOSIS — K7030 Alcoholic cirrhosis of liver without ascites: Secondary | ICD-10-CM | POA: Diagnosis not present

## 2018-05-26 DIAGNOSIS — J439 Emphysema, unspecified: Secondary | ICD-10-CM | POA: Diagnosis not present

## 2018-05-26 DIAGNOSIS — F1011 Alcohol abuse, in remission: Secondary | ICD-10-CM | POA: Diagnosis not present

## 2018-05-26 DIAGNOSIS — C9000 Multiple myeloma not having achieved remission: Secondary | ICD-10-CM | POA: Diagnosis not present

## 2018-05-26 DIAGNOSIS — M8008XD Age-related osteoporosis with current pathological fracture, vertebra(e), subsequent encounter for fracture with routine healing: Secondary | ICD-10-CM | POA: Diagnosis not present

## 2018-05-27 ENCOUNTER — Inpatient Hospital Stay: Payer: Medicare Other

## 2018-05-27 ENCOUNTER — Inpatient Hospital Stay: Payer: Medicare Other | Attending: Hematology

## 2018-05-27 ENCOUNTER — Ambulatory Visit: Payer: Self-pay | Admitting: Hematology

## 2018-05-27 VITALS — BP 105/77 | HR 115 | Temp 98.2°F | Resp 16 | Ht 60.0 in | Wt 77.5 lb

## 2018-05-27 DIAGNOSIS — M1990 Unspecified osteoarthritis, unspecified site: Secondary | ICD-10-CM | POA: Diagnosis not present

## 2018-05-27 DIAGNOSIS — J439 Emphysema, unspecified: Secondary | ICD-10-CM | POA: Diagnosis not present

## 2018-05-27 DIAGNOSIS — R200 Anesthesia of skin: Secondary | ICD-10-CM | POA: Diagnosis not present

## 2018-05-27 DIAGNOSIS — M545 Low back pain: Secondary | ICD-10-CM | POA: Diagnosis not present

## 2018-05-27 DIAGNOSIS — M25552 Pain in left hip: Secondary | ICD-10-CM | POA: Insufficient documentation

## 2018-05-27 DIAGNOSIS — Z87891 Personal history of nicotine dependence: Secondary | ICD-10-CM | POA: Diagnosis not present

## 2018-05-27 DIAGNOSIS — Z5111 Encounter for antineoplastic chemotherapy: Secondary | ICD-10-CM | POA: Insufficient documentation

## 2018-05-27 DIAGNOSIS — X58XXXS Exposure to other specified factors, sequela: Secondary | ICD-10-CM | POA: Diagnosis not present

## 2018-05-27 DIAGNOSIS — C9000 Multiple myeloma not having achieved remission: Secondary | ICD-10-CM | POA: Diagnosis not present

## 2018-05-27 DIAGNOSIS — I700 Atherosclerosis of aorta: Secondary | ICD-10-CM | POA: Diagnosis not present

## 2018-05-27 DIAGNOSIS — K7031 Alcoholic cirrhosis of liver with ascites: Secondary | ICD-10-CM | POA: Diagnosis not present

## 2018-05-27 DIAGNOSIS — M8008XS Age-related osteoporosis with current pathological fracture, vertebra(e), sequela: Secondary | ICD-10-CM | POA: Diagnosis not present

## 2018-05-27 DIAGNOSIS — Z7189 Other specified counseling: Secondary | ICD-10-CM

## 2018-05-27 DIAGNOSIS — R748 Abnormal levels of other serum enzymes: Secondary | ICD-10-CM | POA: Insufficient documentation

## 2018-05-27 DIAGNOSIS — Z79899 Other long term (current) drug therapy: Secondary | ICD-10-CM | POA: Diagnosis not present

## 2018-05-27 DIAGNOSIS — R202 Paresthesia of skin: Secondary | ICD-10-CM | POA: Insufficient documentation

## 2018-05-27 LAB — CMP (CANCER CENTER ONLY)
ALBUMIN: 2.9 g/dL — AB (ref 3.5–5.0)
ALK PHOS: 261 U/L — AB (ref 38–126)
ALT: 19 U/L (ref 0–44)
AST: 42 U/L — AB (ref 15–41)
Anion gap: 8 (ref 5–15)
BUN: 8 mg/dL (ref 8–23)
CO2: 27 mmol/L (ref 22–32)
Calcium: 8.3 mg/dL — ABNORMAL LOW (ref 8.9–10.3)
Chloride: 101 mmol/L (ref 98–111)
Creatinine: 0.62 mg/dL (ref 0.44–1.00)
GFR, Est AFR Am: >60 mL/min (ref >60)
GFR, Estimated: >60 mL/min (ref >60)
GLUCOSE: 96 mg/dL (ref 70–99)
Potassium: 4.6 mmol/L (ref 3.5–5.1)
SODIUM: 136 mmol/L (ref 135–145)
Total Bilirubin: 1.4 mg/dL — ABNORMAL HIGH (ref 0.3–1.2)
Total Protein: 5.6 g/dL — ABNORMAL LOW (ref 6.5–8.1)

## 2018-05-27 LAB — CBC WITH DIFFERENTIAL/PLATELET
ABS IMMATURE GRANULOCYTES: 0.03 10*3/uL (ref 0.00–0.07)
Basophils Absolute: 0 10*3/uL (ref 0.0–0.1)
Basophils Relative: 0 %
Eosinophils Absolute: 0.1 10*3/uL (ref 0.0–0.5)
Eosinophils Relative: 1 %
HEMATOCRIT: 37.4 % (ref 36.0–46.0)
Hemoglobin: 13.1 g/dL (ref 12.0–15.0)
IMMATURE GRANULOCYTES: 0 %
LYMPHS ABS: 0.8 10*3/uL (ref 0.7–4.0)
Lymphocytes Relative: 10 %
MCH: 36.5 pg — AB (ref 26.0–34.0)
MCHC: 35 g/dL (ref 30.0–36.0)
MCV: 104.2 fL — AB (ref 80.0–100.0)
MONO ABS: 1 10*3/uL (ref 0.1–1.0)
MONOS PCT: 13 %
NEUTROS ABS: 5.9 10*3/uL (ref 1.7–7.7)
Neutrophils Relative %: 76 %
Platelets: 138 10*3/uL — ABNORMAL LOW (ref 150–400)
RBC: 3.59 MIL/uL — ABNORMAL LOW (ref 3.87–5.11)
RDW: 13.4 % (ref 11.5–15.5)
WBC: 7.9 10*3/uL (ref 4.0–10.5)
nRBC: 0 % (ref 0.0–0.2)

## 2018-05-27 MED ORDER — DEXAMETHASONE 4 MG PO TABS
20.0000 mg | ORAL_TABLET | Freq: Once | ORAL | Status: AC
  Administered 2018-05-27: 20 mg via ORAL

## 2018-05-27 MED ORDER — DEXAMETHASONE 4 MG PO TABS
ORAL_TABLET | ORAL | Status: AC
  Filled 2018-05-27: qty 5

## 2018-05-27 MED ORDER — PROCHLORPERAZINE MALEATE 10 MG PO TABS
10.0000 mg | ORAL_TABLET | Freq: Once | ORAL | Status: AC
  Administered 2018-05-27: 10 mg via ORAL

## 2018-05-27 MED ORDER — BORTEZOMIB CHEMO SQ INJECTION 3.5 MG (2.5MG/ML)
1.0000 mg/m2 | Freq: Once | INTRAMUSCULAR | Status: AC
  Administered 2018-05-27: 1.25 mg via SUBCUTANEOUS
  Filled 2018-05-27: qty 0.5

## 2018-05-27 MED ORDER — PROCHLORPERAZINE MALEATE 10 MG PO TABS
ORAL_TABLET | ORAL | Status: AC
  Filled 2018-05-27: qty 1

## 2018-05-27 NOTE — Patient Instructions (Signed)
[facility] Discharge Instructions for Patients Receiving Chemotherapy  Today you received the following chemotherapy agents: Bortezomib (Velcade)  To help prevent nausea and vomiting after your treatment, we encourage you to take your nausea medication as directed.    If you develop nausea and vomiting that is not controlled by your nausea medication, call the clinic.   BELOW ARE SYMPTOMS THAT SHOULD BE REPORTED IMMEDIATELY:  *FEVER GREATER THAN 100.5 F  *CHILLS WITH OR WITHOUT FEVER  NAUSEA AND VOMITING THAT IS NOT CONTROLLED WITH YOUR NAUSEA MEDICATION  *UNUSUAL SHORTNESS OF BREATH  *UNUSUAL BRUISING OR BLEEDING  TENDERNESS IN MOUTH AND THROAT WITH OR WITHOUT PRESENCE OF ULCERS  *URINARY PROBLEMS  *BOWEL PROBLEMS  UNUSUAL RASH Items with * indicate a potential emergency and should be followed up as soon as possible.  Feel free to call the clinic should you have any questions or concerns. The clinic phone number is [phone].  Please show the CHEMO ALERT CARD at check-in to the Emergency Department and triage nurse.   

## 2018-05-30 DIAGNOSIS — J439 Emphysema, unspecified: Secondary | ICD-10-CM | POA: Diagnosis not present

## 2018-05-30 DIAGNOSIS — M8008XD Age-related osteoporosis with current pathological fracture, vertebra(e), subsequent encounter for fracture with routine healing: Secondary | ICD-10-CM | POA: Diagnosis not present

## 2018-05-30 DIAGNOSIS — M6281 Muscle weakness (generalized): Secondary | ICD-10-CM | POA: Diagnosis not present

## 2018-05-30 DIAGNOSIS — K7030 Alcoholic cirrhosis of liver without ascites: Secondary | ICD-10-CM | POA: Diagnosis not present

## 2018-05-30 DIAGNOSIS — C9000 Multiple myeloma not having achieved remission: Secondary | ICD-10-CM | POA: Diagnosis not present

## 2018-05-30 DIAGNOSIS — F1011 Alcohol abuse, in remission: Secondary | ICD-10-CM | POA: Diagnosis not present

## 2018-05-30 LAB — KAPPA/LAMBDA LIGHT CHAINS
Kappa free light chain: 8.8 mg/L (ref 3.3–19.4)
Kappa, lambda light chain ratio: 0.83 (ref 0.26–1.65)
LAMDA FREE LIGHT CHAINS: 10.6 mg/L (ref 5.7–26.3)

## 2018-06-01 LAB — MULTIPLE MYELOMA PANEL, SERUM
ALBUMIN SERPL ELPH-MCNC: 2.9 g/dL (ref 2.9–4.4)
ALBUMIN/GLOB SERPL: 1.3 (ref 0.7–1.7)
ALPHA 1: 0.3 g/dL (ref 0.0–0.4)
ALPHA2 GLOB SERPL ELPH-MCNC: 0.6 g/dL (ref 0.4–1.0)
B-Globulin SerPl Elph-Mcnc: 0.9 g/dL (ref 0.7–1.3)
GLOBULIN, TOTAL: 2.4 g/dL (ref 2.2–3.9)
Gamma Glob SerPl Elph-Mcnc: 0.7 g/dL (ref 0.4–1.8)
IGG (IMMUNOGLOBIN G), SERUM: 790 mg/dL (ref 700–1600)
IGM (IMMUNOGLOBULIN M), SRM: 20 mg/dL — AB (ref 26–217)
IgA: 308 mg/dL (ref 87–352)
M Protein SerPl Elph-Mcnc: 0.2 g/dL — ABNORMAL HIGH
Total Protein ELP: 5.3 g/dL — ABNORMAL LOW (ref 6.0–8.5)

## 2018-06-02 ENCOUNTER — Inpatient Hospital Stay: Payer: Medicare Other

## 2018-06-02 VITALS — BP 116/65 | HR 114 | Temp 98.0°F | Resp 16

## 2018-06-02 DIAGNOSIS — Z5111 Encounter for antineoplastic chemotherapy: Secondary | ICD-10-CM | POA: Diagnosis not present

## 2018-06-02 DIAGNOSIS — C9000 Multiple myeloma not having achieved remission: Secondary | ICD-10-CM

## 2018-06-02 DIAGNOSIS — M8008XS Age-related osteoporosis with current pathological fracture, vertebra(e), sequela: Secondary | ICD-10-CM | POA: Diagnosis not present

## 2018-06-02 DIAGNOSIS — Z7189 Other specified counseling: Secondary | ICD-10-CM

## 2018-06-02 DIAGNOSIS — R748 Abnormal levels of other serum enzymes: Secondary | ICD-10-CM | POA: Diagnosis not present

## 2018-06-02 DIAGNOSIS — J439 Emphysema, unspecified: Secondary | ICD-10-CM | POA: Diagnosis not present

## 2018-06-02 DIAGNOSIS — K7031 Alcoholic cirrhosis of liver with ascites: Secondary | ICD-10-CM | POA: Diagnosis not present

## 2018-06-02 LAB — CMP (CANCER CENTER ONLY)
ALBUMIN: 3 g/dL — AB (ref 3.5–5.0)
ALK PHOS: 210 U/L — AB (ref 38–126)
ALT: 21 U/L (ref 0–44)
ANION GAP: 7 (ref 5–15)
AST: 45 U/L — AB (ref 15–41)
BUN: 9 mg/dL (ref 8–23)
CO2: 26 mmol/L (ref 22–32)
CREATININE: 0.61 mg/dL (ref 0.44–1.00)
Calcium: 8.5 mg/dL — ABNORMAL LOW (ref 8.9–10.3)
Chloride: 101 mmol/L (ref 98–111)
GFR, Est AFR Am: >60 mL/min (ref >60)
GFR, Estimated: >60 mL/min (ref >60)
GLUCOSE: 94 mg/dL (ref 70–99)
Potassium: 4.7 mmol/L (ref 3.5–5.1)
SODIUM: 134 mmol/L — AB (ref 135–145)
Total Bilirubin: 1 mg/dL (ref 0.3–1.2)
Total Protein: 5.8 g/dL — ABNORMAL LOW (ref 6.5–8.1)

## 2018-06-02 LAB — CBC WITH DIFFERENTIAL/PLATELET
ABS IMMATURE GRANULOCYTES: 0.03 10*3/uL (ref 0.00–0.07)
Basophils Absolute: 0 10*3/uL (ref 0.0–0.1)
Basophils Relative: 0 %
Eosinophils Absolute: 0.1 10*3/uL (ref 0.0–0.5)
Eosinophils Relative: 2 %
HEMATOCRIT: 38.4 % (ref 36.0–46.0)
HEMOGLOBIN: 13.4 g/dL (ref 12.0–15.0)
Immature Granulocytes: 1 %
LYMPHS PCT: 11 %
Lymphs Abs: 0.7 10*3/uL (ref 0.7–4.0)
MCH: 36.2 pg — AB (ref 26.0–34.0)
MCHC: 34.9 g/dL (ref 30.0–36.0)
MCV: 103.8 fL — AB (ref 80.0–100.0)
MONO ABS: 0.9 10*3/uL (ref 0.1–1.0)
Monocytes Relative: 15 %
NEUTROS ABS: 4 10*3/uL (ref 1.7–7.7)
NEUTROS PCT: 71 %
NRBC: 0.5 % — AB (ref 0.0–0.2)
Platelets: 151 10*3/uL (ref 150–400)
RBC: 3.7 MIL/uL — AB (ref 3.87–5.11)
RDW: 13.8 % (ref 11.5–15.5)
WBC: 5.7 10*3/uL (ref 4.0–10.5)

## 2018-06-02 MED ORDER — PROCHLORPERAZINE MALEATE 10 MG PO TABS
ORAL_TABLET | ORAL | Status: AC
  Filled 2018-06-02: qty 1

## 2018-06-02 MED ORDER — DENOSUMAB 120 MG/1.7ML ~~LOC~~ SOLN
120.0000 mg | Freq: Once | SUBCUTANEOUS | Status: AC
  Administered 2018-06-02: 120 mg via SUBCUTANEOUS

## 2018-06-02 MED ORDER — DEXAMETHASONE 4 MG PO TABS
ORAL_TABLET | ORAL | Status: AC
  Filled 2018-06-02: qty 5

## 2018-06-02 MED ORDER — DENOSUMAB 120 MG/1.7ML ~~LOC~~ SOLN
SUBCUTANEOUS | Status: AC
  Filled 2018-06-02: qty 1.7

## 2018-06-02 MED ORDER — BORTEZOMIB CHEMO SQ INJECTION 3.5 MG (2.5MG/ML)
1.0000 mg/m2 | Freq: Once | INTRAMUSCULAR | Status: AC
  Administered 2018-06-02: 1.25 mg via SUBCUTANEOUS
  Filled 2018-06-02: qty 0.5

## 2018-06-02 MED ORDER — PROCHLORPERAZINE MALEATE 10 MG PO TABS
10.0000 mg | ORAL_TABLET | Freq: Once | ORAL | Status: AC
  Administered 2018-06-02: 10 mg via ORAL

## 2018-06-02 MED ORDER — DEXAMETHASONE 4 MG PO TABS
20.0000 mg | ORAL_TABLET | Freq: Once | ORAL | Status: AC
  Administered 2018-06-02: 20 mg via ORAL

## 2018-06-02 NOTE — Progress Notes (Signed)
Patient states she is taking a calcium supplement at home. Does not recall how much she is taking. Asked patient to bring supplement to next OV so it could be added to home med list. Patient verbalized understanding.

## 2018-06-02 NOTE — Patient Instructions (Signed)
[facility] Discharge Instructions for Patients Receiving Chemotherapy  Today you received the following chemotherapy agents Velcade.  To help prevent nausea and vomiting after your treatment, we encourage you to take your nausea medication as directed.  If you develop nausea and vomiting that is not controlled by your nausea medication, call the clinic.   BELOW ARE SYMPTOMS THAT SHOULD BE REPORTED IMMEDIATELY:  *FEVER GREATER THAN 100.5 F  *CHILLS WITH OR WITHOUT FEVER  NAUSEA AND VOMITING THAT IS NOT CONTROLLED WITH YOUR NAUSEA MEDICATION  *UNUSUAL SHORTNESS OF BREATH  *UNUSUAL BRUISING OR BLEEDING  TENDERNESS IN MOUTH AND THROAT WITH OR WITHOUT PRESENCE OF ULCERS  *URINARY PROBLEMS  *BOWEL PROBLEMS  UNUSUAL RASH Items with * indicate a potential emergency and should be followed up as soon as possible.  Feel free to call the clinic should you have any questions or concerns. The clinic phone number is [phone].  Please show the CHEMO ALERT CARD at check-in to the Emergency Department and triage nurse.   

## 2018-06-03 ENCOUNTER — Other Ambulatory Visit: Payer: Self-pay

## 2018-06-03 ENCOUNTER — Ambulatory Visit: Payer: Self-pay

## 2018-06-03 LAB — KAPPA/LAMBDA LIGHT CHAINS
KAPPA FREE LGHT CHN: 9.9 mg/L (ref 3.3–19.4)
Kappa, lambda light chain ratio: 1.01 (ref 0.26–1.65)
Lambda free light chains: 9.8 mg/L (ref 5.7–26.3)

## 2018-06-06 ENCOUNTER — Encounter: Payer: Self-pay | Admitting: Hematology

## 2018-06-06 DIAGNOSIS — F1011 Alcohol abuse, in remission: Secondary | ICD-10-CM | POA: Diagnosis not present

## 2018-06-06 DIAGNOSIS — K7030 Alcoholic cirrhosis of liver without ascites: Secondary | ICD-10-CM | POA: Diagnosis not present

## 2018-06-06 DIAGNOSIS — C9000 Multiple myeloma not having achieved remission: Secondary | ICD-10-CM | POA: Diagnosis not present

## 2018-06-06 DIAGNOSIS — M8008XD Age-related osteoporosis with current pathological fracture, vertebra(e), subsequent encounter for fracture with routine healing: Secondary | ICD-10-CM | POA: Diagnosis not present

## 2018-06-06 DIAGNOSIS — J439 Emphysema, unspecified: Secondary | ICD-10-CM | POA: Diagnosis not present

## 2018-06-06 DIAGNOSIS — M6281 Muscle weakness (generalized): Secondary | ICD-10-CM | POA: Diagnosis not present

## 2018-06-06 LAB — MULTIPLE MYELOMA PANEL, SERUM
Albumin SerPl Elph-Mcnc: 3 g/dL (ref 2.9–4.4)
Albumin/Glob SerPl: 1.3 (ref 0.7–1.7)
Alpha 1: 0.3 g/dL (ref 0.0–0.4)
Alpha2 Glob SerPl Elph-Mcnc: 0.6 g/dL (ref 0.4–1.0)
B-Globulin SerPl Elph-Mcnc: 0.8 g/dL (ref 0.7–1.3)
Gamma Glob SerPl Elph-Mcnc: 0.6 g/dL (ref 0.4–1.8)
Globulin, Total: 2.4 g/dL (ref 2.2–3.9)
IGA: 247 mg/dL (ref 87–352)
IGM (IMMUNOGLOBULIN M), SRM: 20 mg/dL — AB (ref 26–217)
IgG (Immunoglobin G), Serum: 755 mg/dL (ref 700–1600)
M PROTEIN SERPL ELPH-MCNC: 0.2 g/dL — AB
TOTAL PROTEIN ELP: 5.4 g/dL — AB (ref 6.0–8.5)

## 2018-06-07 ENCOUNTER — Telehealth: Payer: Self-pay | Admitting: *Deleted

## 2018-06-07 NOTE — Telephone Encounter (Signed)
Pt spouse sent MyChart message:  Bonnita Nasuti has had a really rough time after the last 2 chemo sessions. She is hurting all over with her skin so sensitive to touch, has had diarrhea for a couple days after each session, and her feet and stomach are swelling more. Can we skip this week's treatment?   Ms. Elnoria Howard called, stated Dr. Irene Limbo had told them the patient might need to skip a treatment if it got to be too much, so they want to skip the Velcade this week. Advised them that it will put treatment a week behind, but she stated that is ok.

## 2018-06-08 ENCOUNTER — Telehealth: Payer: Self-pay | Admitting: *Deleted

## 2018-06-08 DIAGNOSIS — J439 Emphysema, unspecified: Secondary | ICD-10-CM | POA: Diagnosis not present

## 2018-06-08 DIAGNOSIS — K7030 Alcoholic cirrhosis of liver without ascites: Secondary | ICD-10-CM | POA: Diagnosis not present

## 2018-06-08 DIAGNOSIS — M6281 Muscle weakness (generalized): Secondary | ICD-10-CM | POA: Diagnosis not present

## 2018-06-08 DIAGNOSIS — M8008XD Age-related osteoporosis with current pathological fracture, vertebra(e), subsequent encounter for fracture with routine healing: Secondary | ICD-10-CM | POA: Diagnosis not present

## 2018-06-08 DIAGNOSIS — F1011 Alcohol abuse, in remission: Secondary | ICD-10-CM | POA: Diagnosis not present

## 2018-06-08 DIAGNOSIS — C9000 Multiple myeloma not having achieved remission: Secondary | ICD-10-CM | POA: Diagnosis not present

## 2018-06-08 NOTE — Telephone Encounter (Signed)
Left VM for patient at home number: Information regarding skipping this week's treatment shared with Dr. Alen Blew. Per Dr. Alen Blew (in Dr. Grier Mitts absence), ok to skip this week.

## 2018-06-09 ENCOUNTER — Inpatient Hospital Stay: Payer: Medicare Other

## 2018-06-10 ENCOUNTER — Other Ambulatory Visit: Payer: Self-pay

## 2018-06-10 ENCOUNTER — Ambulatory Visit: Payer: Self-pay

## 2018-06-14 DIAGNOSIS — M8008XD Age-related osteoporosis with current pathological fracture, vertebra(e), subsequent encounter for fracture with routine healing: Secondary | ICD-10-CM | POA: Diagnosis not present

## 2018-06-14 DIAGNOSIS — K7030 Alcoholic cirrhosis of liver without ascites: Secondary | ICD-10-CM | POA: Diagnosis not present

## 2018-06-14 DIAGNOSIS — F1011 Alcohol abuse, in remission: Secondary | ICD-10-CM | POA: Diagnosis not present

## 2018-06-14 DIAGNOSIS — J439 Emphysema, unspecified: Secondary | ICD-10-CM | POA: Diagnosis not present

## 2018-06-14 DIAGNOSIS — M6281 Muscle weakness (generalized): Secondary | ICD-10-CM | POA: Diagnosis not present

## 2018-06-14 DIAGNOSIS — C9000 Multiple myeloma not having achieved remission: Secondary | ICD-10-CM | POA: Diagnosis not present

## 2018-06-15 DIAGNOSIS — J439 Emphysema, unspecified: Secondary | ICD-10-CM | POA: Diagnosis not present

## 2018-06-15 DIAGNOSIS — F1011 Alcohol abuse, in remission: Secondary | ICD-10-CM | POA: Diagnosis not present

## 2018-06-15 DIAGNOSIS — C9000 Multiple myeloma not having achieved remission: Secondary | ICD-10-CM | POA: Diagnosis not present

## 2018-06-15 DIAGNOSIS — K7030 Alcoholic cirrhosis of liver without ascites: Secondary | ICD-10-CM | POA: Diagnosis not present

## 2018-06-15 DIAGNOSIS — M6281 Muscle weakness (generalized): Secondary | ICD-10-CM | POA: Diagnosis not present

## 2018-06-15 DIAGNOSIS — M8008XD Age-related osteoporosis with current pathological fracture, vertebra(e), subsequent encounter for fracture with routine healing: Secondary | ICD-10-CM | POA: Diagnosis not present

## 2018-06-16 NOTE — Progress Notes (Signed)
[facility]    HEMATOLOGY/ONCOLOGY CLINIC NOTE  Date of Service: 06/17/18     Patient Care Team: Lanice Shirts, MD as PCP - General (Internal Medicine) Jeralene Peters, DDS as Dietitian (Periodontics)  Orthopedics - Dr. Layne Benton from [facility]   CHIEF COMPLAINTS/PURPOSE OF CONSULTATION:   F/u for continued mx of multiple myeloma  HISTORY OF PRESENTING ILLNESS:   Cassidy Barrett is a wonderful 72 y.o. female who has been referred to [place] by Dr Wandra Feinstein for evaluation and management of possible plasma cell dyscrasia.  Patient has a history of liver cirrhosis with ascites, alcohol abuse, emphysema, macrocytic anemia, osteoporosis and was seen by orthopedics for spinal vertebral compression fractures in the setting of osteoporosis.  She was noted to have multiple significant lab abnormalities including an elevated alkaline phosphatase and high total protein level.  Chronic appealing L1-L2 compression fractures  Patient subsequently had a bone scan to evaluate these compression fractures on 09/26/2015.  This showed mild increased activity within the superior endplates of L3, L4 and L5 likely representing subacute fractures. Increased activity within the T2 vertebral body compatible with known compression fracture. No evidence of suggest bony metastatic disease.  As a part of her workup she also had an SPEP done which shows 3 monoclonal protein bands for a total M protein of 2.3 g/dL. On IFE this is noted to be monoclonal IgA kappa protein. Her sedimentation rate was also elevated at 73.  She was referred to [place] for further evaluation to rule out multiple myeloma. Patient notes her mid and lower back pain related to her compression fractures. Also notes some left hip pain. Her labs from January 2017 did not show any anemia, hypercalcemia or renal failure.  She notes that she did have a colonoscopy in 2015 which demonstrated a 4.5 cm rectal polyp which was  removed piecemeal. Pathology showed a tubulovillous adenoma. Patient was also noted to have significant diverticulosis and was not recommended routine colonoscopies. Given her CT scan showing concern of rectal mass she was referred back to Dr. Henrene Pastor at [place] for consideration of a sigmoidoscopy/colonoscopy. Patient notes no overt rectal bleeding. No obstructive symptoms. No change in bowel habits. No new abdominal pain. She also needs to connect with gi for management of her liver cirrhosis and related issues.   INTERVAL HISTORY:  Cassidy Barrett is here for her scheduled followup for recently diagnosed myeloma. The patient's last visit with [place] was on 05/20/18. She is accompanied today by her partner. The pt reports that she is doing well overall.   The pt reports that she had diarrhea for 24 hours in the interim, which resolved several days ago on its own. She notes that the skin on her extremities has become very sensitive, and experienced pain with last Velcade administration, which is why she cancelled her Velcade injection scheduled for last week. She feels some tingling and numbness in her feet but denies this worsening.   The pt notes that she has gained weight in the interim, now up four pounds in two months to 77 pounds. The pt notes that her energy levels are stable to improved.   Lab results today (06/17/18) of CBC w/diff, CMP is as follows: all values are WNL except for MCV at 103.4, MCH at 36.1, Glucose at 105, BUN at 7, Total Protein 6.3, Albumin 3.4, Alk Phos at 195. 06/17/18 MMP- no M spike   On review of systems, pt reports skin sensitivity on extremities, stable  numbness in feet, stable energy levels, and denies current diarrhea, concerns for infections, abdominal pains, leg swelling, any other symptoms.    MEDICAL HISTORY:  Past Medical History:  Diagnosis Date  . Alcohol abuse   . Arthritis   . Ascites   . Atherosclerosis of aorta ([place])   . Cholelithiasis   .  Cirrhosis ([place])   . Complication of anesthesia    Difficult time waking up after having biospy with colonoscopy  . 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 Compression fracture of lumbar vertebra ([facility])     L1 and L2  . Emphysema of lung ([place])   . History of multiple myeloma    smoldering  . Hx of adenomatous polyp of rectum 05/21/2014  . Hyponatremia   . Macrocytic anemia   . 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 Osteoporosis   . Pneumonia 04/2014  . Tobacco abuse   . Tubular adenoma   . Vertebral compression fracture ([facility])    ?? 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  SURGICAL HISTORY: Past Surgical History:  Procedure Laterality Date  . COLONOSCOPY N/A 05/24/2014   Procedure: COLONOSCOPY;  Surgeon: Gatha Mayer, MD;  [facility];  Service: Endoscopy;  Laterality: N/A;  MAC if possible ultraslim colonoscope and gastroscope needed  . FLEXIBLE SIGMOIDOSCOPY N/A 05/21/2014   Procedure: FLEXIBLE SIGMOIDOSCOPY;  Surgeon: Gatha Mayer, MD;  [facility];  Service: Endoscopy;  Laterality: N/A;  . KYPHOPLASTY N/A 12/12/2014   Procedure: KYPHOPLASTY;  Surgeon: Phylliss Bob, MD;  Location: [place];  Service: Orthopedics;  Laterality: N/A;  T10 kyphoplasty  . MANDIBLE FRACTURE SURGERY     X 2  . TONSILLECTOMY      SOCIAL HISTORY: Social History   Socioeconomic History  . Marital status: Married    Spouse name: Not on file  . Number of children: Not on file  . Years of education: Not on file  . Highest education level: Not on file  Occupational History  . Occupation: retired -[employer]  Social Needs  . Financial resource strain: Not on file  . Food insecurity:    Worry: Not on file    Inability: Not on file  . Transportation needs:    Medical: Not on file    Non-medical: Not on file  Tobacco Use  . Smoking status: Former Smoker    Packs/day: 0.25    Years: 46.00    Pack years: 11.50     Types: Cigarettes    Last attempt to quit: 02/24/2017    Years since quitting: 1.3  . Smokeless tobacco: Never Used  Substance and Sexual Activity  . Alcohol use: Yes    Alcohol/week: 0.0 standard drinks    Comment: 1-4 alcoholic beverages daily  . Drug use: No  . Sexual activity: Not on file  Lifestyle  . Physical activity:    Days per week: Not on file    Minutes per session: Not on file  . Stress: Not on file  Relationships  . Social connections:    Talks on phone: Not on file    Gets together: Not on file    Attends religious service: Not on file    Active member of club or organization: Not on file    Attends meetings of clubs or organizations: Not on file    Relationship status: Not on file  . Intimate partner violence:    Fear of current or ex partner: Not on file  Emotionally abused: Not on file    Physically abused: Not on file    Forced sexual activity: Not on file  Other Topics Concern  . Not on file  Social History Narrative   She is retired from [employer]   Married to Lisa Hart   Uses alcohol heavy in the past,   Former smoker quit in 2019   No substance abuse reported   Patient notes that she is drinking about 1 beer and 1 hard liquor every day - was counseled regarding absolute alcohol abstinence.  FAMILY HISTORY:  No family history of colon cancer.  ALLERGIES:  is allergic to bee venom; benadryl [diphenhydramine]; hydrocodone-acetaminophen; oxycodone; tape; and valium [diazepam].  MEDICATIONS:  Current Outpatient Medications  Medication Sig Dispense Refill  . acyclovir (ZOVIRAX) 400 MG tablet Take 1 tablet (400 mg total) by mouth 2 (two) times daily. 60 tablet 3  . albuterol (PROVENTIL HFA;VENTOLIN HFA) 108 (90 Base) MCG/ACT inhaler INHALE 2 PUFFS INTO THE LUNGS EVERY 6 HOURS AS NEEDED FOR WHEEZING OR SHORTNESS OF BREATH 18 g 1  . ANORO ELLIPTA 62.5-25 MCG/INH AEPB INHALE 1 PUFF INTO THE LUNGS DAILY 60 each 11  . ergocalciferol (VITAMIN D2)  50000 units capsule Take 1 capsule (50,000 Units total) by mouth once a week. 12 capsule 2  . folic acid (FOLVITE) 1 MG tablet Take 1 tablet (1 mg total) by mouth daily. 30 tablet 3  . furosemide (LASIX) 20 MG tablet Take 0.5 tablets (10 mg total) by mouth daily. 30 tablet 1  . ixazomib citrate (NINLARO) 3 MG capsule Take 1 capsule (3 mg total) by mouth once a week. On D1,D8 and D15 every 28 days.Take on an empty stomach 1hr before or 2hrs after food. Do not crush, chew, or open. 3 capsule 2  . lidocaine (LIDODERM) 5 % Place 1-2 patches onto the skin daily as needed (severe pain). Remove & Discard patch within 12 hours or as directed by MD    . magnesium oxide (MAG-OX) 400 (241.3 Mg) MG tablet Take 1 tablet (400 mg total) by mouth daily. 30 tablet 2  . Menthol, Topical Analgesic, (BIOFREEZE EX) Apply topically as directed.    . ondansetron (ZOFRAN) 8 MG tablet Take 1 tablet (8 mg total) by mouth 2 (two) times daily as needed (Nausea or vomiting). 30 tablet 1  . OVER THE COUNTER MEDICATION Apply 400 mg topically daily as needed (arthritis/back pain). CBD cream    . pantoprazole (PROTONIX) 40 MG tablet Take 1 tablet (40 mg total) by mouth daily. 90 tablet 1  . potassium chloride (KLOR-CON) 20 MEQ packet Take 40 mEq by mouth 2 (two) times daily. 180 tablet 0  . potassium chloride SA (K-DUR,KLOR-CON) 20 MEQ tablet Take 1 tablet (20 mEq total) by mouth 2 (two) times daily. 180 tablet 0  . spironolactone (ALDACTONE) 50 MG tablet Take 1 tablet (50 mg total) by mouth daily. 30 tablet 2   No current facility-administered medications for this visit.    Facility-Administered Medications Ordered in Other Visits  Medication Dose Route Frequency Provider Last Rate Last Dose  . bortezomib SQ (VELCADE) chemo injection 1.25 mg  1 mg/m2 (Treatment Plan Recorded) Subcutaneous Once Kale, Gautam Kishore, MD      . dexamethasone (DECADRON) tablet 40 mg  40 mg Oral Once Kale, Gautam Kishore, MD      . prochlorperazine  (COMPAZINE) tablet 10 mg  10 mg Oral Once Kale, Gautam Kishore, MD        REVIEW   OF SYSTEMS:    A 10+ POINT REVIEW OF SYSTEMS WAS OBTAINED including neurology, dermatology, psychiatry, cardiac, respiratory, lymph, extremities, GI, GU, Musculoskeletal, constitutional, breasts, reproductive, HEENT.  All pertinent positives are noted in the HPI.  All others are negative.   PHYSICAL EXAMINATION: ECOG PERFORMANCE STATUS: 2 - Symptomatic, <50% confined to bed  VS reviewed  GENERAL:alert, in no acute distress and comfortable SKIN: no acute rashes, no significant lesions EYES: conjunctiva are pink and non-injected, sclera anicteric OROPHARYNX: MMM, no exudates, no oropharyngeal erythema or ulceration NECK: supple, no JVD LYMPH:  no palpable lymphadenopathy in the cervical, axillary or inguinal regions LUNGS: clear to auscultation b/l with normal respiratory effort HEART: regular rate & rhythm ABDOMEN:  normoactive bowel sounds , non tender, not distended. No palpable hepatosplenomegaly.  Extremity: no pedal edema PSYCH: alert & oriented x 3 with fluent speech NEURO: no focal motor/sensory deficits   LABORATORY DATA:  I have reviewed the data as listed  . CBC Latest Ref Rng & Units 06/17/2018 06/02/2018 05/27/2018  WBC 4.0 - 10.5 K/uL 5.3 5.7 7.9  Hemoglobin 12.0 - 15.0 g/dL 15.0 13.4 13.1  Hematocrit 36.0 - 46.0 % 42.9 38.4 37.4  Platelets 150 - 400 K/uL 231 151 138(L)    . CMP Latest Ref Rng & Units 06/17/2018 06/02/2018 05/27/2018  Glucose 70 - 99 mg/dL 105(H) 94 96  BUN 8 - 23 mg/dL 7(L) 9 8  Creatinine 0.44 - 1.00 mg/dL 0.66 0.61 0.62  Sodium 135 - 145 mmol/L 136 134(L) 136  Potassium 3.5 - 5.1 mmol/L 4.5 4.7 4.6  Chloride 98 - 111 mmol/L 99 101 101  CO2 22 - 32 mmol/L _0 Calcium 8.9 - 10.3 mg/dL 9.5 8.5(L) 8.3(L)  Total Protein 6.5 - 8.1 g/dL 6.3(L) 5.8(L) 5.6(L)  Total Bilirubin 0.3 - 1.2 mg/dL 1.1 1.0 1.4(H)  Alkaline Phos 38 - 126 U/L 195(H) 210(H) 261(H)  AST 15 -  41 U/L 35 45(H) 42(H)  ALT 0 - 44 U/L _1 . Lab Results  Component Value Date   IRON 136 05/06/2018   TIBC 274 05/06/2018   IRONPCTSAT 50 05/06/2018   (Iron and TIBC)  Lab Results  Component Value Date   FERRITIN 1,066 (H) 05/06/2018    10/16/15 BM Bx:   03/21/18 BM Bx:    RADIOGRAPHIC STUDIES: I have personally reviewed the radiological images as listed and agreed with the findings in the report. No results found.   ASSESSMENT & PLAN:   72 y.o. Caucasian female with severe osteoporosis and vertebral compression fractures  #1 IgA kappa Multiple myeloma    M protein level increased significantly to 3 ( 1.5-->1.8 ---> 1.9-->1.8-->1.7--> 3-->3.4-->3.7---> 0.4 Noted to have new anemia with a hgb of 10 Labs with no signs of  hypercalcemia or renal failure. No overt focal new bone pains. Bm Bx shows 21% abnormal plasma cells. Rpt Bone Survey on 05/12/2016 -- showed no evidence of lytic lesions.  02/28/18 Bone Survey which revealed New T7 compression deformity. Multiple chronic compression deformities are noted. No definitive lytic lesions are identified.   03/25/18 PET/CT revealed No evidence of active multiple myeloma within the skeleton. 2. No soft tissue mass to suggest plasmacytoma. 3. Aortic Atherosclerosis and Emphysema. 03/21/18 BM Bx revealed 30% atypical plasma cells   PLAN: -Will hold Calcitonin spray -Continue follow up with Dr. Carlean Purl in GI  -Continue optimizing food intake and calcium rich foods -Continue Vitamin D replacement and a Vitamin B complex  -  Discussed pt labwork today, 06/17/18; transaminases have normalized and Alk Phos has improved to 195, blood counts and chemistries are otherwise stable  -The pt's initial M Protein was at 3.7, now at 0.2 as of 06/02/18, indicating a very good partial response -Discussed the option to consider switching from Velcade to maintenance Revlimid or Ninlaro and discussed the common side effects of each, with some  preference to dose reduced Ninlaro.  -The pt prefers to complete Velcade as scheduled today, and then [facility]  -The pt has no prohibitive toxicities from continuing 88m/m2 Velcade and 282mDexamethasone at this time.   -Continue Acyclovir -Continue Xgeva every 4 weeks  -Will see the pt back in 4 weeks     #2 liver cirrhosis with known varices. Increased bilirubin levels. Plan -Counseled on absolute alcohol cessation. -continue f/u with PCP  #3 osteoporosis with vertebral compression fractures.  Plan  -continue aggressive vitamin D replacement . -Pt has had full dental extraction and has recovered well with dental clearance to begin Xgeva.  -continue XgMarchelle Folks -please cancel future Velcade appointments after today -continue XgMarchelle FolksRTC with Dr KaIrene Limboith labs in 4 weeks -[facility]  All of the patients and her female partners questions were answered to their apparent satisfaction. The patient knows to call the clinic with any problems, questions or concerns.  The total time spent in the appt was 25 minutes and more than 50% was on counseling and direct patient cares.     GaSullivan LoneD MSMedfordAHIVMS SCMain Line Surgery Center LLCTCleveland-Wade Park Va Medical Centerematology/Oncology Physician CoOakbend Medical [facility] - [address](Office):       [phone] cell):  [phone]):           [phone], ScBaldwin Jamaicaam acting as a scribe for Dr. GaSullivan Lone  .I have reviewed the above documentation for accuracy and completeness, and I agree with the above. .GBrunetta GeneraD

## 2018-06-17 ENCOUNTER — Other Ambulatory Visit: Payer: Self-pay | Admitting: Hematology

## 2018-06-17 ENCOUNTER — Inpatient Hospital Stay: Payer: Medicare Other

## 2018-06-17 ENCOUNTER — Inpatient Hospital Stay (HOSPITAL_BASED_OUTPATIENT_CLINIC_OR_DEPARTMENT_OTHER): Payer: Medicare Other | Admitting: Hematology

## 2018-06-17 ENCOUNTER — Telehealth: Payer: Self-pay | Admitting: Hematology

## 2018-06-17 VITALS — BP 112/81 | HR 114 | Temp 98.3°F | Resp 18 | Ht 60.0 in | Wt 77.0 lb

## 2018-06-17 DIAGNOSIS — R748 Abnormal levels of other serum enzymes: Secondary | ICD-10-CM

## 2018-06-17 DIAGNOSIS — Z5111 Encounter for antineoplastic chemotherapy: Secondary | ICD-10-CM | POA: Diagnosis not present

## 2018-06-17 DIAGNOSIS — M1990 Unspecified osteoarthritis, unspecified site: Secondary | ICD-10-CM | POA: Diagnosis not present

## 2018-06-17 DIAGNOSIS — Z87891 Personal history of nicotine dependence: Secondary | ICD-10-CM

## 2018-06-17 DIAGNOSIS — C9000 Multiple myeloma not having achieved remission: Secondary | ICD-10-CM | POA: Diagnosis not present

## 2018-06-17 DIAGNOSIS — Z79899 Other long term (current) drug therapy: Secondary | ICD-10-CM

## 2018-06-17 DIAGNOSIS — I700 Atherosclerosis of aorta: Secondary | ICD-10-CM | POA: Diagnosis not present

## 2018-06-17 DIAGNOSIS — R202 Paresthesia of skin: Secondary | ICD-10-CM

## 2018-06-17 DIAGNOSIS — R200 Anesthesia of skin: Secondary | ICD-10-CM

## 2018-06-17 DIAGNOSIS — M545 Low back pain: Secondary | ICD-10-CM

## 2018-06-17 DIAGNOSIS — X58XXXS Exposure to other specified factors, sequela: Secondary | ICD-10-CM | POA: Diagnosis not present

## 2018-06-17 DIAGNOSIS — M25552 Pain in left hip: Secondary | ICD-10-CM

## 2018-06-17 DIAGNOSIS — Z7189 Other specified counseling: Secondary | ICD-10-CM

## 2018-06-17 DIAGNOSIS — K7031 Alcoholic cirrhosis of liver with ascites: Secondary | ICD-10-CM | POA: Diagnosis not present

## 2018-06-17 DIAGNOSIS — M8008XS Age-related osteoporosis with current pathological fracture, vertebra(e), sequela: Secondary | ICD-10-CM | POA: Diagnosis not present

## 2018-06-17 DIAGNOSIS — J439 Emphysema, unspecified: Secondary | ICD-10-CM

## 2018-06-17 LAB — CBC WITH DIFFERENTIAL/PLATELET
ABS IMMATURE GRANULOCYTES: 0.01 10*3/uL (ref 0.00–0.07)
BASOS ABS: 0.1 10*3/uL (ref 0.0–0.1)
Basophils Relative: 2 %
EOS ABS: 0.2 10*3/uL (ref 0.0–0.5)
Eosinophils Relative: 3 %
HEMATOCRIT: 42.9 % (ref 36.0–46.0)
HEMOGLOBIN: 15 g/dL (ref 12.0–15.0)
IMMATURE GRANULOCYTES: 0 %
LYMPHS ABS: 0.8 10*3/uL (ref 0.7–4.0)
LYMPHS PCT: 15 %
MCH: 36.1 pg — ABNORMAL HIGH (ref 26.0–34.0)
MCHC: 35 g/dL (ref 30.0–36.0)
MCV: 103.4 fL — AB (ref 80.0–100.0)
MONOS PCT: 12 %
Monocytes Absolute: 0.7 10*3/uL (ref 0.1–1.0)
NEUTROS ABS: 3.6 10*3/uL (ref 1.7–7.7)
NEUTROS PCT: 68 %
NRBC: 0 % (ref 0.0–0.2)
Platelets: 231 10*3/uL (ref 150–400)
RBC: 4.15 MIL/uL (ref 3.87–5.11)
RDW: 13.2 % (ref 11.5–15.5)
WBC: 5.3 10*3/uL (ref 4.0–10.5)

## 2018-06-17 LAB — CMP (CANCER CENTER ONLY)
ALBUMIN: 3.4 g/dL — AB (ref 3.5–5.0)
ALK PHOS: 195 U/L — AB (ref 38–126)
ALT: 15 U/L (ref 0–44)
ANION GAP: 10 (ref 5–15)
AST: 35 U/L (ref 15–41)
BUN: 7 mg/dL — ABNORMAL LOW (ref 8–23)
CHLORIDE: 99 mmol/L (ref 98–111)
CO2: 27 mmol/L (ref 22–32)
Calcium: 9.5 mg/dL (ref 8.9–10.3)
Creatinine: 0.66 mg/dL (ref 0.44–1.00)
GFR, Estimated: >60 mL/min (ref >60)
GLUCOSE: 105 mg/dL — AB (ref 70–99)
POTASSIUM: 4.5 mmol/L (ref 3.5–5.1)
SODIUM: 136 mmol/L (ref 135–145)
Total Bilirubin: 1.1 mg/dL (ref 0.3–1.2)
Total Protein: 6.3 g/dL — ABNORMAL LOW (ref 6.5–8.1)

## 2018-06-17 MED ORDER — BORTEZOMIB CHEMO SQ INJECTION 3.5 MG (2.5MG/ML)
1.0000 mg/m2 | Freq: Once | INTRAMUSCULAR | Status: AC
  Administered 2018-06-17: 1.25 mg via SUBCUTANEOUS
  Filled 2018-06-17: qty 0.5

## 2018-06-17 MED ORDER — PROCHLORPERAZINE MALEATE 10 MG PO TABS
10.0000 mg | ORAL_TABLET | Freq: Once | ORAL | Status: AC
  Administered 2018-06-17: 10 mg via ORAL

## 2018-06-17 MED ORDER — PROCHLORPERAZINE MALEATE 10 MG PO TABS
ORAL_TABLET | ORAL | Status: AC
  Filled 2018-06-17: qty 1

## 2018-06-17 MED ORDER — DEXAMETHASONE 4 MG PO TABS
40.0000 mg | ORAL_TABLET | Freq: Once | ORAL | Status: AC
  Administered 2018-06-17: 40 mg via ORAL

## 2018-06-17 MED ORDER — DEXAMETHASONE 4 MG PO TABS
ORAL_TABLET | ORAL | Status: AC
  Filled 2018-06-17: qty 10

## 2018-06-17 MED ORDER — IXAZOMIB CITRATE 3 MG PO CAPS: 3.0000 mg | ORAL_CAPSULE | ORAL | 2 refills | Status: DC

## 2018-06-17 NOTE — Telephone Encounter (Signed)
Gave pt avs and calendar  °

## 2018-06-17 NOTE — Progress Notes (Unsigned)
nnlaro

## 2018-06-17 NOTE — Patient Instructions (Signed)
[facility] Discharge Instructions for Patients Receiving Chemotherapy  Today you received the following chemotherapy agents: Bortezomib (Velcade)  To help prevent nausea and vomiting after your treatment, we encourage you to take your nausea medication as directed.    If you develop nausea and vomiting that is not controlled by your nausea medication, call the clinic.   BELOW ARE SYMPTOMS THAT SHOULD BE REPORTED IMMEDIATELY:  *FEVER GREATER THAN 100.5 F  *CHILLS WITH OR WITHOUT FEVER  NAUSEA AND VOMITING THAT IS NOT CONTROLLED WITH YOUR NAUSEA MEDICATION  *UNUSUAL SHORTNESS OF BREATH  *UNUSUAL BRUISING OR BLEEDING  TENDERNESS IN MOUTH AND THROAT WITH OR WITHOUT PRESENCE OF ULCERS  *URINARY PROBLEMS  *BOWEL PROBLEMS  UNUSUAL RASH Items with * indicate a potential emergency and should be followed up as soon as possible.  Feel free to call the clinic should you have any questions or concerns. The clinic phone number is [phone].  Please show the CHEMO ALERT CARD at check-in to the Emergency Department and triage nurse.   

## 2018-06-20 ENCOUNTER — Telehealth: Payer: Self-pay

## 2018-06-20 ENCOUNTER — Telehealth: Payer: Self-pay | Admitting: Pharmacist

## 2018-06-20 DIAGNOSIS — K7030 Alcoholic cirrhosis of liver without ascites: Secondary | ICD-10-CM | POA: Diagnosis not present

## 2018-06-20 DIAGNOSIS — M6281 Muscle weakness (generalized): Secondary | ICD-10-CM | POA: Diagnosis not present

## 2018-06-20 DIAGNOSIS — C9000 Multiple myeloma not having achieved remission: Secondary | ICD-10-CM | POA: Diagnosis not present

## 2018-06-20 DIAGNOSIS — F1011 Alcohol abuse, in remission: Secondary | ICD-10-CM | POA: Diagnosis not present

## 2018-06-20 DIAGNOSIS — M8008XD Age-related osteoporosis with current pathological fracture, vertebra(e), subsequent encounter for fracture with routine healing: Secondary | ICD-10-CM | POA: Diagnosis not present

## 2018-06-20 DIAGNOSIS — J439 Emphysema, unspecified: Secondary | ICD-10-CM | POA: Diagnosis not present

## 2018-06-20 LAB — KAPPA/LAMBDA LIGHT CHAINS
KAPPA, LAMDA LIGHT CHAIN RATIO: 1.04 (ref 0.26–1.65)
Kappa free light chain: 11.2 mg/L (ref 3.3–19.4)
LAMDA FREE LIGHT CHAINS: 10.8 mg/L (ref 5.7–26.3)

## 2018-06-20 NOTE — Telephone Encounter (Signed)
Oral Oncology Patient Advocate Encounter  Received notification from [employer] that prior authorization for Cassidy Barrett is required.  PA submitted on CoverMyMeds Key [id number] Status is pending  Oral Oncology Clinic will continue to follow.  [place] Patient [place] Phone [phone] Fax [phone]

## 2018-06-20 NOTE — Telephone Encounter (Signed)
Oral Oncology Patient Advocate Encounter  Prior Authorization for Cassidy Barrett has been approved.    PA# [id number] Effective dates: 06/20/18 through 07/27/19  Oral Oncology Clinic will continue to follow.   [name] Patient [place] Phone [phone] Fax [phone]

## 2018-06-20 NOTE — Telephone Encounter (Signed)
Oral Oncology Pharmacist Encounter  Received new prescription for Ninlaro (ixazomib) for the maintenance treatment of multiple myeloma with very good partial response, planned duration until disease progression or unacceptable toxicity.  Patient had been on treatment with bortezomib and dexamethasone weekly. Last bortezomib injection : 06/17/18 Now changing to bortezomib every 2 weeks or Ninlaro. Next bortezomib injection scheduled for 07/01/18  Labs from 05/28/18 assessed, OK for treatment.  SCr=0.66, round to 1.0 for age > 48, est CrCl (wt=34.9kg, age =70) ~ 25 mL/min Noted dose reduction of Ninlaro to '3mg'$  weekly, for 3 weeks on, 1 week off, as recommended by manufacturer for CrCl < 30 mL/min  Current medication list in Epic reviewed, no DDIs with Ninlaro identified.  Prescription will be sent to appropriate specialty pharmacy for dispensing once insurance authorization is obtained.  Oral Oncology Clinic will continue to follow for insurance authorization, copayment issues, initial counseling and start date.  Johny Drilling, PharmD, BCPS, BCOP  06/20/2018 9:52 AM Oral Oncology Clinic [phone]

## 2018-06-21 ENCOUNTER — Other Ambulatory Visit: Payer: Self-pay | Admitting: Nurse Practitioner

## 2018-06-21 LAB — MULTIPLE MYELOMA PANEL, SERUM
ALPHA2 GLOB SERPL ELPH-MCNC: 0.7 g/dL (ref 0.4–1.0)
Albumin SerPl Elph-Mcnc: 3.4 g/dL (ref 2.9–4.4)
Albumin/Glob SerPl: 1.5 (ref 0.7–1.7)
Alpha 1: 0.3 g/dL (ref 0.0–0.4)
B-Globulin SerPl Elph-Mcnc: 0.8 g/dL (ref 0.7–1.3)
Gamma Glob SerPl Elph-Mcnc: 0.6 g/dL (ref 0.4–1.8)
Globulin, Total: 2.4 g/dL (ref 2.2–3.9)
IGA: 187 mg/dL (ref 87–352)
IGG (IMMUNOGLOBIN G), SERUM: 824 mg/dL (ref 700–1600)
IGM (IMMUNOGLOBULIN M), SRM: 18 mg/dL — AB (ref 26–217)
TOTAL PROTEIN ELP: 5.8 g/dL — AB (ref 6.0–8.5)

## 2018-06-21 MED ORDER — IXAZOMIB CITRATE 3 MG PO CAPS: ORAL_CAPSULE | ORAL | 2 refills | Status: DC

## 2018-06-21 MED ORDER — IXAZOMIB CITRATE 3 MG PO CAPS: 3.0000 mg | ORAL_CAPSULE | ORAL | 2 refills | Status: DC

## 2018-06-21 NOTE — Telephone Encounter (Signed)
Oral Chemotherapy Pharmacist Encounter   I spoke with patient for overview of: Ninlaro (ixazomib) for the maintenance treatment of multiple myeloma with very good partial response, planned duration until disease progression or unacceptable toxicity.  Counseled patient on administration, dosing, side effects, monitoring, drug-food interactions, safe handling, storage, and disposal.  Patient will take Ninlaro '3mg'$  capsules, 1 capsule by mouth once weekly. Take on an empty stomach, 1 hour before or 2 hours after a meal. Take on days 1, 8, and 15 of each 28 day cycle.  Ninlaro start date: 07/01/2018  Adverse effects of Ninlaro include but are not limited to: peripheral edema, constipation, nausea, vomiting, decreased blood counts, and peripheral neuropathy.   Patient has anti-emetic on hand and knows to take it if nausea develops.    Reviewed with patient importance of keeping a medication schedule and plan for any missed doses.  Cassidy Barrett voiced understanding and appreciation.   All questions answered. Medication reconciliation performed and medication/allergy list updated.  Confirmed with patient they continue on acyclovir for VZV prophylaxis.  Patient informed that insurance authorization has been approved. Ninlaro prescription must be filled through [facility] per insurance requirement. Prescription has been sent to this dispensing pharmacy today.  Patient informed that pharmacy will be reaching out to her to deliver copayment information and to schedule for shipment of her Ninlaro. Patient instructed to alert the pharmacy if she is unable to pay any co-pay quoted, and the pharmacy will assist and signing patient up for copayment assistance grant to cover any out-of-pocket cost for the Ninlaro.  Patient knows to call the office with questions or concerns. Oral Oncology Clinic will continue to follow.  Johny Drilling, PharmD, BCPS, BCOP  06/21/2018 3:38 PM Oral Oncology  Clinic [phone]

## 2018-06-22 ENCOUNTER — Telehealth: Payer: Self-pay

## 2018-06-22 DIAGNOSIS — K7030 Alcoholic cirrhosis of liver without ascites: Secondary | ICD-10-CM | POA: Diagnosis not present

## 2018-06-22 DIAGNOSIS — M6281 Muscle weakness (generalized): Secondary | ICD-10-CM | POA: Diagnosis not present

## 2018-06-22 DIAGNOSIS — F1011 Alcohol abuse, in remission: Secondary | ICD-10-CM | POA: Diagnosis not present

## 2018-06-22 DIAGNOSIS — M8008XD Age-related osteoporosis with current pathological fracture, vertebra(e), subsequent encounter for fracture with routine healing: Secondary | ICD-10-CM | POA: Diagnosis not present

## 2018-06-22 DIAGNOSIS — J439 Emphysema, unspecified: Secondary | ICD-10-CM | POA: Diagnosis not present

## 2018-06-22 DIAGNOSIS — C9000 Multiple myeloma not having achieved remission: Secondary | ICD-10-CM | POA: Diagnosis not present

## 2018-06-22 NOTE — Telephone Encounter (Signed)
Oral Oncology Patient Advocate Encounter  I emailed our direct contact with [employer] to follow up on the status of Ninlaro. They do not have active coverage for the patient on file. I sent the insurance information we have and I will follow up again later today.  [place] Patient [place] Phone [phone] Fax [phone]

## 2018-06-22 NOTE — Telephone Encounter (Signed)
Oral Oncology Patient Advocate Encounter  I followed up with [employer] and the copay is $1019.74. I was successful at securing a grant with [employer] for $10,000. This will keep the out of pocket expense for Ninlaro at $0. The grant information is as follows and has been shared with [employer].  Approval dates: 05/23/2018-05/23/2019 ID: [id number] Group: [id number] BIN: [id number] PCN: [id number]  I called the patient and gave her the good news. She verbalized understanding and great appreciation.  [place] Patient [place] Phone [phone] Fax [phone]

## 2018-06-24 ENCOUNTER — Ambulatory Visit: Payer: Self-pay

## 2018-06-24 ENCOUNTER — Other Ambulatory Visit: Payer: Self-pay

## 2018-06-24 NOTE — Telephone Encounter (Signed)
Cassidy Barrett saw her last, said she had decreased amount of ETOH so may not need folic acid indefinitely. Peter Congo please refill with 3 additional refills. Thanks

## 2018-06-27 DIAGNOSIS — F1011 Alcohol abuse, in remission: Secondary | ICD-10-CM | POA: Diagnosis not present

## 2018-06-27 DIAGNOSIS — J439 Emphysema, unspecified: Secondary | ICD-10-CM | POA: Diagnosis not present

## 2018-06-27 DIAGNOSIS — M6281 Muscle weakness (generalized): Secondary | ICD-10-CM | POA: Diagnosis not present

## 2018-06-27 DIAGNOSIS — K7030 Alcoholic cirrhosis of liver without ascites: Secondary | ICD-10-CM | POA: Diagnosis not present

## 2018-06-27 DIAGNOSIS — C9000 Multiple myeloma not having achieved remission: Secondary | ICD-10-CM | POA: Diagnosis not present

## 2018-06-27 DIAGNOSIS — M8008XD Age-related osteoporosis with current pathological fracture, vertebra(e), subsequent encounter for fracture with routine healing: Secondary | ICD-10-CM | POA: Diagnosis not present

## 2018-06-27 NOTE — Telephone Encounter (Signed)
Oral Oncology Pharmacist Encounter  Received call from patient with additional questions about Ninlaro start. Patient wanted to clarify Ninlaro administration. We reviewed that patient will take her Ninlaro 3 mg capsules, 1 capsule taken by mouth once weekly for 3 weeks on 1 week off. Patient will take her Ninlaro on an empty stomach, 1 hour before or 2 hours after food.  Patient states that she drinks a glass of full fat buttermilk every morning and wanted to know if this was considered food. Patient counseled to take her Ninlaro at least 2 hours after her buttermilk.  Patient also stated that she had not yet heard from the dispensing pharmacy and was to start her Ninlaro on 07/01/2018. We will follow-up with [facility] as to when this medication will be dispensed.  Patient also with questions about whether or not she can receive her pneumonia vaccine when she comes in for her Xgeva shot on 07/01/2018. She states she has already left a message for Dr. Grier Mitts nurse with this question. Patient instructed to wait until she receives call back from Dr. Grier Mitts nurse with this answer.  Patient expressed understanding and appreciation. She knows to call the office with any additional questions or concerns.  Johny Drilling, PharmD, BCPS, BCOP  06/27/2018 2:02 PM Oral Oncology Clinic [phone]

## 2018-06-28 ENCOUNTER — Telehealth: Payer: Self-pay | Admitting: *Deleted

## 2018-06-28 ENCOUNTER — Other Ambulatory Visit: Payer: Self-pay | Admitting: Hematology

## 2018-06-28 DIAGNOSIS — Z23 Encounter for immunization: Secondary | ICD-10-CM

## 2018-06-28 DIAGNOSIS — C9000 Multiple myeloma not having achieved remission: Secondary | ICD-10-CM

## 2018-06-28 MED ORDER — PNEUMOCOCCAL VAC POLYVALENT 25 MCG/0.5ML IJ INJ: 0.5000 mL | INJECTION | Freq: Once | INTRAMUSCULAR | Status: DC

## 2018-06-28 NOTE — Telephone Encounter (Signed)
Patient called to request pneumonia shot. States she will be at [facility] for Xgeva injection on Friday 12/6 and asked if she could receive it then. Per Dr. Irene Limbo, she can receive both injections Friday. Patient verbalized understanding.

## 2018-06-30 ENCOUNTER — Other Ambulatory Visit: Payer: Self-pay

## 2018-06-30 ENCOUNTER — Ambulatory Visit: Payer: Self-pay

## 2018-06-30 ENCOUNTER — Telehealth: Payer: Self-pay | Admitting: *Deleted

## 2018-06-30 NOTE — Telephone Encounter (Signed)
Contacted by Eliezer Lofts in Pharmacy [phone]. Requested approval to use labs resulted 06/17/18 for criteria for xygeva injection 07/01/18. Per Dr. Irene Limbo may use labs from 06/17/18. Notified Jenna of Dr. Irene Limbo decision.

## 2018-07-01 ENCOUNTER — Inpatient Hospital Stay: Payer: Medicare Other | Attending: Hematology

## 2018-07-01 VITALS — BP 123/80 | HR 121

## 2018-07-01 DIAGNOSIS — Z23 Encounter for immunization: Secondary | ICD-10-CM | POA: Insufficient documentation

## 2018-07-01 DIAGNOSIS — Z7189 Other specified counseling: Secondary | ICD-10-CM

## 2018-07-01 DIAGNOSIS — C9000 Multiple myeloma not having achieved remission: Secondary | ICD-10-CM

## 2018-07-01 MED ORDER — DENOSUMAB 120 MG/1.7ML ~~LOC~~ SOLN
120.0000 mg | Freq: Once | SUBCUTANEOUS | Status: AC
  Administered 2018-07-01: 120 mg via SUBCUTANEOUS

## 2018-07-01 MED ORDER — PNEUMOCOCCAL VAC POLYVALENT 25 MCG/0.5ML IJ INJ
0.5000 mL | INJECTION | Freq: Once | INTRAMUSCULAR | Status: AC
  Administered 2018-07-01: 0.5 mL via INTRAMUSCULAR
  Filled 2018-07-01: qty 0.5

## 2018-07-04 ENCOUNTER — Other Ambulatory Visit: Payer: Self-pay | Admitting: Internal Medicine

## 2018-07-05 NOTE — Telephone Encounter (Signed)
Oral Oncology Patient Advocate Encounter  Confirmed with [employer] that Cassidy Barrett will be delivered to the patients home today, 07/05/18 with a $0 copay using the grant.   [place] Patient [place] Phone [phone] Fax [phone]

## 2018-07-06 ENCOUNTER — Telehealth: Payer: Self-pay | Admitting: Pharmacist

## 2018-07-06 DIAGNOSIS — K7030 Alcoholic cirrhosis of liver without ascites: Secondary | ICD-10-CM | POA: Diagnosis not present

## 2018-07-06 DIAGNOSIS — M8008XD Age-related osteoporosis with current pathological fracture, vertebra(e), subsequent encounter for fracture with routine healing: Secondary | ICD-10-CM | POA: Diagnosis not present

## 2018-07-06 DIAGNOSIS — F1011 Alcohol abuse, in remission: Secondary | ICD-10-CM | POA: Diagnosis not present

## 2018-07-06 DIAGNOSIS — J439 Emphysema, unspecified: Secondary | ICD-10-CM | POA: Diagnosis not present

## 2018-07-06 DIAGNOSIS — M6281 Muscle weakness (generalized): Secondary | ICD-10-CM | POA: Diagnosis not present

## 2018-07-06 DIAGNOSIS — C9000 Multiple myeloma not having achieved remission: Secondary | ICD-10-CM | POA: Diagnosis not present

## 2018-07-06 NOTE — Telephone Encounter (Signed)
Oral Chemotherapy Pharmacist Encounter  Follow-Up Form  Spoke with patient today to follow up regarding patient's oral chemotherapy medication: Ninlaro (ixazomib)for the maintenancetreatment of multiple myeloma with very good partial response, planned durationuntil disease progression or unacceptable toxicity.  Original Start date of oral chemotherapy: 07/06/2018  Patient received her first fill of Ninlaro yesterday and started taking it today.  She states 2 hours after her first dose, taken at 10 AM, she started to experience feelings of nausea. Nausea was managed with a dose of Zofran.  Patient instructed to take a dose of Zofran 30 to 60 minutes prior to next Ninlaro administration in order to prevent nausea or vomiting from occurring. Patient in agreement with above plan.  Patient knows to call the office with questions or concerns. Oral Oncology Clinic will continue to follow.  Johny Drilling, PharmD, BCPS, BCOP  07/06/2018 3:02 PM Oral Oncology Clinic [phone]

## 2018-07-07 ENCOUNTER — Telehealth: Payer: Self-pay | Admitting: *Deleted

## 2018-07-07 ENCOUNTER — Other Ambulatory Visit: Payer: Self-pay

## 2018-07-07 ENCOUNTER — Ambulatory Visit: Payer: Self-pay

## 2018-07-07 NOTE — Telephone Encounter (Signed)
Called patient and advised per Dr. Irene Limbo: Take Zofran 8 mg tablet 1 hour before next dose of Ninlaro next week. Can take tylenol  650 mg (2 tablets) for headache and temperature. Can also take with Zofran next week prior to next dose of Ninlaro. Patient verbalized understanding and will call office tomorrow if no improvement.

## 2018-07-07 NOTE — Telephone Encounter (Signed)
Patient called back to state her temperature is 99.6.

## 2018-07-07 NOTE — Telephone Encounter (Signed)
Patient called. After taking Ninlaro yesterday, had nausea and diarrhea and headache. Took pepto bismol and imodium, symptoms reduced. Feels better, but still has headache and is feeling a little "off" today. Wants to know about medcation she can take for headache.

## 2018-07-12 ENCOUNTER — Telehealth: Payer: Self-pay | Admitting: *Deleted

## 2018-07-12 DIAGNOSIS — M6281 Muscle weakness (generalized): Secondary | ICD-10-CM | POA: Diagnosis not present

## 2018-07-12 DIAGNOSIS — K7030 Alcoholic cirrhosis of liver without ascites: Secondary | ICD-10-CM | POA: Diagnosis not present

## 2018-07-12 DIAGNOSIS — F1011 Alcohol abuse, in remission: Secondary | ICD-10-CM | POA: Diagnosis not present

## 2018-07-12 DIAGNOSIS — J439 Emphysema, unspecified: Secondary | ICD-10-CM | POA: Diagnosis not present

## 2018-07-12 DIAGNOSIS — C9000 Multiple myeloma not having achieved remission: Secondary | ICD-10-CM | POA: Diagnosis not present

## 2018-07-12 DIAGNOSIS — M8008XD Age-related osteoporosis with current pathological fracture, vertebra(e), subsequent encounter for fracture with routine healing: Secondary | ICD-10-CM | POA: Diagnosis not present

## 2018-07-12 NOTE — Telephone Encounter (Signed)
Patient called to state that she's continued to have GI symptoms since taking the Ninlaro last week. It started with diarrhea. She took an Imodium on Saturday and the diarrhea stopped. She vomited on Sunday and took Zofran for nausea. Has not had any further diarrhea or vomiting. Has indigestion and poor appetite. Has not had a BM since taking the Imodium on Saturday (3 days). Started drinking prune juice today. Encouraged patient to drink plenty of fluids. She states she sees Dr. Irene Limbo on Thursday and wanted him to know about the side effects. States she is getting ready to take another dose of Ninlaro tomorrow and is dreading it.

## 2018-07-14 ENCOUNTER — Inpatient Hospital Stay: Payer: Medicare Other | Admitting: Hematology

## 2018-07-14 ENCOUNTER — Inpatient Hospital Stay: Payer: Medicare Other

## 2018-07-14 ENCOUNTER — Telehealth: Payer: Self-pay | Admitting: *Deleted

## 2018-07-14 ENCOUNTER — Ambulatory Visit: Payer: Self-pay

## 2018-07-14 NOTE — Telephone Encounter (Signed)
Contacted by spouse [name]. Patient took second dose of Ninlaro yesterday. Started having diarrhea last night, has not slept much. Want to reschedule appt for today until later due to patient feeling so bad.  Per Dr. Irene Limbo, inform patient/spouse: Ok to miss appt today. Hold Ninlaro at this time, don't take further doses and patient can reschedule appt to be seen until first week of new year.  Information given to spouse as patient still asleep in bed. Advised that scheduling will call them  Ms. Cassidy Barrett verbalized understanding.

## 2018-07-15 ENCOUNTER — Telehealth: Payer: Self-pay | Admitting: Hematology

## 2018-07-15 NOTE — Telephone Encounter (Signed)
R/s appt per 12/19 sch message - pt is aware of appt date and time

## 2018-07-18 ENCOUNTER — Encounter: Payer: Self-pay | Admitting: Hematology

## 2018-07-18 DIAGNOSIS — M546 Pain in thoracic spine: Secondary | ICD-10-CM | POA: Diagnosis not present

## 2018-07-18 DIAGNOSIS — M545 Low back pain: Secondary | ICD-10-CM | POA: Diagnosis not present

## 2018-07-21 ENCOUNTER — Other Ambulatory Visit: Payer: Self-pay | Admitting: Sports Medicine

## 2018-07-21 DIAGNOSIS — M545 Low back pain, unspecified: Secondary | ICD-10-CM

## 2018-07-22 ENCOUNTER — Telehealth: Payer: Self-pay | Admitting: *Deleted

## 2018-07-22 NOTE — Telephone Encounter (Signed)
Patient left VM: She wanted Dr. Irene Limbo to know: Pulled back out last week. Orthopedist scheduled CT scan  Monday 12/30 at [place].

## 2018-07-25 ENCOUNTER — Ambulatory Visit
Admission: RE | Admit: 2018-07-25 | Discharge: 2018-07-25 | Disposition: A | Discharge status: Home / Self Care | Payer: Medicare Other | Source: Ambulatory Visit | Attending: Sports Medicine | Admitting: Sports Medicine

## 2018-07-25 DIAGNOSIS — M545 Low back pain, unspecified: Secondary | ICD-10-CM

## 2018-07-25 DIAGNOSIS — S32020A Wedge compression fracture of second lumbar vertebra, initial encounter for closed fracture: Secondary | ICD-10-CM | POA: Diagnosis not present

## 2018-08-03 ENCOUNTER — Telehealth: Payer: Self-pay | Admitting: *Deleted

## 2018-08-03 DIAGNOSIS — S32050D Wedge compression fracture of fifth lumbar vertebra, subsequent encounter for fracture with routine healing: Secondary | ICD-10-CM | POA: Diagnosis not present

## 2018-08-03 DIAGNOSIS — M810 Age-related osteoporosis without current pathological fracture: Secondary | ICD-10-CM | POA: Diagnosis not present

## 2018-08-03 DIAGNOSIS — M545 Low back pain: Secondary | ICD-10-CM | POA: Diagnosis not present

## 2018-08-03 NOTE — Progress Notes (Signed)
[facility] Kitchen    HEMATOLOGY/ONCOLOGY CLINIC NOTE  Date of Service: 08/04/18     Patient Care Team: Lanice Shirts, MD as PCP - General (Internal Medicine) Jeralene Peters, DDS as Dietitian (Periodontics)  Orthopedics - Dr. Layne Benton from [facility]   CHIEF COMPLAINTS/PURPOSE OF CONSULTATION:   F/u for continued mx of multiple myeloma  HISTORY OF PRESENTING ILLNESS:   Cassidy Barrett is a wonderful 73 y.o. female who has been referred to [place] by Dr Wandra Feinstein for evaluation and management of possible plasma cell dyscrasia.  Patient has a history of liver cirrhosis with ascites, alcohol abuse, emphysema, macrocytic anemia, osteoporosis and was seen by orthopedics for spinal vertebral compression fractures in the setting of osteoporosis.  She was noted to have multiple significant lab abnormalities including an elevated alkaline phosphatase and high total protein level.  Chronic appealing L1-L2 compression fractures  Patient subsequently had a bone scan to evaluate these compression fractures on 09/26/2015.  This showed mild increased activity within the superior endplates of L3, L4 and L5 likely representing subacute fractures. Increased activity within the T2 vertebral body compatible with known compression fracture. No evidence of suggest bony metastatic disease.  As a part of her workup she also had an SPEP done which shows 3 monoclonal protein bands for a total M protein of 2.3 g/dL. On IFE this is noted to be monoclonal IgA kappa protein. Her sedimentation rate was also elevated at 73.  She was referred to [place] for further evaluation to rule out multiple myeloma. Patient notes her mid and lower back pain related to her compression fractures. Also notes some left hip pain. Her labs from January 2017 did not show any anemia, hypercalcemia or renal failure.  She notes that she did have a colonoscopy in 2015 which demonstrated a 4.5 cm rectal polyp which was  removed piecemeal. Pathology showed a tubulovillous adenoma. Patient was also noted to have significant diverticulosis and was not recommended routine colonoscopies. Given her CT scan showing concern of rectal mass she was referred back to Dr. Henrene Pastor at [facility] for consideration of a sigmoidoscopy/colonoscopy. Patient notes no overt rectal bleeding. No obstructive symptoms. No change in bowel habits. No new abdominal pain. She also needs to connect with gi for management of her liver cirrhosis and related issues.  INTERVAL HISTORY:  Cassidy Barrett is here for her scheduled followup for recently diagnosed myeloma. The patient's last visit with [place] was on 06/17/18. She is accompanied today by her partner. The pt reports that she is doing well overall.   The pt took 2 doses of Ninlaro, and her last dose was the week of 07/13/18. She developed nausea, diarrhea, blurred vision, and intermittent constipation, noting that Ninlaro "totally derailed" her. The pt took Tramadol for her back pain and developed weakened appetite, stopped Tramadol, and began a lidocaine patch and Voltaren cream as well. She notes that she doesn't "feel like eating anything." She ate one milkshake yesterday, and has only had a carnation breakfast today. The pt also cites indigestion and acid reflux, and has used pepto bismol and Zofran.  She continues on Lasix and spironolactone. She denies any leg swelling.  Of note since the patient's last visit, pt has had a CT Lumbar Spine completed on 07/25/18 with results revealing Age-indeterminate mild mid L2 vertebral body compression fracture which is new compared with 08/13/2017 with less than 10% height loss. 2. Chronic T10, T11, T12, L1, L4 and L5 vertebral body compression fractures.  3.  Aortic Atherosclerosis.  Lab results today (08/04/18) of CBC w/diff and CMP is as follows: all values are WNL except for HGB at 16.2, MCH at 35.1, MCHC at 36.2, Monocytes abs at 1.4k, Sodium at 129,  Chloride at 92, Glucose at 102, Total Protein at 6.2, Alk Phos at 217, Total bilirubin at 1.3. 08/04/18 MMP and SFLC are pending  On review of systems, pt reports low appetite, low energy levels, stable back pain, recent NVD, and denies leg swelling, abdominal pain, and any other symptoms.    MEDICAL HISTORY:  Past Medical History:  Diagnosis Date  . Alcohol abuse   . Arthritis   . Ascites   . Atherosclerosis of aorta ([place])   . Cholelithiasis   . Cirrhosis ([place])   . Complication of anesthesia    Difficult time waking up after having biospy with colonoscopy  . 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 Compression fracture of lumbar vertebra ([facility])     L1 and L2  . Emphysema of lung ([place])   . History of multiple myeloma    smoldering  . Hx of adenomatous polyp of rectum 05/21/2014  . Hyponatremia   . Macrocytic anemia   . 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 Osteoporosis   . Pneumonia 04/2014  . Tobacco abuse   . Tubular adenoma   . Vertebral compression fracture ([facility])    ?? 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  SURGICAL HISTORY: Past Surgical History:  Procedure Laterality Date  . COLONOSCOPY N/A 05/24/2014   Procedure: COLONOSCOPY;  Surgeon: Gatha Mayer, MD;  Location: [facility];  Service: Endoscopy;  Laterality: N/A;  MAC if possible ultraslim colonoscope and gastroscope needed  . FLEXIBLE SIGMOIDOSCOPY N/A 05/21/2014   Procedure: FLEXIBLE SIGMOIDOSCOPY;  Surgeon: Gatha Mayer, MD;  Location: [facility];  Service: Endoscopy;  Laterality: N/A;  . KYPHOPLASTY N/A 12/12/2014   Procedure: KYPHOPLASTY;  Surgeon: Phylliss Bob, MD;  Location: [facility];  Service: Orthopedics;  Laterality: N/A;  T10 kyphoplasty  . MANDIBLE FRACTURE SURGERY     X 2  . TONSILLECTOMY      SOCIAL HISTORY: Social History   Socioeconomic History  . Marital status: Married    Spouse name: Not on file  . Number of children: Not on file  .  Years of education: Not on file  . Highest education level: Not on file  Occupational History  . Occupation: retired -[employer]  Social Needs  . Financial resource strain: Not on file  . Food insecurity:    Worry: Not on file    Inability: Not on file  . Transportation needs:    Medical: Not on file    Non-medical: Not on file  Tobacco Use  . Smoking status: Former Smoker    Packs/day: 0.25    Years: 46.00    Pack years: 11.50    Types: Cigarettes    Last attempt to quit: 02/24/2017    Years since quitting: 1.4  . Smokeless tobacco: Never Used  Substance and Sexual Activity  . Alcohol use: Yes    Alcohol/week: 0.0 standard drinks    Comment: 1-4 alcoholic beverages daily  . Drug use: No  . Sexual activity: Not on file  Lifestyle  . Physical activity:    Days per week: Not on file    Minutes per session: Not on file  . Stress: Not on file  Relationships  . Social connections:    Talks on  phone: Not on file    Gets together: Not on file    Attends religious service: Not on file    Active member of club or organization: Not on file    Attends meetings of clubs or organizations: Not on file    Relationship status: Not on file  . Intimate partner violence:    Fear of current or ex partner: Not on file    Emotionally abused: Not on file    Physically abused: Not on file    Forced sexual activity: Not on file  Other Topics Concern  . Not on file  Social History Narrative   She is retired from [employer]   Married to [employer]   Uses alcohol heavy in the past,   Former smoker quit in 2019   No substance abuse reported   Patient notes that she is drinking about 1 beer and 1 hard liquor every day - was counseled regarding absolute alcohol abstinence.  FAMILY HISTORY:  No family history of colon cancer.  ALLERGIES:  is allergic to bee venom; benadryl [diphenhydramine]; hydrocodone-acetaminophen; oxycodone; tape; and valium [diazepam].  MEDICATIONS:    Current Outpatient Medications  Medication Sig Dispense Refill  . acyclovir (ZOVIRAX) 400 MG tablet Take 1 tablet (400 mg total) by mouth 2 (two) times daily. 60 tablet 3  . albuterol (PROVENTIL HFA;VENTOLIN HFA) 108 (90 Base) MCG/ACT inhaler INHALE 2 PUFFS INTO THE LUNGS EVERY 6 HOURS AS NEEDED FOR WHEEZING OR SHORTNESS OF BREATH 18 g 1  . ANORO ELLIPTA 62.5-25 MCG/INH AEPB INHALE 1 PUFF INTO THE LUNGS DAILY 60 each 11  . ergocalciferol (VITAMIN D2) 50000 units capsule Take 1 capsule (50,000 Units total) by mouth once a week. 12 capsule 2  . folic acid (FOLVITE) 1 MG tablet TAKE 1 TABLET(1 MG) BY MOUTH DAILY 30 tablet 3  . furosemide (LASIX) 20 MG tablet Take 0.5 tablets (10 mg total) by mouth daily. 30 tablet 1  . lidocaine (LIDODERM) 5 % Place 1-2 patches onto the skin daily as needed (severe pain). Remove & Discard patch within 12 hours or as directed by MD    . MAGNESIUM-OXIDE 400 (241.3 Mg) MG tablet TAKE 1 TABLET BY MOUTH DAILY 30 tablet 0  . Menthol, Topical Analgesic, (BIOFREEZE EX) Apply topically as directed.    . ondansetron (ZOFRAN) 8 MG tablet Take 1 tablet (8 mg total) by mouth 2 (two) times daily as needed (Nausea or vomiting). 30 tablet 1  . OVER THE COUNTER MEDICATION Apply 400 mg topically daily as needed (arthritis/back pain). CBD cream    . potassium chloride SA (K-DUR,KLOR-CON) 20 MEQ tablet Take 1 tablet (20 mEq total) by mouth 2 (two) times daily. 180 tablet 0  . spironolactone (ALDACTONE) 50 MG tablet TAKE 1 TABLET(50 MG) BY MOUTH DAILY 30 tablet 0  . ixazomib citrate (NINLARO) 3 MG capsule Take 1 capsule (22m) by mouth once weekly for 3 weeks on, 1 week off. Take on an empty stomach 1hr before or 2hrs after food. (Patient not taking: Reported on 08/04/2018) 3 capsule 2  . pantoprazole (PROTONIX) 40 MG tablet Take 1 tablet (40 mg total) by mouth daily. (Patient not taking: Reported on 06/17/2018) 90 tablet 1  . potassium chloride (KLOR-CON) 20 MEQ packet Take 40 mEq by mouth  2 (two) times daily. (Patient not taking: Reported on 06/17/2018) 180 tablet 0   No current facility-administered medications for this visit.     REVIEW OF SYSTEMS:    A 10+ POINT  REVIEW OF SYSTEMS WAS OBTAINED including neurology, dermatology, psychiatry, cardiac, respiratory, lymph, extremities, GI, GU, Musculoskeletal, constitutional, breasts, reproductive, HEENT.  All pertinent positives are noted in the HPI.  All others are negative.   PHYSICAL EXAMINATION: ECOG PERFORMANCE STATUS: 2 - Symptomatic, <50% confined to bed  VS reviewed  GENERAL:alert, in no acute distress and comfortable SKIN: no acute rashes, no significant lesions EYES: conjunctiva are pink and non-injected, sclera anicteric OROPHARYNX: MMM, no exudates, no oropharyngeal erythema or ulceration NECK: supple, no JVD LYMPH:  no palpable lymphadenopathy in the cervical, axillary or inguinal regions LUNGS: clear to auscultation b/l with normal respiratory effort HEART: regular rate & rhythm ABDOMEN:  normoactive bowel sounds , non tender, not distended. No palpable hepatosplenomegaly.  Extremity: no pedal edema PSYCH: alert & oriented x 3 with fluent speech NEURO: no focal motor/sensory deficits   LABORATORY DATA:  I have reviewed the data as listed  . CBC Latest Ref Rng & Units 08/04/2018 06/17/2018 06/02/2018  WBC 4.0 - 10.5 K/uL 7.3 5.3 5.7  Hemoglobin 12.0 - 15.0 g/dL 16.2(H) 15.0 13.4  Hematocrit 36.0 - 46.0 % 44.7 42.9 38.4  Platelets 150 - 400 K/uL 273 231 151    . CMP Latest Ref Rng & Units 08/04/2018 06/17/2018 06/02/2018  Glucose 70 - 99 mg/dL 102(H) 105(H) 94  BUN 8 - 23 mg/dL 8 7(L) 9  Creatinine 0.44 - 1.00 mg/dL 0.60 0.66 0.61  Sodium 135 - 145 mmol/L 129(L) 136 134(L)  Potassium 3.5 - 5.1 mmol/L 4.1 4.5 4.7  Chloride 98 - 111 mmol/L 92(L) 99 101  CO2 22 - 32 mmol/L 28 27 26   Calcium 8.9 - 10.3 mg/dL 9.2 9.5 8.5(L)  Total Protein 6.5 - 8.1 g/dL 6.1(L) 6.3(L) 5.8(L)  Total Bilirubin 0.3 - 1.2  mg/dL 1.3(H) 1.1 1.0  Alkaline Phos 38 - 126 U/L 217(H) 195(H) 210(H)  AST 15 - 41 U/L 33 35 45(H)  ALT 0 - 44 U/L 18 15 21     . Lab Results  Component Value Date   IRON 136 05/06/2018   TIBC 274 05/06/2018   IRONPCTSAT 50 05/06/2018   (Iron and TIBC)  Lab Results  Component Value Date   FERRITIN 1,066 (H) 05/06/2018    10/16/15 BM Bx:   03/21/18 BM Bx:    RADIOGRAPHIC STUDIES: I have personally reviewed the radiological images as listed and agreed with the findings in the report. Ct Lumbar Spine Wo Contrast  Result Date: 07/25/2018 CLINICAL DATA:  Low back pain aggravated by standing EXAM: CT LUMBAR SPINE WITHOUT CONTRAST TECHNIQUE: Multidetector CT imaging of the lumbar spine was performed without intravenous contrast administration. Multiplanar CT image reconstructions were also generated. COMPARISON:  CT chest 08/13/2017 Bone survey 05/12/2016 FINDINGS: Segmentation: 5 lumbar type vertebrae. Alignment: Normal. Vertebrae: Generalized osteopenia. Chronic T10 vertebral body compression fracture with approximately 50% height loss. Chronic T11 vertebral body compression fracture with approximately 60% height loss. T12 vertebral body compression fracture with methylmethacrylate within the vertebral body from prior augmentation with approximately 75% height loss. Severe L1 vertebral body compression fracture with approximately 90% height loss unchanged from the prior exam. Mild mid L2 vertebral body compression fracture which is new compared with 08/13/2017 with less than 10% height loss. L4 and L5 vertebral body compression fractures with approximately 50% height loss similar in appearance to the prior bone survey dated 05/12/2016. Faint lucent lesion in the right L2 vertebral body which may reflect a true bone lesion secondary to multiple myeloma. Paraspinal and other soft  tissues: No acute paraspinal abnormality. Abdominal aortic atherosclerosis. Small right pleural effusion. Bilateral  medullary nephrocalcinosis. Small cholelithiasis. Other: Mild osteoarthritis of bilateral SI joints. Disc levels: Degenerative disc disease with disc height loss at L5-S1. mild broad-based disc bulges at L2-3, L3-4, L4-5 and L5-S1. IMPRESSION: 1. Age-indeterminate mild mid L2 vertebral body compression fracture which is new compared with 08/13/2017 with less than 10% height loss. 2. Chronic T10, T11, T12, L1, L4 and L5 vertebral body compression fractures. 3.  Aortic Atherosclerosis (ICD10-I70.0). Electronically Signed   By: Kathreen Devoid   On: 07/25/2018 16:52     ASSESSMENT & PLAN:   73 y.o. Caucasian female with severe osteoporosis and vertebral compression fractures  #1 IgA kappa Multiple myeloma    M protein level increased significantly to 3 ( 1.5-->1.8 ---> 1.9-->1.8-->1.7--> 3-->3.4-->3.7---> 0.4 --> Not observed as of 05/2218. Noted to have new anemia with a hgb of 10 Labs with no signs of  hypercalcemia or renal failure. No overt focal new bone pains. Bm Bx shows 21% abnormal plasma cells. Rpt Bone Survey on 05/12/2016 -- showed no evidence of lytic lesions.  02/28/18 Bone Survey which revealed New T7 compression deformity. Multiple chronic compression deformities are noted. No definitive lytic lesions are identified.   03/25/18 PET/CT revealed No evidence of active multiple myeloma within the skeleton. 2. No soft tissue mass to suggest plasmacytoma. 3. Aortic Atherosclerosis and Emphysema. 03/21/18 BM Bx revealed 30% atypical plasma cells   PLAN: -Discussed pt labwork today, 08/04/18; sodium at 129, other blood counts and chemistries are stable  -08/04/18 MMP and SFLC are pending. Last available M spike from 06/17/18 was not observed.  -Discussed the 07/25/18 CT Lumbar Spine which revealed Age-indeterminate mild mid L2 vertebral body compression fracture which is new compared with 08/13/2017 with less than 10% height loss. 2. Chronic T10, T11, T12, L1, L4 and L5 vertebral body compression  fractures. 3.  Aortic Atherosclerosis. -Pt was unable to tolerate Ninlaro, and will not continue maintenance treatment at this time. -Will continue watchful observation and will begin treatment again as needed -Set a goal for the pt to increase her food intake by 50% -Will refer the pt to our Nutritional Therapist Ernestene Kiel -Offered Marinol and other appetite stimulating medications, but pt would like to try to eat better on her own for now -Recommend Prilosec OTC  -Pt recently completed at home PT and would like to hold off for the outpatient PT -Will hold Calcitonin spray -Continue follow up with Dr. Carlean Purl in GI  -Continue Vitamin D replacement and a Vitamin B complex  -Continue Acyclovir -Continue Xgeva every 4 weeks, but holding today  -Will see the pt back in 2 months    #2 liver cirrhosis with known varices. Increased bilirubin levels. Plan -Counseled on absolute alcohol cessation. -continue f/u with PCP  #3 osteoporosis with vertebral compression fractures.  Plan  -continue aggressive vitamin D replacement . -Pt has had full dental extraction and has recovered well with dental clearance to begin Xgeva.  -continue Marchelle Folks, but holding today    -continue Xgeva q4 weeks with labs -referral to nutritional therapy - Ernestene Kiel RTC with Dr Irene Limbo with labs in 8 weeks   All of the patients and her female partners questions were answered to their apparent satisfaction. The patient knows to call the clinic with any problems, questions or concerns.  The total time spent in the appt was 25 minutes and more than 50% was on counseling and direct patient cares.  Sullivan Lone MD [place] AAHIVMS [facility] Hematology/Oncology Physician [facility]  (Office):       [phone] (Work cell):  [phone] (Fax):           [phone]  I, Baldwin Jamaica, am acting as a scribe for Dr. Sullivan Lone.   .I have reviewed the above documentation for accuracy and  completeness, and I agree with the above. Brunetta Genera MD

## 2018-08-03 NOTE — Telephone Encounter (Signed)
Patient called to verify appt times for tomorrow.

## 2018-08-04 ENCOUNTER — Inpatient Hospital Stay (HOSPITAL_BASED_OUTPATIENT_CLINIC_OR_DEPARTMENT_OTHER): Payer: Medicare Other | Admitting: Hematology

## 2018-08-04 ENCOUNTER — Telehealth: Payer: Self-pay | Admitting: Hematology

## 2018-08-04 ENCOUNTER — Inpatient Hospital Stay: Payer: Medicare Other

## 2018-08-04 ENCOUNTER — Inpatient Hospital Stay: Payer: Medicare Other | Attending: Hematology

## 2018-08-04 VITALS — BP 110/78 | HR 122 | Temp 98.1°F | Resp 18 | Ht 60.0 in | Wt 74.1 lb

## 2018-08-04 DIAGNOSIS — M8008XS Age-related osteoporosis with current pathological fracture, vertebra(e), sequela: Secondary | ICD-10-CM

## 2018-08-04 DIAGNOSIS — R112 Nausea with vomiting, unspecified: Secondary | ICD-10-CM | POA: Insufficient documentation

## 2018-08-04 DIAGNOSIS — K219 Gastro-esophageal reflux disease without esophagitis: Secondary | ICD-10-CM

## 2018-08-04 DIAGNOSIS — K5900 Constipation, unspecified: Secondary | ICD-10-CM | POA: Insufficient documentation

## 2018-08-04 DIAGNOSIS — C9000 Multiple myeloma not having achieved remission: Secondary | ICD-10-CM | POA: Insufficient documentation

## 2018-08-04 DIAGNOSIS — K7031 Alcoholic cirrhosis of liver with ascites: Secondary | ICD-10-CM

## 2018-08-04 DIAGNOSIS — I2510 Atherosclerotic heart disease of native coronary artery without angina pectoris: Secondary | ICD-10-CM | POA: Diagnosis not present

## 2018-08-04 DIAGNOSIS — M1990 Unspecified osteoarthritis, unspecified site: Secondary | ICD-10-CM | POA: Insufficient documentation

## 2018-08-04 DIAGNOSIS — H538 Other visual disturbances: Secondary | ICD-10-CM

## 2018-08-04 DIAGNOSIS — E440 Moderate protein-calorie malnutrition: Secondary | ICD-10-CM

## 2018-08-04 DIAGNOSIS — R197 Diarrhea, unspecified: Secondary | ICD-10-CM | POA: Insufficient documentation

## 2018-08-04 DIAGNOSIS — Z79899 Other long term (current) drug therapy: Secondary | ICD-10-CM | POA: Diagnosis not present

## 2018-08-04 DIAGNOSIS — X58XXXS Exposure to other specified factors, sequela: Secondary | ICD-10-CM | POA: Insufficient documentation

## 2018-08-04 DIAGNOSIS — M818 Other osteoporosis without current pathological fracture: Secondary | ICD-10-CM

## 2018-08-04 DIAGNOSIS — M545 Low back pain: Secondary | ICD-10-CM | POA: Insufficient documentation

## 2018-08-04 LAB — CBC WITH DIFFERENTIAL/PLATELET
Abs Immature Granulocytes: 0.04 10*3/uL (ref 0.00–0.07)
Basophils Absolute: 0.1 10*3/uL (ref 0.0–0.1)
Basophils Relative: 1 %
Eosinophils Absolute: 0.2 10*3/uL (ref 0.0–0.5)
Eosinophils Relative: 2 %
HCT: 44.7 % (ref 36.0–46.0)
Hemoglobin: 16.2 g/dL — ABNORMAL HIGH (ref 12.0–15.0)
IMMATURE GRANULOCYTES: 1 %
Lymphocytes Relative: 13 %
Lymphs Abs: 1 10*3/uL (ref 0.7–4.0)
MCH: 35.1 pg — ABNORMAL HIGH (ref 26.0–34.0)
MCHC: 36.2 g/dL — ABNORMAL HIGH (ref 30.0–36.0)
MCV: 96.8 fL (ref 80.0–100.0)
Monocytes Absolute: 1.4 10*3/uL — ABNORMAL HIGH (ref 0.1–1.0)
Monocytes Relative: 20 %
NEUTROS ABS: 4.6 10*3/uL (ref 1.7–7.7)
Neutrophils Relative %: 63 %
PLATELETS: 273 10*3/uL (ref 150–400)
RBC: 4.62 MIL/uL (ref 3.87–5.11)
RDW: 13.1 % (ref 11.5–15.5)
WBC: 7.3 10*3/uL (ref 4.0–10.5)
nRBC: 0 % (ref 0.0–0.2)

## 2018-08-04 LAB — CMP (CANCER CENTER ONLY)
ALT: 18 U/L (ref 0–44)
ANION GAP: 9 (ref 5–15)
AST: 33 U/L (ref 15–41)
Albumin: 3.5 g/dL (ref 3.5–5.0)
Alkaline Phosphatase: 217 U/L — ABNORMAL HIGH (ref 38–126)
BUN: 8 mg/dL (ref 8–23)
CO2: 28 mmol/L (ref 22–32)
Calcium: 9.2 mg/dL (ref 8.9–10.3)
Chloride: 92 mmol/L — ABNORMAL LOW (ref 98–111)
Creatinine: 0.6 mg/dL (ref 0.44–1.00)
GFR, Est AFR Am: >60 mL/min (ref >60)
GFR, Estimated: >60 mL/min (ref >60)
Glucose, Bld: 102 mg/dL — ABNORMAL HIGH (ref 70–99)
POTASSIUM: 4.1 mmol/L (ref 3.5–5.1)
Sodium: 129 mmol/L — ABNORMAL LOW (ref 135–145)
Total Bilirubin: 1.3 mg/dL — ABNORMAL HIGH (ref 0.3–1.2)
Total Protein: 6.1 g/dL — ABNORMAL LOW (ref 6.5–8.1)

## 2018-08-04 NOTE — Telephone Encounter (Signed)
Patient stated they were going to do the nutrition class else where.  Printed calendar and avs.

## 2018-08-04 NOTE — Patient Instructions (Signed)
Thank you for choosing [facility] to provide your oncology and hematology care.  To afford each patient quality time with our providers, please arrive 30 minutes before your scheduled appointment time.  If you arrive late for your appointment, you may be asked to reschedule.  We strive to give you quality time with our providers, and arriving late affects you and other patients whose appointments are after yours.    If you are a no show for multiple scheduled visits, you may be dismissed from the clinic at the providers discretion.     Again, thank you for choosing [facility], our hope is that these requests will decrease the amount of time that you wait before being seen by our physicians.  ______________________________________________________________________   Should you have questions after your visit to the [facility], please contact our office at [phone] between the hours of 8:30 and 4:30 p.m.    Voicemails left after 4:30p.m will not be returned until the following business day.     For prescription refill requests, please have your pharmacy contact us directly.  Please also try to allow 48 hours for prescription requests.     Please contact the scheduling department for questions regarding scheduling.  For scheduling of procedures such as PET scans, CT scans, MRI, Ultrasound, etc please contact central scheduling at [phone].     Resources For Cancer Patients and Caregivers:    Oncolink.org:  A wonderful resource for patients and healthcare providers for information regarding your disease, ways to tract your treatment, what to expect, etc.      American Cancer Society:  800-227-2345  Can help patients locate various types of support and financial assistance   Cancer Care: 1-800-813-HOPE (4673) Provides financial assistance, online support groups, medication/co-pay assistance.     [place] DSS:  336-641-3447 Where to apply  for food stamps, Medicaid, and utility assistance   Medicare Rights Center: 800-333-4114 Helps people with Medicare understand their rights and benefits, navigate the Medicare system, and secure the quality healthcare they deserve   SCAT: 336-333-6589 [place] [employer]'s shared-ride transportation service for eligible riders who have a disability that prevents them from riding the fixed route bus.     For additional information on assistance programs please contact our social worker:   Abigail Elmore:  [phone]  

## 2018-08-05 LAB — KAPPA/LAMBDA LIGHT CHAINS
Kappa free light chain: 3.9 mg/L (ref 3.3–19.4)
Kappa, lambda light chain ratio: 1.11 (ref 0.26–1.65)
Lambda free light chains: 3.5 mg/L — ABNORMAL LOW (ref 5.7–26.3)

## 2018-08-09 LAB — MULTIPLE MYELOMA PANEL, SERUM
Albumin SerPl Elph-Mcnc: 3.5 g/dL (ref 2.9–4.4)
Albumin/Glob SerPl: 1.6 (ref 0.7–1.7)
Alpha 1: 0.3 g/dL (ref 0.0–0.4)
Alpha2 Glob SerPl Elph-Mcnc: 0.8 g/dL (ref 0.4–1.0)
B-Globulin SerPl Elph-Mcnc: 0.8 g/dL (ref 0.7–1.3)
GAMMA GLOB SERPL ELPH-MCNC: 0.5 g/dL (ref 0.4–1.8)
GLOBULIN, TOTAL: 2.3 g/dL (ref 2.2–3.9)
IgA: 44 mg/dL — ABNORMAL LOW (ref 87–352)
IgG (Immunoglobin G), Serum: 594 mg/dL — ABNORMAL LOW (ref 700–1600)
IgM (Immunoglobulin M), Srm: 11 mg/dL — ABNORMAL LOW (ref 26–217)
Total Protein ELP: 5.8 g/dL — ABNORMAL LOW (ref 6.0–8.5)

## 2018-08-11 ENCOUNTER — Telehealth: Payer: Self-pay | Admitting: *Deleted

## 2018-08-11 ENCOUNTER — Other Ambulatory Visit: Payer: Self-pay | Admitting: Hematology

## 2018-08-11 DIAGNOSIS — C9000 Multiple myeloma not having achieved remission: Secondary | ICD-10-CM

## 2018-08-11 NOTE — Telephone Encounter (Signed)
Contacted patient to answer question on how long to remain on 'anti shingle' medicine. Per Dr. Irene Limbo, should stay on acyclovir for 6 months past last Velcade dose. Patient verbalized understanding.

## 2018-08-12 ENCOUNTER — Other Ambulatory Visit: Payer: Self-pay

## 2018-08-12 DIAGNOSIS — C9000 Multiple myeloma not having achieved remission: Secondary | ICD-10-CM

## 2018-08-12 MED ORDER — ACYCLOVIR 400 MG PO TABS: 400.0000 mg | ORAL_TABLET | Freq: Two times a day (BID) | ORAL | 3 refills | Status: DC

## 2018-08-23 ENCOUNTER — Telehealth: Payer: Self-pay | Admitting: *Deleted

## 2018-08-23 NOTE — Telephone Encounter (Signed)
Patient called to report both knees very painful, red and swollen. Tingling in feet has started again. Began Friday, has not improved. Is not running a temperature.  Advised her to try cool compresses to knees several times a day. Told her that MD will be informed and will contact patient with his advice.

## 2018-08-24 ENCOUNTER — Telehealth: Payer: Self-pay | Admitting: *Deleted

## 2018-08-24 MED ORDER — GABAPENTIN 100 MG PO CAPS: 200.0000 mg | ORAL_CAPSULE | Freq: Every day | ORAL | 1 refills | Status: DC

## 2018-08-24 NOTE — Telephone Encounter (Signed)
Contacted patient per Dr. Grier Mitts request to inform " her M spike was neg showing her myeloma is still in remission".  Patient verbalized understanding.

## 2018-08-24 NOTE — Telephone Encounter (Signed)
Patient stated that lower legs continue to feel tingly and painful at night. Knees red and slightly swollen, no temperature. Has tried cool and warm compresses and tried some Voltaren gel she had. Nothing has provided relief.  Information given  to Dr. Irene Limbo. Neurontin 200 mg qhs can be ordered if patient agrees. Patient would like to try medication. Will be sent to pharmacy per Dr. Irene Limbo order. Patient verbalized understanding.

## 2018-09-01 ENCOUNTER — Inpatient Hospital Stay: Payer: Medicare Other | Attending: Hematology

## 2018-09-01 ENCOUNTER — Inpatient Hospital Stay: Payer: Medicare Other

## 2018-09-01 DIAGNOSIS — C9000 Multiple myeloma not having achieved remission: Secondary | ICD-10-CM | POA: Insufficient documentation

## 2018-09-01 DIAGNOSIS — Z7189 Other specified counseling: Secondary | ICD-10-CM

## 2018-09-01 LAB — CMP (CANCER CENTER ONLY)
ALBUMIN: 3.3 g/dL — AB (ref 3.5–5.0)
ALT: 19 U/L (ref 0–44)
AST: 34 U/L (ref 15–41)
Alkaline Phosphatase: 275 U/L — ABNORMAL HIGH (ref 38–126)
Anion gap: 7 (ref 5–15)
BUN: 5 mg/dL — ABNORMAL LOW (ref 8–23)
CO2: 27 mmol/L (ref 22–32)
Calcium: 8.5 mg/dL — ABNORMAL LOW (ref 8.9–10.3)
Chloride: 95 mmol/L — ABNORMAL LOW (ref 98–111)
Creatinine: 0.53 mg/dL (ref 0.44–1.00)
GFR, Est AFR Am: >60 mL/min (ref >60)
GFR, Estimated: >60 mL/min (ref >60)
GLUCOSE: 130 mg/dL — AB (ref 70–99)
Potassium: 3.5 mmol/L (ref 3.5–5.1)
SODIUM: 129 mmol/L — AB (ref 135–145)
Total Bilirubin: 1.7 mg/dL — ABNORMAL HIGH (ref 0.3–1.2)
Total Protein: 5.2 g/dL — ABNORMAL LOW (ref 6.5–8.1)

## 2018-09-01 LAB — CBC WITH DIFFERENTIAL/PLATELET
Abs Immature Granulocytes: 0.02 10*3/uL (ref 0.00–0.07)
Basophils Absolute: 0.1 10*3/uL (ref 0.0–0.1)
Basophils Relative: 1 %
Eosinophils Absolute: 0.1 10*3/uL (ref 0.0–0.5)
Eosinophils Relative: 1 %
HCT: 37.5 % (ref 36.0–46.0)
HEMOGLOBIN: 13.7 g/dL (ref 12.0–15.0)
IMMATURE GRANULOCYTES: 0 %
LYMPHS PCT: 10 %
Lymphs Abs: 0.6 10*3/uL — ABNORMAL LOW (ref 0.7–4.0)
MCH: 35.1 pg — ABNORMAL HIGH (ref 26.0–34.0)
MCHC: 36.5 g/dL — ABNORMAL HIGH (ref 30.0–36.0)
MCV: 96.2 fL (ref 80.0–100.0)
Monocytes Absolute: 1 10*3/uL (ref 0.1–1.0)
Monocytes Relative: 16 %
Neutro Abs: 4.5 10*3/uL (ref 1.7–7.7)
Neutrophils Relative %: 72 %
Platelets: 216 10*3/uL (ref 150–400)
RBC: 3.9 MIL/uL (ref 3.87–5.11)
RDW: 14 % (ref 11.5–15.5)
WBC: 6.3 10*3/uL (ref 4.0–10.5)
nRBC: 0 % (ref 0.0–0.2)

## 2018-09-01 MED ORDER — DENOSUMAB 120 MG/1.7ML ~~LOC~~ SOLN
120.0000 mg | Freq: Once | SUBCUTANEOUS | Status: AC
  Administered 2018-09-01: 120 mg via SUBCUTANEOUS

## 2018-09-01 NOTE — Patient Instructions (Signed)

## 2018-09-01 NOTE — Progress Notes (Signed)
Pt receiving Xgeva injection today. Her Calcium is 8.5. Ok to treat per Dr. Irene Limbo will review calcium supplementation with Pt.

## 2018-09-02 LAB — KAPPA/LAMBDA LIGHT CHAINS
Kappa free light chain: 5.3 mg/L (ref 3.3–19.4)
Kappa, lambda light chain ratio: 0.95 (ref 0.26–1.65)
Lambda free light chains: 5.6 mg/L — ABNORMAL LOW (ref 5.7–26.3)

## 2018-09-05 LAB — MULTIPLE MYELOMA PANEL, SERUM
Albumin SerPl Elph-Mcnc: 3.1 g/dL (ref 2.9–4.4)
Albumin/Glob SerPl: 1.8 — ABNORMAL HIGH (ref 0.7–1.7)
Alpha 1: 0.3 g/dL (ref 0.0–0.4)
Alpha2 Glob SerPl Elph-Mcnc: 0.6 g/dL (ref 0.4–1.0)
B-GLOBULIN SERPL ELPH-MCNC: 0.5 g/dL — AB (ref 0.7–1.3)
Gamma Glob SerPl Elph-Mcnc: 0.4 g/dL (ref 0.4–1.8)
Globulin, Total: 1.8 g/dL — ABNORMAL LOW (ref 2.2–3.9)
IgA: 51 mg/dL — ABNORMAL LOW (ref 87–352)
IgG (Immunoglobin G), Serum: 572 mg/dL — ABNORMAL LOW (ref 700–1600)
IgM (Immunoglobulin M), Srm: 11 mg/dL — ABNORMAL LOW (ref 26–217)
TOTAL PROTEIN ELP: 4.9 g/dL — AB (ref 6.0–8.5)

## 2018-09-08 ENCOUNTER — Telehealth: Payer: Self-pay | Admitting: *Deleted

## 2018-09-08 NOTE — Telephone Encounter (Signed)
Patient stated gabapentin helping with leg/foot pain at night. Still having stabbing pains in legs and feet during the day. Information given to Dr. Irene Limbo

## 2018-09-09 ENCOUNTER — Other Ambulatory Visit: Payer: Self-pay | Admitting: Hematology

## 2018-09-09 ENCOUNTER — Telehealth: Payer: Self-pay | Admitting: *Deleted

## 2018-09-09 MED ORDER — GABAPENTIN 100 MG PO CAPS: ORAL_CAPSULE | ORAL | 1 refills | Status: DC

## 2018-09-09 NOTE — Telephone Encounter (Signed)
Dr. Irene Limbo wrote new rx for gabapentin for morning dose of 100 mg in addition to currently prescribed 200 mg at hs. He encourages her to f/u with PCP regarding neuropathy. Contacted patient and reviewed med change with her. She verbalized understanidng and states she has appt with PCP this coming Monday and will discuss neuropath with PCP.

## 2018-09-12 DIAGNOSIS — Z1389 Encounter for screening for other disorder: Secondary | ICD-10-CM | POA: Diagnosis not present

## 2018-09-12 DIAGNOSIS — Z0000 Encounter for general adult medical examination without abnormal findings: Secondary | ICD-10-CM | POA: Diagnosis not present

## 2018-09-15 ENCOUNTER — Telehealth: Payer: Self-pay | Admitting: *Deleted

## 2018-09-16 NOTE — Telephone Encounter (Signed)
Contacted office - Gabapentin helps some, but continues to have sharp pains in legs and feet during day. Night is better. Has Baclofen available from another provider, but wanted to know if ok to take together. Advised her that Dr. Irene Limbo would receive this information and she would receive call back. She verbalized understanding.

## 2018-09-16 NOTE — Telephone Encounter (Signed)
Per Dr. Irene Limbo, given patient's other health concerns, pain should be evaluated by PCP prior to further treatment and followed by PCP. Patient verbalized understanding.

## 2018-09-19 ENCOUNTER — Encounter: Payer: Self-pay | Admitting: Hematology

## 2018-09-22 ENCOUNTER — Encounter: Payer: Self-pay | Admitting: Hematology

## 2018-09-23 ENCOUNTER — Telehealth: Payer: Self-pay | Admitting: *Deleted

## 2018-09-23 ENCOUNTER — Encounter: Payer: Self-pay | Admitting: Hematology

## 2018-09-23 NOTE — Telephone Encounter (Signed)
Called patient regarding my chart message r/t elbow swelling. Dr. Irene Limbo reviewed information and asked that patient be encouraged to seek care from PCP and informed that he'll assess continuation of Xgeva at next appt prior to injection (2/12). Patient states she is seeing orthopedist next Wednesday about knees and will show pictures of elbow at appointment. She reports swelling almost gone at this time. She cannot recall any injury to arm or elbow. Has continued to have full range of motion and minimal pain even with swelling . Encouraged patient to elevate arm when and if possible. Suggested warm compresses for comfort - patient denies pain. Patient verbalized understanding.

## 2018-09-28 DIAGNOSIS — M25561 Pain in right knee: Secondary | ICD-10-CM | POA: Diagnosis not present

## 2018-09-28 DIAGNOSIS — M25562 Pain in left knee: Secondary | ICD-10-CM | POA: Diagnosis not present

## 2018-09-29 ENCOUNTER — Encounter: Payer: Self-pay | Admitting: Hematology

## 2018-09-30 ENCOUNTER — Telehealth: Payer: Self-pay | Admitting: *Deleted

## 2018-09-30 NOTE — Telephone Encounter (Signed)
Contacted patient in response to MyChart message on 3/5. Per Dr. Irene Limbo, inform patient that management of pain/neuropathy has multiple factors to consider. He recommends she follow up with her PCP for evaluation and to manage the dose of gabapentin and/or add other medications. Patient verbalized understanding.

## 2018-10-05 NOTE — Progress Notes (Signed)
[facility]    HEMATOLOGY/ONCOLOGY CLINIC NOTE  Date of Service: 10/06/18     Patient Care Team: Lanice Shirts, MD as PCP - General (Internal Medicine) Jeralene Peters, DDS as Dietitian (Periodontics)  Orthopedics - Dr. Layne Benton from [facility]   CHIEF COMPLAINTS/PURPOSE OF CONSULTATION:   F/u for continued mx of multiple myeloma  HISTORY OF PRESENTING ILLNESS:   Cassidy Barrett is a wonderful 73 y.o. female who has been referred to [place] by Dr Wandra Feinstein for evaluation and management of possible plasma cell dyscrasia.  Patient has a history of liver cirrhosis with ascites, alcohol abuse, emphysema, macrocytic anemia, osteoporosis and was seen by orthopedics for spinal vertebral compression fractures in the setting of osteoporosis.  She was noted to have multiple significant lab abnormalities including an elevated alkaline phosphatase and high total protein level.  Chronic appealing L1-L2 compression fractures  Patient subsequently had a bone scan to evaluate these compression fractures on 09/26/2015.  This showed mild increased activity within the superior endplates of L3, L4 and L5 likely representing subacute fractures. Increased activity within the T2 vertebral body compatible with known compression fracture. No evidence of suggest bony metastatic disease.  As a part of her workup she also had an SPEP done which shows 3 monoclonal protein bands for a total M protein of 2.3 g/dL. On IFE this is noted to be monoclonal IgA kappa protein. Her sedimentation rate was also elevated at 73.  She was referred to [place] for further evaluation to rule out multiple myeloma. Patient notes her mid and lower back pain related to her compression fractures. Also notes some left hip pain. Her labs from January 2017 did not show any anemia, hypercalcemia or renal failure.  She notes that she did have a colonoscopy in 2015 which demonstrated a 4.5 cm rectal polyp which was  removed piecemeal. Pathology showed a tubulovillous adenoma. Patient was also noted to have significant diverticulosis and was not recommended routine colonoscopies. Given her CT scan showing concern of rectal mass she was referred back to Dr. Henrene Pastor at [place] for consideration of a sigmoidoscopy/colonoscopy. Patient notes no overt rectal bleeding. No obstructive symptoms. No change in bowel habits. No new abdominal pain. She also needs to connect with gi for management of her liver cirrhosis and related issues.  INTERVAL HISTORY:  Cassidy Barrett is here for her scheduled followup for recently diagnosed myeloma. The patient's last visit with [place] was on 08/04/18. She is accompanied today by her partner. The pt reports that she is doing well overall.   The pt reports that she has had tingling and numbness in her legs in the interim. She notes that her left leg is worse than her right leg, she endorses intermittent sharp pains in her knees, which "doesn't last very long." The pt notes that she will be seeing her PCP later this week. The pt is taking 231m Gabapentin at night at 1083mGabapentin in the morning. The pt notes that her back pain has not been bad. She endorses lots of sensitivity in the skin of her legs, especially on the left. The pt has had PT at home. She denies any pain or sensitivity above the knees. She denies weakness in the legs.   Lab results today (10/06/18) of CBC w/diff and CMP is as follows: all values are WNL except for MCH at 35.4, Sodium at 132, Chloride at 95, Glucose at 128, BUN at 6, Calcium at 8.3, Total Protein at 5.4,  Albumin at 3.2, Alk Phos at 477, Total Bilirubin at 1.8. 10/06/18 MMP is pending  On review of systems, pt reports tingling and numbness in legs, worse on left, occasional sharp pains in knees, and denies leg weakness, new bone pains, new back pains, leg swelling, and any other symptoms.    MEDICAL HISTORY:  Past Medical History:  Diagnosis Date  .  Alcohol abuse   . Arthritis   . Ascites   . Atherosclerosis of aorta ([place])   . Cholelithiasis   . Cirrhosis ([place])   . Complication of anesthesia    Difficult time waking up after having biospy with colonoscopy  . 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 Compression fracture of lumbar vertebra ([facility])     L1 and L2  . Emphysema of lung ([place])   . History of multiple myeloma    smoldering  . Hx of adenomatous polyp of rectum 05/21/2014  . Hyponatremia   . Macrocytic anemia   . 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 Osteoporosis   . Pneumonia 04/2014  . Tobacco abuse   . Tubular adenoma   . Vertebral compression fracture ([facility])    ?? 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  SURGICAL HISTORY: Past Surgical History:  Procedure Laterality Date  . COLONOSCOPY N/A 05/24/2014   Procedure: COLONOSCOPY;  Surgeon: Gatha Mayer, MD;  Location: [facility];  Service: Endoscopy;  Laterality: N/A;  MAC if possible ultraslim colonoscope and gastroscope needed  . FLEXIBLE SIGMOIDOSCOPY N/A 05/21/2014   Procedure: FLEXIBLE SIGMOIDOSCOPY;  Surgeon: Gatha Mayer, MD;  Location: [facility];  Service: Endoscopy;  Laterality: N/A;  . KYPHOPLASTY N/A 12/12/2014   Procedure: KYPHOPLASTY;  Surgeon: Phylliss Bob, MD;  Location: [facility];  Service: Orthopedics;  Laterality: N/A;  T10 kyphoplasty  . MANDIBLE FRACTURE SURGERY     X 2  . TONSILLECTOMY      SOCIAL HISTORY: Social History   Socioeconomic History  . Marital status: Married    Spouse name: Not on file  . Number of children: Not on file  . Years of education: Not on file  . Highest education level: Not on file  Occupational History  . Occupation: retired -[employer]  Social Needs  . Financial resource strain: Not on file  . Food insecurity:    Worry: Not on file    Inability: Not on file  . Transportation needs:    Medical: Not on file    Non-medical: Not on file  Tobacco Use   . Smoking status: Former Smoker    Packs/day: 0.25    Years: 46.00    Pack years: 11.50    Types: Cigarettes    Last attempt to quit: 02/24/2017    Years since quitting: 1.6  . Smokeless tobacco: Never Used  Substance and Sexual Activity  . Alcohol use: Yes    Alcohol/week: 0.0 standard drinks    Comment: 1-4 alcoholic beverages daily  . Drug use: No  . Sexual activity: Not on file  Lifestyle  . Physical activity:    Days per week: Not on file    Minutes per session: Not on file  . Stress: Not on file  Relationships  . Social connections:    Talks on phone: Not on file    Gets together: Not on file    Attends religious service: Not on file    Active member of club or organization: Not on file    Attends meetings of  clubs or organizations: Not on file    Relationship status: Not on file  . Intimate partner violence:    Fear of current or ex partner: Not on file    Emotionally abused: Not on file    Physically abused: Not on file    Forced sexual activity: Not on file  Other Topics Concern  . Not on file  Social History Narrative   She is retired from [employer]   Married to [employer]   Uses alcohol heavy in the past,   Former smoker quit in 2019   No substance abuse reported   Patient notes that she is drinking about 1 beer and 1 hard liquor every day - was counseled regarding absolute alcohol abstinence.  FAMILY HISTORY:  No family history of colon cancer.  ALLERGIES:  is allergic to bee venom; benadryl [diphenhydramine]; hydrocodone-acetaminophen; oxycodone; tape; and valium [diazepam].  MEDICATIONS:  Current Outpatient Medications  Medication Sig Dispense Refill  . acyclovir (ZOVIRAX) 400 MG tablet Take 1 tablet (400 mg total) by mouth 2 (two) times daily. 60 tablet 3  . albuterol (PROVENTIL HFA;VENTOLIN HFA) 108 (90 Base) MCG/ACT inhaler INHALE 2 PUFFS INTO THE LUNGS EVERY 6 HOURS AS NEEDED FOR WHEEZING OR SHORTNESS OF BREATH 18 g 1  . ANORO ELLIPTA  62.5-25 MCG/INH AEPB INHALE 1 PUFF INTO THE LUNGS DAILY 60 each 11  . ergocalciferol (VITAMIN D2) 50000 units capsule Take 1 capsule (50,000 Units total) by mouth once a week. 12 capsule 2  . furosemide (LASIX) 20 MG tablet Take 0.5 tablets (10 mg total) by mouth daily. (Patient taking differently: Take 10 mg by mouth daily. Taking as needed) 30 tablet 1  . gabapentin (NEURONTIN) 100 MG capsule 1 cap (128m) in the morning and 2 caps (2025m po at bedtime. 120 capsule 1  . lidocaine (LIDODERM) 5 % Place 1-2 patches onto the skin daily as needed (severe pain). Remove & Discard patch within 12 hours or as directed by MD    . Menthol, Topical Analgesic, (BIOFREEZE EX) Apply topically as directed.    . ondansetron (ZOFRAN) 8 MG tablet Take 1 tablet (8 mg total) by mouth 2 (two) times daily as needed (Nausea or vomiting). 30 tablet 1  . OVER THE COUNTER MEDICATION Apply 400 mg topically daily as needed (arthritis/back pain). CBD cream    . folic acid (FOLVITE) 1 MG tablet TAKE 1 TABLET(1 MG) BY MOUTH DAILY (Patient not taking: Reported on 10/06/2018) 30 tablet 3  . MAGNESIUM-OXIDE 400 (241.3 Mg) MG tablet TAKE 1 TABLET BY MOUTH DAILY (Patient not taking: Reported on 10/06/2018) 30 tablet 0  . pantoprazole (PROTONIX) 40 MG tablet Take 1 tablet (40 mg total) by mouth daily. (Patient not taking: Reported on 10/06/2018) 90 tablet 1  . potassium chloride (KLOR-CON) 20 MEQ packet Take 40 mEq by mouth 2 (two) times daily. (Patient not taking: Reported on 06/17/2018) 180 tablet 0  . potassium chloride SA (K-DUR,KLOR-CON) 20 MEQ tablet Take 1 tablet (20 mEq total) by mouth 2 (two) times daily. (Patient not taking: Reported on 10/06/2018) 180 tablet 0  . spironolactone (ALDACTONE) 50 MG tablet TAKE 1 TABLET(50 MG) BY MOUTH DAILY (Patient not taking: Reported on 10/06/2018) 30 tablet 0   No current facility-administered medications for this visit.     REVIEW OF SYSTEMS:    A 10+ POINT REVIEW OF SYSTEMS WAS OBTAINED  including neurology, dermatology, psychiatry, cardiac, respiratory, lymph, extremities, GI, GU, Musculoskeletal, constitutional, breasts, reproductive, HEENT.  All pertinent positives  are noted in the HPI.  All others are negative.   PHYSICAL EXAMINATION: ECOG PERFORMANCE STATUS: 2 - Symptomatic, <50% confined to bed  VS reviewed  GENERAL:alert, in no acute distress and comfortable SKIN: no acute rashes, no significant lesions EYES: conjunctiva are pink and non-injected, sclera anicteric OROPHARYNX: MMM, no exudates, no oropharyngeal erythema or ulceration NECK: supple, no JVD LYMPH:  no palpable lymphadenopathy in the cervical, axillary or inguinal regions LUNGS: clear to auscultation b/l with normal respiratory effort HEART: regular rate & rhythm ABDOMEN:  normoactive bowel sounds , non tender, not distended. No palpable hepatosplenomegaly.  Extremity: no pedal edema PSYCH: alert & oriented x 3 with fluent speech NEURO: no focal motor/sensory deficits    LABORATORY DATA:  I have reviewed the data as listed  . CBC Latest Ref Rng & Units 10/06/2018 09/01/2018 08/04/2018  WBC 4.0 - 10.5 K/uL 8.7 6.3 7.3  Hemoglobin 12.0 - 15.0 g/dL 14.9 13.7 16.2(H)  Hematocrit 36.0 - 46.0 % 41.9 37.5 44.7  Platelets 150 - 400 K/uL 260 216 273    . CMP Latest Ref Rng & Units 10/06/2018 09/01/2018 08/04/2018  Glucose 70 - 99 mg/dL 128(H) 130(H) 102(H)  BUN 8 - 23 mg/dL 6(L) 5(L) 8  Creatinine 0.44 - 1.00 mg/dL 0.55 0.53 0.60  Sodium 135 - 145 mmol/L 132(L) 129(L) 129(L)  Potassium 3.5 - 5.1 mmol/L 3.8 3.5 4.1  Chloride 98 - 111 mmol/L 95(L) 95(L) 92(L)  CO2 22 - 32 mmol/L _0 Calcium 8.9 - 10.3 mg/dL 8.3(L) 8.5(L) 9.2  Total Protein 6.5 - 8.1 g/dL 5.4(L) 5.2(L) 6.1(L)  Total Bilirubin 0.3 - 1.2 mg/dL 1.8(H) 1.7(H) 1.3(H)  Alkaline Phos 38 - 126 U/L 477(H) 275(H) 217(H)  AST 15 - 41 U/L 35 34 33  ALT 0 - 44 U/L _1 . Lab Results  Component Value Date   IRON 136 05/06/2018   TIBC  274 05/06/2018   IRONPCTSAT 50 05/06/2018   (Iron and TIBC)  Lab Results  Component Value Date   FERRITIN 1,066 (H) 05/06/2018    10/16/15 BM Bx:   03/21/18 BM Bx:    RADIOGRAPHIC STUDIES: I have personally reviewed the radiological images as listed and agreed with the findings in the report. No results found.   ASSESSMENT & PLAN:   73 y.o. Caucasian female with severe osteoporosis and vertebral compression fractures  #1 IgA kappa Multiple myeloma    M protein level increased significantly to 3 ( 1.5-->1.8 ---> 1.9-->1.8-->1.7--> 3-->3.4-->3.7---> 0.4 --> Not observed as of 05/2218. Noted to have new anemia with a hgb of 10 Labs with no signs of  hypercalcemia or renal failure. No overt focal new bone pains. Bm Bx shows 21% abnormal plasma cells. Rpt Bone Survey on 05/12/2016 -- showed no evidence of lytic lesions.  02/28/18 Bone Survey which revealed New T7 compression deformity. Multiple chronic compression deformities are noted. No definitive lytic lesions are identified.   03/25/18 PET/CT revealed No evidence of active multiple myeloma within the skeleton. 2. No soft tissue mass to suggest plasmacytoma. 3. Aortic Atherosclerosis and Emphysema. 03/21/18 BM Bx revealed 30% atypical plasma cells   07/25/18 CT Lumbar Spine revealed Age-indeterminate mild mid L2 vertebral body compression fracture which is new compared with 08/13/2017 with less than 10% height loss. 2. Chronic T10, T11, T12, L1, L4 and L5 vertebral body compression fractures. 3.  Aortic Atherosclerosis.  PLAN: -Discussed pt labwork today, 10/06/18; blood counts normal including HGB improved to  14.9, low sodium due in setting of cirrhosis, other chemistries are stable -10/06/18 MMP is pending. Last available MMP from 09/01/18 did not observe an M spike. -Discussed that the pt has had multiple compression fractures, and may possibly have radiculopathy as opposed to just neuropathy alone. 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 -Recommend beginning a Vitamin B complex -Will increase Gabapentin to 213m in the morning and 3029mat night -Folow up with PCP Dr. ScCoralyn MarkRecommend PCP consider neurology referral for further work up of neuropathic elements -No live virus vaccines. Okay to have component virus vaccines. -Pt was unable to tolerate [place], and will not continue maintenance treatmen -Will continue watchful observation and will begin treatment again as needed -Did refer the pt to our NuThayerecently completed at home PT and would like to hold off for the outpatient PT -Will hold Calcitonin spray -Continue follow up with Dr. GeCarlean Purln GI  -Continue Vitamin D replacement and a Vitamin B complex  -Continue Acyclovir -Continue Xgeva every 4 weeks -Will see the pt back in 8 weeks  #2 liver cirrhosis with known varices. Increased bilirubin levels. Plan -Counseled on absolute alcohol cessation. -continue f/u with PCP  #3 osteoporosis with vertebral compression fractures.  Plan  -continue aggressive vitamin D replacement . -Pt has had full dental extraction and has recovered well with dental clearance to begin Xgeva.  -continue Xgeva q4weeks   continue Xgeva q4 weeks with labs- schedule next 6 doses RTC with Dr KaIrene Limboith labs in 8 weeks   All of the patients and her female partners questions were answered to their apparent satisfaction. The patient knows to call the clinic with any problems, questions or concerns.  The total time spent in the appt was 25 minutes and more than 50% was on counseling and direct patient cares.    GaSullivan LoneD MSMendotaAHIVMS SCDefiance Regional Medical CenterTSurgical Specialty Centerematology/Oncology Physician CoMt Pleasant Surgery Ctr(Office):       [phone] cell):  [phone]):           [phone], ScBaldwin Jamaicaam  acting as a scribe for Dr. GaSullivan Lone  .I have reviewed the above documentation for accuracy and completeness, and I agree with the above. .GBrunetta GeneraD

## 2018-10-06 ENCOUNTER — Ambulatory Visit: Payer: Self-pay

## 2018-10-06 ENCOUNTER — Inpatient Hospital Stay: Payer: Medicare Other

## 2018-10-06 ENCOUNTER — Other Ambulatory Visit: Payer: Self-pay

## 2018-10-06 ENCOUNTER — Inpatient Hospital Stay: Payer: Medicare Other | Attending: Hematology | Admitting: Hematology

## 2018-10-06 ENCOUNTER — Telehealth: Payer: Self-pay | Admitting: Hematology

## 2018-10-06 VITALS — BP 124/69 | HR 95 | Temp 97.6°F | Resp 18 | Ht 60.0 in | Wt 80.7 lb

## 2018-10-06 DIAGNOSIS — C9000 Multiple myeloma not having achieved remission: Secondary | ICD-10-CM | POA: Insufficient documentation

## 2018-10-06 DIAGNOSIS — C9001 Multiple myeloma in remission: Secondary | ICD-10-CM

## 2018-10-06 DIAGNOSIS — F17210 Nicotine dependence, cigarettes, uncomplicated: Secondary | ICD-10-CM | POA: Diagnosis not present

## 2018-10-06 DIAGNOSIS — M545 Low back pain: Secondary | ICD-10-CM

## 2018-10-06 DIAGNOSIS — Z87311 Personal history of (healed) other pathological fracture: Secondary | ICD-10-CM | POA: Diagnosis not present

## 2018-10-06 DIAGNOSIS — Z7189 Other specified counseling: Secondary | ICD-10-CM

## 2018-10-06 DIAGNOSIS — K7031 Alcoholic cirrhosis of liver with ascites: Secondary | ICD-10-CM | POA: Diagnosis not present

## 2018-10-06 DIAGNOSIS — M25552 Pain in left hip: Secondary | ICD-10-CM | POA: Insufficient documentation

## 2018-10-06 DIAGNOSIS — M810 Age-related osteoporosis without current pathological fracture: Secondary | ICD-10-CM | POA: Diagnosis not present

## 2018-10-06 DIAGNOSIS — Z79899 Other long term (current) drug therapy: Secondary | ICD-10-CM | POA: Insufficient documentation

## 2018-10-06 DIAGNOSIS — G629 Polyneuropathy, unspecified: Secondary | ICD-10-CM

## 2018-10-06 LAB — CMP (CANCER CENTER ONLY)
ALT: 14 U/L (ref 0–44)
AST: 35 U/L (ref 15–41)
Albumin: 3.2 g/dL — ABNORMAL LOW (ref 3.5–5.0)
Alkaline Phosphatase: 477 U/L — ABNORMAL HIGH (ref 38–126)
Anion gap: 11 (ref 5–15)
BILIRUBIN TOTAL: 1.8 mg/dL — AB (ref 0.3–1.2)
BUN: 6 mg/dL — ABNORMAL LOW (ref 8–23)
CO2: 26 mmol/L (ref 22–32)
Calcium: 8.3 mg/dL — ABNORMAL LOW (ref 8.9–10.3)
Chloride: 95 mmol/L — ABNORMAL LOW (ref 98–111)
Creatinine: 0.55 mg/dL (ref 0.44–1.00)
GFR, Est AFR Am: >60 mL/min (ref >60)
Glucose, Bld: 128 mg/dL — ABNORMAL HIGH (ref 70–99)
Potassium: 3.8 mmol/L (ref 3.5–5.1)
Sodium: 132 mmol/L — ABNORMAL LOW (ref 135–145)
TOTAL PROTEIN: 5.4 g/dL — AB (ref 6.5–8.1)

## 2018-10-06 LAB — CBC WITH DIFFERENTIAL/PLATELET
Abs Immature Granulocytes: 0.04 10*3/uL (ref 0.00–0.07)
BASOS PCT: 1 %
Basophils Absolute: 0.1 10*3/uL (ref 0.0–0.1)
Eosinophils Absolute: 0 10*3/uL (ref 0.0–0.5)
Eosinophils Relative: 1 %
HCT: 41.9 % (ref 36.0–46.0)
Hemoglobin: 14.9 g/dL (ref 12.0–15.0)
Immature Granulocytes: 1 %
Lymphocytes Relative: 9 %
Lymphs Abs: 0.8 10*3/uL (ref 0.7–4.0)
MCH: 35.4 pg — ABNORMAL HIGH (ref 26.0–34.0)
MCHC: 35.6 g/dL (ref 30.0–36.0)
MCV: 99.5 fL (ref 80.0–100.0)
Monocytes Absolute: 1 10*3/uL (ref 0.1–1.0)
Monocytes Relative: 12 %
NRBC: 0 % (ref 0.0–0.2)
Neutro Abs: 6.7 10*3/uL (ref 1.7–7.7)
Neutrophils Relative %: 76 %
Platelets: 260 10*3/uL (ref 150–400)
RBC: 4.21 MIL/uL (ref 3.87–5.11)
RDW: 13.7 % (ref 11.5–15.5)
WBC: 8.7 10*3/uL (ref 4.0–10.5)

## 2018-10-06 MED ORDER — DENOSUMAB 120 MG/1.7ML ~~LOC~~ SOLN
SUBCUTANEOUS | Status: AC
  Filled 2018-10-06: qty 1.7

## 2018-10-06 MED ORDER — DENOSUMAB 120 MG/1.7ML ~~LOC~~ SOLN
120.0000 mg | Freq: Once | SUBCUTANEOUS | Status: AC
  Administered 2018-10-06: 120 mg via SUBCUTANEOUS

## 2018-10-06 NOTE — Telephone Encounter (Signed)
Scheduled appt per 3/12 los. ° °Printed calendar and avs. ° °

## 2018-10-06 NOTE — Patient Instructions (Signed)
Thank you for choosing [facility] to provide your oncology and hematology care.  To afford each patient quality time with our providers, please arrive 30 minutes before your scheduled appointment time.  If you arrive late for your appointment, you may be asked to reschedule.  We strive to give you quality time with our providers, and arriving late affects you and other patients whose appointments are after yours.    If you are a no show for multiple scheduled visits, you may be dismissed from the clinic at the providers discretion.     Again, thank you for choosing [facility], our hope is that these requests will decrease the amount of time that you wait before being seen by our physicians.  ______________________________________________________________________   Should you have questions after your visit to the [facility], please contact our office at [phone] between the hours of 8:30 and 4:30 p.m.    Voicemails left after 4:30p.m will not be returned until the following business day.     For prescription refill requests, please have your pharmacy contact us directly.  Please also try to allow 48 hours for prescription requests.     Please contact the scheduling department for questions regarding scheduling.  For scheduling of procedures such as PET scans, CT scans, MRI, Ultrasound, etc please contact central scheduling at [phone].     Resources For Cancer Patients and Caregivers:    Oncolink.org:  A wonderful resource for patients and healthcare providers for information regarding your disease, ways to tract your treatment, what to expect, etc.      American Cancer Society:  800-227-2345  Can help patients locate various types of support and financial assistance   Cancer Care: 1-800-813-HOPE (4673) Provides financial assistance, online support groups, medication/co-pay assistance.     [place] DSS:  336-641-3447 Where to apply  for food stamps, Medicaid, and utility assistance   Medicare Rights Center: 800-333-4114 Helps people with Medicare understand their rights and benefits, navigate the Medicare system, and secure the quality healthcare they deserve   SCAT: 336-333-6589 [employer] [employer]'s shared-ride transportation service for eligible riders who have a disability that prevents them from riding the fixed route bus.     For additional information on assistance programs please contact our social worker:   Abigail Elmore:  [phone]  

## 2018-10-06 NOTE — Progress Notes (Signed)
Calcium 8.3 Provider aware and ok with giving xgeva at this time.

## 2018-10-10 LAB — MULTIPLE MYELOMA PANEL, SERUM
Albumin SerPl Elph-Mcnc: 3.1 g/dL (ref 2.9–4.4)
Albumin/Glob SerPl: 1.5 (ref 0.7–1.7)
Alpha 1: 0.3 g/dL (ref 0.0–0.4)
Alpha2 Glob SerPl Elph-Mcnc: 0.6 g/dL (ref 0.4–1.0)
B-Globulin SerPl Elph-Mcnc: 0.6 g/dL — ABNORMAL LOW (ref 0.7–1.3)
Gamma Glob SerPl Elph-Mcnc: 0.7 g/dL (ref 0.4–1.8)
Globulin, Total: 2.1 g/dL — ABNORMAL LOW (ref 2.2–3.9)
IGG (IMMUNOGLOBIN G), SERUM: 739 mg/dL (ref 700–1600)
IgA: 114 mg/dL (ref 87–352)
IgM (Immunoglobulin M), Srm: 15 mg/dL — ABNORMAL LOW (ref 26–217)
Total Protein ELP: 5.2 g/dL — ABNORMAL LOW (ref 6.0–8.5)

## 2018-10-11 ENCOUNTER — Encounter: Payer: Self-pay | Admitting: *Deleted

## 2018-10-18 ENCOUNTER — Telehealth: Payer: Self-pay | Admitting: *Deleted

## 2018-10-18 ENCOUNTER — Encounter: Payer: Self-pay | Admitting: Hematology

## 2018-10-18 NOTE — Telephone Encounter (Signed)
Patient asked if possible for a B12 test to be added to lab test on 4/9. Said Dr. Coralyn Mark was going to have her come in for one, but she knew she had a lab appt here on 4/9. Informed her will check with Dr. Irene Limbo and will call her if it can't be done.

## 2018-10-19 ENCOUNTER — Telehealth: Payer: Self-pay | Admitting: *Deleted

## 2018-10-19 ENCOUNTER — Other Ambulatory Visit: Payer: Self-pay | Admitting: *Deleted

## 2018-10-19 DIAGNOSIS — C9000 Multiple myeloma not having achieved remission: Secondary | ICD-10-CM

## 2018-10-24 ENCOUNTER — Telehealth: Payer: Self-pay | Admitting: *Deleted

## 2018-10-24 NOTE — Telephone Encounter (Signed)
[employer] cancer insurance form completed/signed by Dr. Kale/pathology results attached. Copy sent to HIM to be scanned into chart. Original at front desk [facility] for patient pick up.

## 2018-11-03 ENCOUNTER — Inpatient Hospital Stay: Payer: Medicare Other

## 2018-11-03 ENCOUNTER — Inpatient Hospital Stay: Payer: Medicare Other | Attending: Hematology

## 2018-11-03 ENCOUNTER — Other Ambulatory Visit: Payer: Self-pay

## 2018-11-03 DIAGNOSIS — C9000 Multiple myeloma not having achieved remission: Secondary | ICD-10-CM | POA: Diagnosis not present

## 2018-11-03 DIAGNOSIS — Z7189 Other specified counseling: Secondary | ICD-10-CM

## 2018-11-03 LAB — CBC WITH DIFFERENTIAL/PLATELET
Abs Immature Granulocytes: 0.03 10*3/uL (ref 0.00–0.07)
Basophils Absolute: 0.1 10*3/uL (ref 0.0–0.1)
Basophils Relative: 1 %
Eosinophils Absolute: 0.1 10*3/uL (ref 0.0–0.5)
Eosinophils Relative: 1 %
HCT: 39.6 % (ref 36.0–46.0)
Hemoglobin: 14.1 g/dL (ref 12.0–15.0)
Immature Granulocytes: 0 %
Lymphocytes Relative: 12 %
Lymphs Abs: 1 10*3/uL (ref 0.7–4.0)
MCH: 37 pg — ABNORMAL HIGH (ref 26.0–34.0)
MCHC: 35.6 g/dL (ref 30.0–36.0)
MCV: 103.9 fL — ABNORMAL HIGH (ref 80.0–100.0)
Monocytes Absolute: 1.2 10*3/uL — ABNORMAL HIGH (ref 0.1–1.0)
Monocytes Relative: 14 %
Neutro Abs: 6.1 10*3/uL (ref 1.7–7.7)
Neutrophils Relative %: 72 %
Platelets: 251 10*3/uL (ref 150–400)
RBC: 3.81 MIL/uL — ABNORMAL LOW (ref 3.87–5.11)
RDW: 14.3 % (ref 11.5–15.5)
WBC: 8.5 10*3/uL (ref 4.0–10.5)
nRBC: 0 % (ref 0.0–0.2)

## 2018-11-03 LAB — CMP (CANCER CENTER ONLY)
ALT: 19 U/L (ref 0–44)
AST: 40 U/L (ref 15–41)
Albumin: 2.9 g/dL — ABNORMAL LOW (ref 3.5–5.0)
Alkaline Phosphatase: 566 U/L — ABNORMAL HIGH (ref 38–126)
Anion gap: 12 (ref 5–15)
BUN: 5 mg/dL — ABNORMAL LOW (ref 8–23)
CO2: 27 mmol/L (ref 22–32)
Calcium: 8.1 mg/dL — ABNORMAL LOW (ref 8.9–10.3)
Chloride: 93 mmol/L — ABNORMAL LOW (ref 98–111)
Creatinine: 0.54 mg/dL (ref 0.44–1.00)
GFR, Est AFR Am: >60 mL/min (ref >60)
GFR, Estimated: >60 mL/min (ref >60)
Glucose, Bld: 110 mg/dL — ABNORMAL HIGH (ref 70–99)
Potassium: 3.9 mmol/L (ref 3.5–5.1)
Sodium: 132 mmol/L — ABNORMAL LOW (ref 135–145)
Total Bilirubin: 2.2 mg/dL — ABNORMAL HIGH (ref 0.3–1.2)
Total Protein: 5.3 g/dL — ABNORMAL LOW (ref 6.5–8.1)

## 2018-11-03 MED ORDER — DENOSUMAB 120 MG/1.7ML ~~LOC~~ SOLN
120.0000 mg | Freq: Once | SUBCUTANEOUS | Status: AC
  Administered 2018-11-03: 120 mg via SUBCUTANEOUS

## 2018-11-03 MED ORDER — DENOSUMAB 120 MG/1.7ML ~~LOC~~ SOLN
SUBCUTANEOUS | Status: AC
  Filled 2018-11-03: qty 1.7

## 2018-11-03 NOTE — Progress Notes (Signed)
Patient due for xgeva today. Noting CorrCa is 9 mg/dL.   Jalene Mullet, PharmD PGY2 Hematology/ Oncology Pharmacy Resident 11/03/2018 12:36 PM

## 2018-11-03 NOTE — Progress Notes (Signed)
Per Dr. Irene Limbo - Ok to give [facility] today (Ca 8.1)

## 2018-11-04 LAB — MULTIPLE MYELOMA PANEL, SERUM
Albumin SerPl Elph-Mcnc: 2.7 g/dL — ABNORMAL LOW (ref 2.9–4.4)
Albumin/Glob SerPl: 1.3 (ref 0.7–1.7)
Alpha 1: 0.3 g/dL (ref 0.0–0.4)
Alpha2 Glob SerPl Elph-Mcnc: 0.6 g/dL (ref 0.4–1.0)
B-Globulin SerPl Elph-Mcnc: 0.5 g/dL — ABNORMAL LOW (ref 0.7–1.3)
Gamma Glob SerPl Elph-Mcnc: 0.7 g/dL (ref 0.4–1.8)
Globulin, Total: 2.1 g/dL — ABNORMAL LOW (ref 2.2–3.9)
IgA: 151 mg/dL (ref 87–352)
IgG (Immunoglobin G), Serum: 766 mg/dL (ref 586–1602)
IgM (Immunoglobulin M), Srm: 17 mg/dL — ABNORMAL LOW (ref 26–217)
Total Protein ELP: 4.8 g/dL — ABNORMAL LOW (ref 6.0–8.5)

## 2018-11-04 LAB — KAPPA/LAMBDA LIGHT CHAINS
Kappa free light chain: 7.1 mg/L (ref 3.3–19.4)
Kappa, lambda light chain ratio: 0.66 (ref 0.26–1.65)
Lambda free light chains: 10.8 mg/L (ref 5.7–26.3)

## 2018-11-10 ENCOUNTER — Telehealth: Payer: Self-pay | Admitting: *Deleted

## 2018-11-10 NOTE — Telephone Encounter (Signed)
Patient asked that Lab results from 4/9 be faxed to Dr. Coralyn Mark, [phone]. Results faxed and fax confirmation received. Patient also reports better sleep now since Dr. Coralyn Mark increased Gabapentin for leg/foot pain. States also using TENS unit on feet at night.

## 2018-11-15 DIAGNOSIS — R600 Localized edema: Secondary | ICD-10-CM | POA: Diagnosis not present

## 2018-11-15 DIAGNOSIS — M79662 Pain in left lower leg: Secondary | ICD-10-CM | POA: Diagnosis not present

## 2018-11-15 DIAGNOSIS — M79661 Pain in right lower leg: Secondary | ICD-10-CM | POA: Diagnosis not present

## 2018-11-23 DIAGNOSIS — R600 Localized edema: Secondary | ICD-10-CM | POA: Diagnosis not present

## 2018-11-23 DIAGNOSIS — M79661 Pain in right lower leg: Secondary | ICD-10-CM | POA: Diagnosis not present

## 2018-11-23 DIAGNOSIS — M79662 Pain in left lower leg: Secondary | ICD-10-CM | POA: Diagnosis not present

## 2018-11-30 ENCOUNTER — Other Ambulatory Visit: Payer: Self-pay | Admitting: Hematology

## 2018-11-30 DIAGNOSIS — C9000 Multiple myeloma not having achieved remission: Secondary | ICD-10-CM

## 2018-11-30 NOTE — Progress Notes (Signed)
Marland Kitchen    HEMATOLOGY/ONCOLOGY CLINIC NOTE  Date of Service: 12/01/18     Patient Care Team: Vicenta Aly, [place] as PCP - General (Nurse Practitioner) Jeralene Peters, DDS as Dietitian (Periodontics)  Orthopedics - Dr. Layne Benton from [facility]   CHIEF COMPLAINTS/PURPOSE OF CONSULTATION:   F/u for continued mx of multiple myeloma  HISTORY OF PRESENTING ILLNESS:   Cassidy Barrett is a wonderful 73 y.o. female who has been referred to [place] by Dr Wandra Feinstein for evaluation and management of possible plasma cell dyscrasia.  Patient has a history of liver cirrhosis with ascites, alcohol abuse, emphysema, macrocytic anemia, osteoporosis and was seen by orthopedics for spinal vertebral compression fractures in the setting of osteoporosis.  She was noted to have multiple significant lab abnormalities including an elevated alkaline phosphatase and high total protein level.  Chronic appealing L1-L2 compression fractures  Patient subsequently had a bone scan to evaluate these compression fractures on 09/26/2015.  This showed mild increased activity within the superior endplates of L3, L4 and L5 likely representing subacute fractures. Increased activity within the T2 vertebral body compatible with known compression fracture. No evidence of suggest bony metastatic disease.  As a part of her workup she also had an SPEP done which shows 3 monoclonal protein bands for a total M protein of 2.3 g/dL. On IFE this is noted to be monoclonal IgA kappa protein. Her sedimentation rate was also elevated at 73.  She was referred to [place] for further evaluation to rule out multiple myeloma. Patient notes her mid and lower back pain related to her compression fractures. Also notes some left hip pain. Her labs from January 2017 did not show any anemia, hypercalcemia or renal failure.  She notes that she did have a colonoscopy in 2015 which demonstrated a 4.5 cm rectal polyp which was removed  piecemeal. Pathology showed a tubulovillous adenoma. Patient was also noted to have significant diverticulosis and was not recommended routine colonoscopies. Given her CT scan showing concern of rectal mass she was referred back to Dr. Henrene Pastor at [facility] for consideration of a sigmoidoscopy/colonoscopy. Patient notes no overt rectal bleeding. No obstructive symptoms. No change in bowel habits. No new abdominal pain. She also needs to connect with gi for management of her liver cirrhosis and related issues.  INTERVAL HISTORY:  Cassidy Barrett is here for her scheduled followup for recently diagnosed myeloma. The patient's last visit with [place] was on 10/06/18. The pt reports that she is doing well overall.  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 She notes that she has continued on 366m Gabapentin TID which has been helpful for her. The pt notes that she continuously feels "small tingling," but the "stabbing pain," presents intermittently. She has been using magnesium spray on her feet which she notes is helpful. She has also been using a tens unit which she notes has been helpful. She notes elevating her legs is also helpful.   The pt notes that she feels weaker, and is trying to eat several small meals each day. She notes that her neuropathy pain is inhibiting her from eating as well, but notes that her eating is approving.   The pt denies developing any concerns for infections and denies cough, sneezing, runny nose, SOB, or sore throat.  Lab results today (12/01/18) of CBC w/diff and CMP is as follows: all values are WNL except for WBC at 12.8k, RBC at 3.56, MCV at  105.6, MCH at 37.4, ANC at 10.4k, Sodium at 130, Chloride at 92, Glucose at 134, BUN at 5, Calcium at 8.0, Total Protein at 5.0, Albumin at 2.6, AST at 50, Alk Phos at 588, Total Bilirubin at 2.9. 12/01/18 MMP - no M spike   On review of systems, pt reports peripheral neuropathy, slowly gaining appetite,  eating marginally better, moving her bowels well, and denies sore throat, cough, runny nose, concern for infections, and any other symptoms.   MEDICAL HISTORY:  Past Medical History:  Diagnosis Date  . Alcohol abuse   . Arthritis   . Ascites   . Atherosclerosis of aorta ([place])   . Cholelithiasis   . Cirrhosis ([place])   . Complication of anesthesia    Difficult time waking up after having biospy with colonoscopy  . 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 Compression fracture of lumbar vertebra ([facility])     L1 and L2  . Emphysema of lung ([place])   . History of multiple myeloma    smoldering  . Hx of adenomatous polyp of rectum 05/21/2014  . Hyponatremia   . Macrocytic anemia   . 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 Osteoporosis   . Pneumonia 04/2014  . Tobacco abuse   . Tubular adenoma   . Vertebral compression fracture ([facility])    ?? 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  SURGICAL HISTORY: Past Surgical History:  Procedure Laterality Date  . COLONOSCOPY N/A 05/24/2014   Procedure: COLONOSCOPY;  Surgeon: Gatha Mayer, MD;  Location: [facility];  Service: Endoscopy;  Laterality: N/A;  MAC if possible ultraslim colonoscope and gastroscope needed  . FLEXIBLE SIGMOIDOSCOPY N/A 05/21/2014   Procedure: FLEXIBLE SIGMOIDOSCOPY;  Surgeon: Gatha Mayer, MD;  [facility];  Service: Endoscopy;  Laterality: N/A;  . KYPHOPLASTY N/A 12/12/2014   Procedure: KYPHOPLASTY;  Surgeon: Phylliss Bob, MD;  Location: [facility];  Service: Orthopedics;  Laterality: N/A;  T10 kyphoplasty  . MANDIBLE FRACTURE SURGERY     X 2  . TONSILLECTOMY      SOCIAL HISTORY: Social History   Socioeconomic History  . Marital status: Married    Spouse name: Not on file  . Number of children: Not on file  . Years of education: Not on file  . Highest education level: Not on file  Occupational History  . Occupation: retired -[employer]  Social Needs   . Financial resource strain: Not on file  . Food insecurity:    Worry: Not on file    Inability: Not on file  . Transportation needs:    Medical: Not on file    Non-medical: Not on file  Tobacco Use  . Smoking status: Former Smoker    Packs/day: 0.25    Years: 46.00    Pack years: 11.50    Types: Cigarettes    Last attempt to quit: 02/24/2017    Years since quitting: 1.7  . Smokeless tobacco: Never Used  Substance and Sexual Activity  . Alcohol use: Yes    Alcohol/week: 0.0 standard drinks    Comment: 1-4 alcoholic beverages daily  . Drug use: No  . Sexual activity: Not on file  Lifestyle  . Physical activity:    Days per week: Not on file    Minutes per session: Not on file  . Stress: Not on file  Relationships  . Social connections:    Talks on phone: Not on file    Gets together: Not on  file    Attends religious service: Not on file    Active member of club or organization: Not on file    Attends meetings of clubs or organizations: Not on file    Relationship status: Not on file  . Intimate partner violence:    Fear of current or ex partner: Not on file    Emotionally abused: Not on file    Physically abused: Not on file    Forced sexual activity: Not on file  Other Topics Concern  . Not on file  Social History Narrative   She is retired from [employer]   Married to [employer]   Uses alcohol heavy in the past,   Former smoker quit in 2019   No substance abuse reported   Patient notes that she is drinking about 1 beer and 1 hard liquor every day - was counseled regarding absolute alcohol abstinence.  FAMILY HISTORY:  No family history of colon cancer.  ALLERGIES:  is allergic to bee venom; benadryl [diphenhydramine]; hydrocodone-acetaminophen; oxycodone; tape; and valium [diazepam].  MEDICATIONS:  Current Outpatient Medications  Medication Sig Dispense Refill  . ergocalciferol (VITAMIN D2) 50000 units capsule Take 1 capsule (50,000 Units total) by  mouth once a week. 12 capsule 2  . furosemide (LASIX) 20 MG tablet Take 0.5 tablets (10 mg total) by mouth daily. (Patient taking differently: Take 10 mg by mouth daily. Taking as needed) 30 tablet 1  . gabapentin (NEURONTIN) 100 MG capsule 1 cap (165m) in the morning and 2 caps (2021m po at bedtime. (Patient taking differently: Take 300 mg by mouth 3 (three) times daily. New dosing by PCP - 30057mt 8am, 300 mg at 1pm and 300 mg at bedtime) 120 capsule 1  . Lidocaine 4 % PTCH Place 1 patch onto the skin daily as needed (severe pain). Remove & Discard patch within 12 hours or as directed by MD     . ondansetron (ZOFRAN) 8 MG tablet Take 1 tablet (8 mg total) by mouth 2 (two) times daily as needed (Nausea or vomiting). 30 tablet 1  . OVER THE COUNTER MEDICATION Apply 400 mg topically daily as needed (arthritis/back pain). CBD cream    . potassium chloride SA (K-DUR,KLOR-CON) 20 MEQ tablet Take 1 tablet (20 mEq total) by mouth 2 (two) times daily. 180 tablet 0  . acyclovir (ZOVIRAX) 400 MG tablet TAKE 1 TABLET(400 MG) BY MOUTH TWICE DAILY 60 tablet 3  . albuterol (PROVENTIL HFA;VENTOLIN HFA) 108 (90 Base) MCG/ACT inhaler INHALE 2 PUFFS INTO THE LUNGS EVERY 6 HOURS AS NEEDED FOR WHEEZING OR SHORTNESS OF BREATH 18 g 1  . ANORO ELLIPTA 62.5-25 MCG/INH AEPB INHALE 1 PUFF INTO THE LUNGS DAILY 60 each 11  . folic acid (FOLVITE) 1 MG tablet TAKE 1 TABLET(1 MG) BY MOUTH DAILY (Patient not taking: Reported on 10/06/2018) 30 tablet 3  . MAGNESIUM-OXIDE 400 (241.3 Mg) MG tablet TAKE 1 TABLET BY MOUTH DAILY (Patient not taking: Reported on 10/06/2018) 30 tablet 0  . Menthol, Topical Analgesic, (BIOFREEZE EX) Apply topically as directed.    . pantoprazole (PROTONIX) 40 MG tablet Take 1 tablet (40 mg total) by mouth daily. (Patient not taking: Reported on 10/06/2018) 90 tablet 1  . potassium chloride (KLOR-CON) 20 MEQ packet Take 40 mEq by mouth 2 (two) times daily. (Patient not taking: Reported on 12/01/2018) 180 tablet  0  . spironolactone (ALDACTONE) 50 MG tablet TAKE 1 TABLET(50 MG) BY MOUTH DAILY (Patient not taking: Reported on 10/06/2018) 30  tablet 0   No current facility-administered medications for this visit.    Facility-Administered Medications Ordered in Other Visits  Medication Dose Route Frequency Provider Last Rate Last Dose  . denosumab (XGEVA) injection 120 mg  120 mg Subcutaneous Once Brunetta Genera, MD        REVIEW OF SYSTEMS:    A 10+ POINT REVIEW OF SYSTEMS WAS OBTAINED including neurology, dermatology, psychiatry, cardiac, respiratory, lymph, extremities, GI, GU, Musculoskeletal, constitutional, breasts, reproductive, HEENT.  All pertinent positives are noted in the HPI.  All others are negative.  PHYSICAL EXAMINATION: ECOG PERFORMANCE STATUS: 2 - Symptomatic, <50% confined to bed  VS reviewed  GENERAL:alert, in no acute distress and comfortable SKIN: no acute rashes, no significant lesions EYES: conjunctiva are pink and non-injected, sclera anicteric OROPHARYNX: MMM, no exudates, no oropharyngeal erythema or ulceration NECK: supple, no JVD LYMPH:  no palpable lymphadenopathy in the cervical, axillary or inguinal regions LUNGS: clear to auscultation b/l with normal respiratory effort HEART: regular rate & rhythm ABDOMEN:  normoactive bowel sounds , non tender, not distended. No palpable hepatosplenomegaly.  Extremity: no pedal edema PSYCH: alert & oriented x 3 with fluent speech NEURO: no focal motor/sensory deficits   LABORATORY DATA:  I have reviewed the data as listed  . CBC Latest Ref Rng & Units 12/01/2018 11/03/2018 10/06/2018  WBC 4.0 - 10.5 K/uL 12.8(H) 8.5 8.7  Hemoglobin 12.0 - 15.0 g/dL 13.3 14.1 14.9  Hematocrit 36.0 - 46.0 % 37.6 39.6 41.9  Platelets 150 - 400 K/uL 334 251 260    . CMP Latest Ref Rng & Units 12/01/2018 11/03/2018 10/06/2018  Glucose 70 - 99 mg/dL 134(H) 110(H) 128(H)  BUN 8 - 23 mg/dL 5(L) 5(L) 6(L)  Creatinine 0.44 - 1.00 mg/dL 0.56 0.54  0.55  Sodium 135 - 145 mmol/L 130(L) 132(L) 132(L)  Potassium 3.5 - 5.1 mmol/L 3.8 3.9 3.8  Chloride 98 - 111 mmol/L 92(L) 93(L) 95(L)  CO2 22 - 32 mmol/L _0 Calcium 8.9 - 10.3 mg/dL 8.0(L) 8.1(L) 8.3(L)  Total Protein 6.5 - 8.1 g/dL 5.0(L) 5.3(L) 5.4(L)  Total Bilirubin 0.3 - 1.2 mg/dL 2.9(H) 2.2(H) 1.8(H)  Alkaline Phos 38 - 126 U/L 588(H) 566(H) 477(H)  AST 15 - 41 U/L 50(H) 40 35  ALT 0 - 44 U/L _1 . Lab Results  Component Value Date   IRON 136 05/06/2018   TIBC 274 05/06/2018   IRONPCTSAT 50 05/06/2018   (Iron and TIBC)  Lab Results  Component Value Date   FERRITIN 1,066 (H) 05/06/2018    10/16/15 BM Bx:   03/21/18 BM Bx:    RADIOGRAPHIC STUDIES: I have personally reviewed the radiological images as listed and agreed with the findings in the report. No results found.   ASSESSMENT & PLAN:   73 y.o. Caucasian female with severe osteoporosis and vertebral compression fractures  #1 IgA kappa Multiple myeloma    M protein level increased significantly to 3 ( 1.5-->1.8 ---> 1.9-->1.8-->1.7--> 3-->3.4-->3.7---> 0.4 --> Not observed as of 05/2218. Noted to have new anemia with a hgb of 10 Labs with no signs of  hypercalcemia or renal failure. No overt focal new bone pains. Bm Bx shows 21% abnormal plasma cells. Rpt Bone Survey on 05/12/2016 -- showed no evidence of lytic lesions.  02/28/18 Bone Survey which revealed New T7 compression deformity. Multiple chronic compression deformities are noted. No definitive lytic lesions are identified.   03/25/18 PET/CT revealed No evidence of active  multiple myeloma within the skeleton. 2. No soft tissue mass to suggest plasmacytoma. 3. Aortic Atherosclerosis and Emphysema. 03/21/18 BM Bx revealed 30% atypical plasma cells   07/25/18 CT Lumbar Spine revealed Age-indeterminate mild mid L2 vertebral body compression fracture which is new compared with 08/13/2017 with less than 10% height loss. 2. Chronic T10, T11,  T12, L1, L4 and L5 vertebral body compression fractures. 3.  Aortic Atherosclerosis.  PLAN: -Discussed pt labwork today, 12/01/18; blood counts okay, mild neutrophilia. HGB normal at 13.3, PLT normal at 334k. -12/01/18 MMP - no M spike -Last available MMP from 11/03/18 did not observe an M spike -The pt shows no clinical or lab progression of her multiple myeloma at this time. -Pt was unable to tolerate [place], and will not continue maintenance treatment -Will continue watchful observation and will begin treatment again as needed -Discussed that the pt has had multiple compression fractures, and may possibly have radiculopathy as opposed to just neuropathy alone. Also discussed that the patient's neuropathy has presented after finishing treatment which is not typical of treatment related neuropathy. -Continue Vitamin B complex -Continue 370m Gabapentin TID -Folow up with PCP Dr. SCoralyn Mark-Recommend PCP consider neurology referral for further work up of neuropathic elements -No live virus vaccines. Okay to have component virus vaccines. -Did refer the pt to our Nutritional Therapist BErnestene Kiel-Recommend Prilosec OTC -Continue follow up with Dr. GCarlean Purlin GI  -Continue Vitamin D replacement and a Vitamin B complex  -Continue Acyclovir -Continue Xgeva every 4 weeks -Will see the pt back in 8 weeks  #2 liver cirrhosis with known varices. Increased bilirubin levels. Plan -Counseled on absolute alcohol cessation. -continue f/u with PCP  #3 osteoporosis with vertebral compression fractures.  Plan  -continue aggressive vitamin D replacement . -Pt has had full dental extraction and has recovered well with dental clearance to begin Xgeva.  -continue XMarchelle Folks  -continue qVertell Novakas scheduled with labs -RTC with Dr KIrene Limboin 8 weeks   All of the patients questions were answered to their apparent satisfaction. The patient knows to call the clinic with any problems, questions or  concerns.  The total time spent in the appt was 25 minutes and more than 50% was on counseling and direct patient cares.    GSullivan LoneMD MRunaway BayAAHIVMS SDesoto Memorial HospitalCChristus St Mary Outpatient Center Mid CountyHematology/Oncology Physician CS[facility] (Office):       [phone](Work cell):  [phone](Fax):           [phone] I, SBaldwin Jamaica am acting as a scribe for Dr. GSullivan Lone   .I have reviewed the above documentation for accuracy and completeness, and I agree with the above. .Brunetta GeneraMD

## 2018-12-01 ENCOUNTER — Other Ambulatory Visit: Payer: Self-pay

## 2018-12-01 ENCOUNTER — Telehealth: Payer: Self-pay | Admitting: Hematology

## 2018-12-01 ENCOUNTER — Inpatient Hospital Stay (HOSPITAL_BASED_OUTPATIENT_CLINIC_OR_DEPARTMENT_OTHER): Payer: Medicare Other | Admitting: Hematology

## 2018-12-01 ENCOUNTER — Inpatient Hospital Stay: Payer: Medicare Other

## 2018-12-01 ENCOUNTER — Inpatient Hospital Stay: Payer: Medicare Other | Attending: Hematology

## 2018-12-01 VITALS — BP 99/80 | HR 136 | Temp 98.5°F | Resp 18 | Ht 60.0 in | Wt 77.5 lb

## 2018-12-01 VITALS — HR 126

## 2018-12-01 DIAGNOSIS — M8088XS Other osteoporosis with current pathological fracture, vertebra(e), sequela: Secondary | ICD-10-CM | POA: Insufficient documentation

## 2018-12-01 DIAGNOSIS — K7030 Alcoholic cirrhosis of liver without ascites: Secondary | ICD-10-CM | POA: Insufficient documentation

## 2018-12-01 DIAGNOSIS — Z79899 Other long term (current) drug therapy: Secondary | ICD-10-CM

## 2018-12-01 DIAGNOSIS — M25552 Pain in left hip: Secondary | ICD-10-CM | POA: Diagnosis not present

## 2018-12-01 DIAGNOSIS — G629 Polyneuropathy, unspecified: Secondary | ICD-10-CM

## 2018-12-01 DIAGNOSIS — Z7189 Other specified counseling: Secondary | ICD-10-CM

## 2018-12-01 DIAGNOSIS — Z87891 Personal history of nicotine dependence: Secondary | ICD-10-CM | POA: Diagnosis not present

## 2018-12-01 DIAGNOSIS — M1990 Unspecified osteoarthritis, unspecified site: Secondary | ICD-10-CM | POA: Insufficient documentation

## 2018-12-01 DIAGNOSIS — C9001 Multiple myeloma in remission: Secondary | ICD-10-CM | POA: Insufficient documentation

## 2018-12-01 DIAGNOSIS — C9000 Multiple myeloma not having achieved remission: Secondary | ICD-10-CM

## 2018-12-01 DIAGNOSIS — M545 Low back pain: Secondary | ICD-10-CM | POA: Insufficient documentation

## 2018-12-01 LAB — CBC WITH DIFFERENTIAL/PLATELET
Abs Immature Granulocytes: 0.13 10*3/uL — ABNORMAL HIGH (ref 0.00–0.07)
Basophils Absolute: 0.1 10*3/uL (ref 0.0–0.1)
Basophils Relative: 1 %
Eosinophils Absolute: 0.1 10*3/uL (ref 0.0–0.5)
Eosinophils Relative: 0 %
HCT: 37.6 % (ref 36.0–46.0)
Hemoglobin: 13.3 g/dL (ref 12.0–15.0)
Immature Granulocytes: 1 %
Lymphocytes Relative: 8 %
Lymphs Abs: 1 10*3/uL (ref 0.7–4.0)
MCH: 37.4 pg — ABNORMAL HIGH (ref 26.0–34.0)
MCHC: 35.4 g/dL (ref 30.0–36.0)
MCV: 105.6 fL — ABNORMAL HIGH (ref 80.0–100.0)
Monocytes Absolute: 1.1 10*3/uL — ABNORMAL HIGH (ref 0.1–1.0)
Monocytes Relative: 9 %
Neutro Abs: 10.4 10*3/uL — ABNORMAL HIGH (ref 1.7–7.7)
Neutrophils Relative %: 81 %
Platelets: 334 10*3/uL (ref 150–400)
RBC: 3.56 MIL/uL — ABNORMAL LOW (ref 3.87–5.11)
RDW: 14.4 % (ref 11.5–15.5)
WBC: 12.8 10*3/uL — ABNORMAL HIGH (ref 4.0–10.5)
nRBC: 0 % (ref 0.0–0.2)

## 2018-12-01 LAB — CMP (CANCER CENTER ONLY)
ALT: 28 U/L (ref 0–44)
AST: 50 U/L — ABNORMAL HIGH (ref 15–41)
Albumin: 2.6 g/dL — ABNORMAL LOW (ref 3.5–5.0)
Alkaline Phosphatase: 588 U/L — ABNORMAL HIGH (ref 38–126)
Anion gap: 11 (ref 5–15)
BUN: 5 mg/dL — ABNORMAL LOW (ref 8–23)
CO2: 27 mmol/L (ref 22–32)
Calcium: 8 mg/dL — ABNORMAL LOW (ref 8.9–10.3)
Chloride: 92 mmol/L — ABNORMAL LOW (ref 98–111)
Creatinine: 0.56 mg/dL (ref 0.44–1.00)
GFR, Est AFR Am: >60 mL/min
GFR, Estimated: >60 mL/min
Glucose, Bld: 134 mg/dL — ABNORMAL HIGH (ref 70–99)
Potassium: 3.8 mmol/L (ref 3.5–5.1)
Sodium: 130 mmol/L — ABNORMAL LOW (ref 135–145)
Total Bilirubin: 2.9 mg/dL — ABNORMAL HIGH (ref 0.3–1.2)
Total Protein: 5 g/dL — ABNORMAL LOW (ref 6.5–8.1)

## 2018-12-01 MED ORDER — DENOSUMAB 120 MG/1.7ML ~~LOC~~ SOLN
120.0000 mg | Freq: Once | SUBCUTANEOUS | Status: AC
  Administered 2018-12-01: 120 mg via SUBCUTANEOUS

## 2018-12-01 MED ORDER — DENOSUMAB 120 MG/1.7ML ~~LOC~~ SOLN
SUBCUTANEOUS | Status: AC
  Filled 2018-12-01: qty 1.7

## 2018-12-01 NOTE — Progress Notes (Signed)
Ok to treat with calcium 8.0 per Dr. Irene Limbo

## 2018-12-01 NOTE — Telephone Encounter (Signed)
Scheduled appt per 5/7 los. °

## 2018-12-02 ENCOUNTER — Encounter: Payer: Self-pay | Admitting: Hematology

## 2018-12-02 LAB — MULTIPLE MYELOMA PANEL, SERUM
Albumin SerPl Elph-Mcnc: 2.7 g/dL — ABNORMAL LOW (ref 2.9–4.4)
Albumin/Glob SerPl: 1.3 (ref 0.7–1.7)
Alpha 1: 0.3 g/dL (ref 0.0–0.4)
Alpha2 Glob SerPl Elph-Mcnc: 0.6 g/dL (ref 0.4–1.0)
B-Globulin SerPl Elph-Mcnc: 0.5 g/dL — ABNORMAL LOW (ref 0.7–1.3)
Gamma Glob SerPl Elph-Mcnc: 0.7 g/dL (ref 0.4–1.8)
Globulin, Total: 2.1 g/dL — ABNORMAL LOW (ref 2.2–3.9)
IgA: 202 mg/dL (ref 87–352)
IgG (Immunoglobin G), Serum: 857 mg/dL (ref 586–1602)
IgM (Immunoglobulin M), Srm: 20 mg/dL — ABNORMAL LOW (ref 26–217)
Total Protein ELP: 4.8 g/dL — ABNORMAL LOW (ref 6.0–8.5)

## 2018-12-02 LAB — KAPPA/LAMBDA LIGHT CHAINS
Kappa free light chain: 7.8 mg/L (ref 3.3–19.4)
Kappa, lambda light chain ratio: 0.72 (ref 0.26–1.65)
Lambda free light chains: 10.9 mg/L (ref 5.7–26.3)

## 2018-12-06 ENCOUNTER — Other Ambulatory Visit: Payer: Self-pay | Admitting: Hematology

## 2018-12-06 DIAGNOSIS — M818 Other osteoporosis without current pathological fracture: Secondary | ICD-10-CM

## 2018-12-06 DIAGNOSIS — C9001 Multiple myeloma in remission: Secondary | ICD-10-CM

## 2018-12-06 DIAGNOSIS — E440 Moderate protein-calorie malnutrition: Secondary | ICD-10-CM

## 2018-12-07 ENCOUNTER — Telehealth: Payer: Self-pay | Admitting: *Deleted

## 2018-12-07 NOTE — Telephone Encounter (Signed)
Contacted patient to inform that Dr. Irene Limbo had placed referral order for in home PT.

## 2018-12-21 ENCOUNTER — Telehealth: Payer: Self-pay | Admitting: *Deleted

## 2018-12-21 ENCOUNTER — Other Ambulatory Visit: Payer: Self-pay

## 2018-12-21 ENCOUNTER — Other Ambulatory Visit: Payer: Self-pay | Admitting: Internal Medicine

## 2018-12-21 ENCOUNTER — Encounter: Payer: Self-pay | Admitting: Internal Medicine

## 2018-12-21 ENCOUNTER — Ambulatory Visit (INDEPENDENT_AMBULATORY_CARE_PROVIDER_SITE_OTHER): Payer: Medicare Other | Admitting: Internal Medicine

## 2018-12-21 DIAGNOSIS — K7031 Alcoholic cirrhosis of liver with ascites: Secondary | ICD-10-CM

## 2018-12-21 DIAGNOSIS — E43 Unspecified severe protein-calorie malnutrition: Secondary | ICD-10-CM

## 2018-12-21 DIAGNOSIS — F1010 Alcohol abuse, uncomplicated: Secondary | ICD-10-CM

## 2018-12-21 DIAGNOSIS — R609 Edema, unspecified: Secondary | ICD-10-CM

## 2018-12-21 MED ORDER — SPIRONOLACTONE 50 MG PO TABS: 50.0000 mg | ORAL_TABLET | Freq: Every day | ORAL | 1 refills | Status: DC

## 2018-12-21 NOTE — Assessment & Plan Note (Addendum)
Sounds like volume overloaded again Restart diuretics Stay off K-Dur for now - may not need w/ furosemid + spironolactone Abd [place] Possible paracentesis pending results Check INR Alcohol cessation advised Further plans pending that - will benefit from in-office visit

## 2018-12-21 NOTE — Progress Notes (Signed)
TELEHEALTH ENCOUNTER IN SETTING OF COVID-19 PANDEMIC - REQUESTED BY PATIENT SERVICE PROVIDED BY TELEMEDECINE - TYPE: telephone - AV not possible PATIENT LOCATION: home PATIENT HAS CONSENTED TO TELEHEALTH VISIT PROVIDER LOCATION: OFFICE REFERRING PROVIDER:N?A PCP is Vicenta Aly, FNP PARTICIPANTS OTHER THAN PATIENT:wife Gerda Diss TIME SPENT ON CALL:16 minutes   Cassidy Barrett 73 y.o. 06-24-1946 [id number]  Assessment & Plan:   Alcoholic cirrhosis Sounds like volume overloaded again Restart diuretics Stay off K-Dur for now - may not need w/ furosemid + spironolactone Abd [place] Possible paracentesis pending results Check INR Alcohol cessation advised Further plans pending that - will benefit from in-office visit  Alcohol abuse Continues to be a problem though reports less  Edema Recurrent by history  Protein-calorie malnutrition, severe Suspect this remains an issue Albumin declining and per history seems likely Low protein status may be partial etiology for edema/ascites also   Cc:Vicenta Aly, FNP   Subjective:   Chief Complaint: swelling  HPI Bonnita Nasuti and [name] report an increase in Abdominal girth and LE edema in past week or so. Had been on spironolactone and furosemide for ascites/edema - but stopped when swelling was gone. In addition to increased abd girth thighs and popliteal fossae are swollen bilaterally and so are legs and "now she has hips". No sig pain. Stable DOE.  Weak and difficulty getting up out of chair, increasing neuropathy pain and associated weakness from chemoTx they report - getting home health, PT and OT - Dr. Irene Limbo has set up. Multiple myelome in remission - getting osteoporosis Tx/infusions  Last labs showed elevated alk phos and bili with declining albumin, Na 130. Nl Hgb - that was 5/7. Due for labs again in about 10 days as will get another multiple myeloma infusion. Last [place] or other cross sectional abd imaging 01/2018 w/ shrunken liver and  ascites. EGD 02/2018 NL NO VARICES   Despite fluid gain weight decreased Wt Readings from Last 3 Encounters:  12/21/18 72 lb 12.8 oz (33 kg)  12/01/18 77 lb 8 oz (35.2 kg)  10/06/18 80 lb 11.2 oz (36.6 kg)    Chronic anorexia persists.    Allergies  Allergen Reactions  . Bee Venom Swelling  . Benadryl [Diphenhydramine]     "climbs the walls"  . Hydrocodone-Acetaminophen     "flips me out"  . Oxycodone Other (See Comments)    Anxiety, restless  . Tape     Skin sensitive to tape  . Valium [Diazepam] Other (See Comments)    Pt states makes her crazy   Current Meds  Medication Sig  . acyclovir (ZOVIRAX) 400 MG tablet TAKE 1 TABLET(400 MG) BY MOUTH TWICE DAILY  . albuterol (PROVENTIL HFA;VENTOLIN HFA) 108 (90 Base) MCG/ACT inhaler INHALE 2 PUFFS INTO THE LUNGS EVERY 6 HOURS AS NEEDED FOR WHEEZING OR SHORTNESS OF BREATH  . ANORO ELLIPTA 62.5-25 MCG/INH AEPB INHALE 1 PUFF INTO THE LUNGS DAILY  . diclofenac sodium (VOLTAREN) 1 % GEL APP 1 GRAM AA 2 TO 3 XD  . ergocalciferol (VITAMIN D2) 50000 units capsule Take 1 capsule (50,000 Units total) by mouth once a week.  . furosemide (LASIX) 20 MG tablet Take 20 mg by mouth. TAKE 1/2 TABLET TO ONE TABLET DAILY AS NEEDED DEPENDING ON SWELLING  . gabapentin (NEURONTIN) 300 MG capsule Take 1 capsule by mouth 3 (three) times daily.  . Lidocaine 4 % PTCH Place 1 patch onto the skin daily as needed (severe pain). Remove & Discard patch within 12 hours or  as directed by MD   . ondansetron (ZOFRAN) 8 MG tablet Take 1 tablet (8 mg total) by mouth 2 (two) times daily as needed (Nausea or vomiting).  Marland Kitchen OVER THE COUNTER MEDICATION Apply 400 mg topically daily as needed (arthritis/back pain). CBD cream  . OVER THE COUNTER MEDICATION MAGNESIUM SPRAY 12MG--SPRAY ON FEET AS NEEDED FOR NEUROPATHY  . potassium chloride SA (K-DUR,KLOR-CON) 20 MEQ tablet Take 1 tablet (20 mEq total) by mouth 2 (two) times daily.  Marland Kitchen spironolactone (ALDACTONE) 50 MG tablet Take  1 tablet (50 mg total) by mouth daily.  . [DISCONTINUED] furosemide (LASIX) 20 MG tablet Take 0.5 tablets (10 mg total) by mouth daily. (Patient taking differently: Take 20 mg by mouth daily. Taking as needed - HALF TO ONE TABLET DEPENDING ON HER SWELLING)  . [DISCONTINUED] gabapentin (NEURONTIN) 100 MG capsule Take 300 mg by mouth 3 (three) times daily.  . [DISCONTINUED] spironolactone (ALDACTONE) 50 MG tablet TAKE 1 TABLET(50 MG) BY MOUTH DAILY   Past Medical History:  Diagnosis Date  . Alcohol abuse   . Arthritis   . Ascites   . Atherosclerosis of aorta ([place])   . Cholelithiasis   . Cirrhosis ([place])   . Complication of anesthesia    Difficult time waking up after having biospy with colonoscopy  . 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 Compression fracture of lumbar vertebra ([facility])     L1 and L2  . Emphysema of lung ([place])   . History of multiple myeloma    smoldering  . Hx of adenomatous polyp of rectum 05/21/2014  . Hyponatremia   . Macrocytic anemia   . 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 Osteoporosis   . Pneumonia 04/2014  . Tobacco abuse   . Tubular adenoma   . Vertebral compression fracture [facility])    Past Surgical History:  Procedure Laterality Date  . COLONOSCOPY N/A 05/24/2014   Procedure: COLONOSCOPY;  Surgeon: Gatha Mayer, MD;  [facility]: [facility];  Service: Endoscopy;  Laterality: N/A;  MAC if possible ultraslim colonoscope and gastroscope needed  . FLEXIBLE SIGMOIDOSCOPY N/A 05/21/2014   Procedure: FLEXIBLE SIGMOIDOSCOPY;  Surgeon: Gatha Mayer, MD;  [facility];  Service: Endoscopy;  Laterality: N/A;  . KYPHOPLASTY N/A 12/12/2014   Procedure: KYPHOPLASTY;  Surgeon: Phylliss Bob, MD;  Location: [facility];  Service: Orthopedics;  Laterality: N/A;  T10 kyphoplasty  . MANDIBLE FRACTURE SURGERY     X 2  . TONSILLECTOMY     Social History   Social History Narrative   She is retired from [employer]   Married to [employer]    Uses alcohol heavy in the past,   Former smoker quit in 2019   No substance abuse reported   family history includes ALS in her brother; COPD in her sister; Heart disease in her mother; Other in her father.   Review of Systems As per HPI

## 2018-12-21 NOTE — Assessment & Plan Note (Signed)
Continues to be a problem though reports less

## 2018-12-21 NOTE — Telephone Encounter (Signed)
Cassidy Barrett, OT w/[facility] contacted office: Asks for Dr. Irene Limbo to consider ordering home OT for patient - 2 x week for 3 weeks.  Dr. Irene Limbo given request

## 2018-12-21 NOTE — Telephone Encounter (Signed)
Per Dr. Irene Limbo, verbal order given for OT as specified in earlier call. Attempted to contact Butler Denmark, OT @ [phone] - left voice mail with verbal order and office number to contact for questions. Left office fax number as well on voice mail, if signed orders are needed.

## 2018-12-21 NOTE — Assessment & Plan Note (Signed)
Recurrent by history

## 2018-12-21 NOTE — Assessment & Plan Note (Addendum)
Suspect this remains an issue Albumin declining and per history seems likely Low protein status may be partial etiology for edema/ascites also

## 2018-12-21 NOTE — Patient Instructions (Addendum)
As we discussed today I recommend the following:  1) Take one 20 mg furosemide every AM 2) Take one 50 mg spironolactone every AM 3) Do not take potassium right now 4) Keep trying to stop drinking alcohol 5) We will schedule a complete abdominal ultrasound on a Monday, Tuesday or Friday to look at the liver and see if fluid is in the abdomen 6) Home health will draw a PT/INR test to see the blood clotting function 7) Once I see results will advise further   You have been scheduled for a complete abdominal ultrasound at [facility] on 01/06/2019 at 9:30AM. Please arrive 20 minutes prior to your appointment for registration. Make certain not to have anything to eat or drink anything after midnight. Should you need to reschedule your appointment, please contact them at [phone]. This test typically takes about 30 minutes to perform.  They are located at [address]  [place]     I appreciate the opportunity to care for you. Silvano Rusk, MD, [facility]

## 2018-12-26 ENCOUNTER — Other Ambulatory Visit: Payer: Self-pay | Admitting: Internal Medicine

## 2018-12-26 MED ORDER — ALBUTEROL SULFATE HFA 108 (90 BASE) MCG/ACT IN AERS: INHALATION_SPRAY | RESPIRATORY_TRACT | 1 refills | Status: AC

## 2018-12-29 ENCOUNTER — Inpatient Hospital Stay: Payer: Medicare Other

## 2018-12-29 ENCOUNTER — Telehealth: Payer: Self-pay | Admitting: *Deleted

## 2018-12-29 ENCOUNTER — Inpatient Hospital Stay (HOSPITAL_COMMUNITY)
Admission: EM | Admit: 2018-12-29 | Discharge: 2019-01-02 | DRG: 640 | Disposition: A | Discharge status: Home / Self Care | Payer: Medicare Other | Source: Ambulatory Visit | Attending: Internal Medicine | Admitting: Internal Medicine

## 2018-12-29 ENCOUNTER — Other Ambulatory Visit: Payer: Self-pay

## 2018-12-29 ENCOUNTER — Inpatient Hospital Stay: Payer: Medicare Other | Attending: Hematology

## 2018-12-29 ENCOUNTER — Encounter (HOSPITAL_COMMUNITY): Payer: Self-pay

## 2018-12-29 ENCOUNTER — Emergency Department (HOSPITAL_COMMUNITY): Payer: Medicare Other

## 2018-12-29 DIAGNOSIS — Z885 Allergy status to narcotic agent status: Secondary | ICD-10-CM

## 2018-12-29 DIAGNOSIS — F17210 Nicotine dependence, cigarettes, uncomplicated: Secondary | ICD-10-CM | POA: Diagnosis present

## 2018-12-29 DIAGNOSIS — Z91030 Bee allergy status: Secondary | ICD-10-CM

## 2018-12-29 DIAGNOSIS — Z681 Body mass index (BMI) 19 or less, adult: Secondary | ICD-10-CM

## 2018-12-29 DIAGNOSIS — G8929 Other chronic pain: Secondary | ICD-10-CM | POA: Diagnosis present

## 2018-12-29 DIAGNOSIS — X58XXXA Exposure to other specified factors, initial encounter: Secondary | ICD-10-CM | POA: Diagnosis present

## 2018-12-29 DIAGNOSIS — Z888 Allergy status to other drugs, medicaments and biological substances status: Secondary | ICD-10-CM

## 2018-12-29 DIAGNOSIS — C9000 Multiple myeloma not having achieved remission: Secondary | ICD-10-CM | POA: Diagnosis present

## 2018-12-29 DIAGNOSIS — Z66 Do not resuscitate: Secondary | ICD-10-CM | POA: Diagnosis present

## 2018-12-29 DIAGNOSIS — J9811 Atelectasis: Secondary | ICD-10-CM | POA: Diagnosis present

## 2018-12-29 DIAGNOSIS — Z91048 Other nonmedicinal substance allergy status: Secondary | ICD-10-CM

## 2018-12-29 DIAGNOSIS — Z7951 Long term (current) use of inhaled steroids: Secondary | ICD-10-CM

## 2018-12-29 DIAGNOSIS — R0602 Shortness of breath: Secondary | ICD-10-CM | POA: Diagnosis not present

## 2018-12-29 DIAGNOSIS — K7460 Unspecified cirrhosis of liver: Secondary | ICD-10-CM

## 2018-12-29 DIAGNOSIS — R188 Other ascites: Secondary | ICD-10-CM | POA: Diagnosis not present

## 2018-12-29 DIAGNOSIS — E876 Hypokalemia: Secondary | ICD-10-CM | POA: Diagnosis present

## 2018-12-29 DIAGNOSIS — Z1159 Encounter for screening for other viral diseases: Secondary | ICD-10-CM | POA: Diagnosis not present

## 2018-12-29 DIAGNOSIS — J982 Interstitial emphysema: Secondary | ICD-10-CM | POA: Diagnosis present

## 2018-12-29 DIAGNOSIS — E860 Dehydration: Secondary | ICD-10-CM | POA: Diagnosis present

## 2018-12-29 DIAGNOSIS — R6510 Systemic inflammatory response syndrome (SIRS) of non-infectious origin without acute organ dysfunction: Secondary | ICD-10-CM

## 2018-12-29 DIAGNOSIS — I700 Atherosclerosis of aorta: Secondary | ICD-10-CM | POA: Diagnosis present

## 2018-12-29 DIAGNOSIS — E871 Hypo-osmolality and hyponatremia: Secondary | ICD-10-CM | POA: Diagnosis not present

## 2018-12-29 DIAGNOSIS — Z8719 Personal history of other diseases of the digestive system: Secondary | ICD-10-CM

## 2018-12-29 DIAGNOSIS — M1990 Unspecified osteoarthritis, unspecified site: Secondary | ICD-10-CM | POA: Diagnosis present

## 2018-12-29 DIAGNOSIS — S8011XA Contusion of right lower leg, initial encounter: Secondary | ICD-10-CM | POA: Diagnosis present

## 2018-12-29 DIAGNOSIS — D539 Nutritional anemia, unspecified: Secondary | ICD-10-CM | POA: Diagnosis present

## 2018-12-29 DIAGNOSIS — K7030 Alcoholic cirrhosis of liver without ascites: Secondary | ICD-10-CM | POA: Diagnosis present

## 2018-12-29 DIAGNOSIS — Z79899 Other long term (current) drug therapy: Secondary | ICD-10-CM

## 2018-12-29 DIAGNOSIS — R64 Cachexia: Secondary | ICD-10-CM | POA: Diagnosis present

## 2018-12-29 DIAGNOSIS — M810 Age-related osteoporosis without current pathological fracture: Secondary | ICD-10-CM | POA: Diagnosis present

## 2018-12-29 DIAGNOSIS — R52 Pain, unspecified: Secondary | ICD-10-CM | POA: Diagnosis not present

## 2018-12-29 DIAGNOSIS — K8020 Calculus of gallbladder without cholecystitis without obstruction: Secondary | ICD-10-CM | POA: Diagnosis present

## 2018-12-29 DIAGNOSIS — I5032 Chronic diastolic (congestive) heart failure: Secondary | ICD-10-CM | POA: Diagnosis present

## 2018-12-29 DIAGNOSIS — E43 Unspecified severe protein-calorie malnutrition: Secondary | ICD-10-CM | POA: Diagnosis present

## 2018-12-29 DIAGNOSIS — Z720 Tobacco use: Secondary | ICD-10-CM | POA: Diagnosis present

## 2018-12-29 DIAGNOSIS — R600 Localized edema: Secondary | ICD-10-CM

## 2018-12-29 DIAGNOSIS — F1010 Alcohol abuse, uncomplicated: Secondary | ICD-10-CM | POA: Diagnosis present

## 2018-12-29 DIAGNOSIS — Z825 Family history of asthma and other chronic lower respiratory diseases: Secondary | ICD-10-CM

## 2018-12-29 DIAGNOSIS — F1020 Alcohol dependence, uncomplicated: Secondary | ICD-10-CM | POA: Diagnosis present

## 2018-12-29 DIAGNOSIS — G629 Polyneuropathy, unspecified: Secondary | ICD-10-CM | POA: Diagnosis present

## 2018-12-29 DIAGNOSIS — I959 Hypotension, unspecified: Secondary | ICD-10-CM | POA: Diagnosis present

## 2018-12-29 DIAGNOSIS — K7031 Alcoholic cirrhosis of liver with ascites: Secondary | ICD-10-CM | POA: Diagnosis present

## 2018-12-29 DIAGNOSIS — F10188 Alcohol abuse with other alcohol-induced disorder: Secondary | ICD-10-CM | POA: Diagnosis not present

## 2018-12-29 DIAGNOSIS — R17 Unspecified jaundice: Secondary | ICD-10-CM

## 2018-12-29 DIAGNOSIS — S8012XA Contusion of left lower leg, initial encounter: Secondary | ICD-10-CM | POA: Diagnosis present

## 2018-12-29 DIAGNOSIS — J90 Pleural effusion, not elsewhere classified: Secondary | ICD-10-CM | POA: Diagnosis not present

## 2018-12-29 DIAGNOSIS — Z8249 Family history of ischemic heart disease and other diseases of the circulatory system: Secondary | ICD-10-CM

## 2018-12-29 LAB — CMP (CANCER CENTER ONLY)
ALT: 15 U/L (ref 0–44)
AST: 32 U/L (ref 15–41)
Albumin: 2.2 g/dL — ABNORMAL LOW (ref 3.5–5.0)
Alkaline Phosphatase: 457 U/L — ABNORMAL HIGH (ref 38–126)
Anion gap: 11 (ref 5–15)
BUN: <4 mg/dL — ABNORMAL LOW (ref 8–23)
CO2: 31 mmol/L (ref 22–32)
Calcium: 7.3 mg/dL — ABNORMAL LOW (ref 8.9–10.3)
Chloride: 83 mmol/L — ABNORMAL LOW (ref 98–111)
Creatinine: 0.49 mg/dL (ref 0.44–1.00)
GFR, Est AFR Am: >60 mL/min (ref >60)
GFR, Estimated: >60 mL/min (ref >60)
Glucose, Bld: 136 mg/dL — ABNORMAL HIGH (ref 70–99)
Potassium: 2.8 mmol/L — CL (ref 3.5–5.1)
Sodium: 125 mmol/L — ABNORMAL LOW (ref 135–145)
Total Bilirubin: 3.1 mg/dL — ABNORMAL HIGH (ref 0.3–1.2)
Total Protein: 4.5 g/dL — ABNORMAL LOW (ref 6.5–8.1)

## 2018-12-29 LAB — BASIC METABOLIC PANEL
Anion gap: 12 (ref 5–15)
BUN: 5 mg/dL — ABNORMAL LOW (ref 8–23)
CO2: 26 mmol/L (ref 22–32)
Calcium: 6.9 mg/dL — ABNORMAL LOW (ref 8.9–10.3)
Chloride: 88 mmol/L — ABNORMAL LOW (ref 98–111)
Creatinine, Ser: 0.33 mg/dL — ABNORMAL LOW (ref 0.44–1.00)
GFR calc Af Amer: >60 mL/min (ref >60)
GFR calc non Af Amer: >60 mL/min (ref >60)
Glucose, Bld: 133 mg/dL — ABNORMAL HIGH (ref 70–99)
Potassium: 3.6 mmol/L (ref 3.5–5.1)
Sodium: 126 mmol/L — ABNORMAL LOW (ref 135–145)

## 2018-12-29 LAB — BRAIN NATRIURETIC PEPTIDE: B Natriuretic Peptide: 100.1 pg/mL — ABNORMAL HIGH (ref 0.0–100.0)

## 2018-12-29 LAB — CBC WITH DIFFERENTIAL/PLATELET
Basophils Absolute: 0 10*3/uL (ref 0.0–0.1)
Basophils Relative: 0 %
Eosinophils Absolute: 0 10*3/uL (ref 0.0–0.5)
Eosinophils Relative: 0 %
HCT: 35.1 % — ABNORMAL LOW (ref 36.0–46.0)
Hemoglobin: 13.1 g/dL (ref 12.0–15.0)
Lymphocytes Relative: 5 %
Lymphs Abs: 0.6 10*3/uL — ABNORMAL LOW (ref 0.7–4.0)
MCH: 35.6 pg — ABNORMAL HIGH (ref 26.0–34.0)
MCHC: 37.3 g/dL — ABNORMAL HIGH (ref 30.0–36.0)
MCV: 95.4 fL (ref 80.0–100.0)
Monocytes Absolute: 1.5 10*3/uL — ABNORMAL HIGH (ref 0.1–1.0)
Monocytes Relative: 11 %
Neutro Abs: 10.8 10*3/uL — ABNORMAL HIGH (ref 1.7–7.7)
Neutrophils Relative %: 83 %
Platelets: 275 10*3/uL (ref 150–400)
RBC: 3.68 MIL/uL — ABNORMAL LOW (ref 3.87–5.11)
RDW: 15 % (ref 11.5–15.5)
WBC: 12.9 10*3/uL — ABNORMAL HIGH (ref 4.0–10.5)
nRBC: 0 % (ref 0.0–0.2)

## 2018-12-29 LAB — MAGNESIUM: Magnesium: 1.7 mg/dL (ref 1.7–2.4)

## 2018-12-29 LAB — PROCALCITONIN: Procalcitonin: 0.15 ng/mL

## 2018-12-29 LAB — SARS CORONAVIRUS 2 BY RT PCR (HOSPITAL ORDER, PERFORMED IN ~~LOC~~ HOSPITAL LAB): SARS Coronavirus 2: NEGATIVE

## 2018-12-29 LAB — LACTIC ACID, PLASMA
Lactic Acid, Venous: 3.3 mmol/L (ref 0.5–1.9)
Lactic Acid, Venous: 3.9 mmol/L (ref 0.5–1.9)

## 2018-12-29 LAB — TROPONIN I: Troponin I: <0.03 ng/mL (ref <0.03)

## 2018-12-29 MED ORDER — VITAMIN B-1 100 MG PO TABS
100.0000 mg | ORAL_TABLET | Freq: Every day | ORAL | Status: DC
  Administered 2018-12-29 – 2019-01-02 (×5): 100 mg via ORAL
  Filled 2018-12-29 (×5): qty 1

## 2018-12-29 MED ORDER — SODIUM CHLORIDE 0.9 % IV SOLN
2.0000 g | Freq: Once | INTRAVENOUS | Status: AC
  Administered 2018-12-29: 2 g via INTRAVENOUS
  Filled 2018-12-29: qty 2

## 2018-12-29 MED ORDER — LORAZEPAM 2 MG/ML IJ SOLN: 1.0000 mg | Freq: Four times a day (QID) | INTRAMUSCULAR | Status: AC | PRN

## 2018-12-29 MED ORDER — VANCOMYCIN HCL IN DEXTROSE 750-5 MG/150ML-% IV SOLN
750.0000 mg | Freq: Once | INTRAVENOUS | Status: AC
  Administered 2018-12-30: 750 mg via INTRAVENOUS
  Filled 2018-12-29: qty 150

## 2018-12-29 MED ORDER — LIDOCAINE 5 % EX PTCH
1.0000 | MEDICATED_PATCH | Freq: Every day | CUTANEOUS | Status: DC | PRN
  Administered 2018-12-30: 1 via TRANSDERMAL
  Filled 2018-12-29 (×2): qty 1

## 2018-12-29 MED ORDER — GABAPENTIN 300 MG PO CAPS
300.0000 mg | ORAL_CAPSULE | Freq: Once | ORAL | Status: AC
  Administered 2018-12-29: 300 mg via ORAL
  Filled 2018-12-29: qty 1

## 2018-12-29 MED ORDER — GABAPENTIN 300 MG PO CAPS
300.0000 mg | ORAL_CAPSULE | Freq: Three times a day (TID) | ORAL | Status: DC
  Administered 2018-12-30 – 2019-01-02 (×10): 300 mg via ORAL
  Filled 2018-12-29 (×10): qty 1

## 2018-12-29 MED ORDER — FOLIC ACID 1 MG PO TABS
1.0000 mg | ORAL_TABLET | Freq: Every day | ORAL | Status: DC
  Administered 2018-12-29 – 2019-01-02 (×5): 1 mg via ORAL
  Filled 2018-12-29 (×5): qty 1

## 2018-12-29 MED ORDER — THIAMINE HCL 100 MG/ML IJ SOLN
100.0000 mg | Freq: Every day | INTRAMUSCULAR | Status: DC
  Filled 2018-12-29: qty 2

## 2018-12-29 MED ORDER — POTASSIUM CHLORIDE CRYS ER 20 MEQ PO TBCR
20.0000 meq | EXTENDED_RELEASE_TABLET | Freq: Once | ORAL | Status: AC
  Administered 2018-12-29: 20 meq via ORAL
  Filled 2018-12-29: qty 1

## 2018-12-29 MED ORDER — ACETAMINOPHEN 325 MG PO TABS
650.0000 mg | ORAL_TABLET | Freq: Four times a day (QID) | ORAL | Status: DC | PRN
  Administered 2018-12-30: 325 mg via ORAL
  Filled 2018-12-29 (×2): qty 2

## 2018-12-29 MED ORDER — ALBUTEROL SULFATE HFA 108 (90 BASE) MCG/ACT IN AERS: 2.0000 | INHALATION_SPRAY | Freq: Four times a day (QID) | RESPIRATORY_TRACT | Status: DC | PRN

## 2018-12-29 MED ORDER — POTASSIUM CHLORIDE 10 MEQ/100ML IV SOLN
10.0000 meq | INTRAVENOUS | Status: AC
  Administered 2018-12-29 (×2): 10 meq via INTRAVENOUS
  Filled 2018-12-29 (×2): qty 100

## 2018-12-29 MED ORDER — LORAZEPAM 1 MG PO TABS: 1.0000 mg | ORAL_TABLET | Freq: Four times a day (QID) | ORAL | Status: AC | PRN

## 2018-12-29 MED ORDER — UMECLIDINIUM-VILANTEROL 62.5-25 MCG/INH IN AEPB
1.0000 | INHALATION_SPRAY | Freq: Every day | RESPIRATORY_TRACT | Status: DC
  Administered 2018-12-30 – 2019-01-02 (×4): 1 via RESPIRATORY_TRACT
  Filled 2018-12-29: qty 14

## 2018-12-29 MED ORDER — DENOSUMAB 120 MG/1.7ML ~~LOC~~ SOLN
SUBCUTANEOUS | Status: AC
  Filled 2018-12-29: qty 1.7

## 2018-12-29 MED ORDER — SODIUM CHLORIDE 0.9 % IV BOLUS
500.0000 mL | Freq: Once | INTRAVENOUS | Status: AC
  Administered 2018-12-30: 500 mL via INTRAVENOUS

## 2018-12-29 MED ORDER — ACYCLOVIR 400 MG PO TABS
400.0000 mg | ORAL_TABLET | Freq: Two times a day (BID) | ORAL | Status: DC
  Administered 2018-12-29 – 2019-01-02 (×8): 400 mg via ORAL
  Filled 2018-12-29 (×8): qty 1

## 2018-12-29 MED ORDER — ACETAMINOPHEN 650 MG RE SUPP: 650.0000 mg | Freq: Four times a day (QID) | RECTAL | Status: DC | PRN

## 2018-12-29 MED ORDER — ADULT MULTIVITAMIN W/MINERALS CH
1.0000 | ORAL_TABLET | Freq: Every day | ORAL | Status: DC
  Administered 2018-12-29 – 2019-01-02 (×5): 1 via ORAL
  Filled 2018-12-29 (×5): qty 1

## 2018-12-29 MED ORDER — SODIUM CHLORIDE 0.9 % IV SOLN
INTRAVENOUS | Status: DC
  Administered 2018-12-29: 22:00:00 via INTRAVENOUS

## 2018-12-29 NOTE — ED Notes (Signed)
CRITICAL VALUE STICKER  CRITICAL VALUE: Latic Acid 3.3  RECEIVER (on-site recipient of call): Ovid Curd   DATE & TIME NOTIFIED: 12/29/18  MESSENGER (representative from lab): Ovid Curd  MD NOTIFIED: Dr. Ralene Bathe  TIME OF NOTIFICATION: 1921  RESPONSE: awaiting orders

## 2018-12-29 NOTE — ED Triage Notes (Signed)
Patient has multiple abnormal labs. Patient was sent from the [facility].   Patient has weeping fluid from the right lower leg that is blood-tinged in color.

## 2018-12-29 NOTE — Telephone Encounter (Signed)
Patient here for lab and injection only. Dr. Irene Limbo informed of lab results.  [facility] held today.  Dr. Irene Limbo reviewed CMP results. He asked that patient be given copy of lab results and contact PCP to be seen today, taking lab results with her. If PCP not able to see patient today, he recommended patient go to ED for evaluation.  Met patient in lobby to explain Dr. Grier Mitts directions.  Patient in wheelchair. She states she is experiencing pain in legs and arms and does not feel well. She verbalized agreement to plan and Dr.Kale's recomendation, stating she would call PCP office and if they could not see her, she would ask Home Health CNA who was picking her up to take her to the ED.

## 2018-12-29 NOTE — Telephone Encounter (Signed)
Received call report from [facility].  "Today's K+ = 2.8."  Secure Chat message sent with results.

## 2018-12-29 NOTE — ED Notes (Signed)
ED TO INPATIENT HANDOFF REPORT  ED Nurse Name and Phone #: [phone]  S Name/Age/Gender Cassidy Barrett 73 y.o. female Room/Bed: WA23/WA23  Code Status   Code Status: Full Code  Home/SNF/Other Home Patient oriented to: self, place, time and situation Is this baseline? Yes   Triage Complete: Triage complete  Chief Complaint Cancer pt/ Abnormal Lab  Triage Note Patient has multiple abnormal labs. Patient was sent from the [facility].   Patient has weeping fluid from the right lower leg that is blood-tinged in color.   Allergies Allergies  Allergen Reactions  . Bee Venom Swelling  . Benadryl [Diphenhydramine]     "climbs the walls"  . Hydrocodone-Acetaminophen     "flips me out"  . 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 Oxycodone Other (See Comments)    Anxiety, restless  . Tape     Skin sensitive to tape  . Tramadol Diarrhea and Other (See Comments)    All listed side effects, except seizures  . Valium [Diazepam] Other (See Comments)    Pt states makes her crazy    Level of Care/Admitting Diagnosis ED Disposition    ED Disposition Condition Comment   Admit  Hospital Area: [place] [100102]  Level of Care: Telemetry [5]  Admit to tele based on following criteria: Complex arrhythmia (Bradycardia/Tachycardia)  Covid Evaluation: Confirmed COVID Negative  Diagnosis: Hyponatremia [456256]  Admitting Physician: Shela Leff [[id number]]  Attending Physician: Shela Leff [[id number]]  Estimated length of stay: past midnight tomorrow  Certification:: I certify this patient will need inpatient services for at least 2 midnights  PT Class (Do Not Modify): Inpatient [101]  PT Acc Code (Do Not Modify): Private [1]       B Medical/Surgery History Past Medical History:  Diagnosis Date  . Alcohol abuse   . Arthritis   . Ascites   . Atherosclerosis of aorta ([place])   . Cholelithiasis   . Cirrhosis ([place])   . Complication  of anesthesia    Difficult time waking up after having biospy with colonoscopy  . 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 Compression fracture of lumbar vertebra ([facility])     L1 and L2  . Emphysema of lung ([place])   . History of multiple myeloma    smoldering  . Hx of adenomatous polyp of rectum 05/21/2014  . Hyponatremia   . Macrocytic anemia   . 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 Osteoporosis   . Pneumonia 04/2014  . Tobacco abuse   . Tubular adenoma   . Vertebral compression fracture [facility])    Past Surgical History:  Procedure Laterality Date  . COLONOSCOPY N/A 05/24/2014   Procedure: COLONOSCOPY;  Surgeon: Gatha Mayer, MD;  Location: [facility];  Service: Endoscopy;  Laterality: N/A;  MAC if possible ultraslim colonoscope and gastroscope needed  . FLEXIBLE SIGMOIDOSCOPY N/A 05/21/2014   Procedure: FLEXIBLE SIGMOIDOSCOPY;  Surgeon: Gatha Mayer, MD;  Location: [facility];  Service: Endoscopy;  Laterality: N/A;  . KYPHOPLASTY N/A 12/12/2014   Procedure: KYPHOPLASTY;  Surgeon: Phylliss Bob, MD;  Location: [place];  Service: Orthopedics;  Laterality: N/A;  T10 kyphoplasty  . MANDIBLE FRACTURE SURGERY     X 2  . TONSILLECTOMY       A IV Location/Drains/Wounds Patient Lines/Drains/Airways Status   Active Line/Drains/Airways    Name:   Placement date:   Placement time:   Site:   Days:   Peripheral IV 12/29/18  Right Antecubital   12/29/18    1814    Antecubital   less than 1   Incision (Closed) 12/12/14 Back Other (Comment)   12/12/14    1251     1478          Intake/Output Last 24 hours No intake or output data in the 24 hours ending 12/29/18 2112  Labs/Imaging Results for orders placed or performed during the hospital encounter of 12/29/18 (from the past 48 hour(s))  SARS Coronavirus 2 (CEPHEID - Performed in [place] hospital lab), Hosp Order     Status: None   Collection Time: 12/29/18  4:22 PM  Result Value Ref Range   SARS Coronavirus 2  NEGATIVE NEGATIVE    Comment: (NOTE) If result is NEGATIVE SARS-CoV-2 target nucleic acids are NOT DETECTED. The SARS-CoV-2 RNA is generally detectable in upper and lower  respiratory specimens during the acute phase of infection. The lowest  concentration of SARS-CoV-2 viral copies this assay can detect is 250  copies / mL. A negative result does not preclude SARS-CoV-2 infection  and should not be used as the sole basis for treatment or other  patient management decisions.  A negative result may occur with  improper specimen collection / handling, submission of specimen other  than nasopharyngeal swab, presence of viral mutation(s) within the  areas targeted by this assay, and inadequate number of viral copies  (<250 copies / mL). A negative result must be combined with clinical  observations, patient history, and epidemiological information. If result is POSITIVE SARS-CoV-2 target nucleic acids are DETECTED. The SARS-CoV-2 RNA is generally detectable in upper and lower  respiratory specimens dur ing the acute phase of infection.  Positive  results are indicative of active infection with SARS-CoV-2.  Clinical  correlation with patient history and other diagnostic information is  necessary to determine patient infection status.  Positive results do  not rule out bacterial infection or co-infection with other viruses. If result is PRESUMPTIVE POSTIVE SARS-CoV-2 nucleic acids MAY BE PRESENT.   A presumptive positive result was obtained on the submitted specimen  and confirmed on repeat testing.  While 2019 novel coronavirus  (SARS-CoV-2) nucleic acids may be present in the submitted sample  additional confirmatory testing may be necessary for epidemiological  and / or clinical management purposes  to differentiate between  SARS-CoV-2 and other Sarbecovirus currently known to infect humans.  If clinically indicated additional testing with an alternate test  methodology [phone]) is  advised. The SARS-CoV-2 RNA is generally  detectable in upper and lower respiratory sp ecimens during the acute  phase of infection. The expected result is Negative. Fact Sheet for Patients:  StrictlyIdeas.no Fact Sheet for Healthcare Providers: BankingDealers.co.za This test is not yet approved or cleared by the [place] FDA and has been authorized for detection and/or diagnosis of SARS-CoV-2 by FDA under an Emergency Use Authorization (EUA).  This EUA will remain in effect (meaning this test can be used) for the duration of the COVID-19 declaration under Section 564(b)(1) of the Act, 21 U.S.C. section 360bbb-3(b)(1), unless the authorization is terminated or revoked sooner. Performed at [facility], [place] [address], [place], [place] [address]   Troponin I - Once     Status: None   Collection Time: 12/29/18  6:18 PM  Result Value Ref Range   Troponin I <0.03 <0.03 ng/mL    Comment: Performed at [facility], [place] [address], [place], [place] [address]  Magnesium  Status: None   Collection Time: 12/29/18  6:18 PM  Result Value Ref Range   Magnesium 1.7 1.7 - 2.4 mg/dL    Comment: Performed at [facility], [place] [address], [place], [place] [address]  Lactic acid, plasma     Status: Abnormal   Collection Time: 12/29/18  6:39 PM  Result Value Ref Range   Lactic Acid, Venous 3.3 (HH) 0.5 - 1.9 mmol/L    Comment: CRITICAL RESULT CALLED TO, READ BACK BY AND VERIFIED WITH: C.Chyann Ambrocio AT 1920 ON 12/29/18 BY N.THOMPSON Performed at [facility], [place] [address], [place], [place] [address]    Dg Chest Port 1 View  Result Date: 12/29/2018 CLINICAL DATA:  Multiple abnormal labs. Multiple myeloma. Bilateral leg and arm pain. EXAM: PORTABLE CHEST 1 VIEW COMPARISON:  PET-CT dated 03/24/2018. FINDINGS: Normal sized heart. Small to moderate-sized bilateral pleural  effusions and ill-defined increased density at both lung bases. Diffuse osteopenia. Lower thoracic vertebral kyphoplasty material. IMPRESSION: Small to moderate-sized bilateral pleural effusions and bibasilar atelectasis. Electronically Signed   By: Claudie Revering M.D.   On: 12/29/2018 17:38    Pending Labs Unresulted Labs (From admission, onward)    Start     Ordered   12/30/18 0500  CBC  Tomorrow morning,   R     12/29/18 2112   12/30/18 0500  Hepatic function panel  Tomorrow morning,   R     12/29/18 2112   12/29/18 2112  Procalcitonin - Baseline  ONCE - STAT,   STAT     12/29/18 2112   12/29/18 2112  Culture, Urine  Add-on,   R     12/29/18 2112   12/29/18 2111  Culture, blood (routine x 2)  BLOOD CULTURE X 2,   R     12/29/18 2112   12/29/18 2111  TSH  Once,   R     12/29/18 2112   12/29/18 2111  Osmolality  Once,   R     12/29/18 2112   12/29/18 2111  Osmolality, urine  Once,   R     12/29/18 2112   12/29/18 2440  Basic metabolic panel  Now then every 4 hours,   R     12/29/18 2112   12/29/18 2110  Lactic acid, plasma  ONCE - STAT,   R    Comments:  Collect after fluid bolus.    12/29/18 2112   12/29/18 2105  HIV antibody (Routine Testing)  Once,   R     12/29/18 2112   12/29/18 1842  Brain natriuretic peptide  Add-on,   STAT     12/29/18 1841   12/29/18 1621  Urinalysis, Routine w reflex microscopic  ONCE - STAT,   STAT     12/29/18 1621          Vitals/Pain Today's Vitals   12/29/18 1756 12/29/18 1756 12/29/18 1900 12/29/18 1935  BP:    110/66  Pulse:   (!) 114 (!) 107  Resp:   17 (!) 25  Temp:  97.9 F (36.6 C)    TempSrc:  Rectal    SpO2:   94% 96%  Weight:      Height:      PainSc: 9        Isolation Precautions No active isolations  Medications Medications  potassium chloride 10 mEq in 100 mL IVPB (10 mEq Intravenous New Bag/Given 12/29/18 2034)  acyclovir (ZOVIRAX) tablet 400 mg (has no administration in time  range)  gabapentin (NEURONTIN) capsule  300 mg (has no administration in time range)  umeclidinium-vilanterol (ANORO ELLIPTA) 62.5-25 MCG/INH 1 puff (has no administration in time range)  albuterol (VENTOLIN HFA) 108 (90 Base) MCG/ACT inhaler 2 puff (has no administration in time range)  Lidocaine 4 % PTCH 1 patch (has no administration in time range)  acetaminophen (TYLENOL) tablet 650 mg (has no administration in time range)    Or  acetaminophen (TYLENOL) suppository 650 mg (has no administration in time range)  LORazepam (ATIVAN) tablet 1 mg (has no administration in time range)    Or  LORazepam (ATIVAN) injection 1 mg (has no administration in time range)  thiamine (VITAMIN B-1) tablet 100 mg (has no administration in time range)    Or  thiamine (B-1) injection 100 mg (has no administration in time range)  folic acid (FOLVITE) tablet 1 mg (has no administration in time range)  multivitamin with minerals tablet 1 tablet (has no administration in time range)  0.9 %  sodium chloride infusion (has no administration in time range)  potassium chloride SA (K-DUR) CR tablet 20 mEq (has no administration in time range)  potassium chloride SA (K-DUR) CR tablet 20 mEq (20 mEq Oral Given 12/29/18 1756)  gabapentin (NEURONTIN) capsule 300 mg (300 mg Oral Given 12/29/18 1756)    Mobility non-ambulatory Low fall risk   Focused Assessments    R Recommendations: See Admitting Provider Note  Report given to:   Additional Notes:

## 2018-12-29 NOTE — ED Notes (Signed)
Pt is aware a urine specimen is needed.  

## 2018-12-29 NOTE — ED Provider Notes (Signed)
[place] DEPT Provider Note   CSN: [id number] Arrival date & time: 12/29/18  1522    History   Chief Complaint Chief Complaint  Patient presents with  . cancer patient  . Abnormal Lab  . weeping legs    HPI Cassidy Barrett is a 73 y.o. female.     The history is provided by the patient and medical records. No language interpreter was used.  Abnormal Lab   Cassidy Barrett is a 73 y.o. female who presents to the Emergency Department complaining of abnormal labs. She presents to the emergency department for evaluation of lab abnormality. She was going to the cancer center today for a routine visit and a lab check prior to the medication administration. Her labs were significant for acute on chronic hyponatremia with hypokalemia. She reports that she was feeling well until earlier today when she noted significant worsening in bilateral lower extremity edema with weeping from the right leg. She also complains of body aches and malaise that began today. She has chronic shortness of breath and this is unchanged from baseline. She does take Spironolactone as well as Lasix, PRN for edema. She has not taken Lasix today. She does take occasional potassium supplement, but only when she takes her Lasix. No known coronavirus exposures. No fevers. No nausea, vomiting, abdominal pain. Past Medical History:  Diagnosis Date  . Alcohol abuse   . Arthritis   . Ascites   . Atherosclerosis of aorta ([place])   . Cholelithiasis   . Cirrhosis ([place])   . Complication of anesthesia    Difficult time waking up after having biospy with colonoscopy  . 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 Compression fracture of lumbar vertebra ([facility])     L1 and L2  . Emphysema of lung ([place])   . History of multiple myeloma    smoldering  . Hx of adenomatous polyp of rectum 05/21/2014  . Hyponatremia   . Macrocytic anemia   . 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  Osteoporosis   . Pneumonia 04/2014  . Tobacco abuse   . Tubular adenoma   . Vertebral compression fracture [facility])     Patient Active Problem List   Diagnosis Date Noted  . Hyponatremia 12/29/2018  . Anemia 04/05/2018  . Multiple myeloma ([place]) 04/04/2018  . Counseling regarding advance care planning and goals of care 04/04/2018  . 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 Osteoporosis 10/24/2015  . Vitamin D deficiency 10/24/2015  . Plasma cell dyscrasia 09/30/2015  . Compression fracture 12/12/2014  . Alcoholic cirrhosis ([place]) 27/78/2423  . Hx of adenomatous polyp of rectum 05/21/2014  . Protein-calorie malnutrition, severe ([place]) 05/16/2014  . Edema 05/14/2014  . Tobacco abuse 05/14/2014  . Alcohol abuse 05/14/2014  . COPD GOLD III 05/14/2014    Past Surgical History:  Procedure Laterality Date  . COLONOSCOPY N/A 05/24/2014   Procedure: COLONOSCOPY;  Surgeon: Gatha Mayer, MD;  Location: [facility];  Service: Endoscopy;  Laterality: N/A;  MAC if possible ultraslim colonoscope and gastroscope needed  . FLEXIBLE SIGMOIDOSCOPY N/A 05/21/2014   Procedure: FLEXIBLE SIGMOIDOSCOPY;  Surgeon: Gatha Mayer, MD;  Location: [facility];  Service: Endoscopy;  Laterality: N/A;  . KYPHOPLASTY N/A 12/12/2014   Procedure: KYPHOPLASTY;  Surgeon: Phylliss Bob, MD;  Location: [place];  Service: Orthopedics;  Laterality: N/A;  T10 kyphoplasty  . MANDIBLE FRACTURE SURGERY     X 2  . TONSILLECTOMY  OB History   No obstetric history on file.      Home Medications    Prior to Admission medications   Medication Sig Start Date End Date Taking? Authorizing Provider  acyclovir (ZOVIRAX) 400 MG tablet TAKE 1 TABLET(400 MG) BY MOUTH TWICE DAILY Patient taking differently: Take 400 mg by mouth 2 (two) times daily.  11/30/18  Yes Brunetta Genera, MD  albuterol (VENTOLIN HFA) 108 (90 Base) MCG/ACT inhaler INHALE 2 PUFFS INTO THE LUNGS EVERY 6 HOURS AS NEEDED FOR WHEEZING OR  SHORTNESS OF BREATH Patient taking differently: Inhale 2 puffs into the lungs every 6 (six) hours as needed for wheezing or shortness of breath.  12/26/18  Yes Tanda Rockers, MD  ANORO ELLIPTA 62.5-25 MCG/INH AEPB INHALE 1 PUFF INTO THE LUNGS DAILY Patient taking differently: Inhale 1 puff into the lungs daily.  01/04/18  Yes Tanda Rockers, MD  diclofenac sodium (VOLTAREN) 1 % GEL Apply 1 g topically 3 (three) times daily.  09/06/18  Yes [provider]  ergocalciferol (VITAMIN D2) 50000 units capsule Take 1 capsule (50,000 Units total) by mouth once a week. 03/22/18  Yes Brunetta Genera, MD  furosemide (LASIX) 20 MG tablet Take 20 mg by mouth daily.    Yes [provider]  gabapentin (NEURONTIN) 300 MG capsule Take 1 capsule by mouth 3 (three) times daily. 10/18/18  Yes [provider]  Lidocaine 4 % PTCH Place 1 patch onto the skin daily as needed (severe pain). Remove & Discard patch within 12 hours or as directed by MD    Yes [provider]  OVER THE COUNTER MEDICATION Apply 400 mg topically daily as needed (arthritis/back pain). CBD cream   Yes [provider]  OVER THE COUNTER MEDICATION MAGNESIUM SPRAY 12MG--SPRAY ON FEET AS NEEDED FOR NEUROPATHY   Yes [provider]  spironolactone (ALDACTONE) 50 MG tablet TAKE 1 TABLET(50 MG) BY MOUTH DAILY Patient taking differently: Take 50 mg by mouth daily.  12/21/18  Yes Gatha Mayer, MD  ondansetron (ZOFRAN) 8 MG tablet Take 1 tablet (8 mg total) by mouth 2 (two) times daily as needed (Nausea or vomiting). Patient not taking: Reported on 12/29/2018 04/04/18   Brunetta Genera, MD  potassium chloride SA (K-DUR,KLOR-CON) 20 MEQ tablet Take 1 tablet (20 mEq total) by mouth 2 (two) times daily. Patient not taking: Reported on 12/29/2018 04/06/18   Gatha Mayer, MD    Family History Family History  Problem Relation Age of Onset  . Heart disease Mother   . Other Father        brain tumor   . COPD Sister   . ALS Brother   . Colon cancer Neg Hx   . Breast cancer Neg Hx   . Stomach cancer Neg Hx   . Esophageal cancer Neg Hx   . Rectal cancer Neg Hx     Social History Social History   Tobacco Use  . Smoking status: Current Every Day Smoker    Packs/day: 0.25    Years: 46.00    Pack years: 11.50    Types: Cigarettes    Last attempt to quit: 02/24/2017    Years since quitting: 1.8  . Smokeless tobacco: Never Used  . Tobacco comment: hemp cigs-no tabacco in them  Substance Use Topics  . Alcohol use: Yes    Alcohol/week: 0.0 standard drinks    Comment: 3  . Drug use: No     Allergies   Bee  venom; Benadryl [diphenhydramine]; Hydrocodone-acetaminophen; Other; Oxycodone; Tape; Tramadol; and Valium [diazepam]   Review of Systems Review of Systems  All other systems reviewed and are negative.    Physical Exam Updated Vital Signs BP 110/66 (BP Location: Left Arm)   Pulse (!) 107   Temp 97.9 F (36.6 C) (Rectal)   Resp (!) 25   Ht _0  (1.499 m)   Wt 33.2 kg   SpO2 96%   BMI 14.78 kg/m   Physical Exam Vitals signs and nursing note reviewed.  Constitutional:      Appearance: She is well-developed.  HENT:     Head: Normocephalic and atraumatic.  Cardiovascular:     Rate and Rhythm: Regular rhythm.     Heart sounds: No murmur.     Comments: Tachycardic Pulmonary:     Effort: Pulmonary effort is normal. No respiratory distress.     Comments: Decreased air movement in the right lung base Abdominal:     General: There is distension.     Palpations: Abdomen is soft.     Tenderness: There is no abdominal tenderness. There is no guarding or rebound.  Musculoskeletal:        General: Swelling and tenderness present.     Comments: 2+ DP pulses bilaterally. There is 2 to 3+ pitting edema to bilateral feet and legs to the mid shin bilaterally. There is sloughing of the skin on the heels bilaterally.  Skin:    General: Skin is warm and dry.  Neurological:      Mental Status: She is alert and oriented to person, place, and time.  Psychiatric:        Behavior: Behavior normal.      ED Treatments / Results  Labs (all labs ordered are listed, but only abnormal results are displayed) Labs Reviewed  LACTIC ACID, PLASMA - Abnormal; Notable for the following components:      Result Value   Lactic Acid, Venous 3.3 (*)    All other components within normal limits  SARS CORONAVIRUS 2 (HOSPITAL ORDER, [place] [facility])  CULTURE, BLOOD (ROUTINE X 2)  CULTURE, BLOOD (ROUTINE X 2)  URINE CULTURE  TROPONIN I  MAGNESIUM  URINALYSIS, ROUTINE W REFLEX MICROSCOPIC  BRAIN NATRIURETIC PEPTIDE  HIV ANTIBODY (ROUTINE TESTING W REFLEX)  CBC  HEPATIC FUNCTION PANEL  BASIC METABOLIC PANEL  BASIC METABOLIC PANEL  BASIC METABOLIC PANEL  LACTIC ACID, PLASMA  TSH  OSMOLALITY  OSMOLALITY, URINE  PROCALCITONIN    EKG None  Radiology Dg Chest Port 1 View  Result Date: 12/29/2018 CLINICAL DATA:  Multiple abnormal labs. Multiple myeloma. Bilateral leg and arm pain. EXAM: PORTABLE CHEST 1 VIEW COMPARISON:  PET-CT dated 03/24/2018. FINDINGS: Normal sized heart. Small to moderate-sized bilateral pleural effusions and ill-defined increased density at both lung bases. Diffuse osteopenia. Lower thoracic vertebral kyphoplasty material. IMPRESSION: Small to moderate-sized bilateral pleural effusions and bibasilar atelectasis. Electronically Signed   By: Claudie Revering M.D.   On: 12/29/2018 17:38    Procedures Procedures (including critical care time)  Medications Ordered in ED Medications  potassium chloride 10 mEq in 100 mL IVPB (10 mEq Intravenous New Bag/Given 12/29/18 2034)  acyclovir (ZOVIRAX) tablet 400 mg (has no administration in time range)  gabapentin (NEURONTIN) capsule 300 mg (has no administration in time range)  umeclidinium-vilanterol (ANORO ELLIPTA) 62.5-25 MCG/INH 1 puff (has no administration in time range)  albuterol  (VENTOLIN HFA) 108 (90 Base) MCG/ACT inhaler 2 puff (has no administration in time range)  Lidocaine 4 % PTCH 1 patch (has no administration in time range)  acetaminophen (TYLENOL) tablet 650 mg (has no administration in time range)    Or  acetaminophen (TYLENOL) suppository 650 mg (has no administration in time range)  LORazepam (ATIVAN) tablet 1 mg (has no administration in time range)    Or  LORazepam (ATIVAN) injection 1 mg (has no administration in time range)  thiamine (VITAMIN B-1) tablet 100 mg (has no administration in time range)    Or  thiamine (B-1) injection 100 mg (has no administration in time range)  folic acid (FOLVITE) tablet 1 mg (has no administration in time range)  multivitamin with minerals tablet 1 tablet (has no administration in time range)  0.9 %  sodium chloride infusion (has no administration in time range)  potassium chloride SA (K-DUR) CR tablet 20 mEq (has no administration in time range)  potassium chloride SA (K-DUR) CR tablet 20 mEq (20 mEq Oral Given 12/29/18 1756)  gabapentin (NEURONTIN) capsule 300 mg (300 mg Oral Given 12/29/18 1756)     Initial Impression / Assessment and Plan / ED Course  I have reviewed the triage vital signs and the nursing notes.  Pertinent labs & imaging results that were available during my care of the patient were reviewed by me and considered in my medical decision making (see chart for details).        Patient referred to the emergency department for lab abnormalities. She is markedly edematous on examination without respiratory distress. She was treated with potassium replacement. Plan to admit for further evaluation and management. She is noted to be tachycardic on evaluation, on record review she is persistently tachycardic. Lactate was obtained, which is elevated. Significant of this lactic acidosis is unclear as there is no current evidence of infectious process.  Final Clinical Impressions(s) / ED Diagnoses   Final  diagnoses:  Hypokalemia  Hyponatremia    ED Discharge Orders    None       Quintella Reichert, MD 12/29/18 2144

## 2018-12-29 NOTE — ED Notes (Signed)
This NT has attempted to obtain an EKG from pt but was unsuccessful. Ralene Bathe, MD has been aware of attempts.

## 2018-12-29 NOTE — ED Notes (Signed)
Bed: WA23 Expected date:  Expected time:  Means of arrival:  Comments: Hold for triage 1 

## 2018-12-29 NOTE — Progress Notes (Signed)
CRITICAL VALUE ALERT  Critical Value:  Lactic acide 3.9  Date & Time Notied:  06/04 at 2323  Provider Notified: Ms. x. blount  Orders Received/Actions taken:

## 2018-12-29 NOTE — H&P (Signed)
History and Physical    Cassidy Barrett:[id number] DOB: [birth year] DOA: 12/29/2018  PCP: Vicenta Aly, [place] Patient coming from: Cancer center  Chief Complaint: Abnormal labs  HPI: Cassidy Barrett is a 73 y.o. female with medical history significant of multiple myeloma, cirrhosis, vertebral compression fractures, emphysema, alcohol dependence, and conditions listed below presenting to the hospital from the cancer center for evaluation of abnormal labs.  Patient states she went to see Dr. Irene Limbo at the cancer center for a follow-up visit for her multiple myeloma and was told she had abnormal labs and advised to come into the hospital.  Denies any fevers, chills, cough, shortness of breath, chest pain, nausea, vomiting, abdominal pain, or diarrhea.  States she does not have much of an appetite.  She takes Lasix and lisinopril once a day for swelling in her legs.  No additional history could be obtained from her  ED Course: Temperature 98.6 F, heart rate 123, respiratory rate 18, blood pressure 83/63, SPO2 95% on room air on arrival.  Subsequent blood pressure readings normal.  White count 12.9.  Lactic acid 3.3.  Corrected sodium 126.  Potassium 2.8.  Magnesium within normal range.  Creatinine 0.4.  Alkaline phosphatase 457, chronically elevated and slightly improved since prior labs.  T bili 3.1, chronically elevated.  AST and ALT normal.  COVID-19 rapid test negative.  Troponin negative.  BNP pending.  Chest x-ray showing small to moderate sized bilateral pleural effusions and bibasilar atelectasis.  Patient received gabapentin and potassium supplementation in the ED.  Review of Systems:  All systems reviewed and apart from history of presenting illness, are negative.  Past Medical History:  Diagnosis Date  . Alcohol abuse   . Arthritis   . Ascites   . Atherosclerosis of aorta ([place])   . Cholelithiasis   . Cirrhosis ([place])   . Complication of anesthesia    Difficult time waking up after  having biospy with colonoscopy  . 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 Compression fracture of lumbar vertebra (HCC)     L1 and L2  . Emphysema of lung ([place])   . History of multiple myeloma    smoldering  . Hx of adenomatous polyp of rectum 05/21/2014  . Hyponatremia   . Macrocytic anemia   . 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 Osteoporosis   . Pneumonia 04/2014  . Tobacco abuse   . Tubular adenoma   . Vertebral compression fracture [facility])     Past Surgical History:  Procedure Laterality Date  . COLONOSCOPY N/A 05/24/2014   Procedure: COLONOSCOPY;  Surgeon: Gatha Mayer, MD;  Location: [facility];  Service: Endoscopy;  Laterality: N/A;  MAC if possible ultraslim colonoscope and gastroscope needed  . FLEXIBLE SIGMOIDOSCOPY N/A 05/21/2014   Procedure: FLEXIBLE SIGMOIDOSCOPY;  Surgeon: Gatha Mayer, MD;  Location: [facility];  Service: Endoscopy;  Laterality: N/A;  . KYPHOPLASTY N/A 12/12/2014   Procedure: KYPHOPLASTY;  Surgeon: Phylliss Bob, MD;  Location: [place];  Service: Orthopedics;  Laterality: N/A;  T10 kyphoplasty  . MANDIBLE FRACTURE SURGERY     X 2  . TONSILLECTOMY       reports that she has been smoking cigarettes. She has a 11.50 pack-year smoking history. She has never used smokeless tobacco. She reports current alcohol use. She reports that she does not use drugs.  Allergies  Allergen Reactions  . Bee Venom Swelling  . Benadryl [Diphenhydramine]     "climbs the walls"  .  Hydrocodone-Acetaminophen     "flips me out"  . 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 Oxycodone Other (See Comments)    Anxiety, restless  . Tape     Skin sensitive to tape  . Tramadol Diarrhea and Other (See Comments)    All listed side effects, except seizures  . Valium [Diazepam] Other (See Comments)    Pt states makes her crazy    Family History  Problem Relation Age of Onset  . Heart disease Mother   . Other Father        brain tumor  .  COPD Sister   . ALS Brother   . Colon cancer Neg Hx   . Breast cancer Neg Hx   . Stomach cancer Neg Hx   . Esophageal cancer Neg Hx   . Rectal cancer Neg Hx     Prior to Admission medications   Medication Sig Start Date End Date Taking? Authorizing Provider  acyclovir (ZOVIRAX) 400 MG tablet TAKE 1 TABLET(400 MG) BY MOUTH TWICE DAILY Patient taking differently: Take 400 mg by mouth 2 (two) times daily.  11/30/18   Brunetta Genera, MD  albuterol (VENTOLIN HFA) 108 (90 Base) MCG/ACT inhaler INHALE 2 PUFFS INTO THE LUNGS EVERY 6 HOURS AS NEEDED FOR WHEEZING OR SHORTNESS OF BREATH Patient taking differently: Inhale 2 puffs into the lungs every 6 (six) hours as needed for wheezing or shortness of breath.  12/26/18   Tanda Rockers, MD  ANORO ELLIPTA 62.5-25 MCG/INH AEPB INHALE 1 PUFF INTO THE LUNGS DAILY Patient taking differently: Inhale 1 puff into the lungs daily.  01/04/18   Tanda Rockers, MD  diclofenac sodium (VOLTAREN) 1 % GEL Apply 1 g topically 3 (three) times daily.  09/06/18   [provider]  ergocalciferol (VITAMIN D2) 50000 units capsule Take 1 capsule (50,000 Units total) by mouth once a week. 03/22/18   Brunetta Genera, MD  furosemide (LASIX) 20 MG tablet Take 10 mg by mouth daily.     [provider]  gabapentin (NEURONTIN) 300 MG capsule Take 1 capsule by mouth 3 (three) times daily. 10/18/18   [provider]  Lidocaine 4 % PTCH Place 1 patch onto the skin daily as needed (severe pain). Remove & Discard patch within 12 hours or as directed by MD     [provider]  ondansetron (ZOFRAN) 8 MG tablet Take 1 tablet (8 mg total) by mouth 2 (two) times daily as needed (Nausea or vomiting). 04/04/18   Brunetta Genera, MD  OVER THE COUNTER MEDICATION Apply 400 mg topically daily as needed (arthritis/back pain). CBD cream    [provider]  OVER THE COUNTER MEDICATION MAGNESIUM SPRAY 12MG--SPRAY ON FEET AS NEEDED FOR NEUROPATHY     [provider]  potassium chloride SA (K-DUR,KLOR-CON) 20 MEQ tablet Take 1 tablet (20 mEq total) by mouth 2 (two) times daily. 04/06/18   Gatha Mayer, MD  spironolactone (ALDACTONE) 50 MG tablet TAKE 1 TABLET(50 MG) BY MOUTH DAILY Patient taking differently: Take 50 mg by mouth daily.  12/21/18   Gatha Mayer, MD    Physical Exam: Vitals:   12/29/18 1756 12/29/18 1900 12/29/18 1935 12/29/18 2219  BP:   110/66 104/78  Pulse:  (!) 114 (!) 107 (!) 106  Resp:  17 (!) 25 18  Temp: 97.9 F (36.6 C)     TempSrc: Rectal     SpO2:  94% 96% 97%  Weight:      Height:        Physical Exam  Constitutional: She is oriented to person, place, and time.  Cachectic elderly female  HENT:  Head: Normocephalic.  Very dry mucous membranes  Eyes: Right eye exhibits no discharge. Left eye exhibits no discharge.  Neck: Neck supple.  Cardiovascular: Regular rhythm and intact distal pulses.  Slightly tachycardic  Pulmonary/Chest: Effort normal. No respiratory distress. She has no wheezes. She has no rales.  Slightly diminished breath sounds at the bases  Abdominal: Soft. Bowel sounds are normal. She exhibits no distension. There is no abdominal tenderness. There is no guarding.  Musculoskeletal:        General: Edema present.     Comments: +2 edema at the level of the ankles bilaterally Hematoma noted bilateral shins (R >L) A very small skin abrasion noted on the right shin, no active bleeding Sloughing of the skin of the heels bilaterally  Neurological: She is alert and oriented to person, place, and time.  Skin: Skin is warm and dry. She is not diaphoretic.  Decreased skin turgor     Labs on Admission: I have personally reviewed following labs and imaging studies  CBC: Recent Labs  Lab 12/29/18 1309  WBC 12.9*  NEUTROABS 10.8*  HGB 13.1  HCT 35.1*  MCV 95.4  PLT 947   Basic Metabolic Panel: Recent Labs  Lab 12/29/18 1309 12/29/18 1818  NA 125*  --   K 2.8*  --    CL 83*  --   CO2 31  --   GLUCOSE 136*  --   BUN <4*  --   CREATININE 0.49  --   CALCIUM 7.3*  --   MG  --  1.7   GFR: Estimated Creatinine Clearance: 34.3 mL/min (by C-G formula based on SCr of 0.49 mg/dL). Liver Function Tests: Recent Labs  Lab 12/29/18 1309  AST 32  ALT 15  ALKPHOS 457*  BILITOT 3.1*  PROT 4.5*  ALBUMIN 2.2*   No results for input(s): LIPASE, AMYLASE in the last 168 hours. No results for input(s): AMMONIA in the last 168 hours. Coagulation Profile: No results for input(s): INR, PROTIME in the last 168 hours. Cardiac Enzymes: Recent Labs  Lab 12/29/18 1818  TROPONINI <0.03   BNP (last 3 results) No results for input(s): PROBNP in the last 8760 hours. HbA1C: No results for input(s): HGBA1C in the last 72 hours. CBG: No results for input(s): GLUCAP in the last 168 hours. Lipid Profile: No results for input(s): CHOL, HDL, LDLCALC, TRIG, CHOLHDL, LDLDIRECT in the last 72 hours. Thyroid Function Tests: No results for input(s): TSH, T4TOTAL, FREET4, T3FREE, THYROIDAB in the last 72 hours. Anemia Panel: No results for input(s): VITAMINB12, FOLATE, FERRITIN, TIBC, IRON, RETICCTPCT in the last 72 hours. Urine analysis:    Component Value Date/Time   COLORURINE ORANGE (A) 12/11/2014 1552   APPEARANCEUR CLEAR 12/11/2014 1552   LABSPEC 1.023 12/11/2014 1552   PHURINE 5.0 12/11/2014 1552   GLUCOSEU NEGATIVE 12/11/2014 1552   HGBUR NEGATIVE 12/11/2014 1552   BILIRUBINUR SMALL (A) 12/11/2014 1552   KETONESUR 15 (A) 12/11/2014 1552   PROTEINUR NEGATIVE 12/11/2014 1552   UROBILINOGEN 1.0 12/11/2014 1552   NITRITE NEGATIVE 12/11/2014 1552   LEUKOCYTESUR TRACE (A) 12/11/2014 1552    Radiological Exams on Admission: Dg Chest Port 1 View  Result Date: 12/29/2018 CLINICAL DATA:  Multiple abnormal labs. Multiple myeloma. Bilateral leg and arm pain. EXAM: PORTABLE CHEST 1 VIEW COMPARISON:  PET-CT dated 03/24/2018. FINDINGS: Normal sized heart. Small to  moderate-sized bilateral pleural effusions and ill-defined increased density at both lung bases. Diffuse osteopenia. Lower thoracic vertebral kyphoplasty material. IMPRESSION: Small to moderate-sized bilateral pleural effusions and bibasilar atelectasis. Electronically Signed   By: Claudie Revering M.D.   On: 12/29/2018 17:38    EKG: Pending at this time.  Assessment/Plan Principal Problem:   SIRS (systemic inflammatory response syndrome) (HCC) Active Problems:   Hypokalemia   Alcoholic cirrhosis (HCC)   Hyponatremia   Bilateral lower extremity edema   SIRS/ ?sepsis, unclear source Patient is afebrile.  White count only mildly elevated at 12.9.  Lactic acid 3.3, possibly due to dehydration. Tachycardic but per chart review this is chronic.  Blood pressure was low on arrival but subsequent readings normal without receiving any IV fluid in the ED.  Chest x-ray not suggestive of pneumonia.  COVID-19 rapid test negative.  No other source of infection apparent at this time. -Appears very dehydrated.  IV fluid bolus and infusion. -Empiric vancomycin and cefepime at this time.  Check procalcitonin level. -UA, urine culture -Blood culture x2 -Continue to trend lactate -Continue to monitor CBC -Check TSH (chronic tachycardia).  EKG pending.  Acute on chronic hyponatremia Sodium chronically in the upper 120s to low 130s.  Now 126.  Possibly related to poor oral intake and ?diuretic use (although not on a thiazide diuretic). -IV fluid hydration -BMP every 4 hours -Check serum osmolarity, urine osmolarity  Hypokalemia Likely related to Lasix use and decreased p.o. intake.  Potassium 2.8.  Magnesium within normal range. -Replete potassium and continue to monitor -Cardiac monitoring -Check EKG -Hold diuretics at this time  Bilateral lower extremity edema Patient has +2 pedal edema at the level of the ankles only.  Appears dehydrated on exam otherwise with very dry mucous membranes and decreased  skin turgor.  Also has electrolyte derangements including hypokalemia and hyponatremia.  Does have small to moderate pleural effusions on chest x-ray but no signs of respiratory distress.  Not tachypneic or hypoxic.  Echo done June 2018 with EF 65 to 70% and grade 1 diastolic dysfunction. -Hold diuretics at this time -BNP pending  Alcoholic liver cirrhosis Alkaline phosphatase 457, chronically elevated and slightly improved since prior labs.  T bili 3.1, chronically elevated.  AST and ALT normal.   -Continue to monitor LFTs  Alcohol dependence Patient reports drinking 3 glasses of wine every night, last drink 6/3 evening. -CIWA protocol; Ativan PRN -Thiamine, folate, multivitamin  Protein calorie malnutrition Albumin 2.2.  BMI 14.7. -Nutrition consult  Physical deconditioning -PT and OT evaluation  DVT prophylaxis: SCDs.  Patient has hematomas on bilateral shins. Code Status: Patient wishes to be full code. Family Communication: No family available at this time. Disposition Plan: Anticipate discharge after clinical improvement. Consults called: None Admission status: It is my clinical opinion that admission to INPATIENT is reasonable and necessary in this 73 y.o. female . presenting with electrolyte derangements including hyponatremia and hypokalemia.  Possible sepsis. Marland Kitchen in the context of PMH including: Multiple myeloma . with pertinent positives on physical exam including: Cachexia, tachycardia . and pertinent positives on radiographic and laboratory data including: Labs suggestive of hyponatremia and hypokalemia. . Workup and treatment include IV antibiotics and electrolyte repletion.  Work-up is pending at this time.  Patient is very weak and cachectic.  Will need PT and OT evaluation, possible SNF placement.  Given the aforementioned, the predictability of an adverse outcome is felt to be significant. I expect  that the patient will require at least 2 midnights in the hospital to  treat this condition.   The medical decision making on this patient was of high complexity and the patient is at high risk for clinical deterioration, therefore this is a level 3 visit.  Shela Leff MD [facility] Pager [phone]  If 7PM-7AM, please contact night-coverage [email or web address] Password [email or web address]  12/29/2018, 10:26 PM

## 2018-12-30 ENCOUNTER — Inpatient Hospital Stay (HOSPITAL_COMMUNITY): Payer: Medicare Other

## 2018-12-30 LAB — BASIC METABOLIC PANEL
Anion gap: 8 (ref 5–15)
BUN: 5 mg/dL — ABNORMAL LOW (ref 8–23)
CO2: 25 mmol/L (ref 22–32)
Calcium: 6.5 mg/dL — ABNORMAL LOW (ref 8.9–10.3)
Chloride: 94 mmol/L — ABNORMAL LOW (ref 98–111)
Creatinine, Ser: <0.3 mg/dL — ABNORMAL LOW (ref 0.44–1.00)
Glucose, Bld: 119 mg/dL — ABNORMAL HIGH (ref 70–99)
Potassium: 3.7 mmol/L (ref 3.5–5.1)
Sodium: 127 mmol/L — ABNORMAL LOW (ref 135–145)

## 2018-12-30 LAB — CBC
HCT: 32.8 % — ABNORMAL LOW (ref 36.0–46.0)
Hemoglobin: 11.7 g/dL — ABNORMAL LOW (ref 12.0–15.0)
MCH: 36.6 pg — ABNORMAL HIGH (ref 26.0–34.0)
MCHC: 35.7 g/dL (ref 30.0–36.0)
MCV: 102.5 fL — ABNORMAL HIGH (ref 80.0–100.0)
Platelets: 208 10*3/uL (ref 150–400)
RBC: 3.2 MIL/uL — ABNORMAL LOW (ref 3.87–5.11)
RDW: 16 % — ABNORMAL HIGH (ref 11.5–15.5)
WBC: 13.9 10*3/uL — ABNORMAL HIGH (ref 4.0–10.5)
nRBC: 0 % (ref 0.0–0.2)

## 2018-12-30 LAB — URINALYSIS, ROUTINE W REFLEX MICROSCOPIC
Glucose, UA: NEGATIVE mg/dL
Hgb urine dipstick: NEGATIVE
Ketones, ur: NEGATIVE mg/dL
Leukocytes,Ua: NEGATIVE
Nitrite: NEGATIVE
Protein, ur: NEGATIVE mg/dL
Specific Gravity, Urine: 1.017 (ref 1.005–1.030)
pH: 6 (ref 5.0–8.0)

## 2018-12-30 LAB — HEPATIC FUNCTION PANEL
ALT: 16 U/L (ref 0–44)
AST: 31 U/L (ref 15–41)
Albumin: 1.9 g/dL — ABNORMAL LOW (ref 3.5–5.0)
Alkaline Phosphatase: 310 U/L — ABNORMAL HIGH (ref 38–126)
Bilirubin, Direct: 1.7 mg/dL — ABNORMAL HIGH (ref 0.0–0.2)
Indirect Bilirubin: 2.4 mg/dL — ABNORMAL HIGH (ref 0.3–0.9)
Total Bilirubin: 4.1 mg/dL — ABNORMAL HIGH (ref 0.3–1.2)
Total Protein: 4 g/dL — ABNORMAL LOW (ref 6.5–8.1)

## 2018-12-30 LAB — KAPPA/LAMBDA LIGHT CHAINS
Kappa free light chain: 7.9 mg/L (ref 3.3–19.4)
Kappa, lambda light chain ratio: 0.59 (ref 0.26–1.65)
Lambda free light chains: 13.4 mg/L (ref 5.7–26.3)

## 2018-12-30 LAB — HIV ANTIBODY (ROUTINE TESTING W REFLEX): HIV Screen 4th Generation wRfx: NONREACTIVE

## 2018-12-30 LAB — TSH: TSH: 2.131 u[IU]/mL (ref 0.350–4.500)

## 2018-12-30 LAB — OSMOLALITY: Osmolality: 259 mOsm/kg — ABNORMAL LOW (ref 275–295)

## 2018-12-30 LAB — OSMOLALITY, URINE: Osmolality, Ur: 323 mOsm/kg (ref 300–900)

## 2018-12-30 LAB — LACTIC ACID, PLASMA: Lactic Acid, Venous: 2.3 mmol/L (ref 0.5–1.9)

## 2018-12-30 MED ORDER — ENSURE ENLIVE PO LIQD
237.0000 mL | Freq: Three times a day (TID) | ORAL | Status: DC
  Administered 2019-01-01: 237 mL via ORAL

## 2018-12-30 MED ORDER — SODIUM CHLORIDE 0.9 % IV SOLN
2.0000 g | Freq: Two times a day (BID) | INTRAVENOUS | Status: DC
  Administered 2018-12-30 – 2019-01-01 (×4): 2 g via INTRAVENOUS
  Filled 2018-12-30 (×5): qty 2

## 2018-12-30 MED ORDER — ONDANSETRON HCL 4 MG/2ML IJ SOLN: 4.0000 mg | Freq: Four times a day (QID) | INTRAMUSCULAR | Status: DC | PRN

## 2018-12-30 MED ORDER — SODIUM CHLORIDE 0.9 % IV BOLUS
250.0000 mL | Freq: Once | INTRAVENOUS | Status: AC
  Administered 2018-12-30: 07:00:00 via INTRAVENOUS

## 2018-12-30 MED ORDER — VANCOMYCIN HCL IN DEXTROSE 750-5 MG/150ML-% IV SOLN: 750.0000 mg | INTRAVENOUS | Status: DC

## 2018-12-30 MED ORDER — METRONIDAZOLE IN NACL 5-0.79 MG/ML-% IV SOLN
500.0000 mg | Freq: Three times a day (TID) | INTRAVENOUS | Status: DC
  Administered 2018-12-30 – 2018-12-31 (×3): 500 mg via INTRAVENOUS
  Filled 2018-12-30 (×4): qty 100

## 2018-12-30 MED ORDER — LIDOCAINE 5 % EX PTCH
3.0000 | MEDICATED_PATCH | Freq: Every day | CUTANEOUS | Status: DC | PRN
  Administered 2018-12-30 – 2018-12-31 (×2): 3 via TRANSDERMAL
  Administered 2019-01-01: 1 via TRANSDERMAL
  Administered 2019-01-01: 2 via TRANSDERMAL
  Filled 2018-12-30 (×6): qty 3

## 2018-12-30 MED ORDER — SODIUM CHLORIDE 0.9 % IV BOLUS
1000.0000 mL | Freq: Once | INTRAVENOUS | Status: AC
  Administered 2018-12-30: 1000 mL via INTRAVENOUS

## 2018-12-30 NOTE — TOC Initial Note (Signed)
Transition of Care [facility]) - Initial/Assessment Note    Patient Details  Name: LECRETIA BUCZEK MRN: [id number] Date of Birth: 06-24-1946  [facility] of [facility] CM/SW Contact:    Purcell Mouton, RN Phone Number: 12/30/2018, 4:18 PM  Clinical Narrative:                   Expected Discharge Plan: [place] Barriers to Discharge: No Barriers Identified   Patient Goals and CMS Choice        Expected Discharge Plan and Services Expected Discharge Plan: [place]   Discharge Planning Services: CM Consult   Living arrangements for the past 2 months: Single Family Home                           HH Arranged: RN, PT, OT [facility] Agency: [facility] Date [place]: 12/30/18 Time [place]: 1316 Representative spoke with at [place]: Dorian Pod  Prior Living Arrangements/Services Living arrangements for the past 2 months: [place] with:: Spouse Patient language and need for interpreter reviewed:: No Do you feel safe going back to the place where you live?: Yes               Activities of Daily Living Home Assistive Devices/Equipment: Transport planner, Environmental consultant (specify type), Dentures (specify type)(front wheel) ADL Screening (condition at time of admission) [name] cognitive ability adequate to safely complete daily activities?: Yes Is the patient deaf or have difficulty hearing?: No Does the patient have difficulty seeing, even when wearing glasses/contacts?: No Does the patient have difficulty concentrating, remembering, or making decisions?: No Patient able to express need for assistance with ADLs?: Yes Does the patient have difficulty dressing or bathing?: Yes Independently performs ADLs?: No Communication: Independent Dressing (OT): Needs assistance Is this a change from baseline?: Pre-admission baseline Grooming: Needs assistance Is this a change from baseline?: Pre-admission  baseline Feeding: Independent Bathing: Needs assistance Is this a change from baseline?: Pre-admission baseline Toileting: Needs assistance Is this a change from baseline?: Pre-admission baseline In/Out Bed: Dependent Is this a change from baseline?: Pre-admission baseline Does the patient have difficulty walking or climbing stairs?: Yes Weakness of Legs: Both Weakness of Arms/Hands: Both  Permission Sought/Granted Permission sought to share information with : Case Manager                Emotional Assessment Appearance:: Appears stated age   Affect (typically observed): Accepting Orientation: : Oriented to Self, Oriented to Place, Oriented to  Time, Oriented to Situation      Admission diagnosis:  Hypokalemia [E87.6] Hyponatremia [E87.1] Patient Active Problem List   Diagnosis Date Noted  . Hyponatremia 12/29/2018  . SIRS (systemic inflammatory response syndrome) ([place]) 12/29/2018  . Bilateral lower extremity edema 12/29/2018  . Anemia 04/05/2018  . Multiple myeloma ([place]) 04/04/2018  . Counseling regarding advance care planning and goals of care 04/04/2018  . 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 Osteoporosis 10/24/2015  . Vitamin D deficiency 10/24/2015  . Plasma cell dyscrasia 09/30/2015  . Compression fracture 12/12/2014  . Alcoholic cirrhosis ([place]) 35/36/1443  . Hx of adenomatous polyp of rectum 05/21/2014  . Protein-calorie malnutrition, severe ([place]) 05/16/2014  . Edema 05/14/2014  . Tobacco abuse 05/14/2014  . Alcohol abuse 05/14/2014  . Hypokalemia 05/14/2014  . COPD GOLD III 05/14/2014   PCP:  Vicenta Aly, [place] Pharmacy:   [facility],  [place] - [address] AT [address] [place] [address] Phone: [phone] Fax: [facility], [place] - [address] [place] [address] Phone: [phone] Fax:  [phone]     Social Determinants of Health (SDOH) Interventions    Readmission Risk Interventions No flowsheet data found.

## 2018-12-30 NOTE — Progress Notes (Addendum)
Initial Nutrition Assessment  DOCUMENTATION CODES:   Underweight  INTERVENTION:   Provide Ensure Enlive po TID, each supplement provides 350 kcal and 20 grams of protein  NUTRITION DIAGNOSIS:   Increased nutrient needs related to chronic illness(ETOH cirrhosis) as evidenced by estimated needs.  GOAL:   Patient will meet greater than or equal to 90% of their needs  MONITOR:   PO intake, Supplement acceptance, Labs, Weight trends, I & O's  REASON FOR ASSESSMENT:   Consult Assessment of nutrition requirement/status  ASSESSMENT:   73 y.o. female with medical history significant of multiple myeloma, cirrhosis, vertebral compression fractures, emphysema, alcohol dependence, and conditions listed below presenting to the hospital from the cancer center for evaluation of abnormal labs.    **RD working remotely**  Patient with poor appetite, did not eat any breakfast, just some juice and ginger ale. Pt has been consuming ETOH lately, ~3 glasses of wine daily. Pt would benefit from nutritional supplements given increased needs, will order Ensure.   Per weight records , weights were around 73 lbs in October 2019 then started to increase to the highest of 80 lbs in March 2020. Weights are trending down again. Pt with BLE edema, uncertain if weight is mass vs. fluid loss. Per I/O's: +2.2L since admit.  Suspect some degree of malnutrition.  Medications: Folic acid tablet daily, Multivitamin with minerals daily, Thiamine tablet daily Labs reviewed:  Low Na  NUTRITION - FOCUSED PHYSICAL EXAM:  Unable to perform per department requirements to work remotely.  Diet Order:   Diet Order            Diet regular Room service appropriate? Yes; Fluid consistency: Thin  Diet effective now              EDUCATION NEEDS:   No education needs have been identified at this time  Skin:  Skin Assessment: Reviewed RN Assessment  Last BM:  6/2  Height:   Ht Readings from Last 1  Encounters:  12/29/18 '4\' 11"'$  (1.499 m)    Weight:   Wt Readings from Last 1 Encounters:  12/29/18 33.2 kg    Ideal Body Weight:  44.7 kg  BMI:  Body mass index is 14.78 kg/m.  Estimated Nutritional Needs:   Kcal:  1200-1400  Protein:  55-65g  Fluid:  1.5L/day  Clayton Bibles, MS, RD, LDN [place] Dietitian Pager: [phone] After Hours Pager: [phone]

## 2018-12-30 NOTE — Progress Notes (Signed)
CRITICAL VALUE ALERT  Critical Value:  Lactic Acid    Date & Time Notied:06/05 @ [phone]  Provider Notified: Ms. Jeannette Corpus  Orders Received/Actions taken:

## 2018-12-30 NOTE — Evaluation (Signed)
Physical Therapy Evaluation Patient Details Name: Cassidy Barrett MRN: [id number] DOB: 1946/05/21 Today's Date: 12/30/2018   History of Present Illness  73 y.o. female with medical history significant of multiple myeloma, cirrhosis, vertebral compression fractures, emphysema, alcohol dependence, and conditions listed below presenting to the hospital from the cancer center for evaluation of abnormal labs COVID-19 rapid test negative.  Troponin negative. Chest x-ray showing small to moderate sized bilateral pleural effusions and bibasilar atelectasis.   Clinical Impression  Pt admitted with above diagnosis. Pt currently with functional limitations due to the deficits listed below (see PT Problem List).  Recommend pt continue HHPT at d/c; pt extremely limited by pain this date, only able to tolerate rolling in bed with mod/max assist; pt reports she can incr the amount of care she is getting at home in addition to her wife assisting her.   Pt will benefit from skilled PT to increase their independence and safety with mobility to allow discharge to the venue listed below.       Follow Up Recommendations Home health PT(receiving HHPT prior to admission)    Equipment Recommendations  None recommended by PT    Recommendations for Other Services       Precautions / Restrictions Precautions Precautions: Fall Precaution Comments: pt with severe pain throughout with any and all mobility/movement of extremities Restrictions Weight Bearing Restrictions: No      Mobility  Bed Mobility Overal bed mobility: Needs Assistance Bed Mobility: Rolling Rolling: Mod assist;Max assist         General bed mobility comments: assist to complete roll to right x2 and left x2 (for NT to place pad and then f or RN to place lidocaine patches), pt intiates movement  but limited by pain   Transfers                    Ambulation/Gait                Stairs            Wheelchair  Mobility    Modified Rankin (Stroke Patients Only)       Balance                                             Pertinent Vitals/Pain Pain Assessment: Faces Pain Score: 10-Worst pain ever Faces Pain Scale: Hurts worst Pain Location: bil LEs, R UE, back Pain Descriptors / Indicators: Crying;Moaning;Grimacing Pain Intervention(s): Limited activity within patient's tolerance;Monitored during session;Repositioned;Premedicated before session    Home Living Family/patient expects to be discharged to:: Private residence Living Arrangements: Spouse/significant other(wife --works from home, also travels some ) Available Help at Discharge: Personal care attendant(CNA M-F, can have Sat/Sun if needed) Type of Home: House Home Access: Stairs to enter   [employer] of Steps: 1 Home Layout: One level Home Equipment: Bedside commode;Walker - 2 wheels;Wheelchair - Press photographer;Wheelchair - power Additional Comments: lift chair    Prior Function Level of Independence: Needs assistance   Gait / Transfers Assistance Needed: difficulty with transfers recently d/t pain in feet --neuropathy     Comments: has caregiver from 8-5 and wife assists at night.  She has assist for most adls; can feed self and do grooming. Has been having increasing difficulty with transfers.  Sleeps in lift chair     Hand Dominance   Dominant Hand: Left  Extremity/Trunk Assessment   Upper Extremity Assessment Upper Extremity Assessment: Defer to OT evaluation RUE Deficits / Details: bil UE edema at elbows:  weeping.  RUE very weak.  Able to lift approximately 20 degrees from bed LUE Deficits / Details: slow movement, did not MMT due to skin integrity.  Educated on AROM to tolerance and positioning for edema    Lower Extremity Assessment Lower Extremity Assessment: RLE deficits/detail;LLE deficits/detail RLE: Unable to fully assess due to pain RLE Sensation: history of  peripheral neuropathy LLE Deficits / Details: grossly WFL AAROM, knee/hip flexion to ~70*, strength 2+/5 LLE: Unable to fully assess due to pain LLE Sensation: history of peripheral neuropathy       Communication   Communication: No difficulties  Cognition Arousal/Alertness: Awake/alert Behavior During Therapy: WFL for tasks assessed/performed Overall Cognitive Status: Within Functional Limits for tasks assessed                                        General Comments General comments (skin integrity, edema, etc.): pt reports that she did not sleep at all.  Has been working with HHOT/PT on strengthening    Exercises     Assessment/Plan    PT Assessment Patient needs continued PT services  PT Problem List Decreased strength;Decreased range of motion;Decreased activity tolerance;Pain       PT Treatment Interventions Therapeutic exercise;Therapeutic activities;Functional mobility training;Patient/family education    PT Goals (Current goals can be found in the Care Plan section)  Acute Rehab PT Goals Patient Stated Goal: less pain PT Goal Formulation: With patient Time For Goal Achievement: 01/13/19 Potential to Achieve Goals: Fair    Frequency Min 2X/week   Barriers to discharge        Co-evaluation               AM-PAC PT "6 Clicks" Mobility  Outcome Measure Help needed turning from your back to your side while in a flat bed without using bedrails?: A Lot Help needed moving from lying on your back to sitting on the side of a flat bed without using bedrails?: Total Help needed moving to and from a bed to a chair (including a wheelchair)?: Total Help needed standing up from a chair using your arms (e.g., wheelchair or bedside chair)?: Total Help needed to walk in hospital room?: Total Help needed climbing 3-5 steps with a railing? : Total 6 Click Score: 7    End of Session   Activity Tolerance: Patient limited by pain Patient left: in  bed;with bed alarm set;with call bell/phone within reach   PT Visit Diagnosis: Muscle weakness (generalized) (M62.81)    Time: 1610-9604 PT Time Calculation (min) (ACUTE ONLY): 27 min   Charges:   PT Evaluation $PT Eval Low Complexity: 1 Low PT Treatments $Therapeutic Activity: 8-22 mins        Kenyon Ana, PT  Pager: [phone] Acute Rehab Dept [facility]): [phone]   12/30/2018   [facility] 12/30/2018, 2:27 PM

## 2018-12-30 NOTE — Progress Notes (Signed)
Patient address her concern regarding her current code status; pt wants to be DNR, stated she had a living will and DNR form; also, spoke to spouse, Gerda Diss [facility]) that she has all the documents, she said will bring it in the morning; PA on call also informed about the pt's wish.

## 2018-12-30 NOTE — Evaluation (Signed)
Occupational Therapy Evaluation Patient Details Name: Cassidy Barrett MRN: [id number] DOB: 1946-04-09 Today's Date: 12/30/2018    History of Present Illness 73 y.o. female with medical history significant of multiple myeloma, cirrhosis, vertebral compression fractures, emphysema, alcohol dependence, and conditions listed below presenting to the hospital from the cancer center for evaluation of abnormal labs COVID-19 rapid test negative.  Troponin negative. Chest x-ray showing small to moderate sized bilateral pleural effusions and bibasilar atelectasis.    Clinical Impression   Pt was admitted for the above. She reports that she has 24/7 help at home and has help with adls daily.  She has had increased difficulty with SPTs. She has been getting from lift chair to 3:1 commode and also to w/c.  Pt was in 10/10 pain today throughout.  Saw her at bed level only. Will continue to follow, working on sit to stand and 3:1 transfers as well as UE strengthening/edema control    Follow Up Recommendations  SNF;Supervision/Assistance - 24 hour(depending upon progress)    Equipment Recommendations  None recommended by OT    Recommendations for Other Services       Precautions / Restrictions Precautions Precautions: Fall Precaution Comments: pt with severe pain throughout with any and all mobility/movement of extremities Restrictions Weight Bearing Restrictions: No      Mobility Bed Mobility Overal bed mobility: Needs Assistance Bed Mobility: Rolling Rolling: Max assist         General bed mobility comments: used chux pad and pt reached for rail with LUE.  Need +2 for ADLs, positioning with pillows  Transfers                      Balance                                           ADL either performed or assessed with clinical judgement   ADL Overall ADL's : Needs assistance/impaired                                       General ADL  Comments: pt has assist with all adls at baseline.  Total A, +2 for all adls at this time due to pain, except for grooming and self feeding (min A).  Assisted NT with rolling for positioning, max A     Vision         Perception     Praxis      Pertinent Vitals/Pain Pain Assessment: Faces Pain Score: 10-Worst pain ever Faces Pain Scale: Hurts worst Pain Location: bil LEs, R UE, back Pain Descriptors / Indicators: Crying;Moaning;Grimacing Pain Intervention(s): Limited activity within patient's tolerance;Monitored during session;Repositioned     Hand Dominance Left   Extremity/Trunk Assessment Upper Extremity Assessment Upper Extremity Assessment: RUE deficits/detail;LUE deficits/detail RUE Deficits / Details: bil UE edema at elbows:  weeping.  RUE very weak.  Able to lift approximately 20 degrees from bed LUE Deficits / Details: slow movement, did not MMT due to skin integrity.  Educated on AROM to tolerance and positioning for edema           Communication Communication Communication: No difficulties   Cognition Arousal/Alertness: Awake/alert Behavior During Therapy: WFL for tasks assessed/performed Overall Cognitive Status: Within Functional Limits for tasks assessed  General Comments  pt reports that she did not sleep at all.  Has been working with HHOT/PT on strengthening    Exercises     Shoulder Instructions      Home Living Family/patient expects to be discharged to:: Private residence                             Home Equipment: Bedside commode;Walker - 2 wheels;Wheelchair - manual;Electric scooter(lift chair)          Prior Functioning/Environment Level of Independence: Needs assistance        Comments: has caregiver from 8-5 and wife assists at night.  She has assist for most adls; can feed self and do grooming. Has been having increasing difficulty with transfers.  Sleeps in lift chair         OT Problem List: Decreased strength;Decreased activity tolerance;Pain;Impaired UE functional use;Increased edema(balance NT)      OT Treatment/Interventions: Self-care/ADL training;Therapeutic exercise;Energy conservation;DME and/or AE instruction;Patient/family education;Therapeutic activities(? balance)    OT Goals(Current goals can be found in the care plan section) Acute Rehab OT Goals Patient Stated Goal: less pain OT Goal Formulation: With patient Time For Goal Achievement: 01/13/19 Potential to Achieve Goals: Good ADL Goals Pt Will Transfer to Toilet: with mod assist;bedside commode;stand pivot transfer Additional ADL Goal #1: pt will go from sit to stand with mod A and maintain with min guard during adls for 2 minutes Additional ADL Goal #2: pt will perform bil strengthening arom exercises, 2 sets of 5 x 3 movements to increase strength for adls/transfers  OT Frequency: Min 2X/week   Barriers to D/C:            Co-evaluation              AM-PAC OT "6 Clicks" Daily Activity     Outcome Measure Help from another person eating meals?: A Little Help from another person taking care of personal grooming?: A Little Help from another person toileting, which includes using toliet, bedpan, or urinal?: Total Help from another person bathing (including washing, rinsing, drying)?: Total Help from another person to put on and taking off regular upper body clothing?: Total Help from another person to put on and taking off regular lower body clothing?: Total 6 Click Score: 10   End of Session    Activity Tolerance: Patient limited by pain;Patient limited by fatigue Patient left: in bed;with call bell/phone within reach;with bed alarm set  OT Visit Diagnosis: Muscle weakness (generalized) (M62.81);Pain Pain - Right/Left: Right Pain - part of body: Arm;Leg                Time: 3435-6861 OT Time Calculation (min): 23 min Charges:  OT General Charges $OT Visit: 1  Visit OT Evaluation $OT Eval Low Complexity: Kendrick, OTR/L Acute Rehabilitation Services [phone] [facility] pager [phone] office 12/30/2018  [place] 12/30/2018, 1:07 PM

## 2018-12-30 NOTE — Progress Notes (Signed)
Pharmacy Antibiotic Note  Cassidy Barrett is a 73 y.o. female admitted on 12/29/2018 with pneumonia.  Pharmacy has been consulted for Vancomycin, cefepime dosing.  Plan: Vancomycin 0.75gm iv x1, then Vancomycin 750 mg IV Q 48 hrs. Goal AUC 400-550. Expected AUC: 477 SCr used: 0.8 (adjusted)  Cefepime 2gm iv x1, then 2gm iv q12hr   Height: 4\' 11"  (149.9 cm) Weight: 73 lb 3.1 oz (33.2 kg) IBW/kg (Calculated) : 43.2  Temp (24hrs), Avg:98.5 F (36.9 C), Min:97.9 F (36.6 C), Max:98.9 F (37.2 C)  Recent Labs  Lab 12/29/18 1309 12/29/18 1839 12/29/18 2256  WBC 12.9*  --   --   CREATININE 0.49  --  0.33*  LATICACIDVEN  --  3.3* 3.9*    Estimated Creatinine Clearance: 34.3 mL/min (A) (by C-G formula based on SCr of 0.33 mg/dL (L)).    Allergies  Allergen Reactions  . Bee Venom Swelling  . Benadryl [Diphenhydramine]     "climbs the walls"  . Hydrocodone-Acetaminophen     "flips me out"  . 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 Oxycodone Other (See Comments)    Anxiety, restless  . Tape     Skin sensitive to tape  . Tramadol Diarrhea and Other (See Comments)    All listed side effects, except seizures  . Valium [Diazepam] Other (See Comments)    Pt states makes her crazy    Antimicrobials this admission: Vancomycin 12/30/2018 >> Cefepime 12/30/2018 >>   Dose adjustments this admission: -  Microbiology results: -  Thank you for allowing pharmacy to be a part of this patient's care.  Nani Skillern Crowford 12/30/2018 4:53 AM

## 2018-12-30 NOTE — Progress Notes (Addendum)
PROGRESS NOTE    KENZLI BARRITT  PHX:[id number] DOB: 27-Sep-1945 DOA: 12/29/2018 PCP: Vicenta Aly, FNP     Brief Narrative:  Cassidy Barrett is a 73 y.o. female with medical history significant of multiple myeloma, cirrhosis, vertebral compression fractures, emphysema, alcohol dependence who presented to the hospital from the cancer center for evaluation of abnormal labs.  Patient states she went to see Dr. Irene Limbo at the cancer center for a follow-up visit for her multiple myeloma and was told she had abnormal labs and advised to come into the hospital.  She was found to have hyponatremia, as well as hypokalemia.  New events last 24 hours / Subjective: Her main complaint is generalized swelling.  She also complains of pain everywhere but most specifically in her abdomen.  Assessment & Plan:   Principal Problem:   SIRS (systemic inflammatory response syndrome) (HCC) Active Problems:   Hypokalemia   Alcoholic cirrhosis (HCC)   Hyponatremia   Bilateral lower extremity edema  Right upper quadrant pain -Patient has elevated bilirubin levels with chronic elevated LFT -Previous US in 2019 revealed gallstones with sludge -Obtain right upper quadrant ultrasound today -She was empirically started on vancomycin and cefepime at the time of admission, but did not have any clear source of infection at the time. Stop vanco and will add flagyl   Acute on chronic hypoosmolar hyponatremia -Patient with chronic sodium level 129-132 -Sodium level has improved 125-->127 overnight -Continue to monitor BMP closely  Hypokalemia -Replaced. Resolved  Alcoholic liver cirrhosis -Followed by Dr. Carlean Purl -Last alcoholic beverage 6/3 -Continue CIWA protocol  Protein calorie malnutrition -Appreciate nutrition consult  Chronic diastolic heart failure -Does not appear to be fluid overloaded on examination, BNP 100  Chronic macrocytic anemia -Continue folic acid  Multiple myeloma -Followed by Dr.  Irene Limbo   Chronic pain with neuropathy -Lidocaine patch and neurontin    DVT prophylaxis: SCD Code Status: DNR.  Confirmed with patient today, she does not desire any aggressive nor heroic measures such as CPR, defibrillation or mechanical ventilation. Family Communication: Spoke with wife over the phone  Disposition Plan: Pending work up and improvement in lab work   Consultants:    None  Procedures:   None   Antimicrobials:  Anti-infectives (From admission, onward)   Start     Dose/Rate Route Frequency Ordered Stop   12/31/18 2100  vancomycin (VANCOCIN) IVPB 750 mg/150 ml premix  Status:  Discontinued     750 mg 150 mL/hr over 60 Minutes Intravenous Every 48 hours 12/30/18 0453 12/30/18 1413   12/30/18 1500  metroNIDAZOLE (FLAGYL) IVPB 500 mg     500 mg 100 mL/hr over 60 Minutes Intravenous Every 8 hours 12/30/18 1413     12/30/18 1000  ceFEPIme (MAXIPIME) 2 g in sodium chloride 0.9 % 100 mL IVPB     2 g 200 mL/hr over 30 Minutes Intravenous Every 12 hours 12/30/18 0453     12/29/18 2300  ceFEPIme (MAXIPIME) 2 g in sodium chloride 0.9 % 100 mL IVPB     2 g 200 mL/hr over 30 Minutes Intravenous  Once 12/29/18 2249 12/30/18 0021   12/29/18 2300  vancomycin (VANCOCIN) IVPB 750 mg/150 ml premix     750 mg 150 mL/hr over 60 Minutes Intravenous  Once 12/29/18 2249 12/30/18 0131   12/29/18 2200  acyclovir (ZOVIRAX) tablet 400 mg     400 mg Oral 2 times daily 12/29/18 2112         Objective: Vitals:   12/30/18  3570 12/30/18 0858 12/30/18 1215 12/30/18 1217  BP: (!) 88/62 97/65    Pulse: 94 99    Resp: (!) 21     Temp: 97.9 F (36.6 C)     TempSrc: Oral     SpO2: 98% 98% 98% 98%  Weight:      Height:        Intake/Output Summary (Last 24 hours) at 12/30/2018 1415 Last data filed at 12/30/2018 0906 Gross per 24 hour  Intake 2505.63 ml  Output 300 ml  Net 2205.63 ml   Filed Weights   12/29/18 1557  Weight: 33.2 kg    Examination:  General exam: Appears calm and  comfortable  Respiratory system: Clear to auscultation. Respiratory effort normal. Cardiovascular system: S1 & S2 heard, RRR. No JVD, murmurs, rubs, gallops or clicks. No pedal edema. Gastrointestinal system: Abdomen is nondistended, soft and pain to palpation right upper quadrant, some fullness in the lower abdomen Central nervous system: Alert and oriented. No focal neurological deficits. Extremities: Symmetric 5 x 5 power. Skin: No rashes, lesions or ulcers, very thin skin Psychiatry: Judgement and insight appear normal. Mood & affect appropriate.   Data Reviewed: I have personally reviewed following labs and imaging studies  CBC: Recent Labs  Lab 12/29/18 1309 12/30/18 0927  WBC 12.9* 13.9*  NEUTROABS 10.8*  --   HGB 13.1 11.7*  HCT 35.1* 32.8*  MCV 95.4 102.5*  PLT 275 177   Basic Metabolic Panel: Recent Labs  Lab 12/29/18 1309 12/29/18 1818 12/29/18 2256 12/30/18 0554  NA 125*  --  126* 127*  K 2.8*  --  3.6 3.7  CL 83*  --  88* 94*  CO2 31  --  26 25  GLUCOSE 136*  --  133* 119*  BUN <4*  --  5* 5*  CREATININE 0.49  --  0.33* <0.30*  CALCIUM 7.3*  --  6.9* 6.5*  MG  --  1.7  --   --    GFR: CrCl cannot be calculated (This lab value cannot be used to calculate CrCl because it is not a number: <0.30). Liver Function Tests: Recent Labs  Lab 12/29/18 1309 12/30/18 0554  AST 32 31  ALT 15 16  ALKPHOS 457* 310*  BILITOT 3.1* 4.1*  PROT 4.5* 4.0*  ALBUMIN 2.2* 1.9*   No results for input(s): LIPASE, AMYLASE in the last 168 hours. No results for input(s): AMMONIA in the last 168 hours. Coagulation Profile: No results for input(s): INR, PROTIME in the last 168 hours. Cardiac Enzymes: Recent Labs  Lab 12/29/18 1818  TROPONINI <0.03   BNP (last 3 results) No results for input(s): PROBNP in the last 8760 hours. HbA1C: No results for input(s): HGBA1C in the last 72 hours. CBG: No results for input(s): GLUCAP in the last 168 hours. Lipid Profile: No  results for input(s): CHOL, HDL, LDLCALC, TRIG, CHOLHDL, LDLDIRECT in the last 72 hours. Thyroid Function Tests: Recent Labs    12/29/18 2256  TSH 2.131   Anemia Panel: No results for input(s): VITAMINB12, FOLATE, FERRITIN, TIBC, IRON, RETICCTPCT in the last 72 hours. Sepsis Labs: Recent Labs  Lab 12/29/18 1839 12/29/18 2256 12/30/18 0554  PROCALCITON  --  0.15  --   LATICACIDVEN 3.3* 3.9* 2.3*    Recent Results (from the past 240 hour(s))  SARS Coronavirus 2 (CEPHEID - Performed in [place] hospital lab), Hosp Order     Status: None   Collection Time: 12/29/18  4:22 PM  Result Value Ref  Range Status   SARS Coronavirus 2 NEGATIVE NEGATIVE Final    Comment: (NOTE) If result is NEGATIVE SARS-CoV-2 target nucleic acids are NOT DETECTED. The SARS-CoV-2 RNA is generally detectable in upper and lower  respiratory specimens during the acute phase of infection. The lowest  concentration of SARS-CoV-2 viral copies this assay can detect is 250  copies / mL. A negative result does not preclude SARS-CoV-2 infection  and should not be used as the sole basis for treatment or other  patient management decisions.  A negative result may occur with  improper specimen collection / handling, submission of specimen other  than nasopharyngeal swab, presence of viral mutation(s) within the  areas targeted by this assay, and inadequate number of viral copies  (<250 copies / mL). A negative result must be combined with clinical  observations, patient history, and epidemiological information. If result is POSITIVE SARS-CoV-2 target nucleic acids are DETECTED. The SARS-CoV-2 RNA is generally detectable in upper and lower  respiratory specimens dur ing the acute phase of infection.  Positive  results are indicative of active infection with SARS-CoV-2.  Clinical  correlation with patient history and other diagnostic information is  necessary to determine patient infection status.  Positive results  do  not rule out bacterial infection or co-infection with other viruses. If result is PRESUMPTIVE POSTIVE SARS-CoV-2 nucleic acids MAY BE PRESENT.   A presumptive positive result was obtained on the submitted specimen  and confirmed on repeat testing.  While 2019 novel coronavirus  (SARS-CoV-2) nucleic acids may be present in the submitted sample  additional confirmatory testing may be necessary for epidemiological  and / or clinical management purposes  to differentiate between  SARS-CoV-2 and other Sarbecovirus currently known to infect humans.  If clinically indicated additional testing with an alternate test  methodology [phone]) is advised. The SARS-CoV-2 RNA is generally  detectable in upper and lower respiratory sp ecimens during the acute  phase of infection. The expected result is Negative. Fact Sheet for Patients:  StrictlyIdeas.no Fact Sheet for Healthcare Providers: BankingDealers.co.za This test is not yet approved or cleared by the [place] FDA and has been authorized for detection and/or diagnosis of SARS-CoV-2 by FDA under an Emergency Use Authorization (EUA).  This EUA will remain in effect (meaning this test can be used) for the duration of the COVID-19 declaration under Section 564(b)(1) of the Act, 21 U.S.C. section 360bbb-3(b)(1), unless the authorization is terminated or revoked sooner. Performed at [facility], [place] [address], [place], [place] [address]        Radiology Studies: Dg Chest Port 1 View  Result Date: 12/29/2018 CLINICAL DATA:  Multiple abnormal labs. Multiple myeloma. Bilateral leg and arm pain. EXAM: PORTABLE CHEST 1 VIEW COMPARISON:  PET-CT dated 03/24/2018. FINDINGS: Normal sized heart. Small to moderate-sized bilateral pleural effusions and ill-defined increased density at both lung bases. Diffuse osteopenia. Lower thoracic vertebral kyphoplasty material. IMPRESSION:  Small to moderate-sized bilateral pleural effusions and bibasilar atelectasis. Electronically Signed   By: Claudie Revering M.D.   On: 12/29/2018 17:38      Scheduled Meds: . acyclovir  400 mg Oral BID  . feeding supplement (ENSURE ENLIVE)  237 mL Oral TID BM  . folic acid  1 mg Oral Daily  . gabapentin  300 mg Oral TID  . multivitamin with minerals  1 tablet Oral Daily  . thiamine  100 mg Oral Daily   Or  . thiamine  100 mg Intravenous Daily  . umeclidinium-vilanterol  1 puff Inhalation Daily   Continuous Infusions: . ceFEPime (MAXIPIME) IV 2 g (12/30/18 1053)  . metronidazole       LOS: 1 day    Time spent: 40 minutes   Dessa Phi, DO [facility] [email or web address] 12/30/2018, 2:15 PM

## 2018-12-31 ENCOUNTER — Inpatient Hospital Stay (HOSPITAL_COMMUNITY): Payer: Medicare Other

## 2018-12-31 DIAGNOSIS — E871 Hypo-osmolality and hyponatremia: Principal | ICD-10-CM

## 2018-12-31 DIAGNOSIS — K7031 Alcoholic cirrhosis of liver with ascites: Secondary | ICD-10-CM

## 2018-12-31 LAB — BASIC METABOLIC PANEL
Anion gap: 5 (ref 5–15)
BUN: 8 mg/dL (ref 8–23)
CO2: 24 mmol/L (ref 22–32)
Calcium: 6 mg/dL — CL (ref 8.9–10.3)
Chloride: 96 mmol/L — ABNORMAL LOW (ref 98–111)
Creatinine, Ser: <0.3 mg/dL — ABNORMAL LOW (ref 0.44–1.00)
Glucose, Bld: 78 mg/dL (ref 70–99)
Potassium: 3.6 mmol/L (ref 3.5–5.1)
Sodium: 125 mmol/L — ABNORMAL LOW (ref 135–145)

## 2018-12-31 LAB — CBC
HCT: 30.7 % — ABNORMAL LOW (ref 36.0–46.0)
Hemoglobin: 10.4 g/dL — ABNORMAL LOW (ref 12.0–15.0)
MCH: 35.6 pg — ABNORMAL HIGH (ref 26.0–34.0)
MCHC: 33.9 g/dL (ref 30.0–36.0)
MCV: 105.1 fL — ABNORMAL HIGH (ref 80.0–100.0)
Platelets: 177 10*3/uL (ref 150–400)
RBC: 2.92 MIL/uL — ABNORMAL LOW (ref 3.87–5.11)
RDW: 15.9 % — ABNORMAL HIGH (ref 11.5–15.5)
WBC: 8.6 10*3/uL (ref 4.0–10.5)
nRBC: 0 % (ref 0.0–0.2)

## 2018-12-31 LAB — BODY FLUID CELL COUNT WITH DIFFERENTIAL
Eos, Fluid: 0 %
Lymphs, Fluid: 3 %
Monocyte-Macrophage-Serous Fluid: 79 % (ref 50–90)
Neutrophil Count, Fluid: 18 % (ref 0–25)
Total Nucleated Cell Count, Fluid: 21 cu mm (ref 0–1000)

## 2018-12-31 LAB — HEPATIC FUNCTION PANEL
ALT: 14 U/L (ref 0–44)
AST: 32 U/L (ref 15–41)
Albumin: 1.6 g/dL — ABNORMAL LOW (ref 3.5–5.0)
Alkaline Phosphatase: 281 U/L — ABNORMAL HIGH (ref 38–126)
Bilirubin, Direct: 1.1 mg/dL — ABNORMAL HIGH (ref 0.0–0.2)
Indirect Bilirubin: 1.4 mg/dL — ABNORMAL HIGH (ref 0.3–0.9)
Total Bilirubin: 2.5 mg/dL — ABNORMAL HIGH (ref 0.3–1.2)
Total Protein: 3.5 g/dL — ABNORMAL LOW (ref 6.5–8.1)

## 2018-12-31 LAB — ALBUMIN, PLEURAL OR PERITONEAL FLUID: Albumin, Fluid: <1 g/dL

## 2018-12-31 LAB — GRAM STAIN: Gram Stain: NONE SEEN

## 2018-12-31 LAB — LACTATE DEHYDROGENASE, PLEURAL OR PERITONEAL FLUID: LD, Fluid: 13 U/L (ref 3–23)

## 2018-12-31 LAB — URINE CULTURE: Culture: NO GROWTH

## 2018-12-31 LAB — GLUCOSE, PLEURAL OR PERITONEAL FLUID: Glucose, Fluid: 89 mg/dL

## 2018-12-31 LAB — PROTIME-INR
INR: 1 (ref 0.8–1.2)
Prothrombin Time: 13.5 seconds (ref 11.4–15.2)

## 2018-12-31 MED ORDER — SPIRONOLACTONE 25 MG PO TABS
50.0000 mg | ORAL_TABLET | Freq: Every day | ORAL | Status: DC
  Administered 2018-12-31: 50 mg via ORAL
  Filled 2018-12-31: qty 2

## 2018-12-31 MED ORDER — LIDOCAINE HCL 1 % IJ SOLN
INTRAMUSCULAR | Status: AC
  Filled 2018-12-31: qty 20

## 2018-12-31 MED ORDER — FUROSEMIDE 20 MG PO TABS
20.0000 mg | ORAL_TABLET | Freq: Every day | ORAL | Status: DC
  Administered 2018-12-31: 20 mg via ORAL
  Filled 2018-12-31: qty 1

## 2018-12-31 NOTE — Consult Note (Addendum)
Referring Provider: No ref. provider found Primary Care Physician:  Vicenta Aly, [place] Primary Gastroenterologist:  Dr. Carlean Purl  Reason for Consultation:  EtOH Cirrhosis  HPI: Cassidy Barrett is a 73 y.o. female with a past medical history significant for multiple myeloma, vertebral compression fractures, prior smoker, emphysema/COPD   who presented to the hospital from the cancer center  6/4 after laboratory studies showed hypokalemia and hyponatremia. She denied having any N/V or abdominal pain at the time of her admission.   In regard to her cirrhosis, she had a televisit with Dr. Carlean Purl on 12/21/2018 with complaints of abdominal swelling. Dr. Carlean Purl re-prescribed Furosemide 85m po QD and Spironolactone 541mpo QD. She stated being off diuretics for a few months prior to her tele-vist as she did not have any abdominal or lower extremity swelling. Dr. GeCarlean Purlrdered an abdominal sonogram which was not yet done, discussed a possible paracentesis. Never had a paracentesis. Hep B and C serologies negative in 2019 and 2015.  She reports drinking 2 glasses of wine daily. In the past, she intermittently drank heavily. Past high dose NSAID use due to back compression fractures. No longer takes NSAIDS. She denies having any dysphagia, heartburn or stomach pain. No lower abdominal pain. Stools have been loose like mud for the past week. No melena or rectal bleeding. No recent antibiotics.   Labs in the ED 6/4: Na 126. K 3.6. BUN 5. Cr. 0.33. BNP 1001  Labs 12/30/2018: Sodium 127.  Potassium 3.7.  Glucose 119.  BUN 5.  Creatinine less than 0.30.  Calcium 6.5.  Anion gap 8.  Alk phos 310.  Albumin 1.9.  AST 31.  ALT 16.  Total protein 4.0.  Direct bili 1.7.  Indirect bili 2.4.  Total bili 4.1.  Lactic acid 2.3.  WBC 13.9.  Hemoglobin 11.7.  Hematocrit 32.8.  Platelet 208.  Labs 12/31/2018: Sodium 125.  Potassium 3.6.  BUN 8.  Creatinine less than 0.30.  Alk phos 281.  Albumin 1.6.  AST 32.  ALT 14.   Direct bili 1.1.  Indirect bili 1.4.  Total bili 2.5.  WBC 8.6.  Hemoglobin 10.4.  Hematocrit 30.7.  MCV 105.1.  Platelet 177.  INR 1.0   RUQ abdominal sonogram 6/5: Findings consistent with hepatic cirrhosis. No definite focal sonographic abnormality is noted. Moderate ascites is noted. Cholelithiasis is noted without definite evidence of inflammation.  EGD 03/12/2018 with Dr. GeCarlean Purlnormal, no EV or evidence of portal gastropathy.  Colonoscopy  05/24/2014: a very large 4531mubulovillous pedunculated polyp was removed from the rectum, severe diverticulosis to the sigmoid colon. A recall colonoscopy was not recommended due to her co-morbidities.   Past Medical History:  Diagnosis Date  . Alcohol abuse   . Arthritis   . Ascites   . [place] ([place] . Cholelithiasis   . [place] ([place] . Complication of anesthesia    Difficult time waking up after having biospy with colonoscopy  . 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 Compression fracture of lumbar vertebra (HCC)     L1 and L2  . Emphysema of [place] ([facility] . History of multiple myeloma    smoldering  . Hx of adenomatous polyp of rectum 05/21/2014  . Hyponatremia   . Macrocytic anemia   . 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 Osteoporosis   . Pneumonia 04/2014  . Tobacco abuse   . Tubular adenoma   . Vertebral compression fracture ([facility]  Past Surgical History:  Procedure Laterality Date  . COLONOSCOPY N/A 05/24/2014   Procedure: COLONOSCOPY;  Surgeon: Gatha Mayer, MD;  Location: [facility];  Service: Endoscopy;  Laterality: N/A;  MAC if possible ultraslim colonoscope and gastroscope needed  . FLEXIBLE SIGMOIDOSCOPY N/A 05/21/2014   Procedure: FLEXIBLE SIGMOIDOSCOPY;  Surgeon: Gatha Mayer, MD;  Location: [facility];  Service: Endoscopy;  Laterality: N/A;  . KYPHOPLASTY N/A 12/12/2014   Procedure: KYPHOPLASTY;  Surgeon: Phylliss Bob, MD;  Location: [place];  Service:  Orthopedics;  Laterality: N/A;  T10 kyphoplasty  . MANDIBLE FRACTURE SURGERY     X 2  . TONSILLECTOMY      Prior to Admission medications   Medication Sig Start Date End Date Taking? Authorizing Provider  acyclovir (ZOVIRAX) 400 MG tablet TAKE 1 TABLET(400 MG) BY MOUTH TWICE DAILY Patient taking differently: Take 400 mg by mouth 2 (two) times daily.  11/30/18  Yes Brunetta Genera, MD  albuterol (VENTOLIN HFA) 108 (90 Base) MCG/ACT inhaler INHALE 2 PUFFS INTO THE LUNGS EVERY 6 HOURS AS NEEDED FOR WHEEZING OR SHORTNESS OF BREATH Patient taking differently: Inhale 2 puffs into the lungs every 6 (six) hours as needed for wheezing or shortness of breath.  12/26/18  Yes Tanda Rockers, MD  ANORO ELLIPTA 62.5-25 MCG/INH AEPB INHALE 1 PUFF INTO THE LUNGS DAILY Patient taking differently: Inhale 1 puff into the lungs daily.  01/04/18  Yes Tanda Rockers, MD  diclofenac sodium (VOLTAREN) 1 % GEL Apply 1 g topically 3 (three) times daily.  09/06/18  Yes [provider]  ergocalciferol (VITAMIN D2) 50000 units capsule Take 1 capsule (50,000 Units total) by mouth once a week. 03/22/18  Yes Brunetta Genera, MD  furosemide (LASIX) 20 MG tablet Take 20 mg by mouth daily.    Yes [provider]  gabapentin (NEURONTIN) 300 MG capsule Take 1 capsule by mouth 3 (three) times daily. 10/18/18  Yes [provider]  Lidocaine 4 % PTCH Place 1 patch onto the skin daily as needed (severe pain). Remove & Discard patch within 12 hours or as directed by MD    Yes [provider]  OVER THE COUNTER MEDICATION Apply 400 mg topically daily as needed (arthritis/back pain). CBD cream   Yes [provider]  OVER THE COUNTER MEDICATION MAGNESIUM SPRAY 12MG--SPRAY ON FEET AS NEEDED FOR NEUROPATHY   Yes [provider]  spironolactone (ALDACTONE) 50 MG tablet TAKE 1 TABLET(50 MG) BY MOUTH DAILY Patient taking differently: Take 50 mg by mouth daily.  12/21/18  Yes Gatha Mayer, MD  ondansetron (ZOFRAN) 8 MG tablet Take 1 tablet (8 mg total) by mouth 2 (two) times daily as needed (Nausea or vomiting). Patient not taking: Reported on 12/29/2018 04/04/18   Brunetta Genera, MD  potassium chloride SA (K-DUR,KLOR-CON) 20 MEQ tablet Take 1 tablet (20 mEq total) by mouth 2 (two) times daily. Patient not taking: Reported on 12/29/2018 04/06/18   Gatha Mayer, MD    Current Facility-Administered Medications  Medication Dose Route Frequency Provider Last Rate Last Dose  . acetaminophen (TYLENOL) tablet 650 mg  650 mg Oral Q6H PRN Shela Leff, MD   325 mg at 12/30/18 0155   Or  . acetaminophen (TYLENOL) suppository 650 mg  650 mg Rectal Q6H PRN Shela Leff, MD      . acyclovir (ZOVIRAX) tablet 400 mg  400 mg Oral BID Shela Leff, MD   400 mg at 12/31/18 0008  .  ceFEPIme (MAXIPIME) 2 g in sodium chloride 0.9 % 100 mL IVPB  2 g Intravenous Q12H Shela Leff, MD 200 mL/hr at 12/31/18 0009 2 g at 12/31/18 0009  . feeding supplement (ENSURE ENLIVE) (ENSURE ENLIVE) liquid 237 mL  237 mL Oral TID BM Dessa Phi, DO      . folic acid (FOLVITE) tablet 1 mg  1 mg Oral Daily Shela Leff, MD   1 mg at 12/30/18 1047  . furosemide (LASIX) tablet 20 mg  20 mg Oral Daily Dessa Phi, DO      . gabapentin (NEURONTIN) capsule 300 mg  300 mg Oral TID Shela Leff, MD   300 mg at 12/31/18 0008  . lidocaine (LIDODERM) 5 % 3 patch  3 patch Transdermal Daily PRN Dessa Phi, DO   3 patch at 12/30/18 1308  . LORazepam (ATIVAN) tablet 1 mg  1 mg Oral Q6H PRN Shela Leff, MD       Or  . LORazepam (ATIVAN) injection 1 mg  1 mg Intravenous Q6H PRN Shela Leff, MD      . metroNIDAZOLE (FLAGYL) IVPB 500 mg  500 mg Intravenous Q8H Dessa Phi, DO 100 mL/hr at 12/31/18 0119 500 mg at 12/31/18 0119  . multivitamin with minerals tablet 1 tablet  1 tablet Oral Daily Shela Leff, MD   1 tablet at 12/30/18 1047  . ondansetron (ZOFRAN)  injection 4 mg  4 mg Intravenous Q6H PRN Dessa Phi, DO      . spironolactone (ALDACTONE) tablet 50 mg  50 mg Oral Daily Dessa Phi, DO      . thiamine (VITAMIN B-1) tablet 100 mg  100 mg Oral Daily Shela Leff, MD   100 mg at 12/30/18 1047   Or  . thiamine (B-1) injection 100 mg  100 mg Intravenous Daily Shela Leff, MD      . umeclidinium-vilanterol (ANORO ELLIPTA) 62.5-25 MCG/INH 1 puff  1 puff Inhalation Daily Shela Leff, MD   1 puff at 12/31/18 5631    Allergies as of 12/29/2018 - Review Complete 12/29/2018  Allergen Reaction Noted  . Bee venom Swelling 02/18/2018  . Benadryl [diphenhydramine]  08/13/2017  . Hydrocodone-acetaminophen    . Other  12/29/2018  . Oxycodone Other (See Comments) 10/16/2015  . Tape  05/21/2014  . Tramadol Diarrhea and Other (See Comments) 12/29/2018  . Valium [diazepam] Other (See Comments) 12/06/2014    Family History  Problem Relation Age of Onset  . Heart disease Mother   . Other Father        brain tumor  . COPD Sister   . ALS Brother   . Colon cancer Neg Hx   . Breast cancer Neg Hx   . Stomach cancer Neg Hx   . Esophageal cancer Neg Hx   . Rectal cancer Neg Hx     Social History   Socioeconomic History  . Marital status: Married    Spouse name: Not on file  . Number of children: 0  . Years of education: Not on file  . Highest education level: Not on file  Occupational History  . Occupation: retired -[employer]  Social Needs  . Financial resource strain: Not on file  . Food insecurity:    Worry: Not on file    Inability: Not on file  . Transportation needs:    Medical: Not on file    Non-medical: Not on file  Tobacco Use  . Smoking status: Current Every Day Smoker    Packs/day: 0.25  Years: 46.00    Pack years: 11.50    Types: Cigarettes    Last attempt to quit: 02/24/2017    Years since quitting: 1.8  . Smokeless tobacco: Never Used  . Tobacco comment: hemp cigs-no tabacco in them   Substance and Sexual Activity  . Alcohol use: Yes    Alcohol/week: 0.0 standard drinks    Comment: 3  . Drug use: No  . Sexual activity: Not on file  Lifestyle  . Physical activity:    Days per week: Not on file    Minutes per session: Not on file  . Stress: Not on file  Relationships  . Social connections:    Talks on phone: Not on file    Gets together: Not on file    Attends religious service: Not on file    Active member of club or organization: Not on file    Attends meetings of clubs or organizations: Not on file    Relationship status: Not on file  . Intimate partner violence:    Fear of current or ex partner: Not on file    Emotionally abused: Not on file    Physically abused: Not on file    Forced sexual activity: Not on file  Other Topics Concern  . Not on file  Social History Narrative   She is retired from [employer]   Married to [employer]   Uses alcohol heavy in the past,   Former smoker quit in 2019   No substance abuse reported    Review of Systems: See HPI, all other systems negative   Physical Exam: Vital signs in last 24 hours: Temp:  [97.9 F (36.6 C)-98.9 F (37.2 C)] 97.9 F (36.6 C) (06/05 1836) Pulse Rate:  [92-96] 96 (06/05 1836) Resp:  [18-20] 18 (06/05 1836) BP: (98-100)/(50-60) 98/60 (06/05 1836) SpO2:  [96 %-98 %] 96 % (06/06 0832) Last BM Date: 12/27/18 General:   Netty Starring female, fatigued appearing in NAD. Head:  Normocephalic and atraumatic. Eyes:  Sclera clear, no icterus. Conjunctiva pink. Ears:  Normal auditory acuity. Nose:  No deformity, discharge or lesions. Mouth:  No deformity or lesions.   Neck:  Supple. Lungs: Diminished   Breath sounds throughout. Heart:  RRR, systolic murmur. Abdomen:  Soft, nondistended, RLQ bandage post paracentesis intact, +  BS x 4 quads, no organomegaly. Rectal:  Deferred  Msk:  Symmetrical without gross deformities. . Pulses:  Normal pulses noted. Extremities: Bilateral LEs weeping, 1 +  edema, bandage to right lower leg intact. Neurologic:  Alert and  oriented x4;  grossly normal neurologically. No asterixis. Skin:  Intact without significant lesions or rashes.. Psych:  Alert and cooperative. Normal mood and affect.  Intake/Output from previous day: 06/05 0701 - 06/06 0700 In: 690 [P.O.:290; IV Piggyback:400] Out: -  Intake/Output this shift: No intake/output data recorded.  Lab Results: Recent Labs    12/29/18 1309 12/30/18 0927 12/31/18 0439  WBC 12.9* 13.9* 8.6  HGB 13.1 11.7* 10.4*  HCT 35.1* 32.8* 30.7*  PLT 275 208 177   BMET Recent Labs    12/29/18 2256 12/30/18 0554 12/31/18 0439  NA 126* 127* 125*  K 3.6 3.7 3.6  CL 88* 94* 96*  CO2 _0 GLUCOSE 133* 119* 78  BUN 5* 5* 8  CREATININE 0.33* <0.30* <0.30*  CALCIUM 6.9* 6.5* 6.0*   LFT Recent Labs    12/31/18 0439  PROT 3.5*  ALBUMIN 1.6*  AST 32  ALT 14  ALKPHOS 281*  BILITOT 2.5*  BILIDIR 1.1*  IBILI 1.4*     Studies/Results: Dg Chest Port 1 View  Result Date: 12/29/2018 CLINICAL DATA:  Multiple abnormal labs. Multiple myeloma. Bilateral leg and arm pain. EXAM: PORTABLE CHEST 1 VIEW COMPARISON:  PET-CT dated 03/24/2018. FINDINGS: Normal sized heart. Small to moderate-sized bilateral pleural effusions and ill-defined increased density at both lung bases. Diffuse osteopenia. Lower thoracic vertebral kyphoplasty material. IMPRESSION: Small to moderate-sized bilateral pleural effusions and bibasilar atelectasis. Electronically Signed   By: Claudie Revering M.D.   On: 12/29/2018 17:38   US Abdomen Limited Ruq  Result Date: 12/30/2018 CLINICAL DATA:  Elevated bilirubin level. EXAM: ULTRASOUND ABDOMEN LIMITED RIGHT UPPER QUADRANT COMPARISON:  Ultrasound of February 11, 2018. FINDINGS: Gallbladder: Cholelithiasis is noted with the largest stone measuring 6 mm. No sonographic Murphy's sign is noted. Mild gallbladder wall thickening is noted which most likely is due to adjacent hepatocellular  disease or ascites. Common bile duct: Diameter: 5 mm which is within normal limits. Liver: Increased echogenicity of hepatic parenchyma is noted with nodular hepatic contours consistent with hepatic cirrhosis. No definite focal sonographic abnormality is noted. Moderate ascites is noted. Portal vein is patent on color Doppler imaging with normal direction of blood flow towards the liver. IMPRESSION: Findings consistent with hepatic cirrhosis. No definite focal sonographic abnormality is noted. Moderate ascites is noted. Cholelithiasis is noted without definite evidence of inflammation. Electronically Signed   By: Marijo Conception M.D.   On: 12/30/2018 15:04    IMPRESSION/PLAN:  1. 73 y.o. female with EtOH cirrhosis. T. Bili 2.5. Direct bili 1.1 < Indirect bili 1.4. AP 281. AST 32. ALT 14. PLT 177. INR  1.0. She is afebrile. WBC 8.6 down from 13.9. RUQ sonogram 6/5  c/w cirrhosis, moderate ascites, gallstones without inflammation. -low volume paracentesis to rule out SBP, cell ct and differential, gram stain, culture, albumin and protein levels ordered by Dr. Maylene Roes. -continue Cefepime 2gm IV Q 12 hrs -? Continue Flagyl IV -hold Spironolactone and Furosemide --AFP, ANA, SMA, AMA and IgG in am   2. Hyponatremia secondary to cirrhosis. Na 125 -127. -hold diuretics for now  3. Hypokalemia. K+ 3.6  4. Multiple Myeloma  5. LE edema secondary to low albumin 1.6. -recommend nutritional consult if not already done  Further recommendations per Dr. Phylliss Blakes Dorathy Daft  12/31/2018, 10:13 AM

## 2018-12-31 NOTE — Plan of Care (Signed)
  Problem: Health Behavior/Discharge Planning: Goal: Ability to manage health-related needs will improve Outcome: Progressing   Problem: Clinical Measurements: Goal: Ability to maintain clinical measurements within normal limits will improve Outcome: Progressing Goal: Will remain free from infection Outcome: Progressing Goal: Diagnostic test results will improve Outcome: Progressing   Problem: Activity: Goal: Risk for activity intolerance will decrease Outcome: Progressing   Problem: Elimination: Goal: Will not experience complications related to urinary retention Outcome: Progressing   Problem: Pain Managment: Goal: General experience of comfort will improve Outcome: Progressing   Problem: Safety: Goal: Ability to remain free from injury will improve Outcome: Progressing

## 2018-12-31 NOTE — Progress Notes (Signed)
PROGRESS NOTE    Cassidy Barrett  HRC:[id number] DOB: [birth year] DOA: 12/29/2018 PCP: Vicenta Aly, FNP     Brief Narrative:  Cassidy Barrett is a 73 y.o. female with medical history significant of multiple myeloma, cirrhosis, vertebral compression fractures, emphysema, alcohol dependence who presented to the hospital from the cancer center for evaluation of abnormal labs.  Patient states she went to see Dr. Irene Limbo at the cancer center for a follow-up visit for her multiple myeloma and was told she had abnormal labs and advised to come into the hospital.  She was found to have hyponatremia, as well as hypokalemia.  New events last 24 hours / Subjective: No appetite, continues to hurt "all over" but worse in her back and her legs due to neuropathy.   Assessment & Plan:   Principal Problem:   SIRS (systemic inflammatory response syndrome) (HCC) Active Problems:   Hypokalemia   Alcoholic cirrhosis (HCC)   Hyponatremia   Bilateral lower extremity edema  Abdominal pain  -Patient has elevated bilirubin levels with chronic elevated LFT -Previous US in 2019 revealed gallstones with sludge -RUQ [place] 6/5 revealed hepatic cirrhosis, moderate ascites, cholelithiasis without evidence of inflammation -She was empirically started on vancomycin and cefepime at the time of admission, but did not have any clear source of infection at the time. Stop vanco and will add flagyl. ?SBP  -Paracentesis ordered  Acute on chronic hypoosmolar hyponatremia -Patient with chronic sodium level 129-132 -Remains low 125 today -Patient does appear fluid overloaded at this point, resume home Lasix and spironolactone, monitor BMP closely  Alcoholic liver cirrhosis -Followed by Dr. Carlean Purl -Last alcoholic beverage 6/3 -Continue CIWA protocol -[facility] consulted today  Protein calorie malnutrition -Appreciate nutrition consult  Chronic diastolic heart failure -I do not believe her fluid overload status is  secondary to acute exacerbation of her heart failure, BNP 100.  Likely secondary to severe protein malnutrition as well as cirrhosis.  Patient started on Lasix and spironolactone as above  Chronic macrocytic anemia -Continue folic acid  Multiple myeloma -Followed by Dr. Irene Limbo   Chronic pain with neuropathy -Lidocaine patch and neurontin    DVT prophylaxis: SCD Code Status: DNR Family Communication: Spoke with wife over the phone  Disposition Plan: Pending work up and improvement in lab work, GI consulted.  Paracentesis ordered for today   Consultants:   GI  Procedures:   None   Antimicrobials:  Anti-infectives (From admission, onward)   Start     Dose/Rate Route Frequency Ordered Stop   12/31/18 2100  vancomycin (VANCOCIN) IVPB 750 mg/150 ml premix  Status:  Discontinued     750 mg 150 mL/hr over 60 Minutes Intravenous Every 48 hours 12/30/18 0453 12/30/18 1413   12/30/18 1500  metroNIDAZOLE (FLAGYL) IVPB 500 mg     500 mg 100 mL/hr over 60 Minutes Intravenous Every 8 hours 12/30/18 1413     12/30/18 1000  ceFEPIme (MAXIPIME) 2 g in sodium chloride 0.9 % 100 mL IVPB     2 g 200 mL/hr over 30 Minutes Intravenous Every 12 hours 12/30/18 0453     12/29/18 2300  ceFEPIme (MAXIPIME) 2 g in sodium chloride 0.9 % 100 mL IVPB     2 g 200 mL/hr over 30 Minutes Intravenous  Once 12/29/18 2249 12/30/18 1900   12/29/18 2300  vancomycin (VANCOCIN) IVPB 750 mg/150 ml premix     750 mg 150 mL/hr over 60 Minutes Intravenous  Once 12/29/18 2249 12/30/18 1900   12/29/18 2200  acyclovir (ZOVIRAX) tablet 400 mg     400 mg Oral 2 times daily 12/29/18 2112         Objective: Vitals:   12/30/18 1215 12/30/18 1217 12/30/18 1836 12/31/18 0832  BP: (!) 100/50  98/60   Pulse: 92  96   Resp: 20  18   Temp: 98.9 F (37.2 C)  97.9 F (36.6 C)   TempSrc: Oral  Axillary   SpO2: 98% 98% 98% 96%  Weight:      Height:        Intake/Output Summary (Last 24 hours) at 12/31/2018 1119 Last  data filed at 12/31/2018 0119 Gross per 24 hour  Intake 630 ml  Output -  Net 630 ml   Filed Weights   12/29/18 1557  Weight: 33.2 kg    Examination: General exam: Appears calm and comfortable, frail appearing Respiratory system: Clear to auscultation. Respiratory effort normal. Cardiovascular system: S1 & S2 heard, RRR. No JVD, murmurs, rubs, gallops or clicks. Gastrointestinal system: Abdomen is nondistended, soft and tender to palpation lower abdomen, + ascites Central nervous system: Alert and oriented. No focal neurological deficits. Extremities: Symmetric Skin: No rashes, lesions or ulcers Psychiatry: Judgement and insight appear normal. Mood & affect appropriate.    Data Reviewed: I have personally reviewed following labs and imaging studies  CBC: Recent Labs  Lab 12/29/18 1309 12/30/18 0927 12/31/18 0439  WBC 12.9* 13.9* 8.6  NEUTROABS 10.8*  --   --   HGB 13.1 11.7* 10.4*  HCT 35.1* 32.8* 30.7*  MCV 95.4 102.5* 105.1*  PLT 275 208 765   Basic Metabolic Panel: Recent Labs  Lab 12/29/18 1309 12/29/18 1818 12/29/18 2256 12/30/18 0554 12/31/18 0439  NA 125*  --  126* 127* 125*  K 2.8*  --  3.6 3.7 3.6  CL 83*  --  88* 94* 96*  CO2 31  --  '26 25 24  '$ GLUCOSE 136*  --  133* 119* 78  BUN <4*  --  5* 5* 8  CREATININE 0.49  --  0.33* <0.30* <0.30*  CALCIUM 7.3*  --  6.9* 6.5* 6.0*  MG  --  1.7  --   --   --    GFR: CrCl cannot be calculated (This lab value cannot be used to calculate CrCl because it is not a number: <0.30). Liver Function Tests: Recent Labs  Lab 12/29/18 1309 12/30/18 0554 12/31/18 0439  AST 32 31 32  ALT '15 16 14  '$ ALKPHOS 457* 310* 281*  BILITOT 3.1* 4.1* 2.5*  PROT 4.5* 4.0* 3.5*  ALBUMIN 2.2* 1.9* 1.6*   No results for input(s): LIPASE, AMYLASE in the last 168 hours. No results for input(s): AMMONIA in the last 168 hours. Coagulation Profile: No results for input(s): INR, PROTIME in the last 168 hours. Cardiac Enzymes: Recent  Labs  Lab 12/29/18 1818  TROPONINI <0.03   BNP (last 3 results) No results for input(s): PROBNP in the last 8760 hours. HbA1C: No results for input(s): HGBA1C in the last 72 hours. CBG: No results for input(s): GLUCAP in the last 168 hours. Lipid Profile: No results for input(s): CHOL, HDL, LDLCALC, TRIG, CHOLHDL, LDLDIRECT in the last 72 hours. Thyroid Function Tests: Recent Labs    12/29/18 2256  TSH 2.131   Anemia Panel: No results for input(s): VITAMINB12, FOLATE, FERRITIN, TIBC, IRON, RETICCTPCT in the last 72 hours. Sepsis Labs: Recent Labs  Lab 12/29/18 1839 12/29/18 2256 12/30/18 0554  PROCALCITON  --  0.15  --  LATICACIDVEN 3.3* 3.9* 2.3*    Recent Results (from the past 240 hour(s))  SARS Coronavirus 2 (CEPHEID - Performed in [facility] hospital lab), Hosp Order     Status: None   Collection Time: 12/29/18  4:22 PM  Result Value Ref Range Status   SARS Coronavirus 2 NEGATIVE NEGATIVE Final    Comment: (NOTE) If result is NEGATIVE SARS-CoV-2 target nucleic acids are NOT DETECTED. The SARS-CoV-2 RNA is generally detectable in upper and lower  respiratory specimens during the acute phase of infection. The lowest  concentration of SARS-CoV-2 viral copies this assay can detect is 250  copies / mL. A negative result does not preclude SARS-CoV-2 infection  and should not be used as the sole basis for treatment or other  patient management decisions.  A negative result may occur with  improper specimen collection / handling, submission of specimen other  than nasopharyngeal swab, presence of viral mutation(s) within the  areas targeted by this assay, and inadequate number of viral copies  (<250 copies / mL). A negative result must be combined with clinical  observations, patient history, and epidemiological information. If result is POSITIVE SARS-CoV-2 target nucleic acids are DETECTED. The SARS-CoV-2 RNA is generally detectable in upper and lower  respiratory  specimens dur ing the acute phase of infection.  Positive  results are indicative of active infection with SARS-CoV-2.  Clinical  correlation with patient history and other diagnostic information is  necessary to determine patient infection status.  Positive results do  not rule out bacterial infection or co-infection with other viruses. If result is PRESUMPTIVE POSTIVE SARS-CoV-2 nucleic acids MAY BE PRESENT.   A presumptive positive result was obtained on the submitted specimen  and confirmed on repeat testing.  While 2019 novel coronavirus  (SARS-CoV-2) nucleic acids may be present in the submitted sample  additional confirmatory testing may be necessary for epidemiological  and / or clinical management purposes  to differentiate between  SARS-CoV-2 and other Sarbecovirus currently known to infect humans.  If clinically indicated additional testing with an alternate test  methodology [phone]) is advised. The SARS-CoV-2 RNA is generally  detectable in upper and lower respiratory sp ecimens during the acute  phase of infection. The expected result is Negative. Fact Sheet for Patients:  StrictlyIdeas.no Fact Sheet for Healthcare Providers: BankingDealers.co.za This test is not yet approved or cleared by the [place] FDA and has been authorized for detection and/or diagnosis of SARS-CoV-2 by FDA under an Emergency Use Authorization (EUA).  This EUA will remain in effect (meaning this test can be used) for the duration of the COVID-19 declaration under Section 564(b)(1) of the Act, 21 U.S.C. section 360bbb-3(b)(1), unless the authorization is terminated or revoked sooner. Performed at [facility], [place] [address] [place], [place] [address]   Culture, Urine     Status: None   Collection Time: 12/30/18  3:36 AM  Result Value Ref Range Status   Specimen Description   Final    URINE, CATHETERIZED Performed at  [facility] [address] [place], [place] [address]    Special Requests   Final    NONE Performed at [facility], [place] [address], [place], [place] [address]    Culture   Final    NO GROWTH Performed at [facility], [place] [address] [place], [place] [address]    Report Status 12/31/2018 FINAL  Final  Culture, blood (routine x 2)     Status:  None (Preliminary result)   Collection Time: 12/30/18  5:54 AM  Result Value Ref Range Status   Specimen Description   Final    BLOOD LEFT ARM Performed at [facility], [address] [place], [place] [address]    Special Requests   Final    BOTTLES DRAWN AEROBIC ONLY Blood Culture adequate volume Performed at [facility] [address], [place], [place] [address]    Culture   Final    NO GROWTH < 24 HOURS Performed at [facility] [address] [place], [place] [address]    Report Status PENDING  Incomplete  Culture, blood (routine x 2)     Status: None (Preliminary result)   Collection Time: 12/30/18  9:27 AM  Result Value Ref Range Status   Specimen Description   Final    BLOOD LEFT ARM Performed at [facility], [place] [address] [place], [place] [address]    Special Requests   Final    BOTTLES DRAWN AEROBIC ONLY Blood Culture results may not be optimal due to an inadequate volume of blood received in culture bottles Performed at [facility] [address], [place], [place] [address]    Culture   Final    NO GROWTH < 24 HOURS Performed at [facility] [address], [place],  [address]    Report Status PENDING  Incomplete       Radiology Studies: Dg Chest Port 1 View  Result Date: 12/29/2018 CLINICAL DATA:  Multiple abnormal labs. Multiple myeloma. Bilateral leg and arm pain. EXAM: PORTABLE CHEST 1 VIEW COMPARISON:  PET-CT dated 03/24/2018. FINDINGS: Normal sized heart. Small to moderate-sized bilateral  pleural effusions and ill-defined increased density at both lung bases. Diffuse osteopenia. Lower thoracic vertebral kyphoplasty material. IMPRESSION: Small to moderate-sized bilateral pleural effusions and bibasilar atelectasis. Electronically Signed   By: Claudie Revering M.D.   On: 12/29/2018 17:38   US Abdomen Limited Ruq  Result Date: 12/30/2018 CLINICAL DATA:  Elevated bilirubin level. EXAM: ULTRASOUND ABDOMEN LIMITED RIGHT UPPER QUADRANT COMPARISON:  Ultrasound of February 11, 2018. FINDINGS: Gallbladder: Cholelithiasis is noted with the largest stone measuring 6 mm. No sonographic Murphy's sign is noted. Mild gallbladder wall thickening is noted which most likely is due to adjacent hepatocellular disease or ascites. Common bile duct: Diameter: 5 mm which is within normal limits. Liver: Increased echogenicity of hepatic parenchyma is noted with nodular hepatic contours consistent with hepatic cirrhosis. No definite focal sonographic abnormality is noted. Moderate ascites is noted. Portal vein is patent on color Doppler imaging with normal direction of blood flow towards the liver. IMPRESSION: Findings consistent with hepatic cirrhosis. No definite focal sonographic abnormality is noted. Moderate ascites is noted. Cholelithiasis is noted without definite evidence of inflammation. Electronically Signed   By: Marijo Conception M.D.   On: 12/30/2018 15:04      Scheduled Meds: . acyclovir  400 mg Oral BID  . feeding supplement (ENSURE ENLIVE)  237 mL Oral TID BM  . folic acid  1 mg Oral Daily  . furosemide  20 mg Oral Daily  . gabapentin  300 mg Oral TID  . multivitamin with minerals  1 tablet Oral Daily  . spironolactone  50 mg Oral Daily  . thiamine  100 mg Oral Daily   Or  . thiamine  100 mg Intravenous Daily  . umeclidinium-vilanterol  1 puff Inhalation Daily   Continuous Infusions: . ceFEPime (MAXIPIME)  IV 2 g (12/31/18 0009)  . metronidazole 500 mg (12/31/18 0119)     LOS: 2 days    Time  spent: 25 minutes   Dessa Phi, DO [facility] [email or web address] 12/31/2018, 11:19 AM

## 2018-12-31 NOTE — Procedures (Signed)
Interventional Radiology Procedure:    Indications: Ascites.  Procedure: US guided paracentesis  Findings: Removed 2500 ml from left lower quadrant  Complications: None     EBL: Less than 10 ml   Gary Bultman R. Anselm Pancoast, MD  Pager: [phone]

## 2019-01-01 DIAGNOSIS — F10188 Alcohol abuse with other alcohol-induced disorder: Secondary | ICD-10-CM

## 2019-01-01 DIAGNOSIS — I5032 Chronic diastolic (congestive) heart failure: Secondary | ICD-10-CM

## 2019-01-01 LAB — BASIC METABOLIC PANEL
Anion gap: 8 (ref 5–15)
BUN: 11 mg/dL (ref 8–23)
CO2: 24 mmol/L (ref 22–32)
Calcium: 6.4 mg/dL — CL (ref 8.9–10.3)
Chloride: 94 mmol/L — ABNORMAL LOW (ref 98–111)
Creatinine, Ser: 0.31 mg/dL — ABNORMAL LOW (ref 0.44–1.00)
GFR calc Af Amer: >60 mL/min (ref >60)
GFR calc non Af Amer: >60 mL/min (ref >60)
Glucose, Bld: 83 mg/dL (ref 70–99)
Potassium: 3 mmol/L — ABNORMAL LOW (ref 3.5–5.1)
Sodium: 126 mmol/L — ABNORMAL LOW (ref 135–145)

## 2019-01-01 LAB — CBC
HCT: 33.8 % — ABNORMAL LOW (ref 36.0–46.0)
Hemoglobin: 11.9 g/dL — ABNORMAL LOW (ref 12.0–15.0)
MCH: 35.1 pg — ABNORMAL HIGH (ref 26.0–34.0)
MCHC: 35.2 g/dL (ref 30.0–36.0)
MCV: 99.7 fL (ref 80.0–100.0)
Platelets: 217 10*3/uL (ref 150–400)
RBC: 3.39 MIL/uL — ABNORMAL LOW (ref 3.87–5.11)
RDW: 15 % (ref 11.5–15.5)
WBC: 8.7 10*3/uL (ref 4.0–10.5)
nRBC: 0 % (ref 0.0–0.2)

## 2019-01-01 LAB — HEPATIC FUNCTION PANEL
ALT: 15 U/L (ref 0–44)
AST: 33 U/L (ref 15–41)
Albumin: 1.8 g/dL — ABNORMAL LOW (ref 3.5–5.0)
Alkaline Phosphatase: 304 U/L — ABNORMAL HIGH (ref 38–126)
Bilirubin, Direct: 1.1 mg/dL — ABNORMAL HIGH (ref 0.0–0.2)
Indirect Bilirubin: 1 mg/dL — ABNORMAL HIGH (ref 0.3–0.9)
Total Bilirubin: 2.1 mg/dL — ABNORMAL HIGH (ref 0.3–1.2)
Total Protein: 3.9 g/dL — ABNORMAL LOW (ref 6.5–8.1)

## 2019-01-01 LAB — GAMMA GT: GGT: 188 U/L — ABNORMAL HIGH (ref 7–50)

## 2019-01-01 MED ORDER — CALCIUM CARBONATE ANTACID 500 MG PO CHEW
400.0000 mg | CHEWABLE_TABLET | Freq: Two times a day (BID) | ORAL | Status: DC
  Administered 2019-01-01 – 2019-01-02 (×2): 400 mg via ORAL
  Filled 2019-01-01 (×3): qty 2

## 2019-01-01 MED ORDER — POTASSIUM CHLORIDE CRYS ER 20 MEQ PO TBCR
40.0000 meq | EXTENDED_RELEASE_TABLET | ORAL | Status: AC
  Administered 2019-01-01 (×2): 40 meq via ORAL
  Filled 2019-01-01 (×2): qty 2

## 2019-01-01 NOTE — Progress Notes (Signed)
Report received from [place], [place]. Will continue to monitor pt closely. Carnella Gu[place]ajara I

## 2019-01-01 NOTE — Progress Notes (Signed)
Notified Dr. Maylene Roes that patient had only voided 161mL during shift. Bladder scan ordered with in and out cath if bladder volume greater than 319mLs. Bladder scan noted 330 mLs of urine. In and out cath only drained 40 mLs possible due to bladder scan detecting pt's ascites.  Dr Maylene Roes notified and foley catheter insertion ordered so accurate output can be documented.

## 2019-01-01 NOTE — Progress Notes (Signed)
CRITICAL VALUE ALERT  Critical Value:  Calcium 6.4  Date & Time Notied:  01/01/19 @ 1040  Provider Notified: Dr. Maylene Roes  Orders Received/Actions taken: none

## 2019-01-01 NOTE — Progress Notes (Signed)
[facility] Progress Note  CC: Etoh Cirrhosis  Subjective: she reports feeling better today, slept 4 hours. No abdominal pain.   Objective:  Vital signs in last 24 hours: Temp:  [97.7 F (36.5 C)-98.1 F (36.7 C)] 98.1 F (36.7 C) (06/07 0525) Pulse Rate:  [96-109] 96 (06/07 0525) Resp:  [16-18] 18 (06/07 0525) BP: (81-111)/(52-94) 81/52 (06/07 0525) SpO2:  [96 %-97 %] 97 % (06/07 0525) Last BM Date: 12/27/18 General:   Alert,  Well-developed,    in NAD Heart: RRR, systolic murmur. Pulm: Lungs clear, decreased in the bases. Abdomen: Soft, protuberant, no significant ascites, nontender, weeping serous fluid. Extremities:  LEs bandaged, weeping serous fluid, no significant edema. UEs with 1 + edema, several skin tears and patches of ecchymosis. Neurologic:  Alert and  oriented x4;  grossly normal neurologically. Psych:  Alert and cooperative. Normal mood and affect.  Intake/Output from previous day: 06/06 0701 - 06/07 0700 In: 540 [P.O.:340; IV Piggyback:200] Out: 1400 [Urine:1400] Intake/Output this shift: No intake/output data recorded.  Lab Results: Recent Labs    12/30/18 0927 12/31/18 0439 01/01/19 0740  WBC 13.9* 8.6 8.7  HGB 11.7* 10.4* 11.9*  HCT 32.8* 30.7* 33.8*  PLT 208 177 217   BMET Recent Labs    12/30/18 0554 12/31/18 0439 01/01/19 0812  NA 127* 125* 126*  K 3.7 3.6 3.0*  CL 94* 96* 94*  CO2 '25 24 24  '$ GLUCOSE 119* 78 83  BUN 5* 8 11  CREATININE <0.30* <0.30* 0.31*  CALCIUM 6.5* 6.0* 6.4*   LFT Recent Labs    01/01/19 0812  PROT 3.9*  ALBUMIN 1.8*  AST 33  ALT 15  ALKPHOS 304*  BILITOT 2.1*  BILIDIR 1.1*  IBILI 1.0*   PT/INR Recent Labs    12/31/18 1019  LABPROT 13.5  INR 1.0   Hepatitis Panel No results for input(s): HEPBSAG, HCVAB, HEPAIGM, HEPBIGM in the last 72 hours.  US Paracentesis  Result Date: 12/31/2018 INDICATION: 73 year old with ascites.  Multiple myeloma.  Cirrhosis. EXAM: ULTRASOUND  GUIDED PARACENTESIS MEDICATIONS: None. COMPLICATIONS: None immediate. PROCEDURE: Informed written consent was obtained from the patient after a discussion of the risks, benefits and alternatives to treatment. A timeout was performed prior to the initiation of the procedure. Initial ultrasound scanning demonstrates a large amount of ascites within the left lower abdominal quadrant. The left lower abdomen was prepped and draped in the usual sterile fashion. 1% lidocaine with was used for local anesthesia. Following this, a 6 Fr Safe-T-Centesis catheter was introduced. An ultrasound image was saved for documentation purposes. The paracentesis was performed. The catheter was removed and a dressing was applied. The patient tolerated the procedure well without immediate post procedural complication. FINDINGS: A total of approximately 2.5 L of yellow fluid was removed. Samples were sent to the laboratory as requested by the clinical team. IMPRESSION: Successful ultrasound-guided paracentesis yielding 2.5 liters of peritoneal fluid. Electronically Signed   By: Markus Daft M.D.   On: 12/31/2018 14:24   US Abdomen Limited Ruq  Result Date: 12/30/2018 CLINICAL DATA:  Elevated bilirubin level. EXAM: ULTRASOUND ABDOMEN LIMITED RIGHT UPPER QUADRANT COMPARISON:  Ultrasound of February 11, 2018. FINDINGS: Gallbladder: Cholelithiasis is noted with the largest stone measuring 6 mm. No sonographic Murphy's sign is noted. Mild gallbladder wall thickening is noted which most likely is due to adjacent hepatocellular disease or ascites. Common bile duct: Diameter: 5 mm which is within normal limits. Liver: Increased echogenicity of hepatic parenchyma is noted  with nodular hepatic contours consistent with hepatic cirrhosis. No definite focal sonographic abnormality is noted. Moderate ascites is noted. Portal vein is patent on color Doppler imaging with normal direction of blood flow towards the liver. IMPRESSION: Findings consistent with  hepatic cirrhosis. No definite focal sonographic abnormality is noted. Moderate ascites is noted. Cholelithiasis is noted without definite evidence of inflammation. Electronically Signed   By: Marijo Conception M.D.   On: 12/30/2018 15:04    Assessment / Plan:  1. 73 y.o. female with EtOH cirrhosis. down from 13.9. RUQ sonogram 6/5  c/w cirrhosis, moderate ascites, gallstones without cholecystitis. S/P paracentesis 6/6, 2.5 L peritoneal fluid remove. No SBP.  AP 305. AST 33. ALT 15. T. Bili 2.1. Direct bili 1.1. ANA, SMA, AMA and IgG pending.  -No alcohol -2gm low sodium diet -IVF per hospitalist once IV access obtained -monitor urine output as patient is at risk for dehydration/AKI, minimal urine output today -labs to be collected once IV access available  -will need appt with Dr. Carlean Purl once discharged for cirrhosis follow up  2. Hyponatremia secondary to cirrhosis. Na+ 126. -hold diuretics for now  3. Hypokalemia. K+ 3.0. -KCL po replacement ordered by hospitalist   4. Multiple Myeloma  5. Low albumin levels secondary to severe malnutrition and cirrhosis resulting continuous weeping of serous fluid from extremities and abdomen. Albumin 1.8. -recommend wound care consult if not already following  -appreciated nutritionist recommendations   Further recommendations per Dr. Havery Moros      LOS: 3 days   Noralyn Pick  01/01/2019, 11:42 AM

## 2019-01-01 NOTE — Progress Notes (Addendum)
PROGRESS NOTE    CARLENE BICKLEY  ZCH:[id number] DOB: 02/29/1946 DOA: 12/29/2018 PCP: Vicenta Aly, FNP     Brief Narrative:  Cassidy DANKER is a 73 y.o. female with medical history significant of multiple myeloma, cirrhosis, vertebral compression fractures, emphysema, alcohol dependence who presented to the hospital from the cancer center for evaluation of abnormal labs.  Patient states she went to see Dr. Irene Limbo at the cancer center for a follow-up visit for her multiple myeloma and was told she had abnormal labs and advised to come into the hospital.  She was found to have hyponatremia, as well as hypokalemia.  New events last 24 hours / Subjective: Feeling improved today after paracentesis yesterday.  She is about to eat breakfast, in good spirits overall.  Continues to have weeping skin on her right upper and right lower extremities which have been wrapped  Assessment & Plan:   Principal Problem:   Alcoholic cirrhosis (HCC) Active Problems:   Tobacco abuse   Alcohol abuse   Hypokalemia   Multiple myeloma (HCC)   Hyponatremia   SIRS (systemic inflammatory response syndrome) (HCC)   Bilateral lower extremity edema  Alcoholic liver cirrhosis with ascites -Followed by Dr. Carlean Purl.  GI consulted and following  -Last alcoholic beverage 6/3. Continue CIWA protocol, but does not appear to be withdrawing at this time -Patient has elevated bilirubin levels with chronic elevated LFT -Previous US in 2019 revealed gallstones with sludge -RUQ [place] 6/5 revealed hepatic cirrhosis, moderate ascites, cholelithiasis without evidence of inflammation -S/p paracentesis with 2.5L fluid removal, negative for SBP. Antibiotics stopped. Sepsis ruled out  -Lasix, spironolactone on hold, BMP pending this morning   Acute on chronic hypoosmolar hyponatremia -Patient with chronic sodium level 129-132 -Lasix, spironolactone on hold, BMP pending this morning   Protein calorie malnutrition -Appreciate  nutrition consult  Chronic diastolic heart failure -I do not believe her fluid overload status is secondary to acute exacerbation of her heart failure, BNP 100.  Likely secondary to severe protein malnutrition as well as cirrhosis. Stable overall.   Chronic macrocytic anemia -Continue folic acid  Multiple myeloma -Followed by Dr. Irene Limbo   Chronic pain with neuropathy -Lidocaine patch and neurontin    DVT prophylaxis: SCD Code Status: DNR Family Communication: Spoke with wife over the phone  Disposition Plan: Pending further GI recommendation. Hopeful discharge home next 1-2 days pending lab work.    Consultants:   GI  Procedures:   Paracentesis 6/6   Antimicrobials:  Anti-infectives (From admission, onward)   Start     Dose/Rate Route Frequency Ordered Stop   12/31/18 2100  vancomycin (VANCOCIN) IVPB 750 mg/150 ml premix  Status:  Discontinued     750 mg 150 mL/hr over 60 Minutes Intravenous Every 48 hours 12/30/18 0453 12/30/18 1413   12/30/18 1500  metroNIDAZOLE (FLAGYL) IVPB 500 mg  Status:  Discontinued     500 mg 100 mL/hr over 60 Minutes Intravenous Every 8 hours 12/30/18 1413 12/31/18 1357   12/30/18 1000  ceFEPIme (MAXIPIME) 2 g in sodium chloride 0.9 % 100 mL IVPB  Status:  Discontinued     2 g 200 mL/hr over 30 Minutes Intravenous Every 12 hours 12/30/18 0453 01/01/19 0736   12/29/18 2300  ceFEPIme (MAXIPIME) 2 g in sodium chloride 0.9 % 100 mL IVPB     2 g 200 mL/hr over 30 Minutes Intravenous  Once 12/29/18 2249 12/30/18 1900   12/29/18 2300  vancomycin (VANCOCIN) IVPB 750 mg/150 ml premix  750 mg 150 mL/hr over 60 Minutes Intravenous  Once 12/29/18 2249 12/30/18 1900   12/29/18 2200  acyclovir (ZOVIRAX) tablet 400 mg     400 mg Oral 2 times daily 12/29/18 2112         Objective: Vitals:   12/31/18 1301 12/31/18 1339 12/31/18 2113 01/01/19 0525  BP: _0 (!) 81/52  Pulse:  100 (!) 109 96  Resp:  _1 Temp:  97.7 F (36.5 C)  97.7 F (36.5 C) 98.1 F (36.7 C)  TempSrc:  Oral Oral Oral  SpO2:  96% 97% 97%  Weight:      Height:        Intake/Output Summary (Last 24 hours) at 01/01/2019 1018 Last data filed at 01/01/2019 0526 Gross per 24 hour  Intake 420 ml  Output 1400 ml  Net -980 ml   Filed Weights   12/29/18 1557  Weight: 33.2 kg    Examination: General exam: Appears calm and comfortable  Respiratory system: Clear to auscultation. Respiratory effort normal. Cardiovascular system: S1 & S2 heard, RRR. No JVD, murmurs, rubs, gallops or clicks. No pedal edema. Gastrointestinal system: Abdomen is nondistended, soft and nontender. No organomegaly or masses felt. Normal bowel sounds heard. Central nervous system: Alert and oriented. No focal neurological deficits. Extremities: Symmetric Skin: RUE and RLE dressed in areas of weeping skin  Psychiatry: Judgement and insight appear normal. Mood & affect appropriate.    Data Reviewed: I have personally reviewed following labs and imaging studies  CBC: Recent Labs  Lab 12/29/18 1309 12/30/18 0927 12/31/18 0439 01/01/19 0740  WBC 12.9* 13.9* 8.6 8.7  NEUTROABS 10.8*  --   --   --   HGB 13.1 11.7* 10.4* 11.9*  HCT 35.1* 32.8* 30.7* 33.8*  MCV 95.4 102.5* 105.1* 99.7  PLT 275 208 177 923   Basic Metabolic Panel: Recent Labs  Lab 12/29/18 1309 12/29/18 1818 12/29/18 2256 12/30/18 0554 12/31/18 0439  NA 125*  --  126* 127* 125*  K 2.8*  --  3.6 3.7 3.6  CL 83*  --  88* 94* 96*  CO2 31  --  _2 GLUCOSE 136*  --  133* 119* 78  BUN <4*  --  5* 5* 8  CREATININE 0.49  --  0.33* <0.30* <0.30*  CALCIUM 7.3*  --  6.9* 6.5* 6.0*  MG  --  1.7  --   --   --    GFR: CrCl cannot be calculated (This lab value cannot be used to calculate CrCl because it is not a number: <0.30). Liver Function Tests: Recent Labs  Lab 12/29/18 1309 12/30/18 0554 12/31/18 0439  AST 32 31 32  ALT _3 ALKPHOS 457* 310* 281*  BILITOT 3.1* 4.1* 2.5*  PROT  4.5* 4.0* 3.5*  ALBUMIN 2.2* 1.9* 1.6*   No results for input(s): LIPASE, AMYLASE in the last 168 hours. No results for input(s): AMMONIA in the last 168 hours. Coagulation Profile: Recent Labs  Lab 12/31/18 1019  INR 1.0   Cardiac Enzymes: Recent Labs  Lab 12/29/18 1818  TROPONINI <0.03   BNP (last 3 results) No results for input(s): PROBNP in the last 8760 hours. HbA1C: No results for input(s): HGBA1C in the last 72 hours. CBG: No results for input(s): GLUCAP in the last 168 hours. Lipid Profile: No results for input(s): CHOL, HDL, LDLCALC, TRIG, CHOLHDL, LDLDIRECT in the last 72 hours. Thyroid Function Tests: Recent Labs  12/29/18 2256  TSH 2.131   Anemia Panel: No results for input(s): VITAMINB12, FOLATE, FERRITIN, TIBC, IRON, RETICCTPCT in the last 72 hours. Sepsis Labs: Recent Labs  Lab 12/29/18 1839 12/29/18 2256 12/30/18 0554  PROCALCITON  --  0.15  --   LATICACIDVEN 3.3* 3.9* 2.3*    Recent Results (from the past 240 hour(s))  SARS Coronavirus 2 (CEPHEID - Performed in [facility] hospital lab), Hosp Order     Status: None   Collection Time: 12/29/18  4:22 PM  Result Value Ref Range Status   SARS Coronavirus 2 NEGATIVE NEGATIVE Final    Comment: (NOTE) If result is NEGATIVE SARS-CoV-2 target nucleic acids are NOT DETECTED. The SARS-CoV-2 RNA is generally detectable in upper and lower  respiratory specimens during the acute phase of infection. The lowest  concentration of SARS-CoV-2 viral copies this assay can detect is 250  copies / mL. A negative result does not preclude SARS-CoV-2 infection  and should not be used as the sole basis for treatment or other  patient management decisions.  A negative result may occur with  improper specimen collection / handling, submission of specimen other  than nasopharyngeal swab, presence of viral mutation(s) within the  areas targeted by this assay, and inadequate number of viral copies  (<250 copies / mL). A  negative result must be combined with clinical  observations, patient history, and epidemiological information. If result is POSITIVE SARS-CoV-2 target nucleic acids are DETECTED. The SARS-CoV-2 RNA is generally detectable in upper and lower  respiratory specimens dur ing the acute phase of infection.  Positive  results are indicative of active infection with SARS-CoV-2.  Clinical  correlation with patient history and other diagnostic information is  necessary to determine patient infection status.  Positive results do  not rule out bacterial infection or co-infection with other viruses. If result is PRESUMPTIVE POSTIVE SARS-CoV-2 nucleic acids MAY BE PRESENT.   A presumptive positive result was obtained on the submitted specimen  and confirmed on repeat testing.  While 2019 novel coronavirus  (SARS-CoV-2) nucleic acids may be present in the submitted sample  additional confirmatory testing may be necessary for epidemiological  and / or clinical management purposes  to differentiate between  SARS-CoV-2 and other Sarbecovirus currently known to infect humans.  If clinically indicated additional testing with an alternate test  methodology [phone]) is advised. The SARS-CoV-2 RNA is generally  detectable in upper and lower respiratory sp ecimens during the acute  phase of infection. The expected result is Negative. Fact Sheet for Patients:  StrictlyIdeas.no Fact Sheet for Healthcare Providers: BankingDealers.co.za This test is not yet approved or cleared by the [place] FDA and has been authorized for detection and/or diagnosis of SARS-CoV-2 by FDA under an Emergency Use Authorization (EUA).  This EUA will remain in effect (meaning this test can be used) for the duration of the COVID-19 declaration under Section 564(b)(1) of the Act, 21 U.S.C. section 360bbb-3(b)(1), unless the authorization is terminated or revoked sooner. Performed  at [facility], [place] [address], [place], [place] [address]   Culture, Urine     Status: None   Collection Time: 12/30/18  3:36 AM  Result Value Ref Range Status   Specimen Description   Final    URINE, CATHETERIZED Performed at [place] [address], [place], [place] [address]    Special Requests   Final    NONE Performed at [facility], [place] [address] [place], [place]  [address]    Culture   Final    NO GROWTH Performed at [facility], [place] [address] [place], [place] [address]    Report Status 12/31/2018 FINAL  Final  Culture, blood (routine x 2)     Status: None (Preliminary result)   Collection Time: 12/30/18  5:54 AM  Result Value Ref Range Status   Specimen Description   Final    BLOOD LEFT ARM Performed at [facility], [place] [address], [place], [place] [address]    Special Requests   Final    BOTTLES DRAWN AEROBIC ONLY Blood Culture adequate volume Performed at [facility] [address] [place], [place] [address]    Culture   Final    NO GROWTH 2 DAYS Performed at [place] [facility] [address] [place], [place] [address]    Report Status PENDING  Incomplete  Culture, blood (routine x 2)     Status: None (Preliminary result)   Collection Time: 12/30/18  9:27 AM  Result Value Ref Range Status   Specimen Description   Final    BLOOD LEFT ARM Performed at [facility], [place] [address] [place], [place] [address]    Special Requests   Final    BOTTLES DRAWN AEROBIC ONLY Blood Culture results may not be optimal due to an inadequate volume of blood received in culture bottles Performed at [facility] [address], [place], [place] [address]    Culture   Final    NO GROWTH 2 DAYS Performed at [facility] [address], [place], [place] [address]    Report Status PENDING  Incomplete  Gram stain     Status: None   Collection  Time: 12/31/18  2:00 PM  Result Value Ref Range Status   Specimen Description FLUID  Final   Special Requests NONE  Final   Gram Stain   Final    NO WBC SEEN NO ORGANISMS SEEN Performed at [facility], [place] [address], [place], [place] [address]    Report Status 12/31/2018 FINAL  Final  Culture, body fluid-bottle     Status: None (Preliminary result)   Collection Time: 12/31/18  2:00 PM  Result Value Ref Range Status   Specimen Description FLUID  Final   Special Requests NONE  Final   Culture   Final    NO GROWTH < 24 HOURS Performed at [facility], [place] [address], [place], [place] [address]    Report Status PENDING  Incomplete       Radiology Studies: US Paracentesis  Result Date: 12/31/2018 INDICATION: 73 year old with ascites.  Multiple myeloma.  Cirrhosis. EXAM: ULTRASOUND GUIDED PARACENTESIS MEDICATIONS: None. COMPLICATIONS: None immediate. PROCEDURE: Informed written consent was obtained from the patient after a discussion of the risks, benefits and alternatives to treatment. A timeout was performed prior to the initiation of the procedure. Initial ultrasound scanning demonstrates a large amount of ascites within the left lower abdominal quadrant. The left lower abdomen was prepped and draped in the usual sterile fashion. 1% lidocaine with was used for local anesthesia. Following this, a 6 Fr Safe-T-Centesis catheter was introduced. An ultrasound image was saved for documentation purposes. The paracentesis was performed. The catheter was removed and a dressing was applied. The patient tolerated the procedure well without immediate post procedural complication. FINDINGS: A total of approximately 2.5 L of yellow fluid was removed. Samples were sent to the laboratory  as requested by the clinical team. IMPRESSION: Successful ultrasound-guided paracentesis yielding 2.5 liters of peritoneal fluid. Electronically Signed   By: Markus Daft M.D.   On: 12/31/2018 14:24   US Abdomen  Limited Ruq  Result Date: 12/30/2018 CLINICAL DATA:  Elevated bilirubin level. EXAM: ULTRASOUND ABDOMEN LIMITED RIGHT UPPER QUADRANT COMPARISON:  Ultrasound of February 11, 2018. FINDINGS: Gallbladder: Cholelithiasis is noted with the largest stone measuring 6 mm. No sonographic Murphy's sign is noted. Mild gallbladder wall thickening is noted which most likely is due to adjacent hepatocellular disease or ascites. Common bile duct: Diameter: 5 mm which is within normal limits. Liver: Increased echogenicity of hepatic parenchyma is noted with nodular hepatic contours consistent with hepatic cirrhosis. No definite focal sonographic abnormality is noted. Moderate ascites is noted. Portal vein is patent on color Doppler imaging with normal direction of blood flow towards the liver. IMPRESSION: Findings consistent with hepatic cirrhosis. No definite focal sonographic abnormality is noted. Moderate ascites is noted. Cholelithiasis is noted without definite evidence of inflammation. Electronically Signed   By: Marijo Conception M.D.   On: 12/30/2018 15:04      Scheduled Meds:  acyclovir  400 mg Oral BID   feeding supplement (ENSURE ENLIVE)  237 mL Oral TID BM   folic acid  1 mg Oral Daily   gabapentin  300 mg Oral TID   multivitamin with minerals  1 tablet Oral Daily   thiamine  100 mg Oral Daily   Or   thiamine  100 mg Intravenous Daily   umeclidinium-vilanterol  1 puff Inhalation Daily   Continuous Infusions:    LOS: 3 days    Time spent: 25 minutes   Dessa Phi, DO [facility] [email or web address] 01/01/2019, 10:18 AM

## 2019-01-02 ENCOUNTER — Telehealth: Payer: Self-pay | Admitting: Internal Medicine

## 2019-01-02 LAB — MULTIPLE MYELOMA PANEL, SERUM
Albumin SerPl Elph-Mcnc: 2.3 g/dL — ABNORMAL LOW (ref 2.9–4.4)
Albumin/Glob SerPl: 1.1 (ref 0.7–1.7)
Alpha 1: 0.2 g/dL (ref 0.0–0.4)
Alpha2 Glob SerPl Elph-Mcnc: 0.6 g/dL (ref 0.4–1.0)
B-Globulin SerPl Elph-Mcnc: 0.5 g/dL — ABNORMAL LOW (ref 0.7–1.3)
Gamma Glob SerPl Elph-Mcnc: 0.7 g/dL (ref 0.4–1.8)
Globulin, Total: 2.1 g/dL — ABNORMAL LOW (ref 2.2–3.9)
IgA: 212 mg/dL (ref 87–352)
IgG (Immunoglobin G), Serum: 764 mg/dL (ref 586–1602)
IgM (Immunoglobulin M), Srm: 23 mg/dL — ABNORMAL LOW (ref 26–217)
Total Protein ELP: 4.4 g/dL — ABNORMAL LOW (ref 6.0–8.5)

## 2019-01-02 LAB — BASIC METABOLIC PANEL
Anion gap: 7 (ref 5–15)
BUN: 11 mg/dL (ref 8–23)
CO2: 22 mmol/L (ref 22–32)
Calcium: 6.9 mg/dL — ABNORMAL LOW (ref 8.9–10.3)
Chloride: 95 mmol/L — ABNORMAL LOW (ref 98–111)
Creatinine, Ser: 0.34 mg/dL — ABNORMAL LOW (ref 0.44–1.00)
GFR calc Af Amer: >60 mL/min (ref >60)
GFR calc non Af Amer: >60 mL/min (ref >60)
Glucose, Bld: 91 mg/dL (ref 70–99)
Potassium: 5.1 mmol/L (ref 3.5–5.1)
Sodium: 124 mmol/L — ABNORMAL LOW (ref 135–145)

## 2019-01-02 LAB — HEMOGLOBIN AND HEMATOCRIT, BLOOD
HCT: 35.5 % — ABNORMAL LOW (ref 36.0–46.0)
Hemoglobin: 12.7 g/dL (ref 12.0–15.0)

## 2019-01-02 LAB — HEPATIC FUNCTION PANEL

## 2019-01-02 LAB — CBC

## 2019-01-02 LAB — PH, BODY FLUID: pH, Body Fluid: 7.6

## 2019-01-02 MED ORDER — BOOST / RESOURCE BREEZE PO LIQD CUSTOM
1.0000 | Freq: Two times a day (BID) | ORAL | Status: DC
  Administered 2019-01-02: 1 via ORAL

## 2019-01-02 MED ORDER — PRO-STAT SUGAR FREE PO LIQD: 30.0000 mL | Freq: Two times a day (BID) | ORAL | Status: DC

## 2019-01-02 NOTE — Telephone Encounter (Signed)
Looks like I could see her in the PM on 6/10 230 or 3? If we could do that (scoping but no cases)  OR could do 1130 on 6/11 - in person  Let me know if these not possible

## 2019-01-02 NOTE — Consult Note (Signed)
[place] Nurse wound consult note Reason for Consult: weeping  Much improved, patient to be DC to home. Provided her with suggestions for managing weeping with maxipads and tube socks (cutting the foot out). Easy and affordable way to avoid lots of dressings  Wound type: fluid overload with weeping. Upper and Lower extremities Pressure Injury POA: NA She report also having some reusable underpads at home she uses for her arms.   Discussed POC with patient and bedside nurse.  Re consult if needed, will not follow at this time. Thanks  Javian Nudd [employer], RN,CWOCN, CNS, [place] [phone])

## 2019-01-02 NOTE — Discharge Instructions (Signed)
Low-Sodium Nutrition Therapy Eating less sodium can help you if you have high blood pressure, heart failure, or kidney or liver disease. Your body needs a little sodium, but too much sodium can cause your body to hold onto extra water.  This extra water will raise your blood pressure and can cause damage to your heart, kidneys, or liver as they are forced to work harder. Sometimes you can see how the extra fluid affects you because your hands, legs, or belly swell.  You may also hold water around your heart and lungs, which makes it hard to breathe. Even if you take medication for blood pressure or a water pill (diuretic) to remove fluid, it is still important to have less salt in your diet. Check with your primary care provider before drinking alcohol since it may affect the amount of fluid in your body and how your heart, kidneys, or liver work.  Sodium in Food A low-sodium meal plan limits the sodium that you get from food and beverages to 1,500-2,000 milligrams (mg) per day.  Salt is the main source of sodium. Read the nutrition label on the package to find out how much sodium is in one serving of a food. Select foods with 140 milligrams (mg) of sodium or less per serving. You may be able to eat one or two servings of foods with a little more than 140 milligrams (mg) of sodium if you are closely watching how much sodium you eat in a day. Check the serving size on the label.  The amount of sodium listed on the label shows the amount in one serving of the food.  So, if you eat more than one serving, you will get more sodium than the amount listed.  Cutting Back on Sodium Eat more fresh foods. Fresh fruits and vegetables are low in sodium, as well as frozen vegetables and fruits that have no added juices or sauces. Fresh meats are lower in sodium than processed meats, such as bacon, sausage, and hotdogs. Not all processed foods are unhealthy, but some processed foods may have too much  sodium. Eat less salt at the table and when cooking. One of the ingredients in salt is sodium. One teaspoon of table salt has 2,300 milligrams of sodium. Leave the salt out of recipes for pasta, casseroles, and soups. Be a Paramedic. Food packages that say Salt-free, sodium-free, very low sodium, and low sodium have less than 140 milligrams of sodium per serving. Beware of products identified as Unsalted, No Salt Added, Reduced Sodium, or Lower Sodium.  These items may still be high in sodium. You should always check the nutrition label. Add flavors to your food without adding sodium. Try lemon juice, lime juice, or vinegar. Dry or fresh herbs add flavor. Buy a sodium-free seasoning blend or make your own at home. You can purchase salt-free or sodium-free condiments like barbeque sauce in stores and online.   Eating in Restaurants Choose foods carefully when you eat outside your home. Restaurant foods can be very high in sodium.  Many restaurants provide nutrition facts on their menus or their websites.  If you cannot find that information, ask your server.  Let your server know that you want your food to be cooked without salt and that you would like your salad dressing and sauces to be served on the side.  Recommended Foods Grains Bread, bagels, rolls without salted tops Homemade bread made with reduced-sodium baking powder Cold cereals, especially shredded wheat and puffed rice Oats,  grits, or cream of wheat Pastas, quinoa, and rice Popcorn, pretzels or crackers without salt Corn tortillas  Protein Foods Fresh meats and fish; [place] bacon (check the nutrition labels - make sure they are not packaged in a sodium solution) Canned or packed tuna (no more than 4 ounces at 1 serving) Beans and peas Soybeans) and tofu Eggs Nuts or nut butters without salt  Dairy Milk or milk powder Plant milks, such as rice and soy Yogurt, including Greek yogurt Small  amounts of natural cheese (blocks of cheese) or reduced-sodium cheese can be used in moderation. (Swiss, ricotta, and fresh mozzarella cheese are lower in sodium than the others) Cream cheese Low sodium cottage cheese  Vegetables Fresh and frozen vegetables without added sauces or salt Homemade soups (without salt) Low-sodium, salt-free or sodium-free canned vegetables and soups  Fruit Fresh and canned fruits Dried fruits, such as raisins, cranberries, and prunes  Oils Tub or liquid margarine, regular or without salt Canola, corn, peanut, olive, safflower, or sunflower oils  Condiments Fresh or dried herbs such as basil, bay leaf, dill, mustard (dry), nutmeg, paprika, parsley, rosemary, sage, or thyme. Low sodium ketchup Vinegar Lemon or lime juice Pepper, red pepper flakes, and cayenne. Hot sauce contains sodium, but if you use just a drop or two, it will not add up to much. Salt-free or sodium-free seasoning mixes and marinades Simple salad dressings: vinegar and oil  Foods Not Recommended Grains Breads or crackers topped with salt Cereals (hot/cold) with more than 300 mg sodium per serving Biscuits, cornbread, and other quick breads prepared with baking soda Pre-packaged bread crumbs Seasoned and packaged rice and pasta mixes Self-rising flours  Protein Foods Cured meats: Bacon, ham, sausage, pepperoni and hot dogs Canned meats (chili, vienna sausage, or sardines) Smoked fish and meats Frozen meals that have more than 600 mg of sodium per serving Egg substitute (with added sodium)  Dairy Buttermilk Processed cheese spreads Cottage cheese (1 cup may have over 500 mg of sodium; look for low-sodium.) American or feta cheese Shredded Cheese has more sodium than blocks of cheese String cheese  Vegetables Canned vegetables (unless they are salt-free, sodium-free or low sodium) Frozen vegetables with seasoning and sauces Sauerkraut and pickled vegetables Canned  or dried soups (unless they are salt-free, sodium-free, or low sodium) [place] fries and onion rings  Fruit  Dried fruits preserved with additives that have sodium  Oils  Salted butter or margarine, all types of olives  Condiments Salt, sea salt, kosher salt, onion salt, and garlic salt Seasoning mixes with salt Bouillon cubes Ketchup Barbeque sauce and Worcestershire sauce unless low sodium Soy sauce Salsa, pickles, olives, relish Salad dressings: ranch, blue cheese, [place], and [place].  Low Sodium Sample 1-Day Menu Breakfast 1 cup cooked oatmeal 1 slice whole wheat bread toast 1 tablespoon peanut butter without salt 1 banana 1 cup 1% milk Lunch Tacos made with: 2 corn tortillas  cup black beans, low sodium  cup roasted or grilled chicken (without skin)  avocado Squeeze of lime juice 1 cup salad greens 1 tablespoon low-sodium salad dressing  cup strawberries 1 orange Afternoon Snack 1/3 cup grapes 6 ounces yogurt Evening Meal 3 ounces herb-baked fish 1 baked potato 2 teaspoons olive oil  cup cooked carrots 2 thick slices tomatoes on: 2 lettuce leaves 1 teaspoon olive oil 1 teaspoon balsamic vinegar 1 cup 1% milk Evening Snack 1 apple  cup almonds without salt   Sodium-Free Flavoring Tips When cooking, the following items may be used  for flavoring instead of salt or seasonings that contain sodium. Remember: A little bit of spice goes a long way! Be careful not to overseason.  Spice Blend Recipe (makes about 1/2 cup) 5 teaspoons onion powder 2 teaspoons garlic powder 2 teaspoons paprika 2 teaspoon dry mustard 1 teaspoon crushed thyme leaves  teaspoon white pepper  teaspoon celery seed  Food Item Flavorings Beef  Basil, bay leaf, caraway, curry, dill, dry mustard, garlic, grape jelly, green pepper, mace, marjoram, mushrooms (fresh), nutmeg, onion or onion powder, parsley, pepper, rosemary, sage Chicken  Basil, cloves, cranberries,  mace, mushrooms (fresh), nutmeg, oregano, paprika, parsley, pineapple, saffron, sage, savory, tarragon, thyme, tomato, turmeric Egg  Chervil, curry, dill, dry mustard, garlic or garlic powder, green pepper, jelly, mushrooms (fresh), nutmeg, onion powder, paprika, parsley, rosemary, tarragon, tomato Fish  Basil, bay leaf, chervil, curry, dill, dry mustard, green pepper, lemon juice, marjoram, mushrooms (fresh), paprika, pepper, tarragon, tomato, turmeric Lamb  Cloves, curry, dill, garlic or garlic powder, mace, mint, mint jelly, onion, oregano, parsley, pineapple, rosemary, tarragon, thyme Pork  Applesauce, basil, caraway, chives, cloves, garlic or garlic powder, onion or onion powder, rosemary, thyme Veal  Apricots, basil, bay leaf, currant jelly, curry, ginger, marjoram, mushrooms (fresh), oregano, paprika Vegetables  Basil, dill, garlic or garlic powder, ginger, lemon juice, mace, marjoram, nutmeg, onion or onion powder, tarragon, tomato, sugar or sugar substitute, salt-free salad dressing, vinegar Desserts  Allspice, anise, cinnamon, cloves, ginger, mace, nutmeg, vanilla extract, other extracts   Sodium (Salt) Content of Foods Eating more than the serving size for a moderate or low-sodium food will make it a high-sodium food.  oods made with high-sodium foods will also be high in sodium. Unless otherwise noted, all foods are cooked: meat is roasted, fish is cooked with dry heat, and vegetables are cooked from fresh and fruit is raw. This is a guide. Actual values may vary depending on product and/or processing.  Canned and processed foods may have a higher sodium content. Values are rounded to the nearest 5-milligram (mg) increment and may be averaged with similar foods in the group.  High Sodium (more than 300 mg) Food Serving Milligrams (mg) Bacon 2 slices 099 Bagel, 4?: egg 1 each 450 Bagel, 4?: plain, onion, or seeded 1 each 400 Barbecue sauce 2 Tbsp 350 Beans, baked,  plain  cup 435 Beans, garbanzo  cup 360 Beans, kidney, canned  cup 440 Beans, lima, canned  cup 405 Beans, white, canned  cup 445 Beef, dried 1 oz. 790 Biscuit, 2? 1 each 350 Catsup 2 Tbsp 335 Cheese, American 1 oz 400 Cheese, cottage  cup 460 Cheese, feta 1 oz 315 Corn, creamed, canned  cup 365 Croissant 2 oz 425 Fish, salmon, canned 3 oz 470 Fish, salmon, smoked 3 oz 670 Fish, sardines, canned 3 oz 430 Frankfurter, beef or pork 1 each 510 Ham 3 oz 1,125 Lobster 3 oz 325 Miso  cup 1,280 Mushrooms, canned  cup 330 Pickle, dill 1 large 570 Potatoes, au gratin or scalloped  cup 500 Pretzels 1 oz 400 Pudding, instant, chocolate, prepared with milk  cup 420 Salad dressing, [place], commercial 2 Tbsp 485 Salami, dry or hard 1 oz 600 Salt, table 1 tsp 2,325 Sauerkraut, canned  cup 780 Soup, canned 1 cup 700-1,000 Soy sauce 1 Tbsp 900 Spinach, canned, drained  cup 345 Teriyaki sauce 1 Tbsp 690 Tomato or vegetable juice, canned  cup 325 Tomato sauce, canned  cup 640 Tomato sauce, spaghetti or marinara  cup 510 Vegetable or  soy patty 1 each 380  Moderate Sodium (140-300 mg) Food Serving Milligrams (mg) Asparagus, canned 4 spears 205 Beans, green or yellow, canned  cup 175 Beets, canned  cup 160 Bologna, pork and beef 1 oz 210 Bread, pita, 4? 1 each 150 Bread, pumpernickel or rye 1 slice 623 Bread, white 1 slice 762 Carrots, canned  cup 175 Cereal, raisin bran  cup 175 Cheese: [place], mozzarella, cheddar 1 oz 175 Cheese, Parmesan 2 Tbsp 150 Cheese, provolone, part-skim 1 oz 250 Cheese, ricotta  cup 155 Corn, canned  cup 285 Crab, canned 3 oz 240 English muffin 1 each 250 French fries 10 fries 200 Greens, beet  cup 175 Milk, buttermilk 1 cup 260 Milk, chocolate 1 cup 165 Milkshake 8 oz 240 Muffin 2 oz 250 Nuts, mixed, salted 1 oz 190 Olives, ripe, canned 5 large 190 Pancake or waffle, 4? 1 each 240 Peanuts, salted 1 oz 230 Peas, green,  canned  cup 215 Potato chips 1 oz 190 Potatoes, mashed, prepared from dry mix  cup 170 Pudding, ready-to-eat  cup 160 Pudding, vanilla, from mix  cup 225 Roll, hot dog or hamburger 1 each 205 Salad dressing 2 Tbsp 200-300 Salsa 2 Tbsp 195 Sausage, pork 1 oz 200 Tomatoes, canned  cup 170 Tomatoes, stewed, canned  cup 280 Tortilla, flour, 6? 1 each 205 Tuna, canned in water 3 oz 290 Yogurt, plain or fruited 8 oz 100-175  Low Sodium (less than 140 mg) Food Serving Milligrams (mg) Bread, Italian 1 slice 831 Bread, wheat 1 slice 517 Butter, salted 1 Tbsp 90 Cereal, breakfast: corn, bran, or wheat  cup 100-150 Cheese, Swiss 1 oz 55 Egg substitute, liquid  cup 120 Egg, whole 1 large 70 Fish: pollock, swordfish, perch, cod, halibut, roughy, salmon 3 oz 60-100 Frozen yogurt  cup 65 Gelatin, prepared from mix  cup 100 Ice cream  cup 55 Margarine, regular 1 Tbsp 135 Milk, all types 1 cup 100 Milk, evaporated, canned  cup 135 Mustard 1 tsp 55 Peanut butter 1 Tbsp 75 Peas, green, frozen  cup 60 Seeds, sunflower 1 oz 115 Soy milk 1 cup 125 Spinach  cup 65 Spinach, frozen  cup 90 Sweet potato, baked in skin 1 medium 40 [place], light or dark meat 3 oz 60 Yogurt, plain or fruited 8 oz 100-175  Very Low Sodium (less than 35 mg) Food Serving Milligrams (mg) Apricots, canned  cup 5 Beef, ground 1 oz. 20 Beer, regular 12 oz 15 Broccoli  cup 30 Broccoli, raw  cup 15 Brussels sprouts  cup 15 Cabbage, raw or cooked  cup 5 Carbonated beverages 12 oz 20-40 Cauliflower  cup 10 Cauliflower, raw  cup 15 Dried beans and peas  cup 5-20 Greens: beet, collard, mustard  cup 10-20 Honeydew  cup 30 Lettuce, leaf 1 cup 15 Noodles  cup 10 Oatmeal  cup 5 Peaches, canned  cup 5 Pears, canned  cup 5 Pork 1 oz 25 Potato, baked with skin 1 medium 20 Rice, brown or wild  cup 5 Sherbet  cup 35 Soybeans  cup 15 Spinach, raw 1 cup 25 Tofu, firm  cup 10 Wine,  table, all types 5 oz 10 Sodium Free (less than 5 mg) Food Serving Avocado 1 oz Beans: navy, black, pinto  cup Nuts: almonds, pecans, or walnuts, unsalted 1 oz Oil, all types 1 Tbsp Popcorn, air popped 1 cup Raisins, seedless  cup Rice, white  cup Tomato, raw 1 medium Fruit and juices not previously listed 1 piece or  cup Vegetables not previously listed  cup

## 2019-01-02 NOTE — Discharge Summary (Signed)
Physician Discharge Summary  DEAUNNA OLARTE XTK:[id number] DOB: 10-Jan-1946 DOA: 12/29/2018  PCP: Vicenta Aly, FNP  Admit date: 12/29/2018 Discharge date: 01/02/2019  Admitted From: Home Disposition:  Home   Recommendations for Outpatient Follow-up:  1. Follow up with Dr. Carlean Purl, GI, in the next week 2. Follow-up autoimmune serology ordered by GI 3. Recommend MRI liver with contrast on a nonemergent basis, per GI  4. Diuretics on hold at time of discharge, follow-up with Dr. Celesta Aver team for resumption and repeat BMP 5. Discussed alcohol cessation  Home Health: PT OT RN   Equipment/Devices: None    Discharge Condition: Stable CODE STATUS: DNR   Diet recommendation: Low-sodium diet  Brief/Interim Summary: Cassidy Barrett a 73 y.o.femalewith medical history significant ofmultiple myeloma, cirrhosis, vertebral compression fractures, emphysema, alcoholdependence who presented to the hospital from the cancer center for evaluation of abnormal labs.Patient states she went to see Dr. Mindi Junker the cancer center for a follow-up visit for her multiple myeloma and was told she had abnormal labs and advised to come into the hospital.  She was found to have hyponatremia, as well as hypokalemia.  Her sodium levels have remained stable, although low.  Potassium levels were replaced.  Patient underwent right upper quadrant ultrasound which revealed moderate ascites and she underwent paracentesis without evidence of SBP.  GI was consulted during hospitalization.  Discharge Diagnoses:   Alcoholic liver cirrhosis with ascites -Followed by Dr. Carlean Purl. [facility] consulted -Last alcoholic beverage 6/3. CIWA protocol, but does not appear to be withdrawing at this time -Patient has elevated bilirubin levels with chronic elevated LFT -Previous US in 2019 revealed gallstones with sludge -RUQ [place] 6/5 revealed hepatic cirrhosis, moderate ascites, cholelithiasis without evidence of inflammation -S/p  paracentesis with 2.5L fluid removal, negative for SBP. Antibiotics stopped. Sepsis ruled out  -Lasix, spironolactone on hold  Acute on chronic hypoosmolar hyponatremia -Patient with chronic sodium level 129-132 -Lasix, spironolactone on hold, BMP remains low but stable  Protein calorie malnutrition -Appreciate nutrition consult  Chronic diastolic heart failure -I do not believe her fluid overload status is secondary to acute exacerbation of her heart failure, BNP 100.  Likely secondary to severe protein malnutrition as well as cirrhosis. Stable overall.   Chronic macrocytic anemia -Continue folic acid  Multiple myeloma -Followed by Dr. Irene Limbo   Chronic pain with neuropathy -Lidocaine patch and neurontin    Discharge Instructions  Discharge Instructions    Call MD for:  difficulty breathing, headache or visual disturbances   Complete by:  As directed    Call MD for:  extreme fatigue   Complete by:  As directed    Call MD for:  hives   Complete by:  As directed    Call MD for:  persistant dizziness or light-headedness   Complete by:  As directed    Call MD for:  persistant nausea and vomiting   Complete by:  As directed    Call MD for:  severe uncontrolled pain   Complete by:  As directed    Call MD for:  temperature >100.4   Complete by:  As directed    Diet - low sodium heart healthy   Complete by:  As directed    Discharge instructions   Complete by:  As directed    You were cared for by a hospitalist during your hospital stay. If you have any questions about your discharge medications or the care you received while you were in the hospital after you are discharged, you  can call the unit and ask to speak with the hospitalist on call if the hospitalist that took care of you is not available. Once you are discharged, your primary care physician will handle any further medical issues. Please note that NO REFILLS for any discharge medications will be authorized once you  are discharged, as it is imperative that you return to your primary care physician (or establish a relationship with a primary care physician if you do not have one) for your aftercare needs so that they can reassess your need for medications and monitor your lab values.   Discharge wound care:   Complete by:  As directed    Keep areas of weeping skin clean and dry. Gentle wash and dry daily.   Increase activity slowly   Complete by:  As directed      Allergies as of 01/02/2019      Reactions   Bee Venom Swelling   Benadryl [diphenhydramine]    "climbs the walls"   Hydrocodone-acetaminophen    "flips me out"   Other    Cancer drug that caused neuropathy   Oxycodone Other (See Comments)   Anxiety, restless   Tape    Skin sensitive to tape   Tramadol Diarrhea, Other (See Comments)   All listed side effects, except seizures   Valium [diazepam] Other (See Comments)   Pt states makes her crazy      Medication List    STOP taking these medications   furosemide 20 MG tablet Commonly known as:  LASIX   potassium chloride SA 20 MEQ tablet Commonly known as:  K-DUR   spironolactone 50 MG tablet Commonly known as:  ALDACTONE     TAKE these medications   acyclovir 400 MG tablet Commonly known as:  ZOVIRAX TAKE 1 TABLET(400 MG) BY MOUTH TWICE DAILY What changed:  See the new instructions.   albuterol 108 (90 Base) MCG/ACT inhaler Commonly known as:  VENTOLIN HFA INHALE 2 PUFFS INTO THE LUNGS EVERY 6 HOURS AS NEEDED FOR WHEEZING OR SHORTNESS OF BREATH What changed:    how much to take  how to take this  when to take this  reasons to take this  additional instructions   Anoro Ellipta 62.5-25 MCG/INH Aepb Generic drug:  umeclidinium-vilanterol INHALE 1 PUFF INTO THE LUNGS DAILY What changed:  See the new instructions.   diclofenac sodium 1 % Gel Commonly known as:  VOLTAREN Apply 1 g topically 3 (three) times daily.   ergocalciferol 1.25 MG (50000 UT)  capsule Commonly known as:  VITAMIN D2 Take 1 capsule (50,000 Units total) by mouth once a week.   gabapentin 300 MG capsule Commonly known as:  NEURONTIN Take 1 capsule by mouth 3 (three) times daily.   Lidocaine 4 % Ptch Place 1 patch onto the skin daily as needed (severe pain). Remove & Discard patch within 12 hours or as directed by MD   ondansetron 8 MG tablet Commonly known as:  Zofran Take 1 tablet (8 mg total) by mouth 2 (two) times daily as needed (Nausea or vomiting).   OVER THE COUNTER MEDICATION Apply 400 mg topically daily as needed (arthritis/back pain). CBD cream   OVER THE COUNTER MEDICATION MAGNESIUM SPRAY '12MG'$ --SPRAY ON FEET AS NEEDED FOR NEUROPATHY            Discharge Care Instructions  (From admission, onward)         Start     Ordered   01/02/19 0000  Discharge wound care:  Comments:  Keep areas of weeping skin clean and dry. Gentle wash and dry daily.   01/02/19 1059         Follow-up Information    Gatha Mayer, MD. Schedule an appointment as soon as possible for a visit in 1 week(s).   Specialty:  Gastroenterology Contact information: [address] [address] [phone]          Allergies  Allergen Reactions  . Bee Venom Swelling  . Benadryl [Diphenhydramine]     "climbs the walls"  . Hydrocodone-Acetaminophen     "flips me out"  . 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 Oxycodone Other (See Comments)    Anxiety, restless  . Tape     Skin sensitive to tape  . Tramadol Diarrhea and Other (See Comments)    All listed side effects, except seizures  . Valium [Diazepam] Other (See Comments)    Pt states makes her crazy    Consultations:  GI   Procedures/Studies: US Paracentesis  Result Date: 12/31/2018 INDICATION: 73 year old with ascites.  Multiple myeloma.  Cirrhosis. EXAM: ULTRASOUND GUIDED PARACENTESIS MEDICATIONS: None. COMPLICATIONS: None immediate. PROCEDURE: Informed written consent  was obtained from the patient after a discussion of the risks, benefits and alternatives to treatment. A timeout was performed prior to the initiation of the procedure. Initial ultrasound scanning demonstrates a large amount of ascites within the left lower abdominal quadrant. The left lower abdomen was prepped and draped in the usual sterile fashion. 1% lidocaine with was used for local anesthesia. Following this, a 6 Fr Safe-T-Centesis catheter was introduced. An ultrasound image was saved for documentation purposes. The paracentesis was performed. The catheter was removed and a dressing was applied. The patient tolerated the procedure well without immediate post procedural complication. FINDINGS: A total of approximately 2.5 L of yellow fluid was removed. Samples were sent to the laboratory as requested by the clinical team. IMPRESSION: Successful ultrasound-guided paracentesis yielding 2.5 liters of peritoneal fluid. Electronically Signed   By: Markus Daft M.D.   On: 12/31/2018 14:24   Dg Chest Port 1 View  Result Date: 12/29/2018 CLINICAL DATA:  Multiple abnormal labs. Multiple myeloma. Bilateral leg and arm pain. EXAM: PORTABLE CHEST 1 VIEW COMPARISON:  PET-CT dated 03/24/2018. FINDINGS: Normal sized heart. Small to moderate-sized bilateral pleural effusions and ill-defined increased density at both lung bases. Diffuse osteopenia. Lower thoracic vertebral kyphoplasty material. IMPRESSION: Small to moderate-sized bilateral pleural effusions and bibasilar atelectasis. Electronically Signed   By: Claudie Revering M.D.   On: 12/29/2018 17:38   US Abdomen Limited Ruq  Result Date: 12/30/2018 CLINICAL DATA:  Elevated bilirubin level. EXAM: ULTRASOUND ABDOMEN LIMITED RIGHT UPPER QUADRANT COMPARISON:  Ultrasound of February 11, 2018. FINDINGS: Gallbladder: Cholelithiasis is noted with the largest stone measuring 6 mm. No sonographic Murphy's sign is noted. Mild gallbladder wall thickening is noted which most likely is  due to adjacent hepatocellular disease or ascites. Common bile duct: Diameter: 5 mm which is within normal limits. Liver: Increased echogenicity of hepatic parenchyma is noted with nodular hepatic contours consistent with hepatic cirrhosis. No definite focal sonographic abnormality is noted. Moderate ascites is noted. Portal vein is patent on color Doppler imaging with normal direction of blood flow towards the liver. IMPRESSION: Findings consistent with hepatic cirrhosis. No definite focal sonographic abnormality is noted. Moderate ascites is noted. Cholelithiasis is noted without definite evidence of inflammation. Electronically Signed   By: Bobbe Medico.D.  On: 12/30/2018 15:04      Discharge Exam: Vitals:   01/02/19 0822 01/02/19 0823  BP:    Pulse:    Resp:    Temp:    SpO2: 99% 99%    General: Pt is alert, awake, not in acute distress Cardiovascular: RRR, S1/S2 +, no rubs, no gallops Respiratory: CTA bilaterally, no wheezing, no rhonchi Abdominal: Soft, NT, ND, bowel sounds + Extremities: no edema, no cyanosis    The results of significant diagnostics from this hospitalization (including imaging, microbiology, ancillary and laboratory) are listed below for reference.     Microbiology: Recent Results (from the past 240 hour(s))  SARS Coronavirus 2 (CEPHEID - Performed in [place] hospital lab), Hosp Order     Status: None   Collection Time: 12/29/18  4:22 PM  Result Value Ref Range Status   SARS Coronavirus 2 NEGATIVE NEGATIVE Final    Comment: (NOTE) If result is NEGATIVE SARS-CoV-2 target nucleic acids are NOT DETECTED. The SARS-CoV-2 RNA is generally detectable in upper and lower  respiratory specimens during the acute phase of infection. The lowest  concentration of SARS-CoV-2 viral copies this assay can detect is 250  copies / mL. A negative result does not preclude SARS-CoV-2 infection  and should not be used as the sole basis for treatment or other  patient  management decisions.  A negative result may occur with  improper specimen collection / handling, submission of specimen other  than nasopharyngeal swab, presence of viral mutation(s) within the  areas targeted by this assay, and inadequate number of viral copies  (<250 copies / mL). A negative result must be combined with clinical  observations, patient history, and epidemiological information. If result is POSITIVE SARS-CoV-2 target nucleic acids are DETECTED. The SARS-CoV-2 RNA is generally detectable in upper and lower  respiratory specimens dur ing the acute phase of infection.  Positive  results are indicative of active infection with SARS-CoV-2.  Clinical  correlation with patient history and other diagnostic information is  necessary to determine patient infection status.  Positive results do  not rule out bacterial infection or co-infection with other viruses. If result is PRESUMPTIVE POSTIVE SARS-CoV-2 nucleic acids MAY BE PRESENT.   A presumptive positive result was obtained on the submitted specimen  and confirmed on repeat testing.  While 2019 novel coronavirus  (SARS-CoV-2) nucleic acids may be present in the submitted sample  additional confirmatory testing may be necessary for epidemiological  and / or clinical management purposes  to differentiate between  SARS-CoV-2 and other Sarbecovirus currently known to infect humans.  If clinically indicated additional testing with an alternate test  methodology [phone]) is advised. The SARS-CoV-2 RNA is generally  detectable in upper and lower respiratory sp ecimens during the acute  phase of infection. The expected result is Negative. Fact Sheet for Patients:  StrictlyIdeas.no Fact Sheet for Healthcare Providers: BankingDealers.co.za This test is not yet approved or cleared by the [place] FDA and has been authorized for detection and/or diagnosis of SARS-CoV-2 by FDA under  an Emergency Use Authorization (EUA).  This EUA will remain in effect (meaning this test can be used) for the duration of the COVID-19 declaration under Section 564(b)(1) of the Act, 21 U.S.C. section 360bbb-3(b)(1), unless the authorization is terminated or revoked sooner. Performed at [facility], [place] [address], [place], [place] [address]   Culture, Urine     Status: None   Collection Time: 12/30/18  3:36 AM  Result Value Ref Range  Status   Specimen Description   Final    URINE, CATHETERIZED Performed at [facility], [place] [address], [place], [place] [address]    Special Requests   Final    NONE Performed at [facility], [place] [address] [place], [place] [address]    Culture   Final    NO GROWTH Performed at [facility], [place] [address], [place], [place] [address]    Report Status 12/31/2018 FINAL  Final  Culture, blood (routine x 2)     Status: None (Preliminary result)   Collection Time: 12/30/18  5:54 AM  Result Value Ref Range Status   Specimen Description   Final    BLOOD LEFT ARM Performed at [facility], [place] [address] [place], [place] [address]    Special Requests   Final    BOTTLES DRAWN AEROBIC ONLY Blood Culture adequate volume Performed at [place] [address] [place], [place] [address]    Culture   Final    NO GROWTH 3 DAYS Performed at [facility], [place] [address] [place], [place] [address]    Report Status PENDING  Incomplete  Culture, blood (routine x 2)     Status: None (Preliminary result)   Collection Time: 12/30/18  9:27 AM  Result Value Ref Range Status   Specimen Description   Final    BLOOD LEFT ARM Performed at [facility], [place] [address] [place], [place] [address]    Special Requests   Final    BOTTLES DRAWN AEROBIC ONLY Blood Culture results may not be optimal due to an inadequate volume of blood received in culture  bottles Performed at [place] [address], [place], [place] [address]    Culture   Final    NO GROWTH 3 DAYS Performed at [facility], [place] [address], [place], [place] [address]    Report Status PENDING  Incomplete  Gram stain     Status: None   Collection Time: 12/31/18  2:00 PM  Result Value Ref Range Status   Specimen Description FLUID  Final   Special Requests NONE  Final   Gram Stain   Final    NO WBC SEEN NO ORGANISMS SEEN Performed at [facility], [place] [address], [place], [place] [address]    Report Status 12/31/2018 FINAL  Final  Culture, body fluid-bottle     Status: None (Preliminary result)   Collection Time: 12/31/18  2:00 PM  Result Value Ref Range Status   Specimen Description FLUID  Final   Special Requests NONE  Final   Culture   Final    NO GROWTH 2 DAYS Performed at [place] [address], [place], [place] [address]    Report Status PENDING  Incomplete     Labs: BNP (last 3 results) Recent Labs    12/29/18 2256  BNP 188.4*   Basic Metabolic Panel: Recent Labs  Lab 12/29/18 1818  12/30/18 0554 12/31/18 0439 01/01/19 0812 01/02/19 0351 01/02/19 0839  NA  --    < > 127* 125* 126* QUESTIONABLE RESULTS, RECOMMEND RECOLLECT TO VERIFY 124*  K  --    < > 3.7 3.6 3.0* QUESTIONABLE RESULTS, RECOMMEND RECOLLECT TO VERIFY 5.1  CL  --    < > 94* 96* 94* QUESTIONABLE RESULTS, RECOMMEND RECOLLECT TO VERIFY 95*  CO2  --    < > 25 24  24 QUESTIONABLE RESULTS, RECOMMEND RECOLLECT TO VERIFY 22  GLUCOSE  --    < > 119* 78 83 QUESTIONABLE RESULTS, RECOMMEND RECOLLECT TO VERIFY 91  BUN  --    < > 5* 8 11 QUESTIONABLE RESULTS, RECOMMEND RECOLLECT TO VERIFY 11  CREATININE  --    < > <0.30* <0.30* 0.31* QUESTIONABLE RESULTS, RECOMMEND RECOLLECT TO VERIFY 0.34*  CALCIUM  --    < > 6.5* 6.0* 6.4* QUESTIONABLE RESULTS, RECOMMEND RECOLLECT TO VERIFY 6.9*  MG 1.7  --   --   --   --   --   --    < > = values in this  interval not displayed.   Liver Function Tests: Recent Labs  Lab 12/29/18 1309 12/30/18 0554 12/31/18 0439 01/01/19 0812 01/02/19 0351  AST 32 31 32 33 QUESTIONABLE RESULTS, RECOMMEND RECOLLECT TO VERIFY  ALT '15 16 14 15 '$ QUESTIONABLE RESULTS, RECOMMEND RECOLLECT TO VERIFY  ALKPHOS 457* 310* 281* 304* QUESTIONABLE RESULTS, RECOMMEND RECOLLECT TO VERIFY  BILITOT 3.1* 4.1* 2.5* 2.1* QUESTIONABLE RESULTS, RECOMMEND RECOLLECT TO VERIFY  PROT 4.5* 4.0* 3.5* 3.9* QUESTIONABLE RESULTS, RECOMMEND RECOLLECT TO VERIFY  ALBUMIN 2.2* 1.9* 1.6* 1.8* QUESTIONABLE RESULTS, RECOMMEND RECOLLECT TO VERIFY   No results for input(s): LIPASE, AMYLASE in the last 168 hours. No results for input(s): AMMONIA in the last 168 hours. CBC: Recent Labs  Lab 12/29/18 1309 12/30/18 0927 12/31/18 0439 01/01/19 0740 01/02/19 0351 01/02/19 0839  WBC 12.9* 13.9* 8.6 8.7 QUESTIONABLE RESULTS, RECOMMEND RECOLLECT TO VERIFY  --   NEUTROABS 10.8*  --   --   --   --   --   HGB 13.1 11.7* 10.4* 11.9* QUESTIONABLE RESULTS, RECOMMEND RECOLLECT TO VERIFY 12.7  HCT 35.1* 32.8* 30.7* 33.8* QUESTIONABLE RESULTS, RECOMMEND RECOLLECT TO VERIFY 35.5*  MCV 95.4 102.5* 105.1* 99.7 QUESTIONABLE RESULTS, RECOMMEND RECOLLECT TO VERIFY  --   PLT 275 208 177 217 QUESTIONABLE RESULTS, RECOMMEND RECOLLECT TO VERIFY  --    Cardiac Enzymes: Recent Labs  Lab 12/29/18 1818  TROPONINI <0.03   BNP: Invalid input(s): POCBNP CBG: No results for input(s): GLUCAP in the last 168 hours. D-Dimer No results for input(s): DDIMER in the last 72 hours. Hgb A1c No results for input(s): HGBA1C in the last 72 hours. Lipid Profile No results for input(s): CHOL, HDL, LDLCALC, TRIG, CHOLHDL, LDLDIRECT in the last 72 hours. Thyroid function studies No results for input(s): TSH, T4TOTAL, T3FREE, THYROIDAB in the last 72 hours.  Invalid input(s): FREET3 Anemia work up No results for input(s): VITAMINB12, FOLATE, FERRITIN, TIBC, IRON, RETICCTPCT  in the last 72 hours. Urinalysis    Component Value Date/Time   COLORURINE AMBER (A) 12/30/2018 0336   APPEARANCEUR CLEAR 12/30/2018 0336   LABSPEC 1.017 12/30/2018 0336   PHURINE 6.0 12/30/2018 0336   GLUCOSEU NEGATIVE 12/30/2018 0336   HGBUR NEGATIVE 12/30/2018 0336   BILIRUBINUR SMALL (A) 12/30/2018 0336   KETONESUR NEGATIVE 12/30/2018 0336   PROTEINUR NEGATIVE 12/30/2018 0336   UROBILINOGEN 1.0 12/11/2014 1552   NITRITE NEGATIVE 12/30/2018 0336   LEUKOCYTESUR NEGATIVE 12/30/2018 0336   Sepsis Labs Invalid input(s): PROCALCITONIN,  WBC,  LACTICIDVEN Microbiology Recent Results (from the past 240 hour(s))  SARS Coronavirus 2 (CEPHEID - Performed in [place] hospital lab), Hosp Order     Status: None   Collection Time: 12/29/18  4:22 PM  Result Value Ref Range Status   SARS Coronavirus 2 NEGATIVE NEGATIVE Final    Comment: (NOTE) If result is NEGATIVE SARS-CoV-2 target nucleic  acids are NOT DETECTED. The SARS-CoV-2 RNA is generally detectable in upper and lower  respiratory specimens during the acute phase of infection. The lowest  concentration of SARS-CoV-2 viral copies this assay can detect is 250  copies / mL. A negative result does not preclude SARS-CoV-2 infection  and should not be used as the sole basis for treatment or other  patient management decisions.  A negative result may occur with  improper specimen collection / handling, submission of specimen other  than nasopharyngeal swab, presence of viral mutation(s) within the  areas targeted by this assay, and inadequate number of viral copies  (<250 copies / mL). A negative result must be combined with clinical  observations, patient history, and epidemiological information. If result is POSITIVE SARS-CoV-2 target nucleic acids are DETECTED. The SARS-CoV-2 RNA is generally detectable in upper and lower  respiratory specimens dur ing the acute phase of infection.  Positive  results are indicative of active  infection with SARS-CoV-2.  Clinical  correlation with patient history and other diagnostic information is  necessary to determine patient infection status.  Positive results do  not rule out bacterial infection or co-infection with other viruses. If result is PRESUMPTIVE POSTIVE SARS-CoV-2 nucleic acids MAY BE PRESENT.   A presumptive positive result was obtained on the submitted specimen  and confirmed on repeat testing.  While 2019 novel coronavirus  (SARS-CoV-2) nucleic acids may be present in the submitted sample  additional confirmatory testing may be necessary for epidemiological  and / or clinical management purposes  to differentiate between  SARS-CoV-2 and other Sarbecovirus currently known to infect humans.  If clinically indicated additional testing with an alternate test  methodology [phone]) is advised. The SARS-CoV-2 RNA is generally  detectable in upper and lower respiratory sp ecimens during the acute  phase of infection. The expected result is Negative. Fact Sheet for Patients:  StrictlyIdeas.no Fact Sheet for Healthcare Providers: BankingDealers.co.za This test is not yet approved or cleared by the [place] FDA and has been authorized for detection and/or diagnosis of SARS-CoV-2 by FDA under an Emergency Use Authorization (EUA).  This EUA will remain in effect (meaning this test can be used) for the duration of the COVID-19 declaration under Section 564(b)(1) of the Act, 21 U.S.C. section 360bbb-3(b)(1), unless the authorization is terminated or revoked sooner. Performed at [facility], [place] [address] [place], [place] [address]   Culture, Urine     Status: None   Collection Time: 12/30/18  3:36 AM  Result Value Ref Range Status   Specimen Description   Final    URINE, CATHETERIZED Performed at [place] [address], [place], [place] [address]    Special Requests    Final    NONE Performed at [facility], [place] [address] [place], [place] [address]    Culture   Final    NO GROWTH Performed at [facility], [place] [address] [place], [place] [address]    Report Status 12/31/2018 FINAL  Final  Culture, blood (routine x 2)     Status: None (Preliminary result)   Collection Time: 12/30/18  5:54 AM  Result Value Ref Range Status   Specimen Description   Final    BLOOD LEFT ARM Performed at [facility], [place] [address], [place], [place] [address]    Special Requests   Final    BOTTLES DRAWN AEROBIC ONLY Blood Culture adequate volume Performed at [facility],  [place] [address], [place], [place] [address]    Culture   Final    NO GROWTH 3 DAYS Performed at [facility], [place] [address] [place], [place] [address]    Report Status PENDING  Incomplete  Culture, blood (routine x 2)     Status: None (Preliminary result)   Collection Time: 12/30/18  9:27 AM  Result Value Ref Range Status   Specimen Description   Final    BLOOD LEFT ARM Performed at [facility], [place] [address], [place], [place] [address]    Special Requests   Final    BOTTLES DRAWN AEROBIC ONLY Blood Culture results may not be optimal due to an inadequate volume of blood received in culture bottles Performed at [facility] [address] [place], [place] [address]    Culture   Final    NO GROWTH 3 DAYS Performed at [facility], [employer] [address] [place], [place] [address]    Report Status PENDING  Incomplete  Gram stain     Status: None   Collection Time: 12/31/18  2:00 PM  Result Value Ref Range Status   Specimen Description FLUID  Final   Special Requests NONE  Final   Gram Stain   Final    NO WBC SEEN NO ORGANISMS SEEN Performed at [facility], [place] [address] [place],  [address]    Report Status 12/31/2018 FINAL  Final  Culture, body fluid-bottle     Status: None  (Preliminary result)   Collection Time: 12/31/18  2:00 PM  Result Value Ref Range Status   Specimen Description FLUID  Final   Special Requests NONE  Final   Culture   Final    NO GROWTH 2 DAYS Performed at [facility] [address], [place],  [address]    Report Status PENDING  Incomplete     Patient was seen and examined on the day of discharge and was found to be in stable condition. Time coordinating discharge: 35 minutes including assessment and coordination of care, as well as examination of the patient.   SIGNED:  Dessa Phi, DO [facility] [email or web address] 01/02/2019, 10:59 AM

## 2019-01-02 NOTE — Progress Notes (Signed)
Patient stated she urinated. RN changed pad which had stool. Unable to tell if urine was present. If it was it was a very small amount. Patient refuses to use even the fracture bedpan to measure urine output. Purewick has not been successful with this patient. Patient is mostly incontinent of bowel and bladder. MD updated.   Maikayla Beggs, Fraser Din 01/02/2019 9:11 AM

## 2019-01-02 NOTE — Telephone Encounter (Signed)
Scheduled for 6/11 with [name] for 11:30

## 2019-01-02 NOTE — Progress Notes (Signed)
NUTRITION NOTE RD working remotely.   Patient seen for full assessment by another RD on 6/5. Consult placed 6/6 AM for call to patient's wife [name]) to determine any nutrition-related questions or concerns for transition home.   Able to contact [name] via phone this AM. Talked with her about continuing a low sodium diet at home and the rationale for this. She asks about need for low sodium diet in light of serum sodium being low; explained this and reason for low serum sodium. She verbalizes understanding. She states that patient has dentures and sometimes, especially when patient is tired, she does not feel like putting them in. Discussed options and cooking methods to make items more feasible for patient.   Informed [name] that this RD will place handouts in Discharge Instructions to outline low sodium diet. Noted that Ensure Enlive was ordered TID on 6/5 but patient has refused all but 1 bottle of this supplement. Will change to Boost Breeze BID (250 kcal, 9 grams protein per supplement) and monitor for tolerance/accpetance. Will also order protstat BID (100 kcal, 15 grams protein per supplement).   Patient is s/p paracentesis on 6/6 with 2.5L removed at that time.     Jarome Matin, MS, RD, LDN, [facility] Inpatient Clinical Dietitian Pager # [phone] After hours/weekend pager # [phone]

## 2019-01-02 NOTE — Progress Notes (Signed)
Patient is stable for discharge. Discharge instructions and medications have been reviewed with the patient and her wife and all questions answered. AVS and prescriptions given to patient.

## 2019-01-02 NOTE — Telephone Encounter (Signed)
Dr. Carlean Purl see the two [place] messages as well. When do you want to see her ?

## 2019-01-02 NOTE — Telephone Encounter (Signed)
Pt was discharged from hospital--pt was advised to follow up with Dr. Carlean Purl this week for cirrhosis and hypokalemia.  No more availability until 6/25.  Please advise scheduling.

## 2019-01-03 LAB — ANTI-SMOOTH MUSCLE ANTIBODY, IGG: F-Actin IgG: 17 Units (ref 0–19)

## 2019-01-03 LAB — IGG: IgG (Immunoglobin G), Serum: 5435 mg/dL — ABNORMAL HIGH (ref 586–1602)

## 2019-01-03 LAB — ANA: Anti Nuclear Antibody (ANA): NEGATIVE

## 2019-01-03 LAB — MITOCHONDRIAL ANTIBODIES: Mitochondrial M2 Ab, IgG: <20 Units (ref 0.0–20.0)

## 2019-01-03 NOTE — Progress Notes (Signed)
Thanks ANA, anti-smooth mm and mito Abs neg

## 2019-01-04 ENCOUNTER — Telehealth: Payer: Self-pay | Admitting: *Deleted

## 2019-01-04 ENCOUNTER — Telehealth: Payer: Self-pay

## 2019-01-04 LAB — CULTURE, BLOOD (ROUTINE X 2)
Culture: NO GROWTH
Culture: NO GROWTH
Special Requests: ADEQUATE

## 2019-01-04 NOTE — Telephone Encounter (Signed)
Covid-19 screening questions  Have you traveled in the last 14 days? If yes where? No Do you now or have you had a fever in the last 14 days? No Do you have any respiratory symptoms of shortness of breath or cough now or in the last 14 days? No Do you have any family members or close contacts with diagnosed or suspected Covid-19 in the past 14 days? No  Have you been tested for Covid-19 and found to be positive? Was tested while in hospital. Negative for Covid-19.(12/29/2018)

## 2019-01-04 NOTE — Telephone Encounter (Signed)
Patient left voice mail requesting results of  Myeloma panel and Kappa/lambda test on 6/4. Call/request routed to Dr. Irene Limbo.

## 2019-01-05 ENCOUNTER — Ambulatory Visit (INDEPENDENT_AMBULATORY_CARE_PROVIDER_SITE_OTHER): Payer: Medicare Other | Admitting: Internal Medicine

## 2019-01-05 ENCOUNTER — Encounter: Payer: Self-pay | Admitting: Internal Medicine

## 2019-01-05 ENCOUNTER — Other Ambulatory Visit: Payer: Self-pay

## 2019-01-05 ENCOUNTER — Other Ambulatory Visit (INDEPENDENT_AMBULATORY_CARE_PROVIDER_SITE_OTHER): Payer: Medicare Other

## 2019-01-05 VITALS — BP 106/62 | Temp 97.4°F | Ht 59.0 in | Wt <= 1120 oz

## 2019-01-05 DIAGNOSIS — R238 Other skin changes: Secondary | ICD-10-CM | POA: Insufficient documentation

## 2019-01-05 DIAGNOSIS — K7031 Alcoholic cirrhosis of liver with ascites: Secondary | ICD-10-CM

## 2019-01-05 DIAGNOSIS — R601 Generalized edema: Secondary | ICD-10-CM | POA: Diagnosis not present

## 2019-01-05 DIAGNOSIS — E43 Unspecified severe protein-calorie malnutrition: Secondary | ICD-10-CM | POA: Diagnosis not present

## 2019-01-05 LAB — BASIC METABOLIC PANEL WITH GFR
BUN: 10 mg/dL (ref 6–23)
CO2: 24 meq/L (ref 19–32)
Calcium: 7.8 mg/dL — ABNORMAL LOW (ref 8.4–10.5)
Chloride: 93 meq/L — ABNORMAL LOW (ref 96–112)
Creatinine, Ser: 0.28 mg/dL — ABNORMAL LOW (ref 0.40–1.20)
GFR: 237.65 mL/min
Glucose, Bld: 115 mg/dL — ABNORMAL HIGH (ref 70–99)
Potassium: 4.6 meq/L (ref 3.5–5.1)
Sodium: 125 meq/L — ABNORMAL LOW (ref 135–145)

## 2019-01-05 LAB — CULTURE, BODY FLUID W GRAM STAIN -BOTTLE: Culture: NO GROWTH

## 2019-01-05 NOTE — Patient Instructions (Addendum)
Good to see you today.  We will check the electrolytes and kidney function and come up with a plan for fluid medicine and possible paracentesis.  I will get a palliative care consult set up for you.  Try to use the Ensure Enlive three times a day.  I appreciate the opportunity to care for you. Gatha Mayer, MD, [facility]

## 2019-01-05 NOTE — Assessment & Plan Note (Addendum)
BMET Protein supplementation and nutrition. As per palliative care consultation through [facility] and care management.  Goals of care consult. Cassidy Barrett was asking about a different wheelchair and one that allows easy movement from the bed to the chair.  I am willing to help with that but I need the assessment of somebody with more expertise regarding that.  If that type of durable medical equipment is required I can sign for it if it is recommended by the appropriate professional.  I have made plans for a follow-up in July, in about a month or so.  Telehealth may work if not we will have her come in.  It is very difficult for her to be transported due to her weakness.  I support DNR.  We talked a bit about hospice but she is not inclined to pursue that yet.  She is a child's class B.  I think many of her problems are worsened by her poor nutritional status.  Lab Results  Component Value Date   CREATININE 0.28 (L) 01/05/2019   BUN 10 01/05/2019   NA 125 (L) 01/05/2019   K 4.6 01/05/2019   CL 93 (L) 01/05/2019   CO2 24 01/05/2019   She is still hyponatremic but not hypokalemic.  It is going to be tricky to use diuretics. ? If she has hepatorenal - I think not but possible

## 2019-01-05 NOTE — Progress Notes (Signed)
Cassidy Barrett 73 y.o. Aug 11, 1945 [id number]  Assessment & Plan:  Alcoholic cirrhosis ([place]) BMET Protein supplementation and nutrition. As per palliative care consultation through [facility] and care management.  Goals of care consult. [name] was asking about a different wheelchair and one that allows easy movement from the bed to the chair.  I am willing to help with that but I need the assessment of somebody with more expertise regarding that.  If that type of durable medical equipment is required I can sign for it if it is recommended by the appropriate professional.  I have made plans for a follow-up in July, in about a month or so.  Telehealth may work if not we will have her come in.  It is very difficult for her to be transported due to her weakness.  I support DNR.  We talked a bit about hospice but she is not inclined to pursue that yet.  She is a child's class B.  I think many of her problems are worsened by her poor nutritional status.  Lab Results  Component Value Date   CREATININE 0.28 (L) 01/05/2019   BUN 10 01/05/2019   NA 125 (L) 01/05/2019   K 4.6 01/05/2019   CL 93 (L) 01/05/2019   CO2 24 01/05/2019   She is still hyponatremic but not hypokalemic.  It is going to be tricky to use diuretics. ? If she has hepatorenal - I think not but possible  Anasarca BMET shows persistent hyponatremia, see cirrhosis.  We will try low-dose furosemide and potassium supplementation.  Albumin low and also a significant contribution to edema problems.  Protein-calorie malnutrition, severe Ensure  Skin fragility Hopefully diuresis will help if I can do that.  Protein stores need to be repleted.  This will be challenging.     CC: Vicenta Aly, FNP Dr. Irene Limbo  Subjective:   Chief Complaint: Fluid retention follow-up of cirrhosis  HPI Cassidy Barrett is here with her wife [name], she was hospitalized due to hypokalemia and hyponatremia and edema and oozing from the skin and was discharged on  June 8.  I had started spironolactone and asked that she hold her potassium prior to that.  She is having difficulty walking, very weak, and fragile skin with easy bruising and skin breakdown.  Edema all over.  She had a paracentesis of 2.5 L when hospitalized (no SBP) and she was diuresed some as well.  There was some labs that were thought to be spurious including an elevated IgG on the day of discharge.  [name] and Cassidy Barrett are asking about possible palliative care.  She would like to be a DNR but does not have a most form yet.  She is not ready to pursue hospice.  She may have finally stopped drinking.  None in several days no signs of DTs.  Significant problems with dependent edema, she had to have her hospital socks cut off due to the constriction after she went home.  Myeloma remains in remission as best they know.  Albumin was 1.8 the day before discharge.  She was seen by dietitian.  Ensure Enliven 3 times a day was recommended.  She is currently using some boost.  Appetite is a little better. Allergies  Allergen Reactions  . Bee Venom Swelling  . Benadryl [Diphenhydramine]     "climbs the walls"  . Hydrocodone-Acetaminophen     "flips me out"  . 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 Oxycodone Other (See  Comments)    Anxiety, restless  . Tape     Skin sensitive to tape  . Tramadol Diarrhea and Other (See Comments)    All listed side effects, except seizures  . Valium [Diazepam] Other (See Comments)    Pt states makes her crazy   Current Meds  Medication Sig  . acyclovir (ZOVIRAX) 400 MG tablet TAKE 1 TABLET(400 MG) BY MOUTH TWICE DAILY (Patient taking differently: Take 400 mg by mouth 2 (two) times daily. )  . albuterol (VENTOLIN HFA) 108 (90 Base) MCG/ACT inhaler INHALE 2 PUFFS INTO THE LUNGS EVERY 6 HOURS AS NEEDED FOR WHEEZING OR SHORTNESS OF BREATH (Patient taking differently: Inhale 2 puffs into the lungs every 6 (six) hours as needed for wheezing or shortness of breath. )  .  ANORO ELLIPTA 62.5-25 MCG/INH AEPB INHALE 1 PUFF INTO THE LUNGS DAILY (Patient taking differently: Inhale 1 puff into the lungs daily. )  . diclofenac sodium (VOLTAREN) 1 % GEL Apply 1 g topically 3 (three) times daily.   . ergocalciferol (VITAMIN D2) 50000 units capsule Take 1 capsule (50,000 Units total) by mouth once a week.  . gabapentin (NEURONTIN) 300 MG capsule Take 1 capsule by mouth 3 (three) times daily.  . Lidocaine 4 % PTCH Place 1 patch onto the skin daily as needed (severe pain). Remove & Discard patch within 12 hours or as directed by MD   . ondansetron (ZOFRAN) 8 MG tablet Take 1 tablet (8 mg total) by mouth 2 (two) times daily as needed (Nausea or vomiting).  Marland Kitchen OVER THE COUNTER MEDICATION Apply 400 mg topically daily as needed (arthritis/back pain). CBD cream  . OVER THE COUNTER MEDICATION MAGNESIUM SPRAY 12MG--SPRAY ON FEET AS NEEDED FOR NEUROPATHY   Past Medical History:  Diagnosis Date  . Alcohol abuse   . Anasarca 05/14/2014  . Arthritis   . Ascites   . Atherosclerosis of aorta ([place])   . Cholelithiasis   . Cirrhosis ([place])   . Complication of anesthesia    Difficult time waking up after having biospy with colonoscopy  . 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 Compression fracture of lumbar vertebra ([facility])     L1 and L2  . Emphysema of lung ([place])   . History of multiple myeloma    smoldering  . Hx of adenomatous polyp of rectum 05/21/2014  . Hyponatremia   . Macrocytic anemia   . 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 Osteoporosis   . Pneumonia 04/2014  . Tobacco abuse   . Tubular adenoma   . Vertebral compression fracture [facility]    Past Surgical History:  Procedure Laterality Date  . COLONOSCOPY N/A 05/24/2014   Procedure: COLONOSCOPY;  Surgeon: Gatha Mayer, MD;  Location: [facility];  Service: Endoscopy;  Laterality: N/A;  MAC if possible ultraslim colonoscope and gastroscope needed  . FLEXIBLE SIGMOIDOSCOPY N/A 05/21/2014    Procedure: FLEXIBLE SIGMOIDOSCOPY;  Surgeon: Gatha Mayer, MD;  Location: [facility];  Service: Endoscopy;  Laterality: N/A;  . KYPHOPLASTY N/A 12/12/2014   Procedure: KYPHOPLASTY;  Surgeon: Phylliss Bob, MD;  Location: [facility];  Service: Orthopedics;  Laterality: N/A;  T10 kyphoplasty  . MANDIBLE FRACTURE SURGERY     X 2  . TONSILLECTOMY     Social History   Social History Narrative   She is retired from [employer]   Married to [employer]   Uses alcohol heavy in the past,   Former smoker quit in 2019  No substance abuse reported   family history includes ALS in her brother; COPD in her sister; Heart disease in her mother; Other in her father.   Review of Systems As per HPI  Objective:   Physical Exam BP 106/62   Temp (!) 97.4 F (36.3 C)   Ht _0  (1.499 m)   Wt 70 lb (31.8 kg)   BMI 14.14 kg/m   Chronically ill malnourished appearing white woman in wheelchair The eyes are anicteric She is alert and oriented x3 The lungs show decreased breath sounds at the bases Heart sounds are distant without murmur The abdomen is protuberant and shows signs of moderate ascites but is nontender The extremities do not show cyanosis or clubbing but there is pitting edema.  Her body is diffusely edematous all the tissues have pitting edema in the lower extremities back area. Skin is purpuric in many areas with tears and breakdown.

## 2019-01-05 NOTE — Assessment & Plan Note (Addendum)
Hopefully diuresis will help if I can do that.  Protein stores need to be repleted.  This will be challenging.

## 2019-01-05 NOTE — Assessment & Plan Note (Signed)
Ensure

## 2019-01-05 NOTE — Telephone Encounter (Signed)
Per Dr. Irene Limbo - inform patient that no abnormal M spikes, she continues to remain in remission from Myeloma.  Attempted to contact patient - left above message on named voice mail.

## 2019-01-05 NOTE — Assessment & Plan Note (Addendum)
BMET shows persistent hyponatremia, see cirrhosis.  We will try low-dose furosemide and potassium supplementation.  Albumin low and also a significant contribution to edema problems.

## 2019-01-06 ENCOUNTER — Other Ambulatory Visit: Payer: Self-pay | Admitting: Internal Medicine

## 2019-01-06 ENCOUNTER — Other Ambulatory Visit: Payer: Medicare Other

## 2019-01-06 ENCOUNTER — Other Ambulatory Visit: Payer: Self-pay

## 2019-01-06 MED ORDER — FUROSEMIDE 20 MG PO TABS: 20.0000 mg | ORAL_TABLET | Freq: Every day | ORAL | 1 refills | Status: AC

## 2019-01-06 NOTE — Patient Outreach (Signed)
[place]) Care Management  01/06/2019  Cassidy Barrett [birth year] [id number]  TELEPHONE SCREENING Referral date: 01/06/19 Referral source: primary MD referral Referral reason:  Post hospital discharge  Insurance: Medicare  Attempt #1  Telephone call to patient regarding primary MD referral. Unable to reach. HIPAA compliant voice message left with call back phone number.   PLAN: RNCM will attempt 2nd telephone call to patient within 4 business days.  RNCM will send outreach letter to attempt contact  Quinn Plowman RN,BSN,CCM [facility] Telephonic  [phone]

## 2019-01-06 NOTE — Progress Notes (Signed)
Discussed w/ wife [name]  Will start furosemide 20 mg daily (Rx sent) and potassium 20 meQ daily  Will need another BMET in about 1 week - hopefully she will get set up so that we can do at home if not will need to come for labs  [name] - as we discussed please let me know what comes of the [facility] referral

## 2019-01-10 ENCOUNTER — Telehealth: Payer: Self-pay

## 2019-01-10 NOTE — Telephone Encounter (Signed)
Stacy from [employer] called [phone]) to let us know that [name] (Antionetta's spouse) reached out to them for care. They work with the [facility] palliative care group that we had connected last week about caring for [place]. Just an fyi call they don't need any forms signed.

## 2019-01-11 ENCOUNTER — Other Ambulatory Visit: Payer: Self-pay

## 2019-01-11 ENCOUNTER — Other Ambulatory Visit: Payer: Medicare Other | Admitting: Licensed Clinical Social Worker

## 2019-01-11 DIAGNOSIS — Z515 Encounter for palliative care: Secondary | ICD-10-CM

## 2019-01-12 ENCOUNTER — Other Ambulatory Visit: Payer: Medicare Other | Admitting: Licensed Clinical Social Worker

## 2019-01-12 ENCOUNTER — Telehealth: Payer: Self-pay | Admitting: *Deleted

## 2019-01-12 ENCOUNTER — Other Ambulatory Visit: Payer: Self-pay

## 2019-01-12 ENCOUNTER — Other Ambulatory Visit: Payer: Medicare Other | Admitting: *Deleted

## 2019-01-12 DIAGNOSIS — Z515 Encounter for palliative care: Secondary | ICD-10-CM

## 2019-01-12 NOTE — Telephone Encounter (Signed)
Hinton Dyer, RN with [facility] LVM: CB# [phone] requesting a verbal order for wound care. Stated she would fax over orders to be signed with specific requirements.  Attempted to contact Hinton Dyer: LVM on named voice mail. Gave office fax [phone] and requested orders be faxed to office for for signature

## 2019-01-12 NOTE — Progress Notes (Signed)
COMMUNITY PALLIATIVE CARE SW NOTE  PATIENT NAME: Cassidy Barrett DOB: 1946-07-15 MRN: [id number]  PRIMARY CARE PROVIDER: Vicenta Aly, FNP  RESPONSIBLE PARTY:  Acct ID - Guarantor Home Phone Work Phone Relationship Acct Type  [id number] Virginia [phone]  Self P/F     [place], [place], [place] [address]   Due to the COVID-19 crisis, this virtual check-in visit was done via telephone from my office and it was initiated and consent given by this patientand or family.   PLAN OF CARE and INTERVENTIONS:             1. GOALS OF CARE/ ADVANCE CARE PLANNING:  Goal is for patient to remain in her home.  She was a DNR in the hospital. 2. SOCIAL/EMOTIONAL/SPIRITUAL ASSESSMENT/ INTERVENTIONS:  SW conducted a virtual check in visit with patient's wife, Gerda Diss.  SW provided active listening and supportive counseling while she discussed her concerns for patient.  Rema's sister died six years ago and [name] said she has not recovered.  Patient became incontinent two weeks ago.  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  [name] has discussed Hospice with patient and patient feels it means death.  A Zoom meeting is scheduled tomorrow at 9:30 with [name], patient and Palliative Care RN, Daryl Eastern.  3. PATIENT/CAREGIVER EDUCATION/ COPING:  [name] expresses her feelings openly. 4. PERSONAL EMERGENCY PLAN:  [name] will contact patient's MD. 5. COMMUNITY RESOURCES COORDINATION/ HEALTH CARE NAVIGATION:  Patient is currently receiving home health. 6. FINANCIAL/LEGAL CONCERNS/INTERVENTIONS:  None.     SOCIAL HX:  Social History   Tobacco Use  . Smoking status: Current Every Day Smoker    Packs/day: 0.25    Years: 46.00    Pack years: 11.50    Types: Cigarettes    Last attempt to quit: 02/24/2017    Years since quitting: 1.8  . Smokeless tobacco: Never Used  . Tobacco comment: hemp cigs-no tabacco in them  Substance Use  Topics  . Alcohol use: Yes    Alcohol/week: 0.0 standard drinks    Comment: 3    CODE STATUS:  DNR  ADVANCED DIRECTIVES:  Y MOST FORM COMPLETE:  N HOSPICE EDUCATION PROVIDED: Y PPS:  [name] appetite has decreased.  Patient can no longer walk. Duration of visit and documentation:  30 minutes.      Creola Corn Stetson Pelaez, LCSW

## 2019-01-12 NOTE — Progress Notes (Signed)
COMMUNITY PALLIATIVE CARE RN NOTE  PATIENT NAME: Cassidy Barrett DOB: 09-Feb-1946 MRN: [id number]  PRIMARY CARE PROVIDER: Vicenta Aly, FNP  RESPONSIBLE PARTY:  Acct ID - Guarantor Home Phone Work Phone Relationship Acct Type  [id number] Cassidy Crews450 [phone]  Self P/F     [place], [place], [place] [address]   Due to the COVID-19 crisis, this visit was done via telemedicine from my office and it was initiated and consent by this patient and or family.  PLAN OF CARE and INTERVENTION:  1. ADVANCE CARE PLANNING/GOALS OF CARE: Patient wants to remain in her home. She is a DNR. 2. PATIENT/CAREGIVER EDUCATION: Explained Palliative Care Services 3. DISEASE STATUS: Joint telehealth visit completed with Palliative Care SW, Cassidy Barrett. Patient and her wife Cassidy Barrett present. Patient lying in bed and is awake and alert. She is able to engage in appropriate conversation. She reports neuropathic pain in both legs and feet. She is currently taking Gabapentin 300 mg every 8 hours which she says helps to take the edge off. She is bedbound. She requires assistance with all ADLs except feeding. She says her intake is improving, but she can only eat a small amount at a time, d/t early satiety. Her wife is concerned with patient's nutritional status and protein intake d/t patient having an infected wound located beneath her L1. Home health RN to visit today to reassess area and has new wound care orders that she will implement today. She is also working with Physical Therapy. Patient has an alternating pressure mattress and pump coming in the next day or so and also has a cushion being delivered in about 2 weeks to place in her recliner. She is turned and repositioned every 1-2 hours. She is drinking Enlive, and also [employer] for nutritional supplementation. Patient does not do well with milk based products. Wife also says that patient has been retaining fluid in her back and abdomen, but this has seemed to  improve since yesterday. Patient became incontinent about 2 weeks ago and was only urinating once daily and only when given Lasix. Yesterday she urinated 3 times. Wife is hoping urination continues to prevent need for a foley catheter d/t retention. Patient does not feel that she can go to any more doctor appointments as it is too much for her and painful with movement. She agrees to future Palliative Care visits. Will continue to monitor.  HISTORY OF PRESENT ILLNESS:  This is a 73 yo female who resides at home. Palliative Care Team asked to follow patient for added support. Will visit with patient weekly and PRN.  CODE STATUS: DNR ADVANCED DIRECTIVES: Y (HCPOA, Living Will) MOST FORM: reviewed during visit. Patient chooses Limited Interventions, IV fluids for a defined trial period, Antibiotics on a case by case basis and no feeding tube. SW to mail original copy to patient once signed by NP PPS: 30%  (Duration of visit and documentation 60 minutes)   Daryl Eastern, RN BSN

## 2019-01-13 ENCOUNTER — Telehealth: Payer: Self-pay | Admitting: Internal Medicine

## 2019-01-13 ENCOUNTER — Other Ambulatory Visit: Payer: Self-pay

## 2019-01-13 DIAGNOSIS — E43 Unspecified severe protein-calorie malnutrition: Secondary | ICD-10-CM

## 2019-01-13 DIAGNOSIS — K7031 Alcoholic cirrhosis of liver with ascites: Secondary | ICD-10-CM

## 2019-01-13 NOTE — Patient Outreach (Signed)
[facility]) Care Management  01/13/2019  [name] 1946/06/09 [id number]  TELEPHONE SCREENING Referral date: 01/06/19 Referral source: primary MD referral Referral reason:  Post hospital discharge  Insurance: Medicare  Telephone call to patient's designated party release/ spouse, Cassidy Barrett. HIPAA verified by spouse for patient.  RNCM introduced herself and explained reason for call.  RNCM discussed and offered [facility] care management services. Spouse states Palliative care has set patient up for services. Spouse states patient will have follow up with nurse and Education officer, museum.  Spouse states she does not see a need for [facility] care management services at this time but would like to be able to contact [facility] if and when needed.  RNCM offered to mail patient [facility] care management outreach letter/ brochure. Spouse verbally agreed and confirmed patients mailing address.  PLAN:  RNCM will close case due to refusal of services.  RNCM will mail patient [facility] care management brochure / magnet  Quinn Plowman RN,BSN,CCM [facility] Telephonic  [phone]

## 2019-01-13 NOTE — Telephone Encounter (Signed)
Gerda Diss called to inform that [facility] has not received orders for labs. Fax # [phone]

## 2019-01-13 NOTE — Progress Notes (Signed)
COMMUNITY PALLIATIVE CARE SW NOTE  PATIENT NAME: Cassidy Barrett DOB: Oct 11, 1945 MRN: [id number]  PRIMARY CARE PROVIDER: Vicenta Aly, FNP  RESPONSIBLE PARTY:  Acct ID - Guarantor Home Phone Work Phone Relationship Acct Type  [id number] [place] [phone]  Self P/F     [address], [place], [place] [address]   Due to the COVID-19 crisis, this virtual check-in visit was done via telephone from my office and it was initiated and consent given by this patientand or family.  PLAN OF CARE and INTERVENTIONS:             1. GOALS OF CARE/ ADVANCE CARE PLANNING:  Goal is for patient to remain in her home.  She is a DNR. 2. SOCIAL/EMOTIONAL/SPIRITUAL ASSESSMENT/ INTERVENTIONS:  SW and Palliative Care RN, Daryl Eastern, conducted a Zoom meeting with patient and her spouse, [name].  Patient complained of her feet "bothering" her.  She is always able to swallow her medications and does not have breathing issues.  Patient spends her time sleeping or on facebook.  Reviewed the MOST form. 3. PATIENT/CAREGIVER EDUCATION/ COPING:  Patient and her spouse express their feelings openly. 4. PERSONAL EMERGENCY PLAN:  [name] MD will be contacted. 5. COMMUNITY RESOURCES COORDINATION/ HEALTH CARE NAVIGATION:  None. 6. FINANCIAL/LEGAL CONCERNS/INTERVENTIONS:  None.     SOCIAL HX:  Social History   Tobacco Use  . Smoking status: Current Every Day Smoker    Packs/day: 0.25    Years: 46.00    Pack years: 11.50    Types: Cigarettes    Last attempt to quit: 02/24/2017    Years since quitting: 1.8  . Smokeless tobacco: Never Used  . Tobacco comment: hemp cigs-no tabacco in them  Substance Use Topics  . Alcohol use: Yes    Alcohol/week: 0.0 standard drinks    Comment: 3    CODE STATUS:  DNR  ADVANCED DIRECTIVES:  LW, HCPOA MOST FORM COMPLETE:  Pending HOSPICE EDUCATION PROVIDED: Y PPS:  [name] appetite is normal per patient.  She cannot stand. Duration of visit and documentation:  60  minutes.      Creola Corn Qasim Diveley, LCSW

## 2019-01-16 ENCOUNTER — Telehealth: Payer: Self-pay | Admitting: *Deleted

## 2019-01-16 NOTE — Telephone Encounter (Signed)
[name] will call me back , she wasn't home.

## 2019-01-16 NOTE — Telephone Encounter (Signed)
Spoke to patient spouse today.  Now that home health is in place will order blood work to include a CMET and a pre-albumin.  I believe we can fax these orders to the home health agency for them to draw and report back to me.  Diagnoses are alcoholic cirrhosis with ascites and severe protein calorie malnutrition  Patient spouse asking about whether or not patient can have TPN because she is not eating much at all.  This can be considered.  I need to reassess the overall situation however.  She is having difficulty in getting adequate oral nutrition and despite trying to take supplements.  Nasoenteric tube for nutrition is a possible option though spouse indicates she is concerned about the "size of her stomach".

## 2019-01-16 NOTE — Telephone Encounter (Signed)
Faxed orders signed by Dr. Irene Limbo for wound care to [facility] (Case Manager) w/[place]. [phone]. Fax confirmation received

## 2019-01-17 DIAGNOSIS — D630 Anemia in neoplastic disease: Secondary | ICD-10-CM | POA: Diagnosis not present

## 2019-01-17 DIAGNOSIS — E43 Unspecified severe protein-calorie malnutrition: Secondary | ICD-10-CM | POA: Diagnosis not present

## 2019-01-17 NOTE — Telephone Encounter (Signed)
I have left a detailed message with [facility] phone# [phone] to call and let me know how I can pass these orders on, fax or verbally.

## 2019-01-17 NOTE — Telephone Encounter (Signed)
Cassidy Barrett from [facility] called and took the lab orders verbally and also [name] (wife) came by Technical sales engineer and got a copy of them.

## 2019-01-21 ENCOUNTER — Encounter: Payer: Self-pay | Admitting: Hematology

## 2019-01-24 ENCOUNTER — Telehealth: Payer: Self-pay | Admitting: Internal Medicine

## 2019-01-24 ENCOUNTER — Telehealth: Payer: Self-pay | Admitting: *Deleted

## 2019-01-24 MED ORDER — CIPROFLOXACIN HCL 500 MG PO TABS: 500.0000 mg | ORAL_TABLET | Freq: Two times a day (BID) | ORAL | 0 refills | Status: AC

## 2019-01-24 NOTE — Telephone Encounter (Signed)
Patient wife lisa called in and stated that patient has pain right side, temp 99.5,stomach hot,nausea,little vomiting/she is wanting a urgent appointment ASAP she feels it may be a gallbladder attack.

## 2019-01-24 NOTE — Telephone Encounter (Signed)
I spoke to [facility] Patient does not want to go to the hospital  I think SBP is possible  We have decided to try empiric antibiotics given her poor health and reluctance to be seen in hospital. "She would rather go on to hospice than go to hospital again"  I have sent in an Rx for cipro  [name] will keep [place] posted

## 2019-01-24 NOTE — Telephone Encounter (Signed)
Contacted by Gerda Diss (spouse) with update on patient. Per Ms. Elnoria Howard, patient is essentially bed bound and will not be able to make it to her appt on 7/2. States patient has developed an infection of peritoneal fluid/ascites and that Dr. Carlean Purl is treating her with Cipro for it. Dr. Carlean Purl had initially wanted patient to go to ED today to have abdominal paracentesis to remove fluid for testing, but patient declined to go to ED and asked to be treated at home.  Dr. Irene Limbo informed. He stated appts could be postponed for 2 months while other concerns are managed.  Contacted Ms. Elnoria Howard with this information. She verbalized understanding and stated she wanted to cancel all pt appts for July. July appts cancelled - next appt is 8/27.

## 2019-01-24 NOTE — Telephone Encounter (Signed)
[name] wife reports that she started with RUQ pain last night "right at the base of her ribcage", vomiting after a meal.  She remains nauseous today.  Low grade temp 99.5.  She reports patient "just feels awful".  Dr. Carlean Purl they are requesting an appt. They are worried that this is her gallbladder.  Had sludge in the gallbladder, but patient was not a surgical candidate according to the wife.   NO openings with you or APP.  Please advise

## 2019-01-25 ENCOUNTER — Telehealth: Payer: Self-pay | Admitting: *Deleted

## 2019-01-25 NOTE — Telephone Encounter (Signed)
Contacted by Mallie Snooks, PT Assoc with [facility]. She is obligated to notify MD that patient is ordered to have PT 2 x week. She has cancelled second PT appt this week due to having an infection.

## 2019-01-26 ENCOUNTER — Other Ambulatory Visit: Payer: Self-pay

## 2019-01-26 ENCOUNTER — Other Ambulatory Visit: Payer: Medicare Other | Admitting: *Deleted

## 2019-01-26 ENCOUNTER — Inpatient Hospital Stay: Payer: Medicare Other | Admitting: Hematology

## 2019-01-26 ENCOUNTER — Telehealth: Payer: Self-pay

## 2019-01-26 ENCOUNTER — Inpatient Hospital Stay: Payer: Medicare Other

## 2019-01-26 ENCOUNTER — Ambulatory Visit: Payer: Self-pay

## 2019-01-26 DIAGNOSIS — Z515 Encounter for palliative care: Secondary | ICD-10-CM | POA: Diagnosis not present

## 2019-01-26 NOTE — Telephone Encounter (Signed)
Received call from palliative care patient has decided she is ready for Hospice.  Dr. Ammie Ferrier has provided the order and is willing to be the attending MD.

## 2019-01-27 ENCOUNTER — Telehealth: Payer: Self-pay | Admitting: Gastroenterology

## 2019-01-27 MED ORDER — ONDANSETRON HCL 8 MG PO TABS: 4.0000 mg | ORAL_TABLET | Freq: Three times a day (TID) | ORAL | 0 refills | Status: AC | PRN

## 2019-01-27 NOTE — Telephone Encounter (Signed)
Cassidy Barrett,    Wife of your patient,who is about to be on home hospice for ESLD (when hospice services come out Sun AM),called me through answering service late eve tonight.  Cassidy Barrett had multiple episodes of what sounded like coffee grounds emesis late afternoon and into evening.     Sounded like possible UGIB in setting of cirrhosis.  I recommended ED evaluation, but she declined.  They asked for "some treatment".  Explained limitations of what I could offer over phone, which they accepted.  She reportedly felt strongly about not seeking care in ED or inpatient stay since she desired home hospice.  She has prilosec OTC at home, so I advised two of those twice daily.  She was nearly out of her ondansetron, so I refilled her 8mg  dose to take one every 8 hrs as needed.  I reviewed your recent office and phone notes.  Perhaps nursing could call Monday to check up.

## 2019-01-28 ENCOUNTER — Telehealth: Payer: Self-pay | Admitting: Physician Assistant

## 2019-01-28 NOTE — Telephone Encounter (Signed)
She was entering hospic as of Friday. Thanks

## 2019-01-28 NOTE — Telephone Encounter (Signed)
Gerda Diss called this a.m. again about Cassidy Barrett.  Apparently she had several episodes of vomiting throughout the night.  Some of the fluid was dark brown and one episode which was a larger amount reported as green No obvious blood.  She has been able to keep down PPI and Zofran this morning.  No fever or chills and no complaints of abdominal pain.  - says concerned about possibility of gallbladder infection.  Patient does not want to come to the emergency room for evaluation. Advised to continue Cipro 500 mg p.o. twice daily, no reason to change antibiotics at this point. It is possible that Cipro is contributing to nausea and vomiting.  Advised to continue Zofran around-the-clock to 8 hours.  Have her sip on liquids, and try to get something in her stomach i.e. crackers etc. prior to dosing antibiotics.

## 2019-01-30 NOTE — Progress Notes (Signed)
COMMUNITY PALLIATIVE CARE RN NOTE  PATIENT NAME: Cassidy Barrett DOB: 09-Apr-1946 MRN: [id number]  PRIMARY CARE PROVIDER: Vicenta Aly, FNP  RESPONSIBLE PARTY: Gerda Diss (Wife) Acct ID - Guarantor Home Phone Work Phone Relationship Acct Type  [id number] [facility]* [phone]  Self P/F     [place], [place], [place] [address]   Covid-19 Pre-screening Negative  PLAN OF CARE and INTERVENTION:  1. ADVANCE CARE PLANNING/GOALS OF CARE: Goal is for patient to remain in her home and avoid hospitalizations. She is a DNR. 2. PATIENT/CAREGIVER EDUCATION: Hospice Education, Pain Management 3. DISEASE STATUS: Met with patient and her wife, Cassidy Barrett, in their home. Patient lying in bed awake and alert. She is able to engage in appropriate conversation. She continues with bilateral lower extremity neuropathic pain, especially when touched or moved. She continues to take Gabapentin 300 mg po TID which helps some, along with PRN Tylenol. She also has Morphine IR 15 mg tabs available, however patient does not take this. Patient was recently placed on Cipro BID 2 days ago d/t suspected Spontaneous Bacterial Peritonitis. Patient was experiencing some abdominal pain on her R side, nausea and vomiting x 1, along with low grade fever of 99.5. Her GI doctor initially felt that patient should go to the ED and receive IV antibiotics, but patient refused. Patient states that she would "rather have hospice than go to the hospital." Wife also expressed concerns that patient would have to receive a port to get IV antibiotics because it is difficult to obtain IV access. Patient is total care for all ADLs except feeding. She is non-ambulatory. She spends the majority of her day lying in bed. She is incontinent of both bowel and bladder. She has a Purewick urinary collection system. Her intake is very poor, mainly only taking in bites and sips. She is very thin, frail. Last noted weight 70 lbs. BMI 14.13. She does have some  edema in her bilateral lower extremities. She has a Stage 2 pressure ulcer to her sacrum that is healing. Currently covered with a duoderm dressing. She currently has a hospital bed with trapeze bar and an alternating pressure mattress and pump. Spoke with patient regarding hospice services. Patient and wife are agreeable with switching from Palliative to Hospice care. Contacted our Medical Director, Dr, Konrad Dolores, who agreed that patient is eligible for hospice services. Contacted Dr. Celesta Aver office and received a verbal order for a hospice consult and that he agrees to be the Attending of Record for hospice once patient is admitted. Contacted Dr. Konrad Dolores to advise of verbal order and he will send information to our Referral Center. Hospice admission visit scheduled for 01/29/19 at 10a. Wife contacted and made aware.  HISTORY OF PRESENT ILLNESS:  This is a 73 yo female who resides at home with her wife and hired caregiver. Palliative Care Team following patient for added support. Patient to be admitted to hospice services on 7/5.   CODE STATUS: DNR ADVANCED DIRECTIVES: Y MOST FORM: delivered signed MOST form to patient today PPS: 30%   PHYSICAL EXAM:   LUNGS: clear to auscultation  CARDIAC: Cor RRR EXTREMITIES: Trace edema bilateral lower extremities SKIN: Thin, frail skin. Stage 2 to sacrum (covered with duoderm)  NEURO: Alert and oriented x 3, forgetful, generalized weakness, non-ambulatory   (Duration of visit and documentation 120 minutes)    Daryl Eastern, RN BSN

## 2019-02-07 ENCOUNTER — Telehealth: Payer: Self-pay | Admitting: *Deleted

## 2019-02-07 NOTE — Telephone Encounter (Signed)
Late entry: Faxed signed orders to [facility] for speech therapy. Fax confirmation received

## 2019-02-14 ENCOUNTER — Telehealth: Payer: Self-pay | Admitting: Internal Medicine

## 2019-02-14 NOTE — Telephone Encounter (Signed)
Estill Bamberg from [facility] stated that she had dropped of a death certificate to be signed 7/16 and was checking that status.

## 2019-02-14 NOTE — Telephone Encounter (Signed)
Estill Bamberg notified that certificate is up front to pick up.  Dr. Carlean Purl has been out of the office.

## 2019-02-23 ENCOUNTER — Other Ambulatory Visit: Payer: Self-pay

## 2019-02-23 ENCOUNTER — Ambulatory Visit: Payer: Self-pay

## 2019-02-25 DEATH — deceased

## 2019-03-23 ENCOUNTER — Ambulatory Visit: Payer: Self-pay

## 2019-03-23 ENCOUNTER — Other Ambulatory Visit: Payer: Self-pay

## 2019-04-05 ENCOUNTER — Ambulatory Visit: Payer: Self-pay | Admitting: Internal Medicine

## 2020-01-03 IMAGING — US US ABDOMEN COMPLETE
1 series · 13 of 25 positions shown · non-contrast
Comparison: CT chest, CT chest 06/01/2017.

CLINICAL DATA: History of alcoholic cirrhosis with ascites.

EXAM:
ABDOMEN ULTRASOUND COMPLETE

[Series 1: us abdomen complete · 0.20mm/px · 13 of 88 slices shown]
[im 1/88]
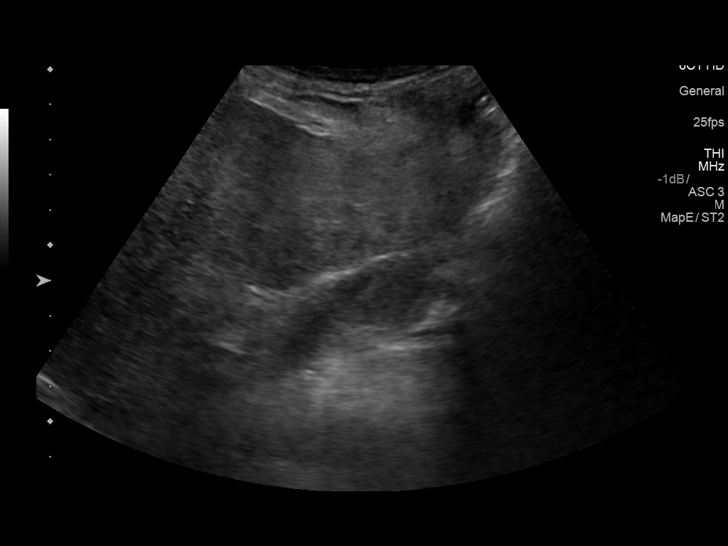
[im 8/88]
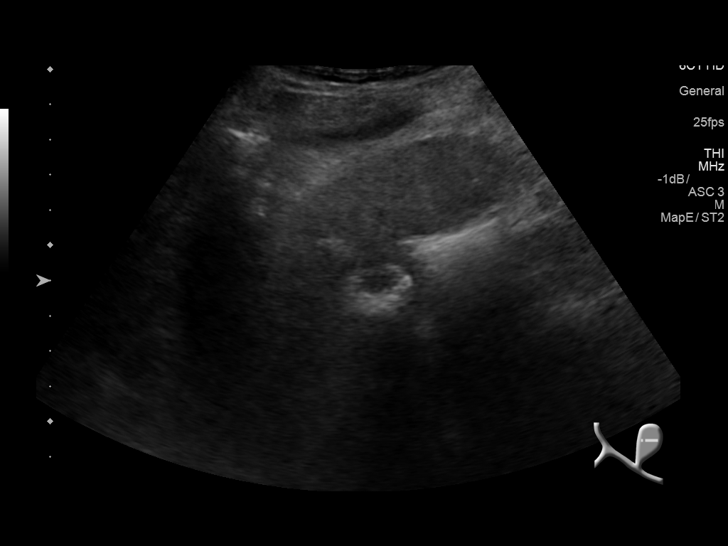
[im 15/88]
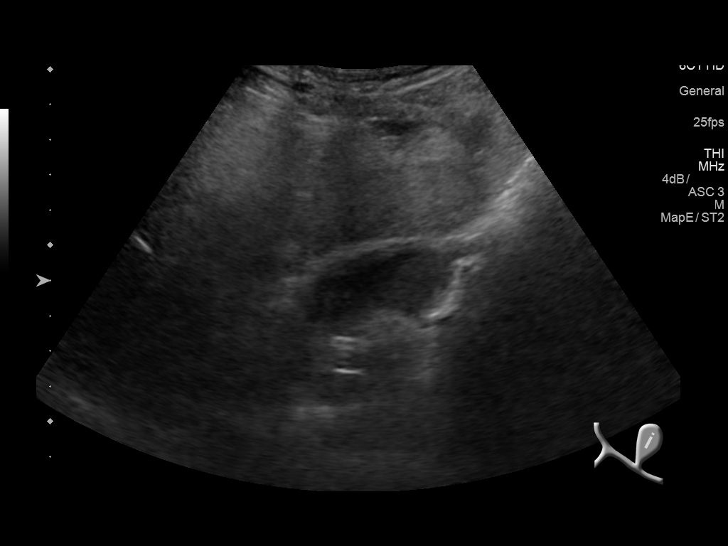
[im 22/88]
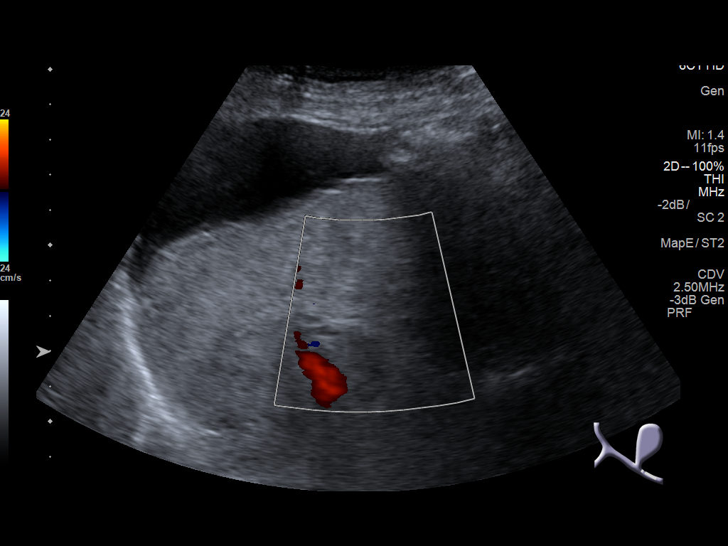
[im 30/88]
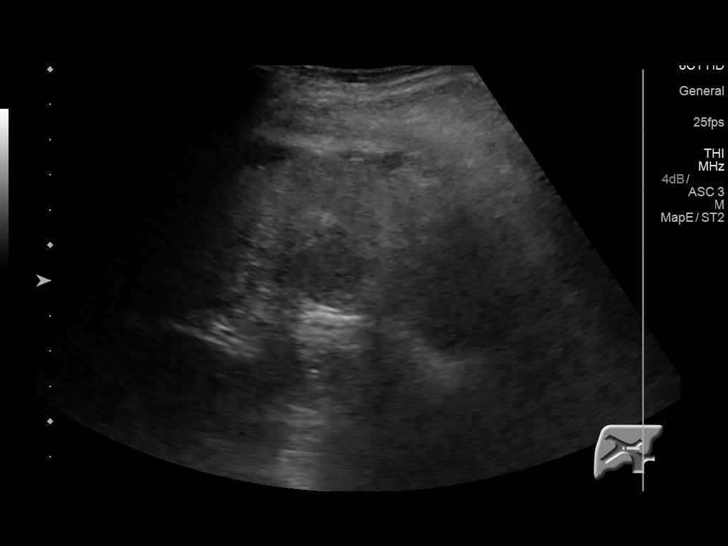
[im 37/88]
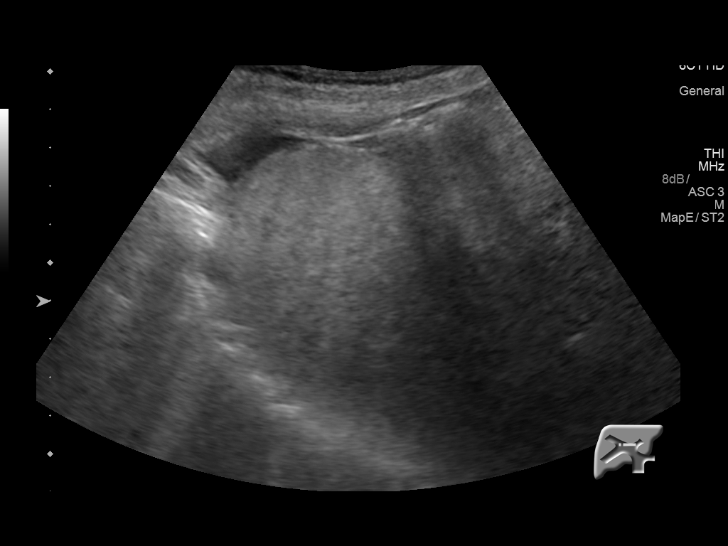
[im 44/88]
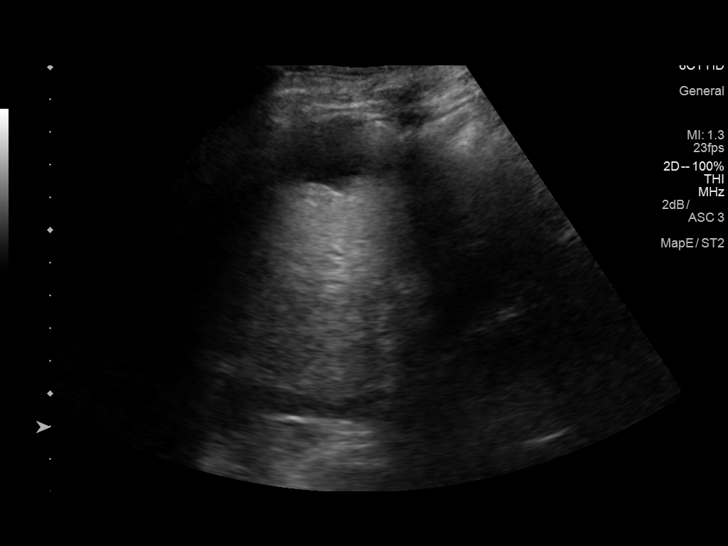
[im 51/88]
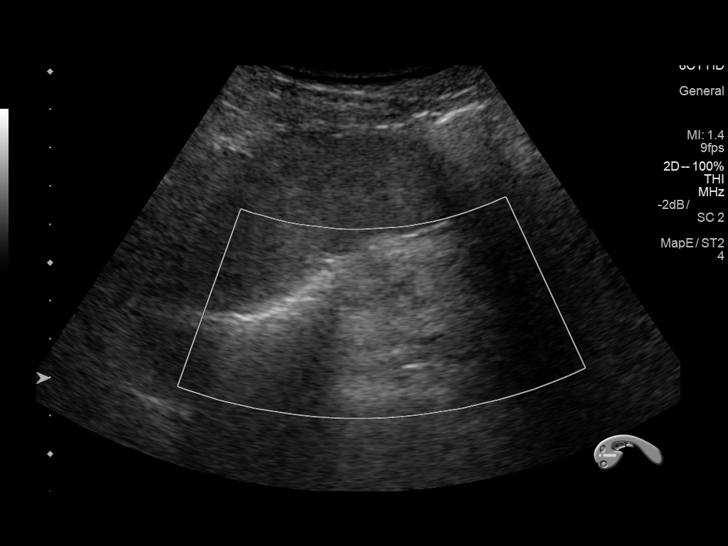
[im 59/88]
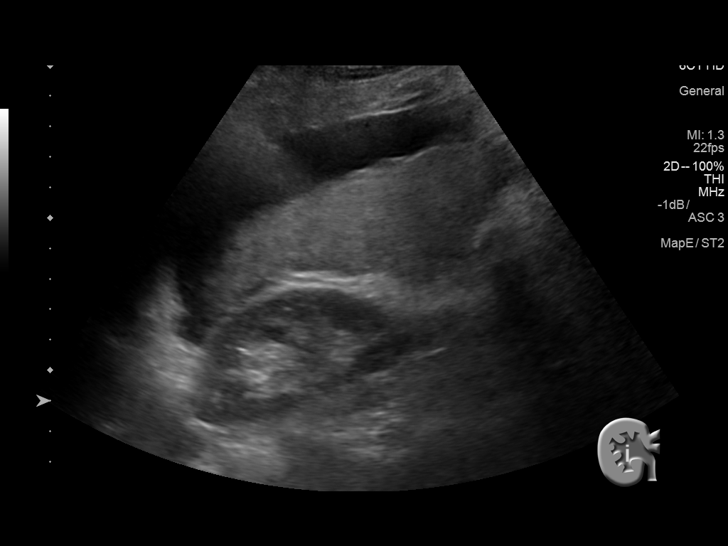
[im 66/88]
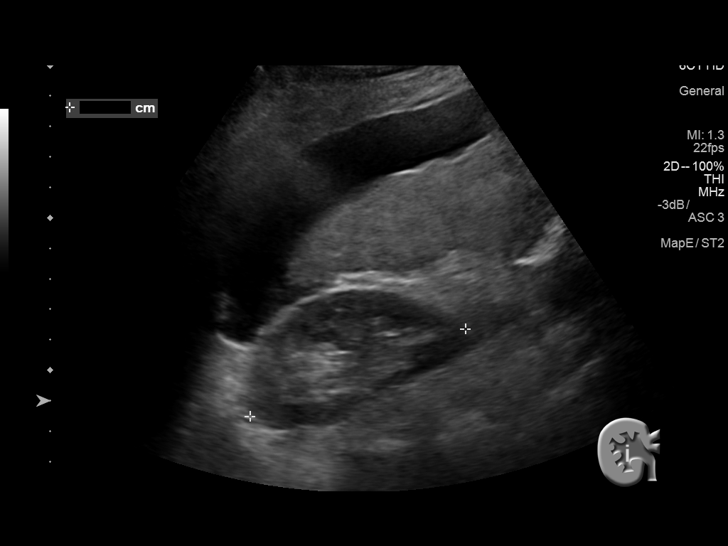
[im 73/88]
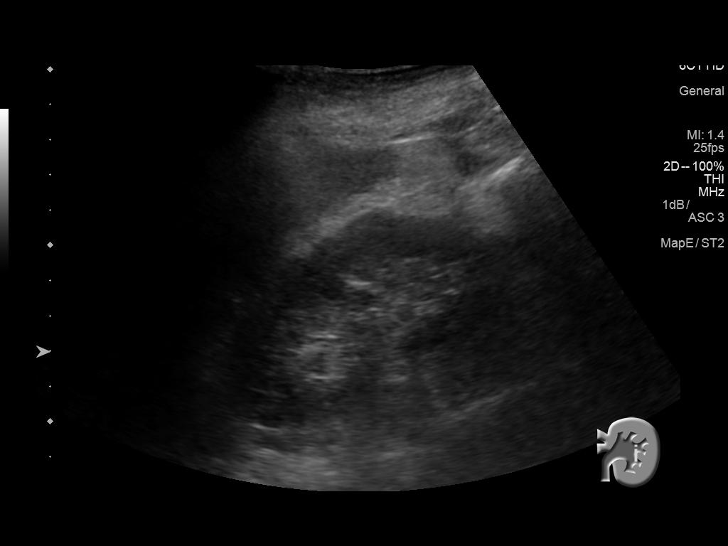
[im 80/88]
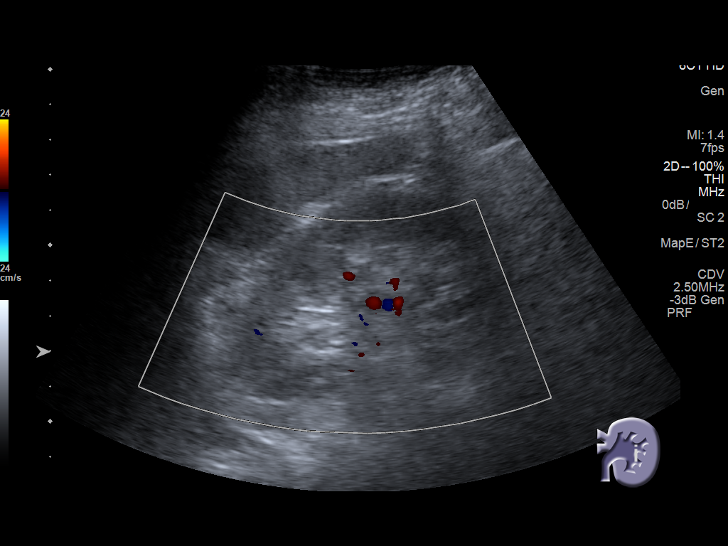
[im 88/88]
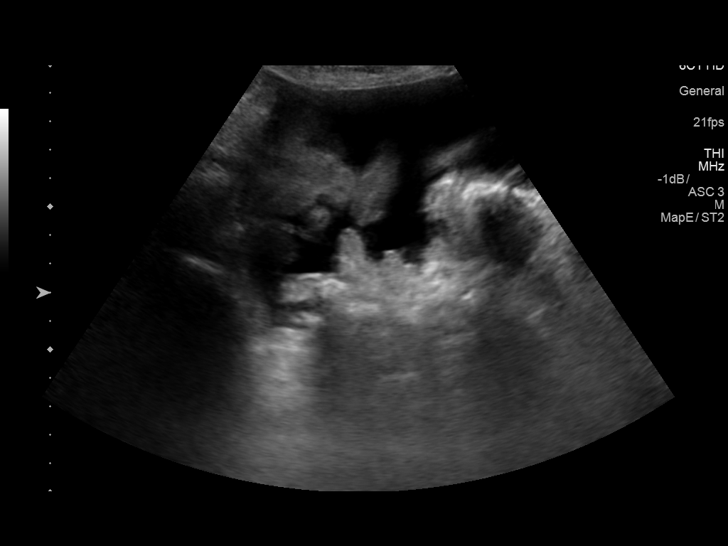

[13 of 25 positions shown; findings below may reference images not displayed]

FINDINGS: Gallbladder: Sludge is seen in the gallbladder and there is a stone
measuring 1.4 cm. No wall thickening or pericholecystic fluid.
Sonographer reports negative Murphy's sign.

Common bile duct: Diameter: 0.3 cm

Liver: No focal lesion is identified. Nodular liver border is
identified and the liver appears shrunken. There is some perihepatic
ascites. No focal lesion. Portal vein is patent on color Doppler
imaging with normal direction of blood flow towards the liver.

IVC: No abnormality visualized.

Pancreas: Visualized portion unremarkable.

Spleen: Size and appearance within normal limits. There is a small
volume of perisplenic ascites.

Right Kidney: Length: 8.1 cm. Echogenicity within normal limits. No
mass or hydronephrosis visualized.

Left Kidney: Length: 9.7 cm. Echogenicity within normal limits. No
mass or hydronephrosis visualized.

Abdominal aorta: No aneurysm visualized.

Other findings: Left pleural effusion is noted.
IMPRESSION: Cirrhotic liver with an associated small to moderate volume of
abdominal ascites. No focal liver lesion.

Gallstone and gallbladder sludge without evidence of cholecystitis.

Left pleural effusion.
